# Patient Record
Sex: Female | Born: 1975
Health system: Southern US, Community
[De-identification: ages and names within clinical notes are randomized; demographics above are authoritative.]

## PROBLEM LIST (undated history)

## (undated) DIAGNOSIS — F329 Major depressive disorder, single episode, unspecified: Secondary | ICD-10-CM

## (undated) DIAGNOSIS — T50902A Poisoning by unspecified drugs, medicaments and biological substances, intentional self-harm, initial encounter: Secondary | ICD-10-CM

## (undated) DIAGNOSIS — E785 Hyperlipidemia, unspecified: Secondary | ICD-10-CM

## (undated) DIAGNOSIS — C801 Malignant (primary) neoplasm, unspecified: Secondary | ICD-10-CM

## (undated) DIAGNOSIS — F419 Anxiety disorder, unspecified: Secondary | ICD-10-CM

## (undated) DIAGNOSIS — K219 Gastro-esophageal reflux disease without esophagitis: Secondary | ICD-10-CM

## (undated) DIAGNOSIS — M1990 Unspecified osteoarthritis, unspecified site: Secondary | ICD-10-CM

## (undated) DIAGNOSIS — F32A Depression, unspecified: Secondary | ICD-10-CM

## (undated) DIAGNOSIS — I10 Essential (primary) hypertension: Secondary | ICD-10-CM

## (undated) HISTORY — PX: CHOLECYSTECTOMY: SHX55

## (undated) HISTORY — PX: UPPER GASTROINTESTINAL ENDOSCOPY: SHX188

## (undated) HISTORY — DX: Malignant (primary) neoplasm, unspecified: C80.1

## (undated) HISTORY — DX: Anxiety disorder, unspecified: F41.9

## (undated) HISTORY — DX: Unspecified osteoarthritis, unspecified site: M19.90

## (undated) HISTORY — DX: Gastro-esophageal reflux disease without esophagitis: K21.9

## (undated) HISTORY — DX: Hyperlipidemia, unspecified: E78.5

## (undated) HISTORY — PX: TUBAL LIGATION: SHX77

## (undated) HISTORY — PX: SIGMOIDOSCOPY: SUR1295

---

## 2014-09-16 DIAGNOSIS — I639 Cerebral infarction, unspecified: Secondary | ICD-10-CM

## 2014-09-16 HISTORY — DX: Cerebral infarction, unspecified: I63.9

## 2016-12-18 ENCOUNTER — Other Ambulatory Visit: Payer: Self-pay | Admitting: Internal Medicine

## 2016-12-18 DIAGNOSIS — N6452 Nipple discharge: Secondary | ICD-10-CM

## 2016-12-23 ENCOUNTER — Other Ambulatory Visit: Payer: Self-pay

## 2017-02-06 ENCOUNTER — Ambulatory Visit
Admission: RE | Admit: 2017-02-06 | Discharge: 2017-02-06 | Disposition: A | Discharge status: Home / Self Care | Payer: Medicaid Other | Source: Ambulatory Visit | Attending: Internal Medicine | Admitting: Internal Medicine

## 2017-02-06 ENCOUNTER — Ambulatory Visit
Admission: RE | Admit: 2017-02-06 | Discharge: 2017-02-06 | Disposition: A | Discharge status: Home / Self Care | Payer: Self-pay | Source: Ambulatory Visit | Attending: Internal Medicine | Admitting: Internal Medicine

## 2017-02-06 ENCOUNTER — Other Ambulatory Visit: Payer: Self-pay | Admitting: Internal Medicine

## 2017-02-06 ENCOUNTER — Other Ambulatory Visit: Payer: Self-pay

## 2017-02-06 DIAGNOSIS — N6452 Nipple discharge: Secondary | ICD-10-CM

## 2017-02-06 DIAGNOSIS — N6320 Unspecified lump in the left breast, unspecified quadrant: Secondary | ICD-10-CM

## 2017-02-07 ENCOUNTER — Other Ambulatory Visit: Payer: Self-pay | Admitting: Internal Medicine

## 2017-02-07 DIAGNOSIS — N6320 Unspecified lump in the left breast, unspecified quadrant: Secondary | ICD-10-CM

## 2017-02-11 ENCOUNTER — Ambulatory Visit
Admission: RE | Admit: 2017-02-11 | Discharge: 2017-02-11 | Disposition: A | Discharge status: Home / Self Care | Payer: Medicaid Other | Source: Ambulatory Visit | Attending: Internal Medicine | Admitting: Internal Medicine

## 2017-02-11 DIAGNOSIS — N6320 Unspecified lump in the left breast, unspecified quadrant: Secondary | ICD-10-CM

## 2017-02-28 ENCOUNTER — Emergency Department (HOSPITAL_COMMUNITY): Payer: Medicaid Other

## 2017-02-28 ENCOUNTER — Emergency Department (HOSPITAL_COMMUNITY)
Admission: EM | Admit: 2017-02-28 | Discharge: 2017-02-28 | Disposition: A | Discharge status: Home / Self Care | Payer: Medicaid Other | Attending: Emergency Medicine | Admitting: Emergency Medicine

## 2017-02-28 ENCOUNTER — Encounter (HOSPITAL_COMMUNITY): Payer: Self-pay | Admitting: Emergency Medicine

## 2017-02-28 DIAGNOSIS — I10 Essential (primary) hypertension: Secondary | ICD-10-CM | POA: Diagnosis not present

## 2017-02-28 DIAGNOSIS — R200 Anesthesia of skin: Secondary | ICD-10-CM | POA: Insufficient documentation

## 2017-02-28 DIAGNOSIS — Z7982 Long term (current) use of aspirin: Secondary | ICD-10-CM | POA: Diagnosis not present

## 2017-02-28 DIAGNOSIS — Z79899 Other long term (current) drug therapy: Secondary | ICD-10-CM | POA: Insufficient documentation

## 2017-02-28 HISTORY — DX: Essential (primary) hypertension: I10

## 2017-02-28 LAB — CBC WITH DIFFERENTIAL/PLATELET
Basophils Absolute: 0.1 10*3/uL (ref 0.0–0.1)
Basophils Relative: 0 %
Eosinophils Absolute: 0.4 10*3/uL (ref 0.0–0.7)
Eosinophils Relative: 3 %
HCT: 39.3 % (ref 36.0–46.0)
Hemoglobin: 13.6 g/dL (ref 12.0–15.0)
Lymphocytes Relative: 36 %
Lymphs Abs: 5.1 10*3/uL — ABNORMAL HIGH (ref 0.7–4.0)
MCH: 30.7 pg (ref 26.0–34.0)
MCHC: 34.6 g/dL (ref 30.0–36.0)
MCV: 88.7 fL (ref 78.0–100.0)
Monocytes Absolute: 1 10*3/uL (ref 0.1–1.0)
Monocytes Relative: 7 %
Neutro Abs: 7.8 10*3/uL — ABNORMAL HIGH (ref 1.7–7.7)
Neutrophils Relative %: 54 %
Platelets: 429 10*3/uL — ABNORMAL HIGH (ref 150–400)
RBC: 4.43 MIL/uL (ref 3.87–5.11)
RDW: 14.7 % (ref 11.5–15.5)
WBC: 14.3 10*3/uL — ABNORMAL HIGH (ref 4.0–10.5)

## 2017-02-28 LAB — BASIC METABOLIC PANEL
Anion gap: 9 (ref 5–15)
BUN: 19 mg/dL (ref 6–20)
CO2: 27 mmol/L (ref 22–32)
Calcium: 9.1 mg/dL (ref 8.9–10.3)
Chloride: 102 mmol/L (ref 101–111)
Creatinine, Ser: 1.15 mg/dL — ABNORMAL HIGH (ref 0.44–1.00)
GFR calc Af Amer: >60 mL/min (ref >60)
GFR calc non Af Amer: 58 mL/min — ABNORMAL LOW (ref >60)
Glucose, Bld: 98 mg/dL (ref 65–99)
Potassium: 3.2 mmol/L — ABNORMAL LOW (ref 3.5–5.1)
Sodium: 138 mmol/L (ref 135–145)

## 2017-02-28 NOTE — ED Triage Notes (Signed)
Pt from home with c/o pressure to the back of her head, blurred vision, left arm numbness and tingling in bilateral fingers. Pt states symptoms have been present x 2 weeks. Unremarkable neuro exam; pt appears to have equal sensation and strength in all extremities.

## 2017-02-28 NOTE — ED Provider Notes (Signed)
[facility] DEPT Provider Note    By signing my name below, I, Bea Graff, attest that this documentation has been prepared under the direction and in the presence of Virgel Manifold, MD. Electronically Signed: Bea Graff, ED Scribe. 02/28/17. 5:48 PM.    History   Chief Complaint Chief Complaint  Patient presents with  . Numbness    The history is provided by the patient and medical records. No language interpreter was used.    Alicia Robinson is a 41 y.o. female with PMHx of HTN who presents to the Emergency Department complaining of intermittent left arm tingling that began approximately two weeks ago. She reports associated intermittent numbness of fingers of the bilateral hands, HA, blurred vision and dizziness. She reports it feels as if ants are crawling on BUE. She states the symptoms occur for a few seconds at a time and not always simultaneously. She describes the HA as pressure in the posterior head. She has not taken anything for her symptoms. There are no modifying factors noted. She denies pain in the shoulders or back, fever, chills, nausea, vomiting. 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   Past Medical History:  Diagnosis Date  . Hypertension     There are no active problems to display for this patient.   Past Surgical History:  Procedure Laterality Date  . CHOLECYSTECTOMY    . TUBAL LIGATION      OB History    No data available       Home Medications    Prior to Admission medications   Medication Sig Start Date End Date Taking? Authorizing Provider  aspirin EC 81 MG tablet Take 81 mg by mouth daily.   Yes [provider]  chlorthalidone (HYGROTON) 25 MG tablet Take 25 mg by mouth daily.   Yes [provider]  omeprazole (PRILOSEC) 20 MG capsule Take 20 mg by mouth daily.   Yes [provider]    Family History No family history on file.  Social History Social  History  Substance Use Topics  . Smoking status: Never Smoker  . Smokeless tobacco: Never Used  . Alcohol use Yes     Comment: weekends     Allergies   Patient has no known allergies.   Review of Systems Review of Systems All other systems reviewed and are negative for acute change except as noted in the HPI.   Physical Exam Updated Vital Signs BP 136/77   Pulse 67   Resp 18   SpO2 100%   Physical Exam  Constitutional: She is oriented to person, place, and time. She appears well-developed and well-nourished. No distress.  HENT:  Head: Normocephalic and atraumatic.  Eyes: EOM are normal.  Neck: Normal range of motion. Neck supple.  No nuchal rigidity.  Cardiovascular: Normal rate, regular rhythm and normal heart sounds.   Pulmonary/Chest: Effort normal and breath sounds normal.  Abdominal: Soft. She exhibits no distension. There is no tenderness.  Musculoskeletal: Normal range of motion.  Neurological: She is alert and oriented to person, place, and time. No cranial nerve deficit or sensory deficit. Coordination normal.  Normal finger to nose. No pronator drift.  Skin: Skin is warm and dry.  Psychiatric: She has a normal mood and affect. Judgment normal.  Nursing note and vitals reviewed.    ED Treatments / Results  DIAGNOSTIC STUDIES: Oxygen Saturation is 100% on RA, normal by my interpretation.   COORDINATION OF  CARE: 5:24 PM- Will order labs and CT. Pt verbalizes understanding and agrees to plan.  Medications - No data to display  Labs (all labs ordered are listed, but only abnormal results are displayed) Labs Reviewed  CBC WITH DIFFERENTIAL/PLATELET - Abnormal; Notable for the following:       Result Value   WBC 14.3 (*)    Platelets 429 (*)    Neutro Abs 7.8 (*)    Lymphs Abs 5.1 (*)    All other components within normal limits  BASIC METABOLIC PANEL - Abnormal; Notable for the following:    Potassium 3.2 (*)    Creatinine, Ser 1.15 (*)    GFR  calc non Af Amer 58 (*)    All other components within normal limits    EKG  EKG Interpretation None       Radiology No results found.  Procedures Procedures (including critical care time)  Medications Ordered in ED Medications - No data to display   Initial Impression / Assessment and Plan / ED Course  I have reviewed the triage vital signs and the nursing notes.  Pertinent labs & imaging results that were available during my care of the patient were reviewed by me and considered in my medical decision making (see chart for details).     40yF with intermittent tingling/numbenss in b/l UE. Neuro exam nonfocal. I doubt this is a TIA/CVA or other emergent process.   Final Clinical Impressions(s) / ED Diagnoses   Final diagnoses:  Arm numbness    New Prescriptions New Prescriptions   No medications on file    I personally preformed the services scribed in my presence. The recorded information has been reviewed is accurate. Virgel Manifold, MD.     Virgel Manifold, MD 03/11/17 [phone]

## 2017-02-28 NOTE — Discharge Instructions (Signed)
Your CT of your head today is without any concerning findings. You have a beautiful looking brain. I think the numbness you are having, particularly the "ants crawling" sensation in your L forearm may be from a pinched nerve in your neck. Take NSAIDs (ibuprofen, aleve, naproxen, etc) as needed. Keep your upcoming appointment with your doctor.

## 2017-04-30 ENCOUNTER — Encounter: Payer: Medicaid Other | Admitting: Obstetrics

## 2017-05-01 ENCOUNTER — Encounter: Payer: Medicaid Other | Admitting: Obstetrics

## 2017-07-11 ENCOUNTER — Inpatient Hospital Stay (HOSPITAL_COMMUNITY)
Admission: AD | Admit: 2017-07-11 | Discharge: 2017-07-18 | DRG: 885 | Disposition: A | Discharge status: Home / Self Care | Payer: Medicaid Other | Attending: Psychiatry | Admitting: Psychiatry

## 2017-07-11 ENCOUNTER — Encounter (HOSPITAL_COMMUNITY): Payer: Self-pay | Admitting: *Deleted

## 2017-07-11 DIAGNOSIS — Z915 Personal history of self-harm: Secondary | ICD-10-CM | POA: Diagnosis not present

## 2017-07-11 DIAGNOSIS — E876 Hypokalemia: Secondary | ICD-10-CM | POA: Diagnosis not present

## 2017-07-11 DIAGNOSIS — F419 Anxiety disorder, unspecified: Secondary | ICD-10-CM | POA: Diagnosis present

## 2017-07-11 DIAGNOSIS — I10 Essential (primary) hypertension: Secondary | ICD-10-CM | POA: Diagnosis present

## 2017-07-11 DIAGNOSIS — K219 Gastro-esophageal reflux disease without esophagitis: Secondary | ICD-10-CM | POA: Diagnosis present

## 2017-07-11 DIAGNOSIS — R51 Headache: Secondary | ICD-10-CM | POA: Diagnosis not present

## 2017-07-11 DIAGNOSIS — F323 Major depressive disorder, single episode, severe with psychotic features: Secondary | ICD-10-CM | POA: Diagnosis present

## 2017-07-11 DIAGNOSIS — Z79899 Other long term (current) drug therapy: Secondary | ICD-10-CM

## 2017-07-11 DIAGNOSIS — Z7982 Long term (current) use of aspirin: Secondary | ICD-10-CM | POA: Diagnosis not present

## 2017-07-11 DIAGNOSIS — E785 Hyperlipidemia, unspecified: Secondary | ICD-10-CM | POA: Diagnosis present

## 2017-07-11 DIAGNOSIS — F39 Unspecified mood [affective] disorder: Secondary | ICD-10-CM | POA: Diagnosis not present

## 2017-07-11 DIAGNOSIS — H538 Other visual disturbances: Secondary | ICD-10-CM | POA: Diagnosis not present

## 2017-07-11 DIAGNOSIS — G4700 Insomnia, unspecified: Secondary | ICD-10-CM | POA: Diagnosis present

## 2017-07-11 DIAGNOSIS — F1210 Cannabis abuse, uncomplicated: Secondary | ICD-10-CM | POA: Diagnosis not present

## 2017-07-11 DIAGNOSIS — R450 Nervousness: Secondary | ICD-10-CM | POA: Diagnosis not present

## 2017-07-11 HISTORY — DX: Major depressive disorder, single episode, unspecified: F32.9

## 2017-07-11 HISTORY — DX: Depression, unspecified: F32.A

## 2017-07-11 LAB — COMPREHENSIVE METABOLIC PANEL
ALT: 103 U/L — ABNORMAL HIGH (ref 14–54)
AST: 79 U/L — AB (ref 15–41)
Albumin: 3.7 g/dL (ref 3.5–5.0)
Alkaline Phosphatase: 73 U/L (ref 38–126)
Anion gap: 12 (ref 5–15)
BUN: 17 mg/dL (ref 6–20)
CHLORIDE: 99 mmol/L — AB (ref 101–111)
CO2: 28 mmol/L (ref 22–32)
Calcium: 9.1 mg/dL (ref 8.9–10.3)
Creatinine, Ser: 1.13 mg/dL — ABNORMAL HIGH (ref 0.44–1.00)
GFR, EST NON AFRICAN AMERICAN: 59 mL/min — AB (ref >60)
Glucose, Bld: 118 mg/dL — ABNORMAL HIGH (ref 65–99)
POTASSIUM: 3 mmol/L — AB (ref 3.5–5.1)
SODIUM: 139 mmol/L (ref 135–145)
Total Bilirubin: 0.4 mg/dL (ref 0.3–1.2)
Total Protein: 6.8 g/dL (ref 6.5–8.1)

## 2017-07-11 LAB — LIPID PANEL
CHOLESTEROL: 189 mg/dL (ref 0–200)
HDL: 53 mg/dL (ref >40)
LDL Cholesterol: 99 mg/dL (ref 0–99)
TRIGLYCERIDES: 186 mg/dL — AB (ref <150)
Total CHOL/HDL Ratio: 3.6 RATIO
VLDL: 37 mg/dL (ref 0–40)

## 2017-07-11 LAB — CBC
HEMATOCRIT: 42.4 % (ref 36.0–46.0)
Hemoglobin: 14.5 g/dL (ref 12.0–15.0)
MCH: 30.4 pg (ref 26.0–34.0)
MCHC: 34.2 g/dL (ref 30.0–36.0)
MCV: 88.9 fL (ref 78.0–100.0)
PLATELETS: 553 10*3/uL — AB (ref 150–400)
RBC: 4.77 MIL/uL (ref 3.87–5.11)
RDW: 14.7 % (ref 11.5–15.5)
WBC: 16.2 10*3/uL — AB (ref 4.0–10.5)

## 2017-07-11 LAB — TSH: TSH: 1.374 u[IU]/mL (ref 0.350–4.500)

## 2017-07-11 LAB — HEMOGLOBIN A1C
HEMOGLOBIN A1C: 5.4 % (ref 4.8–5.6)
MEAN PLASMA GLUCOSE: 108.28 mg/dL

## 2017-07-11 LAB — ETHANOL: Alcohol, Ethyl (B): <10 mg/dL (ref <10)

## 2017-07-11 MED ORDER — ASPIRIN EC 81 MG PO TBEC
81.0000 mg | DELAYED_RELEASE_TABLET | Freq: Every day | ORAL | Status: DC
  Administered 2017-07-12 – 2017-07-18 (×7): 81 mg via ORAL
  Filled 2017-07-11 (×11): qty 1

## 2017-07-11 MED ORDER — ATORVASTATIN CALCIUM 20 MG PO TABS
20.0000 mg | ORAL_TABLET | Freq: Every day | ORAL | Status: DC
  Administered 2017-07-11 – 2017-07-17 (×7): 20 mg via ORAL
  Filled 2017-07-11 (×8): qty 1
  Filled 2017-07-11: qty 2

## 2017-07-11 MED ORDER — TRAZODONE HCL 50 MG PO TABS
50.0000 mg | ORAL_TABLET | Freq: Every evening | ORAL | Status: DC | PRN
  Administered 2017-07-11 – 2017-07-17 (×6): 50 mg via ORAL
  Filled 2017-07-11 (×6): qty 1

## 2017-07-11 MED ORDER — HYDROCHLOROTHIAZIDE 25 MG PO TABS
25.0000 mg | ORAL_TABLET | Freq: Every day | ORAL | Status: DC
  Administered 2017-07-12 – 2017-07-18 (×7): 25 mg via ORAL
  Filled 2017-07-11 (×11): qty 1

## 2017-07-11 MED ORDER — PANTOPRAZOLE SODIUM 40 MG PO TBEC
40.0000 mg | DELAYED_RELEASE_TABLET | Freq: Every day | ORAL | Status: DC
  Administered 2017-07-12 – 2017-07-18 (×7): 40 mg via ORAL
  Filled 2017-07-11 (×11): qty 1

## 2017-07-11 MED ORDER — ALUM & MAG HYDROXIDE-SIMETH 200-200-20 MG/5ML PO SUSP: 30.0000 mL | ORAL | Status: DC | PRN

## 2017-07-11 MED ORDER — LAMOTRIGINE 25 MG PO TABS
25.0000 mg | ORAL_TABLET | Freq: Every day | ORAL | Status: DC
  Administered 2017-07-11 – 2017-07-18 (×8): 25 mg via ORAL
  Filled 2017-07-11 (×12): qty 1

## 2017-07-11 MED ORDER — MAGNESIUM HYDROXIDE 400 MG/5ML PO SUSP: 30.0000 mL | Freq: Every day | ORAL | Status: DC | PRN

## 2017-07-11 MED ORDER — ACETAMINOPHEN 325 MG PO TABS
650.0000 mg | ORAL_TABLET | Freq: Four times a day (QID) | ORAL | Status: DC | PRN
  Administered 2017-07-12 – 2017-07-16 (×4): 650 mg via ORAL
  Filled 2017-07-11 (×4): qty 2

## 2017-07-11 MED ORDER — POTASSIUM CHLORIDE CRYS ER 20 MEQ PO TBCR
20.0000 meq | EXTENDED_RELEASE_TABLET | Freq: Two times a day (BID) | ORAL | Status: AC
  Administered 2017-07-11 – 2017-07-13 (×4): 20 meq via ORAL
  Filled 2017-07-11 (×5): qty 1

## 2017-07-11 MED ORDER — BUPROPION HCL ER (XL) 300 MG PO TB24
300.0000 mg | ORAL_TABLET | Freq: Every day | ORAL | Status: DC
  Administered 2017-07-12 – 2017-07-14 (×3): 300 mg via ORAL
  Filled 2017-07-11 (×7): qty 1

## 2017-07-11 MED ORDER — HYDROXYZINE HCL 25 MG PO TABS
25.0000 mg | ORAL_TABLET | Freq: Three times a day (TID) | ORAL | Status: DC | PRN
  Administered 2017-07-11 – 2017-07-16 (×6): 25 mg via ORAL
  Filled 2017-07-11 (×6): qty 1

## 2017-07-11 MED ORDER — ENSURE ENLIVE PO LIQD
237.0000 mL | Freq: Two times a day (BID) | ORAL | Status: DC
  Administered 2017-07-13: 237 mL via ORAL

## 2017-07-11 NOTE — Progress Notes (Signed)
Nursing admission note:  Pt is 41 yr old female walk in today for depression and SI.  She was started on Wellbutrin by PCP in July for depression with slight improvement, but continued to have crying periods and loss of interest in things that she used to enjoy.  States that she feels hopeless, worthless and burden to her family, also difficulty concentrating.  "My head feels like it is spinning with a thousand thoughts."  She was given a prescription for Clonazapam by PCP last week for insomnia. Pt endorses that she smokes Marijuana almost daily to help with anxiety and prevent suicide, "It is probably the only reason why I have not killed myself at this point."  Pt saw PCP today and decision was made to seek inpatient assistance.  Denies AV hallucinations and is able to contract for safety at this time.   Admission assessment and search completed,  Belongings listed and secured.  Treatment plan explained and pt. oriented to unit.

## 2017-07-11 NOTE — H&P (Signed)
Behavioral Health Medical Screening Exam  Alicia Robinson is an 41 y.o. female.  Total Time spent with patient: 45 minutes  Psychiatric Specialty Exam: Physical Exam  Vitals reviewed. Constitutional: She is oriented to person, place, and time. She appears well-developed.  Neck: Normal range of motion.  Cardiovascular: Normal rate and regular rhythm.   Respiratory: Effort normal and breath sounds normal.  Musculoskeletal: Normal range of motion.  Neurological: She is alert and oriented to person, place, and time.  Skin: Skin is warm and dry.    Review of Systems  Cardiovascular: Negative for chest pain, palpitations and orthopnea.       History of hypertension  Neurological: Sensory change: Denies. Seizures: Denies. Headaches: Denies.       History of stroke 2016  Psychiatric/Behavioral: Positive for depression (Worsening depression), hallucinations (States she is hearing something telling her not to talk), substance abuse (Frequent use of marijuana  ) and suicidal ideas (No plan.  Unable to contract for safety). Memory loss: denies. The patient is nervous/anxious and has insomnia.   All other systems reviewed and are negative.   Blood pressure 127/85, pulse 70, temperature 97.8 F (36.6 C), temperature source Oral, resp. rate 16, SpO2 99 %.There is no height or weight on file to calculate BMI.  General Appearance: Casual  Eye Contact:  Good  Speech:  Clear and Coherent and Normal Rate  Volume:  Normal  Mood:  Depressed  Affect:  Depressed and Flat  Thought Process:  Goal Directed  Orientation:  Full (Time, Place, and Person)  Thought Content:  Hallucinations: Auditory  Suicidal Thoughts:  Yes.  without intent/plan  Homicidal Thoughts:  No  Memory:  Immediate;   Good Recent;   Good Remote;   Good  Judgement:  Fair  Insight:  Fair  Psychomotor Activity:  Normal  Concentration: Concentration: Good and Attention Span: Good  Recall:  Good  Fund of Knowledge:Fair  Language:  Good  Akathisia:  No  Handed:  Right  AIMS (if indicated):     Assets:  Communication Skills Desire for Improvement Housing Resilience Social Support  Sleep:       Musculoskeletal: Strength & Muscle Tone: within normal limits Gait & Station: normal Patient leans: N/A  Blood pressure 127/85, pulse 70, temperature 97.8 F (36.6 C), temperature source Oral, resp. rate 16, SpO2 99 %.  Assessment: Alicia Robinson, 41 y.o., female patient presented to [facility] as walk in with complaints of worsening depression, suicidal thoughts with no plan but unable to contract for safety.  Also has complaints auditory hallucination with a voice that is telling her not to talk.  Patient sent to [facility] by her primary physician.   Patient has also had recent medication changes with her PCP starting her on Pristiq 25 mg daily for 7 days and then in crease to 50 mg and Lamictal 25 mg for 7 days then increase to 50 mg daily.  Patient was already taking Wellbutrin XL 300 mg daily.  States that there has been no change with the change in medication.  Restarted her home medication; held Pristiq until after morning H&P assessment.   Patient also recently started Klonopin 0.5 mg Q hs.  Klonopin was also held.  Started Vistaril 25 mg Tid prn anxiety and Trazodone 50 mg Q hs prn insomnia.     Recommendations:  Inpatient psychiatric treatment.  Accepted to [facility]  Based on my evaluation the patient does not appear to have an emergency medical condition.  Rankin, Shuvon, NP 07/11/2017, 4:41 PM
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
## 2017-07-11 NOTE — Tx Team (Signed)
Initial Treatment Plan 07/11/2017 6:06 PM ODILIA DAMICO POE:[id number]    PATIENT STRESSORS: Other: "I feel like a burden to my family."  "I feel hopeless."   PATIENT STRENGTHS: Average or above average intelligence Communication skills General fund of knowledge Supportive family/friends   PATIENT IDENTIFIED PROBLEMS: Suicide Risk  Coping skills for depression  "I feel worthless and hopeless."                 DISCHARGE CRITERIA:  Improved stabilization in mood, thinking, and/or behavior Need for constant or close observation no longer present Reduction of life-threatening or endangering symptoms to within safe limits  PRELIMINARY DISCHARGE PLAN: Return to previous living arrangement  PATIENT/FAMILY INVOLVEMENT: This treatment plan has been presented to and reviewed with the patient, [name].  The patient and family have been given the opportunity to ask questions and make suggestions.  Maudie Flakes, RN 07/11/2017, 6:06 PM

## 2017-07-11 NOTE — Plan of Care (Signed)
Problem: Safety: Goal: Periods of time without injury will increase Outcome: Progressing Pt remains a low fall risk, denies SI at this time.

## 2017-07-12 LAB — RAPID URINE DRUG SCREEN, HOSP PERFORMED
AMPHETAMINES: NOT DETECTED
BENZODIAZEPINES: NOT DETECTED
Barbiturates: NOT DETECTED
COCAINE: NOT DETECTED
OPIATES: NOT DETECTED
TETRAHYDROCANNABINOL: POSITIVE — AB

## 2017-07-12 LAB — URINALYSIS, ROUTINE W REFLEX MICROSCOPIC
BILIRUBIN URINE: NEGATIVE
Glucose, UA: NEGATIVE mg/dL
Hgb urine dipstick: NEGATIVE
Ketones, ur: NEGATIVE mg/dL
Leukocytes, UA: NEGATIVE
NITRITE: NEGATIVE
PH: 5 (ref 5.0–8.0)
Protein, ur: NEGATIVE mg/dL
SPECIFIC GRAVITY, URINE: 1.025 (ref 1.005–1.030)

## 2017-07-12 LAB — PREGNANCY, URINE: PREG TEST UR: NEGATIVE

## 2017-07-12 NOTE — Plan of Care (Signed)
Problem: Activity: Goal: Sleeping patterns will improve Outcome: Progressing Pt slept 6.75 hrs last night.

## 2017-07-12 NOTE — B[facility] Counselor (Signed)
Clinical Social Work Note  At pt request and with written permission, CSW contacted employer to inform of reason she is not at work.  This was appreciated, and a letter at discharge would be helpful.  Selmer Dominion, LCSW 07/12/2017, 5:06 PM

## 2017-07-12 NOTE — H&P (Signed)
Psychiatric Admission Assessment Adult  Patient Identification: Alicia Robinson MRN:  [id number] Date of Evaluation:  07/12/2017 Chief Complaint:  mdd,rec,sev Principal Diagnosis: MDD (major depressive disorder), single episode, severe with psychosis ([place]) Diagnosis:   Patient Active Problem List   Diagnosis Date Noted  . MDD (major depressive disorder), single episode, severe with psychosis ([place]) [F32.3] 07/11/2017   History of Present Illness:Per assessment note- Alicia Robinson is an 41 y.o. female who came to [facility] as a walk in from her PCP's office due to suicidal thoughts and ideations to "just not wake up". Pt is flat and depressed during assessment and states that she has a history of depression but it has been getting worse the past 2 weeks. She states that she has been thinking about suicide more this past week and went to her doctor for medications. She has been trying to get in to see a psychiatrist for a month but has not been successful. She states that [facility] and was prescribed 2 more medications but does not feel any better. Pt denies HI or AVH but states she feels her head and neck start to hurt and she feels like there is something "telling her not to talk or move". Pt has a history of 3 psychiatric inpatient admissions and has had one overdose attempt when she was 14. Her admissions were in [place] where she is from. Pt currently lives with her son and daughter who are 37 and 38. She has a friend for support. Pt is currently employed and is having to take off work because of her symptoms. No legal issues noted. Pt states that she can't contract for safety and would not be safe at home.  On Assessment:Alicia Robinson is awake, alert and oriented. Seen resting in bedroom.  Patient continues to reports depression and anxiety. States feeling safe in the hospital. Denies auditory or visual hallucination and does not appear to be responding to internal  stimuli. Np will discuss medications management with MD. Noted on assessment Pristiq 25 mg and Lamictal  And Klonopin 0.71m  see SRA for medication adjustment. Patient reports she recently started on Lamictal states tolerating medication well, denies nausea  vomiting or rash.  Patient validates the information above assessment  that was provided int he Support, encouragement and reassurance was provided.    Associated Signs/Symptoms: Depression Symptoms:  depressed mood, feelings of worthlessness/guilt, difficulty concentrating, hopelessness, suicidal thoughts without plan, anxiety, (Hypo) Manic Symptoms:  Distractibility, Impulsivity, Irritable Mood, Anxiety Symptoms:  Excessive Worry, Social Anxiety, Psychotic Symptoms:  Hallucinations: None PTSD Symptoms: Avoidance:  Decreased Interest/Participation Total Time spent with patient: 30 minutes  Past Psychiatric History:   Is the patient at risk to self? Yes.    Has the patient been a risk to self in the past 6 months? Yes.    Has the patient been a risk to self within the distant past? Yes.    Is the patient a risk to others? No.  Has the patient been a risk to others in the past 6 months? No.  Has the patient been a risk to others within the distant past? No.   Prior Inpatient Therapy: Prior Inpatient Therapy: Yes Prior Therapy Dates: 3 times [place], Prior Outpatient Therapy: Prior Outpatient Therapy: Yes Prior Therapy Dates: unknown Prior Therapy Facilty/Provider(s): unknown Reason for Treatment: depression Does patient have an ACCT team?: No Does patient have  Intensive In-House Services?  : No Does patient have [facility] services? : No Does patient have P4CC services?: No  Alcohol Screening: 1. How often do you have a drink containing alcohol?: Monthly or less 2. How many drinks containing alcohol do you have on a typical day when you are  drinking?: 1 or 2 3. How often do you have six or more drinks on one occasion?: Never Preliminary Score: 0 9. Have you or someone else been injured as a result of your drinking?: No 10. Has a relative or friend or a doctor or another health worker been concerned about your drinking or suggested you cut down?: No Alcohol Use Disorder Identification Test Final Score (AUDIT): 1 Brief Intervention: AUDIT score less than 7 or less-screening does not suggest unhealthy drinking-brief intervention not indicated Substance Abuse History in the last 12 months:  Yes.   Consequences of Substance Abuse: NA Previous Psychotropic Medications: YES Psychological Evaluations: YES Past Medical History:  Past Medical History:  Diagnosis Date  . Depression   . Hypertension     Past Surgical History:  Procedure Laterality Date  . CHOLECYSTECTOMY    . TUBAL LIGATION     Family History: History reviewed. No pertinent family history. Family Psychiatric  History: Tobacco Screening: Have you used any form of tobacco in the last 30 days? (Cigarettes, Smokeless Tobacco, Cigars, and/or Pipes): No Social History:  History  Alcohol Use  . Yes    Comment: weekends     History  Drug Use  . Types: Marijuana    Additional Social History: Marital status: Single    History of alcohol / drug use?: Yes Name of Substance 1: Marijuana  1 - Age of First Use: UNKN 1 - Amount (size/oz): UNKN 1 - Frequency: daily  1 - Duration: UNKN 1 - Last Use / Amount: today                   Allergies:  No Known Allergies Lab Results:  Results for orders placed or performed during the hospital encounter of 07/11/17 (from the past 48 hour(s))  Urinalysis, Routine w reflex microscopic     Status: None   Collection Time: 07/11/17  6:02 PM  Result Value Ref Range   Color, Urine YELLOW YELLOW   APPearance CLEAR CLEAR   Specific Gravity, Urine 1.025 1.005 - 1.030   pH 5.0 5.0 - 8.0   Glucose, UA NEGATIVE NEGATIVE  mg/dL   Hgb urine dipstick NEGATIVE NEGATIVE   Bilirubin Urine NEGATIVE NEGATIVE   Ketones, ur NEGATIVE NEGATIVE mg/dL   Protein, ur NEGATIVE NEGATIVE mg/dL   Nitrite NEGATIVE NEGATIVE   Leukocytes, UA NEGATIVE NEGATIVE    Comment: Performed at [facility], [place] [address] [place], [place] [address]  Pregnancy, urine     Status: None   Collection Time: 07/11/17  6:02 PM  Result Value Ref Range   Preg Test, Ur NEGATIVE NEGATIVE    Comment:        THE SENSITIVITY OF THIS METHODOLOGY IS >20 mIU/mL. Performed at [facility], [place] [address], [place], [place] [address]   Urine rapid drug screen (hosp performed)not at [facility]     Status: Abnormal   Collection Time: 07/11/17  6:02 PM  Result Value Ref Range   Opiates NONE DETECTED NONE DETECTED   Cocaine NONE DETECTED NONE DETECTED   Benzodiazepines NONE DETECTED NONE DETECTED   Amphetamines NONE DETECTED NONE DETECTED   Tetrahydrocannabinol POSITIVE (A) NONE DETECTED  Barbiturates NONE DETECTED NONE DETECTED    Comment:        DRUG SCREEN FOR MEDICAL PURPOSES ONLY.  IF CONFIRMATION IS NEEDED FOR ANY PURPOSE, NOTIFY LAB WITHIN 5 DAYS.        LOWEST DETECTABLE LIMITS FOR URINE DRUG SCREEN Drug Class       Cutoff (ng/mL) Amphetamine      1000 Barbiturate      200 Benzodiazepine   242 Tricyclics       683 Opiates          300 Cocaine          300 THC              50 Performed at [facility], [place] [address] [place], [place] [address]   CBC     Status: Abnormal   Collection Time: 07/11/17  6:28 PM  Result Value Ref Range   WBC 16.2 (H) 4.0 - 10.5 K/uL   RBC 4.77 3.87 - 5.11 MIL/uL   Hemoglobin 14.5 12.0 - 15.0 g/dL   HCT 42.4 36.0 - 46.0 %   MCV 88.9 78.0 - 100.0 fL   MCH 30.4 26.0 - 34.0 pg   MCHC 34.2 30.0 - 36.0 g/dL   RDW 14.7 11.5 - 15.5 %   Platelets 553 (H) 150 - 400 K/uL    Comment: Performed at [facility], [place] [address]  [place], [place] [address]  Comprehensive metabolic panel     Status: Abnormal   Collection Time: 07/11/17  6:28 PM  Result Value Ref Range   Sodium 139 135 - 145 mmol/L   Potassium 3.0 (L) 3.5 - 5.1 mmol/L    Comment: MODERATE HEMOLYSIS   Chloride 99 (L) 101 - 111 mmol/L   CO2 28 22 - 32 mmol/L   Glucose, Bld 118 (H) 65 - 99 mg/dL   BUN 17 6 - 20 mg/dL   Creatinine, Ser 1.13 (H) 0.44 - 1.00 mg/dL   Calcium 9.1 8.9 - 10.3 mg/dL   Total Protein 6.8 6.5 - 8.1 g/dL   Albumin 3.7 3.5 - 5.0 g/dL   AST 79 (H) 15 - 41 U/L   ALT 103 (H) 14 - 54 U/L   Alkaline Phosphatase 73 38 - 126 U/L   Total Bilirubin 0.4 0.3 - 1.2 mg/dL   GFR calc non Af Amer 59 (L) >60 mL/min   GFR calc Af Amer >60 >60 mL/min    Comment: (NOTE) The eGFR has been calculated using the CKD EPI equation. This calculation has not been validated in all clinical situations. eGFR's persistently <60 mL/min signify possible Chronic Kidney Disease.    Anion gap 12 5 - 15    Comment: Performed at [facility], [place] [address] [place], [place] [address]  Hemoglobin A1c     Status: None   Collection Time: 07/11/17  6:28 PM  Result Value Ref Range   Hgb A1c MFr Bld 5.4 4.8 - 5.6 %    Comment: (NOTE) Pre diabetes:          5.7%-6.4% Diabetes:              >6.4% Glycemic control for   <7.0% adults with diabetes    Mean Plasma Glucose 108.28 mg/dL    Comment: Performed at [place] [address], [place], [place] [address]  Ethanol     Status: None   Collection Time: 07/11/17  6:28 PM  Result Value Ref  Range   Alcohol, Ethyl (B) <10 <10 mg/dL    Comment:        LOWEST DETECTABLE LIMIT FOR SERUM ALCOHOL IS 10 mg/dL FOR MEDICAL PURPOSES ONLY Performed at [place] [address] [place], [place] [address]   Lipid panel     Status: Abnormal   Collection Time: 07/11/17  6:28 PM  Result Value Ref Range   Cholesterol 189 0 - 200 mg/dL   Triglycerides 186 (H) <150 mg/dL    HDL 53 >40 mg/dL   Total CHOL/HDL Ratio 3.6 RATIO   VLDL 37 0 - 40 mg/dL   LDL Cholesterol 99 0 - 99 mg/dL    Comment:        Total Cholesterol/HDL:CHD Risk Coronary Heart Disease Risk Table                     Men   Women  1/2 Average Risk   3.4   3.3  Average Risk       5.0   4.4  2 X Average Risk   9.6   7.1  3 X Average Risk  23.4   11.0        Use the calculated Patient Ratio above and the CHD Risk Table to determine the patient's CHD Risk.        ATP III CLASSIFICATION (LDL):  <100     mg/dL   Optimal  100-129  mg/dL   Near or Above                    Optimal  130-159  mg/dL   Borderline  160-189  mg/dL   High  >190     mg/dL   Very High Performed at [place] [address] [place], [place] [address]   TSH     Status: None   Collection Time: 07/11/17  6:28 PM  Result Value Ref Range   TSH 1.374 0.350 - 4.500 uIU/mL    Comment: Performed by a 3rd Generation assay with a functional sensitivity of <=0.01 uIU/mL. Performed at [facility], [place] [address], [place], [place] [address]     Blood Alcohol level:  Lab Results  Component Value Date   ETH <10 73/42/8768    Metabolic Disorder Labs:  Lab Results  Component Value Date   HGBA1C 5.4 07/11/2017   MPG 108.28 07/11/2017   No results found for: PROLACTIN Lab Results  Component Value Date   CHOL 189 07/11/2017   TRIG 186 (H) 07/11/2017   HDL 53 07/11/2017   CHOLHDL 3.6 07/11/2017   VLDL 37 07/11/2017   LDLCALC 99 07/11/2017    Current Medications: Current Facility-Administered Medications  Medication Dose Route Frequency Provider Last Rate Last Dose  . acetaminophen (TYLENOL) tablet 650 mg  650 mg Oral Q6H PRN Rankin, Shuvon B, NP   650 mg at 07/12/17 0848  . alum & mag hydroxide-simeth (MAALOX/MYLANTA) 200-200-20 MG/5ML suspension 30 mL  30 mL Oral Q4H PRN Rankin, Shuvon B, NP      . aspirin EC tablet 81 mg  81 mg Oral Daily Rankin, Shuvon B, NP   81 mg at 07/12/17 0845   . atorvastatin (LIPITOR) tablet 20 mg  20 mg Oral q1800 Rankin, Shuvon B, NP   20 mg at 07/11/17 1856  . buPROPion (WELLBUTRIN XL) 24 hr tablet 300 mg  300 mg Oral Daily Rankin, Shuvon B, NP   300 mg at 07/12/17  0846  . feeding supplement (ENSURE ENLIVE) (ENSURE ENLIVE) liquid 237 mL  237 mL Oral BID BM [place], Fernando A, MD      . hydrochlorothiazide (HYDRODIURIL) tablet 25 mg  25 mg Oral Daily Rankin, Shuvon B, NP   25 mg at 07/12/17 0846  . hydrOXYzine (ATARAX/VISTARIL) tablet 25 mg  25 mg Oral TID PRN Rankin, Shuvon B, NP   25 mg at 07/11/17 2115  . lamoTRIgine (LAMICTAL) tablet 25 mg  25 mg Oral Daily Rankin, Shuvon B, NP   25 mg at 07/12/17 0845  . magnesium hydroxide (MILK OF MAGNESIA) suspension 30 mL  30 mL Oral Daily PRN Rankin, Shuvon B, NP      . pantoprazole (PROTONIX) EC tablet 40 mg  40 mg Oral Daily Rankin, Shuvon B, NP   40 mg at 07/12/17 0846  . potassium chloride SA (K-DUR,KLOR-CON) CR tablet 20 mEq  20 mEq Oral BID Laverle Hobby, PA-C   20 mEq at 07/12/17 0845  . traZODone (DESYREL) tablet 50 mg  50 mg Oral QHS PRN Rankin, Shuvon B, NP   50 mg at 07/11/17 2115   PTA Medications: Prescriptions Prior to Admission  Medication Sig Dispense Refill Last Dose  . aspirin EC 81 MG tablet Take 81 mg by mouth daily.   02/28/2017 at Unknown time  . chlorthalidone (HYGROTON) 25 MG tablet Take 25 mg by mouth daily.   02/28/2017 at Unknown time  . omeprazole (PRILOSEC) 20 MG capsule Take 20 mg by mouth daily.   02/28/2017 at Unknown time    Musculoskeletal: Strength & Muscle Tone: within normal limits Gait & Station: normal Patient leans: N/A  Psychiatric Specialty Exam: Physical Exam  Vitals reviewed. Constitutional: She appears well-developed.  Cardiovascular: Normal rate.   Neurological: She is alert.  Psychiatric: She has a normal mood and affect. Her behavior is normal.    Review of Systems  Psychiatric/Behavioral: Positive for depression and suicidal ideas. The patient  is nervous/anxious and has insomnia.     Blood pressure 106/78, pulse 85, temperature 98.1 F (36.7 C), temperature source Oral, resp. rate 18, height _0  (1.778 m), weight 88.5 kg (195 lb), SpO2 100 %.Body mass index is 27.98 kg/m.  General Appearance: Casual  Eye Contact:  Fair  Speech:  Clear and Coherent  Volume:  Normal  Mood:  Depressed and Dysphoric  Affect:  Congruent  Thought Process:  Coherent  Orientation:  Full (Time, Place, and Person)  Thought Content:  Hallucinations: None  Suicidal Thoughts:  Yes.  with intent/plan  Homicidal Thoughts:  No  Memory:  Immediate;   Fair Remote;   Fair  Judgement:  Fair  Insight:  Present  Psychomotor Activity:  Restlessness  Concentration:  Concentration: Fair  Recall:  [employer] of Knowledge:  Fair  Language:  Fair  Akathisia:  No  Handed:  Right  AIMS (if indicated):     Assets:  Desire for Improvement Resilience Social Support  ADL's:  Intact  Cognition:  WNL  Sleep:  Number of Hours: 6.75     Treatment Plan Summary: Daily contact with patient to assess and evaluate symptoms and progress in treatment and Medication management   -See SRA for medication adjustment  Continue with Wellbutrin 300 mg and Lamictal 25 mg  for mood stabilization. Continue with Trazodone 50 mg for insomnia  Will continue to monitor vitals ,medication compliance and treatment side effects while patient is here.  Reviewed labs: Triglycerides elevated, potassium 3.0- started on K-DuR ,  BAL - , UDS - positive THC CSW will start working on disposition.  Patient to participate in therapeutic milieu   Observation Level/Precautions:  15 minute checks  Laboratory:  CBC HbAIC UDS UA  Psychotherapy:  individual and group session  Medications:  See SRA by MD  Consultations:  Psychiatry   Discharge Concerns:  Safety, stabilization, and risk of access to medication and medication stabilization   Estimated LOS: 5-7 days  Other:     Physician  Treatment Plan for Primary Diagnosis: MDD (major depressive disorder), single episode, severe with psychosis ([place]) Long Term Goal(s): Improvement in symptoms so as ready for discharge  Short Term Goals: Ability to identify changes in lifestyle to reduce recurrence of condition will improve, Ability to disclose and discuss suicidal ideas, Ability to identify and develop effective coping behaviors will improve and Compliance with prescribed medications will improve  Physician Treatment Plan for Secondary Diagnosis: Principal Problem:   MDD (major depressive disorder), single episode, severe with psychosis ([place])  Long Term Goal(s): Improvement in symptoms so as ready for discharge  Short Term Goals: Ability to identify changes in lifestyle to reduce recurrence of condition will improve, Ability to demonstrate self-control will improve, Ability to maintain clinical measurements within normal limits will improve and Ability to identify triggers associated with substance abuse/mental health issues will improve  I certify that inpatient services furnished can reasonably be expected to improve the patient's condition.    D[place]ill [place], NP 10/27/201810:20 AM

## 2017-07-12 NOTE — Progress Notes (Signed)
[facility] Group Notes:  (Nursing/MHT/Case Management/Adjunct)  Date:  07/12/2017  Time:  9:21 PM  Type of Therapy:  Psychoeducational Skills  Participation Level:  Minimal  Participation Quality:  Attentive  Affect:  Flat  Cognitive:  Appropriate  Insight:  Appropriate  Engagement in Group:  Engaged  Modes of Intervention:  Education  Summary of Progress/Problems: Patient states that she had a bad start to her day but that it ended well. She attended meals in the cafeteria as a means of improving her mood. As a theme for the day, her coping skill will be to work on word searches.   Archie Balboa S 07/12/2017, 9:21 PM

## 2017-07-12 NOTE — Progress Notes (Addendum)
D Patient has spent most of the day resting in her bed.A  She completed her daily assessment first thing this morning and on it she wrote she has experienced SI..she says she has " these thoughts all of the time" and shrugges her shoulders. She rated her depression, hopelessness and anxiety " 8/10/6", respectively. R Safety is in place and poc cont. Pt able to verbally contract with this writer to not hurt herself. Safety in place

## 2017-07-12 NOTE — Progress Notes (Signed)
DATA ACTION RESPONSE  Objective- Pt. is visible in the dayroom, seen doing a crossword puzzle. Presents with a depressed/sad affect and mood. Pt states she had a good visit this evening with her son. Pt states she is not in a rush to get d/c. Pt is guarded with interaction.  Subjective- Denies having any SI/HI/AVH/Pain at this time. Is cooperative and remains safe on the unit.  1:1 interaction in private to establish rapport. Encouragement, education, & support given from staff.PRN Vistaril and Trazodone requested and will re-eval accordingly.   Safety maintained with Q 15 checks. Continue with POC.

## 2017-07-12 NOTE — Progress Notes (Signed)
  DATA ACTION RESPONSE  Objective- Pt. is visible in the room, seen doing a crossword puzzle.Presents with a depressed/sad     affect and mood. Pt states she could not identify any triggers causing her to feel depressed. Pt potassium resulted this evening with a value of 3.0. Provider on call [place], [place] notified. See MAR.  Subjective- Denies having any SI/HI/AVH/Pain at this time. Is cooperative and remains safe on the unit.  1:1 interaction in private to establish rapport. Encouragement, education, & support given from staff.  PRN Vistaril and Trazodone requested and will re-eval accordingly.   Safety maintained with Q 15 checks. Continue with POC.

## 2017-07-12 NOTE — [facility] Group Notes (Signed)
[facility] LCSW Group Therapy Note  Date/Time:    07/12/2017 10:00-11:00AM  Type of Therapy and Topic:  Group Therapy:  Healthy vs Unhealthy Coping Skills  Participation Level:  Active   Description of Group:  The focus of this group was to determine what unhealthy coping techniques typically are used by group members and what healthy coping techniques would be helpful in coping with various problems. Patients were guided in becoming aware of the differences between healthy and unhealthy coping techniques.  Patients were asked to identify 1-2 healthy coping skills they would like to learn to use more effectively, and many mentioned meditation, breathing, and relaxation.  These were explained, samples demonstrated, and resources shared for how to learn more at discharge.   At the end of group, additional ideas of healthy coping skills were shared in a fun exercise.  Therapeutic Goals 1. Patients learned that coping is what human beings do all day long to deal with various situations in their lives 2. Patients defined and discussed healthy vs unhealthy coping techniques 3. Patients identified their preferred coping techniques and identified whether these were healthy or unhealthy 4. Patients determined 1-2 healthy coping skills they would like to become more familiar with and use more often, and practiced a few meditations 5. Patients provided support and ideas to each other  Summary of Patient Progress: During group, patient expressed herself freely.   Therapeutic Modalities Cognitive Behavioral Therapy Motivational Interviewing   [place], LCSW 07/12/2017, 12:00pm
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
## 2017-07-13 DIAGNOSIS — E876 Hypokalemia: Secondary | ICD-10-CM

## 2017-07-13 DIAGNOSIS — F1210 Cannabis abuse, uncomplicated: Secondary | ICD-10-CM

## 2017-07-13 DIAGNOSIS — R450 Nervousness: Secondary | ICD-10-CM

## 2017-07-13 DIAGNOSIS — I10 Essential (primary) hypertension: Secondary | ICD-10-CM | POA: Diagnosis present

## 2017-07-13 DIAGNOSIS — F323 Major depressive disorder, single episode, severe with psychotic features: Principal | ICD-10-CM

## 2017-07-13 DIAGNOSIS — F419 Anxiety disorder, unspecified: Secondary | ICD-10-CM

## 2017-07-13 NOTE — BHH Group Notes (Signed)
[facility] LCSW Group Therapy Note  Date/Time:  07/13/2017 10:00-11:00AM  Type of Therapy and Topic:  Group Therapy:  Healthy and Unhealthy Supports  Participation Level:  Active   Description of Group:  Patients in this group were introduced to the idea of adding a variety of healthy supports to address the various needs in their lives.  Patients identified and described healthy supports versus unhealthy supports in general, then gave examples of each in their own lives.   They discussed what additional healthy supports could be helpful in their recovery and wellness after discharge in order to prevent future hospitalizations.   An emphasis was placed on using counseling to further understanding and planning on what to do about unhealthy people in their lives.  They also worked as a group on developing a specific plan for several patients to deal with unhealthy supports through [place], psychoeducation with loved ones, and even termination of relationships.   Therapeutic Goals:   1)  discuss importance of adding supports to stay well once out of the hospital  2)  compare healthy versus unhealthy supports and identify some examples of each  3)  generate ideas and descriptions of healthy supports that can be added  4)  offer mutual support about how to address unhealthy supports   Summary of Patient Progress:  The patient shared that the current unhealthy supports available in her life are people who are currently in her life like her boyfriend, while the current healthy supports are daughters.  The patient expressed a willingness to add therapy to help in her recovery journey.   Therapeutic Modalities:   Motivational Interviewing Brief Solution-Focused Therapy  Selmer Dominion, LCSW 07/13/2017, 11:00AM

## 2017-07-13 NOTE — Progress Notes (Signed)
D Issabella spent most of the early morning sleeping. Despite this writer's encouragement, pt refused to wake up and take her scheduled meds for 0800. A She did get OOB at 1130 and completed her daily assessment. On this , she wrote she denied SI today and rated her depression, hopelessness and anxiety  " 5/5/4",  R Safety in place. Patient unwilling to disclose her thoughts at this time.

## 2017-07-13 NOTE — Progress Notes (Signed)
Nutrition Brief Note  Patient identified on the Malnutrition Screening Tool (MST) Report  Wt Readings from Last 15 Encounters:  07/11/17 195 lb (88.5 kg)    Body mass index is 27.98 kg/m. Patient meets criteria for overweight based on current BMI.   Labs and medications reviewed.   No nutrition interventions warranted at this time. If nutrition issues arise, please consult RD.   Clayton Bibles, MS, RD, [place] Dietitian Pager: [phone] After Hours Pager: [phone]

## 2017-07-13 NOTE — Progress Notes (Signed)
[facility] Group Notes:  (Nursing/MHT/Case Management/Adjunct)  Date:  07/13/2017  Time:  11:23 PM  Type of Therapy:  Psychoeducational Skills  Participation Level:  Active  Participation Quality:  Appropriate  Affect:  Appropriate  Cognitive:  Appropriate  Insight:  Good  Engagement in Group:  Improving  Modes of Intervention:  Education  Summary of Progress/Problems: Patient states that she had a bad start to her day but that it ended well. She explained that she is feeling better overall despite having felt light headed at dinner and having an "anxiety attack". Her family visit went well this evening. As for the theme of the day, her support system will be comprised of her daughter and family.   Archie Balboa S 07/13/2017, 11:23 PM

## 2017-07-13 NOTE — Progress Notes (Signed)
[facility] MD Progress Note  07/13/2017 2:52 PM Alicia Robinson  MRN:  [id number]   Subjective:  Patient reports that she is feeling better today. She is happy with the medications so far. She deneis any SI/HI/AVH. She reports depression still and rates it at 5/10 and her anxiety at 5/10, but at home on a regular basis her depression is 10/10 and so is the anxiety. She is also requesting assistance with establishing a psychiatrist.  Objective: Patient's chart and findings reviewed and discussed with treatment team. Patient is pleasant and cooperative. She has been in the day room some and has attended groups. Will continue her current medications and monitor. Will have Potassium rechecked in the morning.  Principal Problem: MDD (major depressive disorder), single episode, severe with psychosis ([place]) Diagnosis:   Patient Active Problem List   Diagnosis Date Noted  . MDD (major depressive disorder), single episode, severe with psychosis ([place]) [F32.3] 07/11/2017   Total Time spent with patient: 25 minutes  Past Psychiatric History: See H&P  Past Medical History:  Past Medical History:  Diagnosis Date  . Depression   . Hypertension     Past Surgical History:  Procedure Laterality Date  . CHOLECYSTECTOMY    . TUBAL LIGATION     Family History: History reviewed. No pertinent family history. Family Psychiatric  History: See H&P Social History:  History  Alcohol Use  . Yes    Comment: weekends     History  Drug Use  . Types: Marijuana    Social History   Social History  . Marital status: Single    Spouse name: N/A  . Number of children: N/A  . Years of education: N/A   Social History Main Topics  . Smoking status: Never Smoker  . Smokeless tobacco: Never Used  . Alcohol use Yes     Comment: weekends  . Drug use: Yes    Types: Marijuana  . Sexual activity: Yes   Other Topics Concern  . None   Social History Narrative  . None   Additional Social History:    History of  alcohol / drug use?: Yes Name of Substance 1: Marijuana  1 - Age of First Use: UNKN 1 - Amount (size/oz): UNKN 1 - Frequency: daily  1 - Duration: UNKN 1 - Last Use / Amount: today                   Sleep: Good  Appetite:  Good  Current Medications: Current Facility-Administered Medications  Medication Dose Route Frequency Provider Last Rate Last Dose  . acetaminophen (TYLENOL) tablet 650 mg  650 mg Oral Q6H PRN Rankin, Shuvon B, NP   650 mg at 07/12/17 0848  . alum & mag hydroxide-simeth (MAALOX/MYLANTA) 200-200-20 MG/5ML suspension 30 mL  30 mL Oral Q4H PRN Rankin, Shuvon B, NP      . aspirin EC tablet 81 mg  81 mg Oral Daily Rankin, Shuvon B, NP   81 mg at 07/13/17 1207  . atorvastatin (LIPITOR) tablet 20 mg  20 mg Oral q1800 Rankin, Shuvon B, NP   20 mg at 07/12/17 1830  . buPROPion (WELLBUTRIN XL) 24 hr tablet 300 mg  300 mg Oral Daily Rankin, Shuvon B, NP   300 mg at 07/13/17 1207  . feeding supplement (ENSURE ENLIVE) (ENSURE ENLIVE) liquid 237 mL  237 mL Oral BID BM Cobos, Fernando A, MD      . hydrochlorothiazide (HYDRODIURIL) tablet 25 mg  25 mg Oral Daily Rankin,  Shuvon B, NP   25 mg at 07/13/17 1208  . hydrOXYzine (ATARAX/VISTARIL) tablet 25 mg  25 mg Oral TID PRN Rankin, Shuvon B, NP   25 mg at 07/12/17 2247  . lamoTRIgine (LAMICTAL) tablet 25 mg  25 mg Oral Daily Rankin, Shuvon B, NP   25 mg at 07/13/17 1207  . magnesium hydroxide (MILK OF MAGNESIA) suspension 30 mL  30 mL Oral Daily PRN Rankin, Shuvon B, NP      . pantoprazole (PROTONIX) EC tablet 40 mg  40 mg Oral Daily Rankin, Shuvon B, NP   40 mg at 07/13/17 1206  . traZODone (DESYREL) tablet 50 mg  50 mg Oral QHS PRN Rankin, Shuvon B, NP   50 mg at 07/12/17 2247    Lab Results:  Results for orders placed or performed during the hospital encounter of 07/11/17 (from the past 48 hour(s))  Urinalysis, Routine w reflex microscopic     Status: None   Collection Time: 07/11/17  6:02 PM  Result Value Ref Range    Color, Urine YELLOW YELLOW   APPearance CLEAR CLEAR   Specific Gravity, Urine 1.025 1.005 - 1.030   pH 5.0 5.0 - 8.0   Glucose, UA NEGATIVE NEGATIVE mg/dL   Hgb urine dipstick NEGATIVE NEGATIVE   Bilirubin Urine NEGATIVE NEGATIVE   Ketones, ur NEGATIVE NEGATIVE mg/dL   Protein, ur NEGATIVE NEGATIVE mg/dL   Nitrite NEGATIVE NEGATIVE   Leukocytes, UA NEGATIVE NEGATIVE    Comment: Performed at [facility], [place] [address], [place], [place] [address]  Pregnancy, urine     Status: None   Collection Time: 07/11/17  6:02 PM  Result Value Ref Range   Preg Test, Ur NEGATIVE NEGATIVE    Comment:        THE SENSITIVITY OF THIS METHODOLOGY IS >20 mIU/mL. Performed at [facility], [place] [address], [place], [place] [address]   Urine rapid drug screen (hosp performed)not at [facility]     Status: Abnormal   Collection Time: 07/11/17  6:02 PM  Result Value Ref Range   Opiates NONE DETECTED NONE DETECTED   Cocaine NONE DETECTED NONE DETECTED   Benzodiazepines NONE DETECTED NONE DETECTED   Amphetamines NONE DETECTED NONE DETECTED   Tetrahydrocannabinol POSITIVE (A) NONE DETECTED   Barbiturates NONE DETECTED NONE DETECTED    Comment:        DRUG SCREEN FOR MEDICAL PURPOSES ONLY.  IF CONFIRMATION IS NEEDED FOR ANY PURPOSE, NOTIFY LAB WITHIN 5 DAYS.        LOWEST DETECTABLE LIMITS FOR URINE DRUG SCREEN Drug Class       Cutoff (ng/mL) Amphetamine      1000 Barbiturate      200 Benzodiazepine   915 Tricyclics       056 Opiates          300 Cocaine          300 THC              50 Performed at [facility], [place] [address] [place] [place], [place] [address]   CBC     Status: Abnormal   Collection Time: 07/11/17  6:28 PM  Result Value Ref Range   WBC 16.2 (H) 4.0 - 10.5 K/uL   RBC 4.77 3.87 - 5.11 MIL/uL   Hemoglobin 14.5 12.0 - 15.0 g/dL   HCT 42.4 36.0 - 46.0 %   MCV 88.9 78.0 - 100.0 fL   MCH 30.4 26.0 - 34.0 pg  MCHC 34.2 30.0 - 36.0  g/dL   RDW 14.7 11.5 - 15.5 %   Platelets 553 (H) 150 - 400 K/uL    Comment: Performed at [facility], [place] [address] [place], [place] [address]  Comprehensive metabolic panel     Status: Abnormal   Collection Time: 07/11/17  6:28 PM  Result Value Ref Range   Sodium 139 135 - 145 mmol/L   Potassium 3.0 (L) 3.5 - 5.1 mmol/L    Comment: MODERATE HEMOLYSIS   Chloride 99 (L) 101 - 111 mmol/L   CO2 28 22 - 32 mmol/L   Glucose, Bld 118 (H) 65 - 99 mg/dL   BUN 17 6 - 20 mg/dL   Creatinine, Ser 1.13 (H) 0.44 - 1.00 mg/dL   Calcium 9.1 8.9 - 10.3 mg/dL   Total Protein 6.8 6.5 - 8.1 g/dL   Albumin 3.7 3.5 - 5.0 g/dL   AST 79 (H) 15 - 41 U/L   ALT 103 (H) 14 - 54 U/L   Alkaline Phosphatase 73 38 - 126 U/L   Total Bilirubin 0.4 0.3 - 1.2 mg/dL   GFR calc non Af Amer 59 (L) >60 mL/min   GFR calc Af Amer >60 >60 mL/min    Comment: (NOTE) The eGFR has been calculated using the CKD EPI equation. This calculation has not been validated in all clinical situations. eGFR's persistently <60 mL/min signify possible Chronic Kidney Disease.    Anion gap 12 5 - 15    Comment: Performed at [facility], [place] [address] [place], [place] [address]  Hemoglobin A1c     Status: None   Collection Time: 07/11/17  6:28 PM  Result Value Ref Range   Hgb A1c MFr Bld 5.4 4.8 - 5.6 %    Comment: (NOTE) Pre diabetes:          5.7%-6.4% Diabetes:              >6.4% Glycemic control for   <7.0% adults with diabetes    Mean Plasma Glucose 108.28 mg/dL    Comment: Performed at [place] [address] [place], [place] [address]  Ethanol     Status: None   Collection Time: 07/11/17  6:28 PM  Result Value Ref Range   Alcohol, Ethyl (B) <10 <10 mg/dL    Comment:        LOWEST DETECTABLE LIMIT FOR SERUM ALCOHOL IS 10 mg/dL FOR MEDICAL PURPOSES ONLY Performed at [facility], [place] [address] [place],  [address]   Lipid panel      Status: Abnormal   Collection Time: 07/11/17  6:28 PM  Result Value Ref Range   Cholesterol 189 0 - 200 mg/dL   Triglycerides 186 (H) <150 mg/dL   HDL 53 >40 mg/dL   Total CHOL/HDL Ratio 3.6 RATIO   VLDL 37 0 - 40 mg/dL   LDL Cholesterol 99 0 - 99 mg/dL    Comment:        Total Cholesterol/HDL:CHD Risk Coronary Heart Disease Risk Table                     Men   Women  1/2 Average Risk   3.4   3.3  Average Risk       5.0   4.4  2 X Average Risk   9.6   7.1  3 X Average Risk  23.4   11.0  Use the calculated Patient Ratio above and the CHD Risk Table to determine the patient's CHD Risk.        ATP III CLASSIFICATION (LDL):  <100     mg/dL   Optimal  100-129  mg/dL   Near or Above                    Optimal  130-159  mg/dL   Borderline  160-189  mg/dL   High  >190     mg/dL   Very High Performed at [place] [address] [place], [place] [address]   TSH     Status: None   Collection Time: 07/11/17  6:28 PM  Result Value Ref Range   TSH 1.374 0.350 - 4.500 uIU/mL    Comment: Performed by a 3rd Generation assay with a functional sensitivity of <=0.01 uIU/mL. Performed at [facility], [place] [address] [place], [place] [address]     Blood Alcohol level:  Lab Results  Component Value Date   ETH <10 02/72/5366    Metabolic Disorder Labs: Lab Results  Component Value Date   HGBA1C 5.4 07/11/2017   MPG 108.28 07/11/2017   No results found for: PROLACTIN Lab Results  Component Value Date   CHOL 189 07/11/2017   TRIG 186 (H) 07/11/2017   HDL 53 07/11/2017   CHOLHDL 3.6 07/11/2017   VLDL 37 07/11/2017   LDLCALC 99 07/11/2017    Physical Findings: AIMS: Facial and Oral Movements Muscles of Facial Expression: None, normal Lips and Perioral Area: None, normal Jaw: None, normal Tongue: None, normal,Extremity Movements Upper (arms, wrists, hands, fingers): None, normal Lower (legs, knees, ankles, toes): None, normal, Trunk  Movements Neck, shoulders, hips: None, normal, Overall Severity Severity of abnormal movements (highest score from questions above): None, normal Incapacitation due to abnormal movements: None, normal Patient's awareness of abnormal movements (rate only patient's report): No Awareness, Dental Status Current problems with teeth and/or dentures?: No Does patient usually wear dentures?: No  CIWA:    COWS:     Musculoskeletal: Strength & Muscle Tone: within normal limits Gait & Station: normal Patient leans: N/A  Psychiatric Specialty Exam: Physical Exam  Nursing note and vitals reviewed. Constitutional: She is oriented to person, place, and time. She appears well-developed and well-nourished.  Cardiovascular: Normal rate.   Respiratory: Effort normal.  Musculoskeletal: Normal range of motion.  Neurological: She is alert and oriented to person, place, and time.  Skin: Skin is warm.    Review of Systems  Constitutional: Negative.   HENT: Negative.   Eyes: Negative.   Respiratory: Negative.   Cardiovascular: Negative.   Gastrointestinal: Negative.   Genitourinary: Negative.   Musculoskeletal: Negative.   Skin: Negative.   Neurological: Negative.   Endo/Heme/Allergies: Negative.   Psychiatric/Behavioral: Positive for depression. Negative for hallucinations and suicidal ideas. The patient is nervous/anxious. The patient does not have insomnia.     Blood pressure 126/75, pulse 98, temperature 98.9 F (37.2 C), temperature source Oral, resp. rate 16, height _0  (1.778 m), weight 88.5 kg (195 lb), SpO2 100 %.Body mass index is 27.98 kg/m.  General Appearance: Casual  Eye Contact:  Good  Speech:  Clear and Coherent and Normal Rate  Volume:  Normal  Mood:  Depressed  Affect:  Flat  Thought Process:  Goal Directed and Descriptions of Associations: Intact  Orientation:  Full (Time, Place, and Person)  Thought Content:  WDL  Suicidal Thoughts:  No  Homicidal  Thoughts:  No   Memory:  Immediate;   Good Recent;   Good Remote;   Good  Judgement:  Fair  Insight:  Fair  Psychomotor Activity:  Normal  Concentration:  Concentration: Good and Attention Span: Good  Recall:  Good  Fund of Knowledge:  Good  Language:  Good  Akathisia:  No  Handed:  Right  AIMS (if indicated):     Assets:  Desire for Improvement Financial Resources/Insurance Housing Physical Health Social Support Transportation  ADL's:  Intact  Cognition:  WNL  Sleep:  Number of Hours: 6.75   Problems Addressed: Hypokalemia HTN MDD severe  Treatment Plan Summary: Daily contact with patient to assess and evaluate symptoms and progress in treatment, Medication management and Plan is to:  -Continue Lamictal 25 mg PO Daily for mood stability -Continue Wellbutrin XL 300 mg PO Daily for mood stability -Continue K-Dur 20 meq PO BID for hypokalemia -Continue Trazodone 50 mg PO QHS for insomnia -Continue Vistaril 25 mg PO TID PRN for anxiety -Continue HCTZ 25 mg PO Daily for HTN -Recheck CMP tomorrow morning and ordered -Encourage group therapy participation  Lewis Shock, FNP 07/13/2017, 2:52 PM

## 2017-07-14 DIAGNOSIS — K219 Gastro-esophageal reflux disease without esophagitis: Secondary | ICD-10-CM | POA: Diagnosis present

## 2017-07-14 DIAGNOSIS — E785 Hyperlipidemia, unspecified: Secondary | ICD-10-CM | POA: Diagnosis present

## 2017-07-14 LAB — COMPREHENSIVE METABOLIC PANEL
ALT: 92 U/L — ABNORMAL HIGH (ref 14–54)
ANION GAP: 10 (ref 5–15)
AST: 44 U/L — ABNORMAL HIGH (ref 15–41)
Albumin: 3.8 g/dL (ref 3.5–5.0)
Alkaline Phosphatase: 70 U/L (ref 38–126)
BUN: 13 mg/dL (ref 6–20)
CHLORIDE: 101 mmol/L (ref 101–111)
CO2: 28 mmol/L (ref 22–32)
Calcium: 9 mg/dL (ref 8.9–10.3)
Creatinine, Ser: 1.12 mg/dL — ABNORMAL HIGH (ref 0.44–1.00)
GFR calc non Af Amer: 60 mL/min — ABNORMAL LOW (ref >60)
Glucose, Bld: 108 mg/dL — ABNORMAL HIGH (ref 65–99)
Potassium: 3.2 mmol/L — ABNORMAL LOW (ref 3.5–5.1)
SODIUM: 139 mmol/L (ref 135–145)
Total Bilirubin: 0.3 mg/dL (ref 0.3–1.2)
Total Protein: 7.4 g/dL (ref 6.5–8.1)

## 2017-07-14 MED ORDER — IBUPROFEN 600 MG PO TABS
600.0000 mg | ORAL_TABLET | Freq: Four times a day (QID) | ORAL | Status: DC | PRN
  Administered 2017-07-14 – 2017-07-17 (×5): 600 mg via ORAL
  Filled 2017-07-14 (×5): qty 1

## 2017-07-14 MED ORDER — BUPROPION HCL ER (XL) 150 MG PO TB24
450.0000 mg | ORAL_TABLET | Freq: Every day | ORAL | Status: DC
  Administered 2017-07-15: 450 mg via ORAL
  Filled 2017-07-14 (×3): qty 3

## 2017-07-14 NOTE — Tx Team (Signed)
Interdisciplinary Treatment and Diagnostic Plan Update  07/14/2017 Time of Session: 9:30am Alicia Robinson MRN: [id number]  Principal Diagnosis: MDD (major depressive disorder), single episode, severe with psychosis ([place])  Secondary Diagnoses: Principal Problem:   MDD (major depressive disorder), single episode, severe with psychosis ([place]) Active Problems:   Hypokalemia   HTN (hypertension)   Hyperlipidemia   GERD (gastroesophageal reflux disease)   Current Medications:  Current Facility-Administered Medications  Medication Dose Route Frequency Provider Last Rate Last Dose  . acetaminophen (TYLENOL) tablet 650 mg  650 mg Oral Q6H PRN Rankin, Shuvon B, NP   650 mg at 07/13/17 1855  . alum & mag hydroxide-simeth (MAALOX/MYLANTA) 200-200-20 MG/5ML suspension 30 mL  30 mL Oral Q4H PRN Rankin, Shuvon B, NP      . aspirin EC tablet 81 mg  81 mg Oral Daily Rankin, Shuvon B, NP   81 mg at 07/14/17 0806  . atorvastatin (LIPITOR) tablet 20 mg  20 mg Oral q1800 Rankin, Shuvon B, NP   20 mg at 07/13/17 1854  . [START ON 07/15/2017] buPROPion (WELLBUTRIN XL) 24 hr tablet 450 mg  450 mg Oral Daily Money, Lowry Ram, FNP      . feeding supplement (ENSURE ENLIVE) (ENSURE ENLIVE) liquid 237 mL  237 mL Oral BID BM Cobos, Fernando A, MD   237 mL at 07/13/17 1500  . hydrochlorothiazide (HYDRODIURIL) tablet 25 mg  25 mg Oral Daily Rankin, Shuvon B, NP   25 mg at 07/14/17 0806  . hydrOXYzine (ATARAX/VISTARIL) tablet 25 mg  25 mg Oral TID PRN Rankin, Shuvon B, NP   25 mg at 07/12/17 2247  . lamoTRIgine (LAMICTAL) tablet 25 mg  25 mg Oral Daily Rankin, Shuvon B, NP   25 mg at 07/14/17 0806  . magnesium hydroxide (MILK OF MAGNESIA) suspension 30 mL  30 mL Oral Daily PRN Rankin, Shuvon B, NP      . pantoprazole (PROTONIX) EC tablet 40 mg  40 mg Oral Daily Rankin, Shuvon B, NP   40 mg at 07/14/17 0806  . traZODone (DESYREL) tablet 50 mg  50 mg Oral QHS PRN Rankin, Shuvon B, NP   50 mg at 07/12/17 2247    PTA  Medications: Prescriptions Prior to Admission  Medication Sig Dispense Refill Last Dose  . atorvastatin (LIPITOR) 20 MG tablet Take 20 mg by mouth daily.   Past Week at Unknown time  . buPROPion (WELLBUTRIN XL) 300 MG 24 hr tablet Take 300 mg by mouth daily.   Past Week at Unknown time  . clonazePAM (KLONOPIN) 0.5 MG tablet Take 0.5 mg by mouth at bedtime.   Past Week at Unknown time  . desvenlafaxine (PRISTIQ) 50 MG 24 hr tablet Take 25-50 mg by mouth daily. 25 mg x 7 days then 50 mg daily started 07/10/17   Past Week at Unknown time  . hydrochlorothiazide (HYDRODIURIL) 25 MG tablet Take 25 mg by mouth daily.   Past Week at Unknown time  . aspirin EC 81 MG tablet Take 81 mg by mouth daily.   02/28/2017 at Unknown time    Treatment Modalities: Medication Management, Group therapy, Case management,  1 to 1 session with clinician, Psychoeducation, Recreational therapy.  Patient Stressors: Other: "I feel like a burden to my family."  Patient Strengths: Average or above average intelligence Communication skills General fund of knowledge Supportive family/friends  Physician Treatment Plan for Primary Diagnosis: MDD (major depressive disorder), single episode, severe with psychosis ([place]) Long Term Goal(s): Improvement in symptoms  so as ready for discharge  Short Term Goals: Ability to identify changes in lifestyle to reduce recurrence of condition will improve Ability to disclose and discuss suicidal ideas Ability to identify and develop effective coping behaviors will improve Compliance with prescribed medications will improve Ability to identify changes in lifestyle to reduce recurrence of condition will improve Ability to demonstrate self-control will improve Ability to maintain clinical measurements within normal limits will improve Ability to identify triggers associated with substance abuse/mental health issues will improve  Medication Management: Evaluate patient's response, side  effects, and tolerance of medication regimen.  Therapeutic Interventions: 1 to 1 sessions, Unit Group sessions and Medication administration.  Evaluation of Outcomes: Progressing  Physician Treatment Plan for Secondary Diagnosis: Principal Problem:   MDD (major depressive disorder), single episode, severe with psychosis ([place]) Active Problems:   Hypokalemia   HTN (hypertension)   Hyperlipidemia   GERD (gastroesophageal reflux disease)   Long Term Goal(s): Improvement in symptoms so as ready for discharge  Short Term Goals: Ability to identify changes in lifestyle to reduce recurrence of condition will improve Ability to disclose and discuss suicidal ideas Ability to identify and develop effective coping behaviors will improve Compliance with prescribed medications will improve Ability to identify changes in lifestyle to reduce recurrence of condition will improve Ability to demonstrate self-control will improve Ability to maintain clinical measurements within normal limits will improve Ability to identify triggers associated with substance abuse/mental health issues will improve  Medication Management: Evaluate patient's response, side effects, and tolerance of medication regimen.  Therapeutic Interventions: 1 to 1 sessions, Unit Group sessions and Medication administration.  Evaluation of Outcomes: Progressing   RN Treatment Plan for Primary Diagnosis: MDD (major depressive disorder), single episode, severe with psychosis ([place]) Long Term Goal(s): Knowledge of disease and therapeutic regimen to maintain health will improve  Short Term Goals: Ability to disclose and discuss suicidal ideas and Ability to identify and develop effective coping behaviors will improve  Medication Management: RN will administer medications as ordered by provider, will assess and evaluate patient's response and provide education to patient for prescribed medication. RN will report any adverse and/or side  effects to prescribing provider.  Therapeutic Interventions: 1 on 1 counseling sessions, Psychoeducation, Medication administration, Evaluate responses to treatment, Monitor vital signs and CBGs as ordered, Perform/monitor CIWA, COWS, AIMS and Fall Risk screenings as ordered, Perform wound care treatments as ordered.  Evaluation of Outcomes: Progressing   LCSW Treatment Plan for Primary Diagnosis: MDD (major depressive disorder), single episode, severe with psychosis ([place]) Long Term Goal(s): Safe transition to appropriate next level of care at discharge, Engage patient in therapeutic group addressing interpersonal concerns.  Short Term Goals: Engage patient in aftercare planning with referrals and resources and Increase skills for wellness and recovery  Therapeutic Interventions: Assess for all discharge needs, 1 to 1 time with Social worker, Explore available resources and support systems, Assess for adequacy in community support network, Educate family and significant other(s) on suicide prevention, Complete Psychosocial Assessment, Interpersonal group therapy.  Evaluation of Outcomes: Progressing   Progress in Treatment: Attending groups: Yes Participating in groups: Intermittently Taking medication as prescribed: Yes, MD continues to assess for medication changes as needed Toleration medication: Yes, no side effects reported at this time Family/Significant other contact made: No, CSW attempting to make contact with daughter Patient understands diagnosis: Continuing to assess Discussing patient identified problems/goals with staff: Yes Medical problems stabilized or resolved: Yes Denies suicidal/homicidal ideation: Yes Issues/concerns per patient self-inventory: None Other: N/A  New problem(s) identified: None identified at this time.   New Short Term/Long Term Goal(s): None identified at this time.   Discharge Plan or Barriers: Pt will return home and follow-up with outpatient  services.   Reason for Continuation of Hospitalization: Anxiety Depression Medication stabilization  Estimated Length of Stay: 2-3 days; est DC date 11/1  Attendees: Patient:  07/14/2017  4:33 PM  Physician: Dr. Nancy Fetter, Dr. Sanjuana Letters, MD 07/14/2017  4:33 PM  Nursing: Sherrine Maples, RN 07/14/2017  4:33 PM  RN Care Manager: Lars Pinks, RN 07/14/2017  4:33 PM  Social Worker: Adriana Reams, LCSW 07/14/2017  4:33 PM  Recreational Therapist:  07/14/2017  4:33 PM  Other: Lindell Spar, NP; Marvia Pickles, NP 07/14/2017  4:33 PM  Other:  07/14/2017  4:33 PM  Other: 07/14/2017  4:33 PM    Scribe for Treatment Team: Gladstone Lighter, LCSW 07/14/2017 4:33 PM

## 2017-07-14 NOTE — BHH Group Notes (Signed)
LCSW Group Therapy Note   07/14/2017 1:15pm   Type of Therapy and Topic:  Group Therapy:  Overcoming Obstacles   Participation Level:  Minimal   Description of Group:    In this group patients will be encouraged to explore what they see as obstacles to their own wellness and recovery. They will be guided to discuss their thoughts, feelings, and behaviors related to these obstacles. The group will process together ways to cope with barriers, with attention given to specific choices patients can make. Each patient will be challenged to identify changes they are motivated to make in order to overcome their obstacles. This group will be process-oriented, with patients participating in exploration of their own experiences as well as giving and receiving support and challenge from other group members.   Therapeutic Goals: 1. Patient will identify personal and current obstacles as they relate to admission. 2. Patient will identify barriers that currently interfere with their wellness or overcoming obstacles.  3. Patient will identify feelings, thought process and behaviors related to these barriers. 4. Patient will identify two changes they are willing to make to overcome these obstacles:      Summary of Patient Progress Pt did not participate in discussion but was attentive throughout.      Therapeutic Modalities:   Cognitive Behavioral Therapy Solution Focused Therapy Motivational Interviewing Relapse Prevention Therapy  Gladstone Lighter, LCSW 07/14/2017 4:33 PM

## 2017-07-14 NOTE — Progress Notes (Signed)
[facility] MD Progress Note  07/14/2017 12:52 PM Alicia Robinson  MRN:  [id number]   Subjective:  Patient reports that she had some difficulty sleeping last night, but she did not take the Trazodone because she thought she was tired enough to sleep. She plans to take the Trazodone tonight. She reports still feeling depressed and that she feels the Wellbutrin has not worked out as well as in the past. She is asking about increasing the dose or changing medications again. She denies any SI/HI/AVH and contracts for safety.  Objective: Patient's chart and findings reviewed and discussed with treatment team. She has been pleasant and cooperative. She has been attending group and participating. She has been interacting with peers and staff appropriately. Will increase her Wellbutrin XL to 450 mg Daily and continue the Lamictal.  Principal Problem: MDD (major depressive disorder), single episode, severe with psychosis ([place]) Diagnosis:   Patient Active Problem List   Diagnosis Date Noted  . Hypokalemia [E87.6] 07/13/2017  . HTN (hypertension) [I10] 07/13/2017  . MDD (major depressive disorder), single episode, severe with psychosis ([place]) [F32.3] 07/11/2017   Total Time spent with patient: 25 minutes  Past Psychiatric History: See H&P  Past Medical History:  Past Medical History:  Diagnosis Date  . Depression   . Hypertension     Past Surgical History:  Procedure Laterality Date  . CHOLECYSTECTOMY    . TUBAL LIGATION     Family History: History reviewed. No pertinent family history. Family Psychiatric  History: See H&P Social History:  History  Alcohol Use  . Yes    Comment: weekends     History  Drug Use  . Types: Marijuana    Social History   Social History  . Marital status: Single    Spouse name: N/A  . Number of children: N/A  . Years of education: N/A   Social History Main Topics  . Smoking status: Never Smoker  . Smokeless tobacco: Never Used  . Alcohol use Yes   Comment: weekends  . Drug use: Yes    Types: Marijuana  . Sexual activity: Yes   Other Topics Concern  . None   Social History Narrative  . None   Additional Social History:    History of alcohol / drug use?: Yes Name of Substance 1: Marijuana  1 - Age of First Use: UNKN 1 - Amount (size/oz): UNKN 1 - Frequency: daily  1 - Duration: UNKN 1 - Last Use / Amount: today                   Sleep: Fair  Appetite:  Good  Current Medications: Current Facility-Administered Medications  Medication Dose Route Frequency Provider Last Rate Last Dose  . acetaminophen (TYLENOL) tablet 650 mg  650 mg Oral Q6H PRN Rankin, Shuvon B, NP   650 mg at 07/13/17 1855  . alum & mag hydroxide-simeth (MAALOX/MYLANTA) 200-200-20 MG/5ML suspension 30 mL  30 mL Oral Q4H PRN Rankin, Shuvon B, NP      . aspirin EC tablet 81 mg  81 mg Oral Daily Rankin, Shuvon B, NP   81 mg at 07/14/17 0806  . atorvastatin (LIPITOR) tablet 20 mg  20 mg Oral q1800 Rankin, Shuvon B, NP   20 mg at 07/13/17 1854  . [START ON 07/15/2017] buPROPion (WELLBUTRIN XL) 24 hr tablet 450 mg  450 mg Oral Daily Arhum Peeples, Lowry Ram, FNP      . feeding supplement (ENSURE ENLIVE) (ENSURE ENLIVE) liquid 237 mL  237 mL Oral BID BM Cobos, Myer Peer, MD   237 mL at 07/13/17 1500  . hydrochlorothiazide (HYDRODIURIL) tablet 25 mg  25 mg Oral Daily Rankin, Shuvon B, NP   25 mg at 07/14/17 0806  . hydrOXYzine (ATARAX/VISTARIL) tablet 25 mg  25 mg Oral TID PRN Rankin, Shuvon B, NP   25 mg at 07/12/17 2247  . lamoTRIgine (LAMICTAL) tablet 25 mg  25 mg Oral Daily Rankin, Shuvon B, NP   25 mg at 07/14/17 0806  . magnesium hydroxide (MILK OF MAGNESIA) suspension 30 mL  30 mL Oral Daily PRN Rankin, Shuvon B, NP      . pantoprazole (PROTONIX) EC tablet 40 mg  40 mg Oral Daily Rankin, Shuvon B, NP   40 mg at 07/14/17 0806  . traZODone (DESYREL) tablet 50 mg  50 mg Oral QHS PRN Rankin, Shuvon B, NP   50 mg at 07/12/17 2247    Lab Results:  Results for  orders placed or performed during the hospital encounter of 07/11/17 (from the past 48 hour(s))  Comprehensive metabolic panel     Status: Abnormal   Collection Time: 07/14/17  6:35 AM  Result Value Ref Range   Sodium 139 135 - 145 mmol/L   Potassium 3.2 (L) 3.5 - 5.1 mmol/L   Chloride 101 101 - 111 mmol/L   CO2 28 22 - 32 mmol/L   Glucose, Bld 108 (H) 65 - 99 mg/dL   BUN 13 6 - 20 mg/dL   Creatinine, Ser 1.12 (H) 0.44 - 1.00 mg/dL   Calcium 9.0 8.9 - 10.3 mg/dL   Total Protein 7.4 6.5 - 8.1 g/dL   Albumin 3.8 3.5 - 5.0 g/dL   AST 44 (H) 15 - 41 U/L   ALT 92 (H) 14 - 54 U/L   Alkaline Phosphatase 70 38 - 126 U/L   Total Bilirubin 0.3 0.3 - 1.2 mg/dL   GFR calc non Af Amer 60 (L) >60 mL/min   GFR calc Af Amer >60 >60 mL/min    Comment: (NOTE) The eGFR has been calculated using the CKD EPI equation. This calculation has not been validated in all clinical situations. eGFR's persistently <60 mL/min signify possible Chronic Kidney Disease.    Anion gap 10 5 - 15    Comment: Performed at [facility], [place] [address] [place], [place] [address]    Blood Alcohol level:  Lab Results  Component Value Date   ETH <10 78/29/5621    Metabolic Disorder Labs: Lab Results  Component Value Date   HGBA1C 5.4 07/11/2017   MPG 108.28 07/11/2017   No results found for: PROLACTIN Lab Results  Component Value Date   CHOL 189 07/11/2017   TRIG 186 (H) 07/11/2017   HDL 53 07/11/2017   CHOLHDL 3.6 07/11/2017   VLDL 37 07/11/2017   LDLCALC 99 07/11/2017    Physical Findings: AIMS: Facial and Oral Movements Muscles of Facial Expression: None, normal Lips and Perioral Area: None, normal Jaw: None, normal Tongue: None, normal,Extremity Movements Upper (arms, wrists, hands, fingers): None, normal Lower (legs, knees, ankles, toes): None, normal, Trunk Movements Neck, shoulders, hips: None, normal, Overall Severity Severity of abnormal movements (highest score from  questions above): None, normal Incapacitation due to abnormal movements: None, normal Patient's awareness of abnormal movements (rate only patient's report): No Awareness, Dental Status Current problems with teeth and/or dentures?: No Does patient usually wear dentures?: No  CIWA:    COWS:     Musculoskeletal: Strength &  Muscle Tone: within normal limits Gait & Station: normal Patient leans: N/A  Psychiatric Specialty Exam: Physical Exam  Nursing note and vitals reviewed. Constitutional: She is oriented to person, place, and time. She appears well-developed and well-nourished.  Cardiovascular: Normal rate.   Respiratory: Effort normal.  Musculoskeletal: Normal range of motion.  Neurological: She is alert and oriented to person, place, and time.  Skin: Skin is warm.    Review of Systems  Constitutional: Negative.   HENT: Negative.   Eyes: Negative.   Respiratory: Negative.   Cardiovascular: Negative.   Gastrointestinal: Negative.   Genitourinary: Negative.   Musculoskeletal: Negative.   Skin: Negative.   Neurological: Negative.   Endo/Heme/Allergies: Negative.   Psychiatric/Behavioral: Positive for depression. Negative for hallucinations and suicidal ideas.    Blood pressure 119/75, pulse 79, temperature 97.6 F (36.4 C), temperature source Oral, resp. rate 16, height _0  (1.778 m), weight 88.5 kg (195 lb), SpO2 100 %.Body mass index is 27.98 kg/m.  General Appearance: Fairly Groomed  Eye Contact:  Good  Speech:  Clear and Coherent and Normal Rate  Volume:  Normal  Mood:  Depressed  Affect:  Flat  Thought Process:  Goal Directed and Descriptions of Associations: Intact  Orientation:  Full (Time, Place, and Person)  Thought Content:  WDL  Suicidal Thoughts:  No  Homicidal Thoughts:  No  Memory:  Immediate;   Good Recent;   Good Remote;   Good  Judgement:  Good  Insight:  Good  Psychomotor Activity:  Normal  Concentration:  Concentration: Good and Attention  Span: Good  Recall:  Good  Fund of Knowledge:  Good  Language:  Good  Akathisia:  No  Handed:  Right  AIMS (if indicated):     Assets:  Agricultural consultant Housing Physical Health Social Support Transportation  ADL's:  Intact  Cognition:  WNL  Sleep:  Number of Hours: 6.75   Problems Addressed: MDD severe GAD Hyperlipidemia GERD   Treatment Plan Summary: Daily contact with patient to assess and evaluate symptoms and progress in treatment, Medication management and Plan is to:  -Increase Wellbutrin XL 450 mg PO Daily for mood stability -Continue Lamictal 25 mg PO Daily for mood stability -Continue Vistaril 50 mg PO TID PRN for anxiety -Continue Lipitor 20 mg PO Daily for hyperlipidemia -Continue Protonix 40 mg PO Daily for GERD -Continue HCTZ 25 mg PO Daily for HTN -Encourage group therapy  Lewis Shock, FNP 07/14/2017, 12:52 PM

## 2017-07-14 NOTE — Progress Notes (Signed)
Recreation Therapy Notes  Date: 07/14/17 Time: 0930 Location: [address] Dayroom  Group Topic: Stress Management  Goal Area(s) Addresses:  Patient will verbalize importance of using healthy stress management.  Patient will identify positive emotions associated with healthy stress management.   Intervention: Stress Management  Activity :  Meditation.  LRT introduced the stress management technique of meditation.  LRT read script that allowed patients to sit quietly, focus on their breathing and just being.  Education:  Stress Management, Discharge Planning.   Education Outcome: Acknowledges edcuation/In group clarification offered/Needs additional education  Clinical Observations/Feedback: Pt did not attend group.    Alicia Robinson, LRT/CTRS         Ria Comment, Adaline Trejos A 07/14/2017 11:10 AM

## 2017-07-14 NOTE — Social Work (Signed)
Referred to [facility], is [place] resident.  Dannah Ryles, LCSW Lead Clinical Social Worker Phone:  [phone]  

## 2017-07-14 NOTE — Progress Notes (Signed)
D: Patient complains of poor sleep last night.  She also is complaining of a severe headache that was not relieved with tylenol.  Patient states her appetite is fair; her energy level is low and her concentration is poor.  She reports passive thoughts of self harm, however, she contracts for safety on the unit.  Patient rates her depression and anxiety as a 9; hopelessness as a 10.  Her goal today is to "stop being so sad, at least for today."  Patient has attended some groups with minimal participation. A: Continue to monitor medication management and MD orders.  Safety checks completed every 15 minutes per protocol. Offer support and encouragement as needed. R: Patient is receptive to staff; her behavior is appropriate.

## 2017-07-14 NOTE — Plan of Care (Signed)
Problem: Safety: Goal: Periods of time without injury will increase Outcome: Progressing Patient reports thoughts of self harm, however, she contracts for safety on the unit.

## 2017-07-15 MED ORDER — VENLAFAXINE HCL ER 37.5 MG PO CP24
37.5000 mg | ORAL_CAPSULE | Freq: Every day | ORAL | Status: DC
  Administered 2017-07-16 – 2017-07-18 (×3): 37.5 mg via ORAL
  Filled 2017-07-15 (×4): qty 1

## 2017-07-15 MED ORDER — ASPIRIN-ACETAMINOPHEN-CAFFEINE 250-250-65 MG PO TABS
2.0000 | ORAL_TABLET | Freq: Once | ORAL | Status: AC
  Administered 2017-07-15: 2 via ORAL
  Filled 2017-07-15 (×2): qty 2

## 2017-07-15 MED ORDER — ASPIRIN-ACETAMINOPHEN-CAFFEINE 250-250-65 MG PO TABS
ORAL_TABLET | ORAL | Status: AC
  Filled 2017-07-15: qty 1

## 2017-07-15 MED ORDER — POTASSIUM CHLORIDE CRYS ER 20 MEQ PO TBCR
20.0000 meq | EXTENDED_RELEASE_TABLET | Freq: Two times a day (BID) | ORAL | Status: AC
  Administered 2017-07-15 – 2017-07-16 (×3): 20 meq via ORAL
  Filled 2017-07-15 (×3): qty 1

## 2017-07-15 NOTE — BHH Suicide Risk Assessment (Signed)
[place] INPATIENT:  Family/Significant Other Suicide Prevention Education  Suicide Prevention Education:  Education Completed; Evalee Mutton, Pt's daughter [phone], has been identified by the patient as the family member/significant other with whom the patient will be residing, and identified as the person(s) who will aid the patient in the event of a mental health crisis (suicidal ideations/suicide attempt).  With written consent from the patient, the family member/significant other has been provided the following suicide prevention education, prior to the and/or following the discharge of the patient.  The suicide prevention education provided includes the following:  Suicide risk factors  Suicide prevention and interventions  National Suicide Hotline telephone number  [facility] assessment telephone number  [facility] Emergency Assistance [place] and/or Residential Mobile Crisis Unit telephone number  Request made of family/significant other to:  Remove weapons (e.g., guns, rifles, knives), all items previously/currently identified as safety concern.    Remove drugs/medications (over-the-counter, prescriptions, illicit drugs), all items previously/currently identified as a safety concern.  The family member/significant other verbalizes understanding of the suicide prevention education information provided.  The family member/significant other agrees to remove the items of safety concern listed above.  Pt's daughter reports being supportive of Pt and feels that she has made progress while hospitalized. Daughter reports that she will be supportive of Pt on the outpatient basis.    Gladstone Lighter 07/15/2017, 9:57 AM

## 2017-07-15 NOTE — Progress Notes (Signed)
Adult Psychoeducational Group Note  Date:  07/15/2017 Time:  12:07 AM  Group Topic/Focus:  Wrap-Up Group:   The focus of this group is to help patients review their daily goal of treatment and discuss progress on daily workbooks.  Participation Level:  Active  Participation Quality:  Appropriate  Affect:  Appropriate  Cognitive:  Appropriate  Insight: Good  Engagement in Group:  Engaged  Modes of Intervention:  Activity  Additional Comments:  Pt rated her day an 8. Goal is to find reasons to live and to be happier.  Gloriajean Dell Kenzi Bardwell 07/15/2017, 12:07 AM

## 2017-07-15 NOTE — Progress Notes (Signed)
D: Pt was in the dayroom upon initial approach.  Pt presents with depressed affect and mood.  Describes her day as "okay, but I was anxious and depressed this morning."  Pt reports she feels better tonight.  Her goal is "not to be sad the rest of the day, so far, so good."  Pt denies SI/HI, denies hallucinations, reports pain from headache of 8/10.  Pt has been visible in milieu interacting with peers and staff appropriately.  Pt attended evening group.    A: Introduced self to pt.  Actively listened to pt and offered support and encouragement. PRN medication administered for anxiety and pain.  Q15 minute safety checks maintained.    R: Pt is safe on the unit.  Pt is compliant with medications.  Pt verbally contracts for safety.  Will continue to monitor and assess.

## 2017-07-15 NOTE — Plan of Care (Signed)
Problem: Coping: Goal: Ability to demonstrate self-control will improve Outcome: Progressing Pt has maintained control of her behavior tonight.  She reports that she stepped out of group when she was upset earlier today and used her coping skill of coloring to de-escalate.

## 2017-07-15 NOTE — Plan of Care (Signed)
Problem: Activity: Goal: Interest or engagement in activities will improve Outcome: Progressing Pt has been visible in milieu interacting with peers appropriately.  She attended evening group tonight.

## 2017-07-15 NOTE — [facility] Group Notes (Signed)
Pt attended spiritual care group on grief and loss facilitated by chaplain Jerene Pitch   Group opened with brief discussion and psycho-social ed around grief and loss in relationships and in relation to self - identifying life patterns, circumstances, changes that cause losses. Established group norm of speaking from own life experience. Group goal of establishing open and affirming space for members to share loss and experience with grief, normalize grief experience and provide psycho social education and grief support.  Group engaged in facilitated discussion around experience of grief, discussed tasks of mourning with acknowledgement of where these fit into their own journey.   Group facilitation drew on Pastoral care, Adlerian, Narrative, and psycho-dynamic group theories.    Alicia Robinson was present throughout group.  Alert and attentive to discussion, evidenced by eye contact, head nodding and other body language. She did not engage in group discussion.    [facility] / [place], MDiv

## 2017-07-15 NOTE — Progress Notes (Signed)
Adult Psychoeducational Group Note  Date:  07/15/2017 Time:  9:28 PM  Group Topic/Focus:  Wrap-Up Group:   The focus of this group is to help patients review their daily goal of treatment and discuss progress on daily workbooks.  Participation Level:  Active  Participation Quality:  Appropriate  Affect:  Appropriate  Cognitive:  Oriented  Insight: Good  Engagement in Group:  Engaged  Modes of Intervention:  Activity  Additional Comments:  Pt rated her day a 6. Goal is to think more positive.  Gloriajean Dell Yamile Roedl 07/15/2017, 9:28 PM

## 2017-07-15 NOTE — B[facility] Group Notes (Signed)
LCSW Group Therapy Note  07/15/2017 1:15pm  Type of Therapy/Topic:  Group Therapy:  Feelings about Diagnosis  Participation Level:  Did Not Attend   Description of Group:   This group will allow patients to explore their thoughts and feelings about diagnoses they have received. Patients will be guided to explore their level of understanding and acceptance of these diagnoses. Facilitator will encourage patients to process their thoughts and feelings about the reactions of others to their diagnosis and will guide patients in identifying ways to discuss their diagnosis with significant others in their lives. This group will be process-oriented, with patients participating in exploration of their own experiences, giving and receiving support, and processing challenge from other group members.   Therapeutic Goals: 1. Patient will demonstrate understanding of diagnosis as evidenced by identifying two or more symptoms of the disorder 2. Patient will be able to express two feelings regarding the diagnosis 3. Patient will demonstrate their ability to communicate their needs through discussion and/or role play     Therapeutic Modalities:   Cognitive Behavioral Therapy Brief Therapy Feelings Identification    Gladstone Lighter, LCSW 07/15/2017 3:53 PM

## 2017-07-15 NOTE — Progress Notes (Signed)
Recreation Therapy Notes  Animal-Assisted Activity (AAA) Program Checklist/Progress Notes Patient Eligibility Criteria Checklist & Daily Group note for Rec TxIntervention  Date: 10.30.2018 Time: 2:45pm Location: [address] [employer]   AAA/T Program Assumption of Risk Form signed by Patient/ or Parent Legal Guardian Yes  Patient is free of allergies or sever asthma Yes  Patient reports no fear of animals Yes  Patient reports no history of cruelty to animals Yes  Patient understands his/her participation is voluntary Yes  Patient washes hands before animal contact Yes  Patient washes hands after animal contact Yes  Behavioral Response: Appropriate   Education:Hand Washing, Appropriate Animal Interaction   Education Outcome: Acknowledges education.   Clinical Observations/Feedback: Patient attended session and interacted appropriately with therapy dog and peers.   Laureen Ochs Nikita Humble, LRT/CTRS        Jayvon Mounger L 07/15/2017 3:06 PM

## 2017-07-15 NOTE — Progress Notes (Signed)
D: Pt was in her room with visitors upon initial approach.  Pt presents with anxious, depressed affect and mood.  Pt reports she was "depressed" earlier today.  She reports that this morning the group was "talking about grief" and she became sad and had to leave group.  Her goal was to "think positive" and she "accomplished it by coloring" and she "read today's paper and that helped."  Pt was referring to the daily program papers passed out by staff.  Pt denies SI/HI, denies hallucinations, reports pain from headache of 5/10.  Pt has been visible in milieu interacting with peers and staff appropriately.  Pt attended evening group.    A: Introduced self to pt.  Actively listened to pt and offered support and encouragement. PRN medication administered for sleep, pain, and anxiety.  Q15 minute safety checks maintained.  Positive coping skills encouraged and reinforced.    R: Pt is safe on the unit.  Pt is compliant with medications.  Pt verbally contracts for safety.  Will continue to monitor and assess.

## 2017-07-15 NOTE — Progress Notes (Signed)
[facility] MD Progress Note  07/15/2017 1:32 PM Alicia Robinson  MRN:  [id number]   Subjective:  Patient reports partial improvement but states she is still depressed and that today she has been " remembering some of the losses I have had ". Describes deaths of multiple loved ones over the years- states her maternal grandparents, a brother, a sister have passed away. Describes having a significant headache today. She states she does have a history of headaches. She is unsure if it is a side effect.   Objective:  I have met with patient and have reviewed chart and discussed case with treatment team . She is a 41 year old female, lives with her adult son and daughter in law. She is employed . Presented due to worsening depression with suicidal ideations. States she has a long history of depression, but worsening recently. Prior to admission had been taking Wellbutrin . Home med list also indicates she had been started on Pristiq but states she had not started it.  As per staff, patient has presented depressed, sad, vaguely anxious . At this time is alert, attentive, depressed, denies suicidal ideations. Behavior on unit in good control.  Principal Problem: MDD (major depressive disorder), single episode, severe with psychosis ([place]) Diagnosis:   Patient Active Problem List   Diagnosis Date Noted  . Hyperlipidemia [E78.5] 07/14/2017  . GERD (gastroesophageal reflux disease) [K21.9] 07/14/2017  . Hypokalemia [E87.6] 07/13/2017  . HTN (hypertension) [I10] 07/13/2017  . MDD (major depressive disorder), single episode, severe with psychosis ([place]) [F32.3] 07/11/2017   Total Time spent with patient: 20 minutes  Past Psychiatric History: See H&P  Past Medical History:  Past Medical History:  Diagnosis Date  . Depression   . Hypertension     Past Surgical History:  Procedure Laterality Date  . CHOLECYSTECTOMY    . TUBAL LIGATION     Family History: History reviewed. No pertinent family  history. Family Psychiatric  History: See H&P Social History:  History  Alcohol Use  . Yes    Comment: weekends     History  Drug Use  . Types: Marijuana    Social History   Social History  . Marital status: Single    Spouse name: N/A  . Number of children: N/A  . Years of education: N/A   Social History Main Topics  . Smoking status: Never Smoker  . Smokeless tobacco: Never Used  . Alcohol use Yes     Comment: weekends  . Drug use: Yes    Types: Marijuana  . Sexual activity: Yes   Other Topics Concern  . None   Social History Narrative  . None   Additional Social History:    History of alcohol / drug use?: Yes Name of Substance 1: Marijuana  1 - Age of First Use: UNKN 1 - Amount (size/oz): UNKN 1 - Frequency: daily  1 - Duration: UNKN 1 - Last Use / Amount: today   Sleep: Fair  Appetite:  improving   Current Medications: Current Facility-Administered Medications  Medication Dose Route Frequency Provider Last Rate Last Dose  . aspirin-acetaminophen-caffeine (EXCEDRIN MIGRAINE) 250-250-65 MG per tablet           . acetaminophen (TYLENOL) tablet 650 mg  650 mg Oral Q6H PRN Rankin, Shuvon B, NP   650 mg at 07/15/17 1205  . alum & mag hydroxide-simeth (MAALOX/MYLANTA) 200-200-20 MG/5ML suspension 30 mL  30 mL Oral Q4H PRN Rankin, Shuvon B, NP      .  aspirin EC tablet 81 mg  81 mg Oral Daily Rankin, Shuvon B, NP   81 mg at 07/15/17 0754  . atorvastatin (LIPITOR) tablet 20 mg  20 mg Oral q1800 Rankin, Shuvon B, NP   20 mg at 07/14/17 1708  . buPROPion (WELLBUTRIN XL) 24 hr tablet 450 mg  450 mg Oral Daily Money, Lowry Ram, FNP   450 mg at 07/15/17 0754  . feeding supplement (ENSURE ENLIVE) (ENSURE ENLIVE) liquid 237 mL  237 mL Oral BID BM Leonetta Mcgivern A, MD   237 mL at 07/13/17 1500  . hydrochlorothiazide (HYDRODIURIL) tablet 25 mg  25 mg Oral Daily Rankin, Shuvon B, NP   25 mg at 07/15/17 0754  . hydrOXYzine (ATARAX/VISTARIL) tablet 25 mg  25 mg Oral TID PRN  Rankin, Shuvon B, NP   25 mg at 07/15/17 1205  . ibuprofen (ADVIL,MOTRIN) tablet 600 mg  600 mg Oral Q6H PRN Laverle Hobby, PA-C   600 mg at 07/15/17 0758  . lamoTRIgine (LAMICTAL) tablet 25 mg  25 mg Oral Daily Rankin, Shuvon B, NP   25 mg at 07/15/17 0754  . magnesium hydroxide (MILK OF MAGNESIA) suspension 30 mL  30 mL Oral Daily PRN Rankin, Shuvon B, NP      . pantoprazole (PROTONIX) EC tablet 40 mg  40 mg Oral Daily Rankin, Shuvon B, NP   40 mg at 07/15/17 0754  . traZODone (DESYREL) tablet 50 mg  50 mg Oral QHS PRN Rankin, Shuvon B, NP   50 mg at 07/14/17 2240    Lab Results:  Results for orders placed or performed during the hospital encounter of 07/11/17 (from the past 48 hour(s))  Comprehensive metabolic panel     Status: Abnormal   Collection Time: 07/14/17  6:35 AM  Result Value Ref Range   Sodium 139 135 - 145 mmol/L   Potassium 3.2 (L) 3.5 - 5.1 mmol/L   Chloride 101 101 - 111 mmol/L   CO2 28 22 - 32 mmol/L   Glucose, Bld 108 (H) 65 - 99 mg/dL   BUN 13 6 - 20 mg/dL   Creatinine, Ser 1.12 (H) 0.44 - 1.00 mg/dL   Calcium 9.0 8.9 - 10.3 mg/dL   Total Protein 7.4 6.5 - 8.1 g/dL   Albumin 3.8 3.5 - 5.0 g/dL   AST 44 (H) 15 - 41 U/L   ALT 92 (H) 14 - 54 U/L   Alkaline Phosphatase 70 38 - 126 U/L   Total Bilirubin 0.3 0.3 - 1.2 mg/dL   GFR calc non Af Amer 60 (L) >60 mL/min   GFR calc Af Amer >60 >60 mL/min    Comment: (NOTE) The eGFR has been calculated using the CKD EPI equation. This calculation has not been validated in all clinical situations. eGFR's persistently <60 mL/min signify possible Chronic Kidney Disease.    Anion gap 10 5 - 15    Comment: Performed at [facility], [place] [address], [place], [place] [address]    Blood Alcohol level:  Lab Results  Component Value Date   ETH <10 86/76/1952    Metabolic Disorder Labs: Lab Results  Component Value Date   HGBA1C 5.4 07/11/2017   MPG 108.28 07/11/2017   No results found for:  PROLACTIN Lab Results  Component Value Date   CHOL 189 07/11/2017   TRIG 186 (H) 07/11/2017   HDL 53 07/11/2017   CHOLHDL 3.6 07/11/2017   VLDL 37 07/11/2017   LDLCALC 99 07/11/2017  Physical Findings: AIMS: Facial and Oral Movements Muscles of Facial Expression: None, normal Lips and Perioral Area: None, normal Jaw: None, normal Tongue: None, normal,Extremity Movements Upper (arms, wrists, hands, fingers): None, normal Lower (legs, knees, ankles, toes): None, normal, Trunk Movements Neck, shoulders, hips: None, normal, Overall Severity Severity of abnormal movements (highest score from questions above): None, normal Incapacitation due to abnormal movements: None, normal Patient's awareness of abnormal movements (rate only patient's report): No Awareness, Dental Status Current problems with teeth and/or dentures?: No Does patient usually wear dentures?: No  CIWA:    COWS:     Musculoskeletal: Strength & Muscle Tone: within normal limits Gait & Station: normal Patient leans: N/A  Psychiatric Specialty Exam: Physical Exam  Nursing note and vitals reviewed. Constitutional: She is oriented to person, place, and time. She appears well-developed and well-nourished.  Cardiovascular: Normal rate.   Respiratory: Effort normal.  Musculoskeletal: Normal range of motion.  Neurological: She is alert and oriented to person, place, and time.  Skin: Skin is warm.    Review of Systems  Constitutional: Negative.   HENT: Negative.   Eyes: Negative.   Respiratory: Negative.   Cardiovascular: Negative.   Gastrointestinal: Negative.   Genitourinary: Negative.   Musculoskeletal: Negative.   Skin: Negative.   Neurological: Negative.   Endo/Heme/Allergies: Negative.   Psychiatric/Behavioral: Positive for depression. Negative for hallucinations and suicidal ideas.  headache- has history of headaches, denies visual disturbances, denies chest pain, no dyspnea.  Blood pressure 120/74,  pulse 88, temperature 99.4 F (37.4 C), temperature source Oral, resp. rate 18, height '5\' 10"'$  (1.778 m), weight 88.5 kg (195 lb), SpO2 100 %.Body mass index is 27.98 kg/m.  General Appearance: Fairly Groomed  Eye Contact:  Fair  Speech:  Normal Rate  Volume:  Normal  Mood:  Depressed  Affect:  constricted, vaguely anxious   Thought Process:  Linear and Descriptions of Associations: Intact  Orientation:  Other:  fully alert and attentive  oriented x 3.   Thought Content:  no hallucinations, no delusions, not internally preoccupied   Suicidal Thoughts:  No denies current suicidal or self injurious ideations, denies homicidal ideations  Homicidal Thoughts:  No  Memory:  recent and remote grossly intact   Judgement:  Other:  improving   Insight:  improving   Psychomotor Activity:  Normal  Concentration:  Concentration: Good and Attention Span: Good  Recall:  Good  Fund of Knowledge:  Good  Language:  Good  Akathisia:  No  Handed:  Right  AIMS (if indicated):     Assets:  Agricultural consultant Housing Physical Health Social Support Transportation  ADL's:  Intact  Cognition:  WNL  Sleep:  Number of Hours: 6    Assessment - patient reports ongoing depression, sadness, but denies suicidal ideations. She complains of headache today, but states she does have history of frequent headaches. Patient states she had done well on Wellbutrin in the past, with good response, and had restarted it in June 2018. Patient reports history of headaches, which may have worsened since she restarted Wellbutrin, and today complains of increased headache coinciding with dose increase. States Zoloft and Prozac did not work, but agrees to [employer] trial .   Treatment Plan Summary: Treatment Plan reviewed as below today 10/30 Daily contact with patient to assess and evaluate symptoms and progress in treatment, Medication management and Plan is to:  Encourage group and milieu  participation to work on coping skills and symptom reduction Treatment team working on  disposition planning  -KDUR for hypokalemia- 20 mEQ x 3 doses and repeat BMP in AM. -D/C Wellbutrin- see rationale above -Start Effexor XR 37.5 mgrs QDAY for depression, anxiety -Continue Lamictal 25 mg QDAY  for mood disorder  -Continue Vistaril 50 mg PO TID PRN for anxiety -Continue Lipitor 20 mg PO Daily for hyperlipidemia -Continue Protonix 40 mg PO Daily for GERD -Continue HCTZ 25 mg PO Daily for HTN   Jenne Campus, MD 07/15/2017, 1:32 PM   Patient ID: Alicia Robinson, female   DOB: [birth year], 41 y.o.   MRN: [id number]

## 2017-07-15 NOTE — Plan of Care (Signed)
Problem: Activity: Goal: Interest or engagement in leisure activities will improve Outcome: Progressing Pt attends and participates in scheduled groups.

## 2017-07-16 LAB — BASIC METABOLIC PANEL
ANION GAP: 11 (ref 5–15)
BUN: 18 mg/dL (ref 6–20)
CHLORIDE: 104 mmol/L (ref 101–111)
CO2: 25 mmol/L (ref 22–32)
Calcium: 9.1 mg/dL (ref 8.9–10.3)
Creatinine, Ser: 1.17 mg/dL — ABNORMAL HIGH (ref 0.44–1.00)
GFR calc non Af Amer: 57 mL/min — ABNORMAL LOW (ref >60)
GLUCOSE: 105 mg/dL — AB (ref 65–99)
POTASSIUM: 3.4 mmol/L — AB (ref 3.5–5.1)
Sodium: 140 mmol/L (ref 135–145)

## 2017-07-16 NOTE — Progress Notes (Signed)
D: Pt denies SI/HI/AVH. Pt is pleasant and cooperative. Pt stated she was doing better, not SI anymore. Pt seen interacting with peers on the unit.  A: Pt was offered support and encouragement. Pt was given scheduled medications. Pt was encourage to attend groups. Q 15 minute checks were done for safety.   R:Pt attends groups and interacts well with peers and staff. Pt is taking medication. Pt has no complaints.Pt receptive to treatment and safety maintained on unit.

## 2017-07-16 NOTE — Progress Notes (Addendum)
D: Patient has minimal interaction with staff today.  She has been sleeping the majority of the morning.  She remains anxious with depressed affect and mood.  Patient denies any thoughts of self harm.  She is currently in the social worker's group with some participation.  Patient has been visible in the milieu this afternoon and appears to be progressing well with her treatment.  Patient rates her depression as a 5; hopelessness as an 8; anxiety as a 7. A: Continue to monitor medication management and MD orders.  Safety checks completed every 15 minutes per protocol.  Offer support and encouragement as needed. R: Patient is somewhat isolative with minimal interaction with staff.

## 2017-07-16 NOTE — BHH Group Notes (Signed)
[facility] Mental Health Association Group Therapy 07/16/2017 1:15pm  Type of Therapy: Mental Health Association Presentation  Participation Level: Active  Participation Quality: Attentive  Affect: Appropriate  Cognitive: Oriented  Insight: Developing/Improving  Engagement in Therapy: Engaged  Modes of Intervention: Discussion, Education and Socialization  Summary of Progress/Problems: [place] ([place] Speaker came to talk about his personal journey with mental health. The pt processed ways by which to relate to the speaker. [place] speaker provided handouts and educational information pertaining to groups and services offered by the [facility]. Pt was engaged in speaker's presentation and was receptive to resources provided.    Gladstone Lighter, LCSW 07/16/2017 4:43 PM

## 2017-07-16 NOTE — Plan of Care (Signed)
Problem: Safety: Goal: Periods of time without injury will increase Outcome: Progressing Pt safe on the unit at this time   

## 2017-07-16 NOTE — Progress Notes (Signed)
[facility] MD Progress Note  07/16/2017 5:04 PM Alicia Robinson  MRN:  [id number]   Subjective:  Patient states she feels better today, and is feeling less depressed. She attributes this in part to improvement of headache she had yesterday, which was severe. Today has some mild residual pain, but " much better". Denies medication side effects. Denies suicidal ideations.  Objective:  I have met with patient and have reviewed chart and discussed case with treatment team . As per staff patient remains vaguely anxious, depressed, and slept most the AM, which she states was due to headache and fair sleep last night. At this time states she is feeling better, and presents fully alert, attentive, and with a brighter affect. Denies medication side effects.  No disruptive or agitated behaviors on unit. Labs reviewed- K+ improved to 3.4     Principal Problem: MDD (major depressive disorder), single episode, severe with psychosis ([place]) Diagnosis:   Patient Active Problem List   Diagnosis Date Noted  . Hyperlipidemia [E78.5] 07/14/2017  . GERD (gastroesophageal reflux disease) [K21.9] 07/14/2017  . Hypokalemia [E87.6] 07/13/2017  . HTN (hypertension) [I10] 07/13/2017  . MDD (major depressive disorder), single episode, severe with psychosis ([place]) [F32.3] 07/11/2017   Total Time spent with patient: 20 minutes  Past Psychiatric History: See H&P  Past Medical History:  Past Medical History:  Diagnosis Date  . Depression   . Hypertension     Past Surgical History:  Procedure Laterality Date  . CHOLECYSTECTOMY    . TUBAL LIGATION     Family History: History reviewed. No pertinent family history. Family Psychiatric  History: See H&P Social History:  History  Alcohol Use  . Yes    Comment: weekends     History  Drug Use  . Types: Marijuana    Social History   Social History  . Marital status: Single    Spouse name: N/A  . Number of children: N/A  . Years of education: N/A   Social  History Main Topics  . Smoking status: Never Smoker  . Smokeless tobacco: Never Used  . Alcohol use Yes     Comment: weekends  . Drug use: Yes    Types: Marijuana  . Sexual activity: Yes   Other Topics Concern  . None   Social History Narrative  . None   Additional Social History:    History of alcohol / drug use?: Yes Name of Substance 1: Marijuana  1 - Age of First Use: UNKN 1 - Amount (size/oz): UNKN 1 - Frequency: daily  1 - Duration: UNKN 1 - Last Use / Amount: today   Sleep: Fair  Appetite:  improving   Current Medications: Current Facility-Administered Medications  Medication Dose Route Frequency Provider Last Rate Last Dose  . acetaminophen (TYLENOL) tablet 650 mg  650 mg Oral Q6H PRN Rankin, Shuvon B, NP   650 mg at 07/15/17 1205  . alum & mag hydroxide-simeth (MAALOX/MYLANTA) 200-200-20 MG/5ML suspension 30 mL  30 mL Oral Q4H PRN Rankin, Shuvon B, NP      . aspirin EC tablet 81 mg  81 mg Oral Daily Rankin, Shuvon B, NP   81 mg at 07/16/17 0802  . atorvastatin (LIPITOR) tablet 20 mg  20 mg Oral q1800 Rankin, Shuvon B, NP   20 mg at 07/15/17 1657  . hydrochlorothiazide (HYDRODIURIL) tablet 25 mg  25 mg Oral Daily Rankin, Shuvon B, NP   25 mg at 07/16/17 0802  . hydrOXYzine (ATARAX/VISTARIL) tablet 25 mg  25 mg Oral TID PRN Rankin, Shuvon B, NP   25 mg at 07/16/17 1506  . ibuprofen (ADVIL,MOTRIN) tablet 600 mg  600 mg Oral Q6H PRN Laverle Hobby, PA-C   600 mg at 07/16/17 1506  . lamoTRIgine (LAMICTAL) tablet 25 mg  25 mg Oral Daily Rankin, Shuvon B, NP   25 mg at 07/16/17 0802  . magnesium hydroxide (MILK OF MAGNESIA) suspension 30 mL  30 mL Oral Daily PRN Rankin, Shuvon B, NP      . pantoprazole (PROTONIX) EC tablet 40 mg  40 mg Oral Daily Rankin, Shuvon B, NP   40 mg at 07/16/17 0803  . potassium chloride SA (K-DUR,KLOR-CON) CR tablet 20 mEq  20 mEq Oral BID Tija Biss, Myer Peer, MD   20 mEq at 07/16/17 0803  . traZODone (DESYREL) tablet 50 mg  50 mg Oral QHS PRN  Rankin, Shuvon B, NP   50 mg at 07/15/17 2152  . venlafaxine XR (EFFEXOR-XR) 24 hr capsule 37.5 mg  37.5 mg Oral Q breakfast Sharmin Foulk, Myer Peer, MD   37.5 mg at 07/16/17 0803    Lab Results:  Results for orders placed or performed during the hospital encounter of 07/11/17 (from the past 48 hour(s))  Basic metabolic panel     Status: Abnormal   Collection Time: 07/16/17  6:42 AM  Result Value Ref Range   Sodium 140 135 - 145 mmol/L   Potassium 3.4 (L) 3.5 - 5.1 mmol/L   Chloride 104 101 - 111 mmol/L   CO2 25 22 - 32 mmol/L   Glucose, Bld 105 (H) 65 - 99 mg/dL   BUN 18 6 - 20 mg/dL   Creatinine, Ser 1.17 (H) 0.44 - 1.00 mg/dL   Calcium 9.1 8.9 - 10.3 mg/dL   GFR calc non Af Amer 57 (L) >60 mL/min   GFR calc Af Amer >60 >60 mL/min    Comment: (NOTE) The eGFR has been calculated using the CKD EPI equation. This calculation has not been validated in all clinical situations. eGFR's persistently <60 mL/min signify possible Chronic Kidney Disease.    Anion gap 11 5 - 15    Comment: Performed at [facility], [place] [address] [place], [place] [address]    Blood Alcohol level:  Lab Results  Component Value Date   ETH <10 30/86/5784    Metabolic Disorder Labs: Lab Results  Component Value Date   HGBA1C 5.4 07/11/2017   MPG 108.28 07/11/2017   No results found for: PROLACTIN Lab Results  Component Value Date   CHOL 189 07/11/2017   TRIG 186 (H) 07/11/2017   HDL 53 07/11/2017   CHOLHDL 3.6 07/11/2017   VLDL 37 07/11/2017   LDLCALC 99 07/11/2017    Physical Findings: AIMS: Facial and Oral Movements Muscles of Facial Expression: None, normal Lips and Perioral Area: None, normal Jaw: None, normal Tongue: None, normal,Extremity Movements Upper (arms, wrists, hands, fingers): None, normal Lower (legs, knees, ankles, toes): None, normal, Trunk Movements Neck, shoulders, hips: None, normal, Overall Severity Severity of abnormal movements (highest score from  questions above): None, normal Incapacitation due to abnormal movements: None, normal Patient's awareness of abnormal movements (rate only patient's report): No Awareness, Dental Status Current problems with teeth and/or dentures?: No Does patient usually wear dentures?: No  CIWA:    COWS:     Musculoskeletal: Strength & Muscle Tone: within normal limits Gait & Station: normal Patient leans: N/A  Psychiatric Specialty Exam: Physical Exam  Nursing note and  vitals reviewed. Constitutional: She is oriented to person, place, and time. She appears well-developed and well-nourished.  Cardiovascular: Normal rate.   Respiratory: Effort normal.  Musculoskeletal: Normal range of motion.  Neurological: She is alert and oriented to person, place, and time.  Skin: Skin is warm.    Review of Systems  Constitutional: Negative.   HENT: Negative.   Eyes: Negative.   Respiratory: Negative.   Cardiovascular: Negative.   Gastrointestinal: Negative.   Genitourinary: Negative.   Musculoskeletal: Negative.   Skin: Negative.   Neurological: Negative.   Endo/Heme/Allergies: Negative.   Psychiatric/Behavioral: Positive for depression. Negative for hallucinations and suicidal ideas.  improved headache, no visual disturbances, no chest pain  Blood pressure 129/86, pulse 98, temperature 98.9 F (37.2 C), temperature source Oral, resp. rate 20, height 5' 10"  (1.778 m), weight 88.5 kg (195 lb), SpO2 100 %.Body mass index is 27.98 kg/m.  General Appearance: improved grooming   Eye Contact:  improved   Speech:  Normal Rate  Volume:  Normal  Mood:  less depressed today, acknowledges she is feeling better  Affect:  less constricted   Thought Process:  Linear and Descriptions of Associations: Intact  Orientation:  Other:  fully alert and attentive  oriented x 3.   Thought Content:  no hallucinations, no delusions, not internally preoccupied   Suicidal Thoughts:  No denies current suicidal or self  injurious ideations, denies homicidal ideations  Homicidal Thoughts:  No  Memory:  recent and remote grossly intact   Judgement:  Other:  improving   Insight:  improving   Psychomotor Activity:  Normal  Concentration:  Concentration: Good and Attention Span: Good  Recall:  Good  Fund of Knowledge:  Good  Language:  Good  Akathisia:  No  Handed:  Right  AIMS (if indicated):     Assets:  Agricultural consultant Housing Physical Health Social Support Transportation  ADL's:  Intact  Cognition:  WNL  Sleep:  Number of Hours: 6.5    Assessment -patient is presenting with  Gradually improving mood and range of affect today, which she attributes in part to improved headache today. At this time not restless or  in any acute distress. She is tolerating medications well, denies side effects.  Denies suicidal ideations at this time.     Treatment Plan Summary: Treatment Plan reviewed as below today 10/31 Daily contact with patient to assess and evaluate symptoms and progress in treatment, Medication management and Plan is to:  Encourage group and milieu participation to work on coping skills and symptom reduction Treatment team working on disposition [place] for hypokalemia -Continue  Effexor XR 37.5 mgrs QDAY for depression, anxiety -Continue Lamictal 25 mg QDAY  for mood disorder  -Continue Vistaril 50 mg PO TID PRN for anxiety -Continue Lipitor 20 mg PO Daily for hyperlipidemia -Continue Protonix 40 mg PO Daily for GERD -Continue HCTZ 25 mg PO Daily for HTN   Jenne Campus, MD 07/16/2017, 5:04 PM   Patient ID: Alicia Robinson, female   DOB: 1976-01-16, 41 y.o.   MRN: [id number]

## 2017-07-17 DIAGNOSIS — E785 Hyperlipidemia, unspecified: Secondary | ICD-10-CM

## 2017-07-17 DIAGNOSIS — H538 Other visual disturbances: Secondary | ICD-10-CM

## 2017-07-17 DIAGNOSIS — F39 Unspecified mood [affective] disorder: Secondary | ICD-10-CM

## 2017-07-17 DIAGNOSIS — R51 Headache: Secondary | ICD-10-CM

## 2017-07-17 DIAGNOSIS — G4700 Insomnia, unspecified: Secondary | ICD-10-CM

## 2017-07-17 DIAGNOSIS — K219 Gastro-esophageal reflux disease without esophagitis: Secondary | ICD-10-CM

## 2017-07-17 MED ORDER — IBUPROFEN 800 MG PO TABS: 800.0000 mg | ORAL_TABLET | Freq: Three times a day (TID) | ORAL | Status: DC | PRN

## 2017-07-17 MED ORDER — MIRTAZAPINE 7.5 MG PO TABS
7.5000 mg | ORAL_TABLET | Freq: Every day | ORAL | Status: DC
  Administered 2017-07-17: 7.5 mg via ORAL
  Filled 2017-07-17 (×3): qty 1

## 2017-07-17 NOTE — BHH Group Notes (Signed)
LCSW Group Therapy Note  07/17/2017 1:15pm  Type of Therapy/Topic:  Group Therapy:  Balance in Life  Participation Level:  Active  Description of Group:    This group will address the concept of balance and how it feels and looks when one is unbalanced. Patients will be encouraged to process areas in their lives that are out of balance and identify reasons for remaining unbalanced. Facilitators will guide patients in utilizing problem-solving interventions to address and correct the stressor making their life unbalanced. Understanding and applying boundaries will be explored and addressed for obtaining and maintaining a balanced life. Patients will be encouraged to explore ways to assertively make their unbalanced needs known to significant others in their lives, using other group members and facilitator for support and feedback.  Therapeutic Goals: 1. Patient will identify two or more emotions or situations they have that consume much of in their lives. 2. Patient will identify signs/triggers that life has become out of balance:  3. Patient will identify two ways to set boundaries in order to achieve balance in their lives:  4. Patient will demonstrate ability to communicate their needs through discussion and/or role plays  Summary of Patient Progress: Pt was present for the duration of the group. Pt states that she is feeling balanced today and is scheduled to discharge tomorrow. Pt reports some anxiety about returning to work because she does not enjoy her coworkers or the long hours she is forced to work. Pt's affect was appropriate.    Therapeutic Modalities:   Cognitive Behavioral Therapy Solution-Focused Therapy Assertiveness Training  Georga Kaufmann, MSW, [place] 07/17/2017 2:55 PM

## 2017-07-17 NOTE — Progress Notes (Signed)
Adult Psychoeducational Group Note  Date:  07/17/2017 Time:  9:25 PM  Group Topic/Focus:  Wrap-Up Group:   The focus of this group is to help patients review their daily goal of treatment and discuss progress on daily workbooks.  Participation Level:  Active  Participation Quality:  Appropriate  Affect:  Appropriate  Cognitive:  Alert, Appropriate and Oriented  Insight: Appropriate  Engagement in Group:  Engaged  Modes of Intervention:  Discussion  Additional Comments:  Patient attended wrap-up group and said that her day was a 7.  Patient said he coping skills for today were socializing, sleeping,and meeting new peers.   Natayla Cadenhead W Kelsen Celona 54/10/7060, 9:25 PM

## 2017-07-17 NOTE — Progress Notes (Signed)
[facility] MD Progress Note  07/17/2017 12:26 PM Alicia Robinson  MRN:  [id number]   Subjective:  Patinet reports felling better, just her headache is still bothering her and she usually takes Ibuprofen 800 mg at home. She also reports disrupted sleep all night and asks for this to be assisted with. She has complaint about headache and blurred vision, but then reports that she is supposed to wear glasses and has not had them here.  Objective: Patient's chart and findings reviewed and discussed with treatment team. Patient is pleasant and cooperative upon approach. She is reading in the day room and has been seen interacting with others appropriately. She has been attending groups. Due to patient complaint of headaches will increase Ibuprofen to 800 mg Q8H PRN. Will add Mirtazapine 7.5 mg QHS for sleep and mood. Requested patient to have glasses brought to hospital to assist with blurred vision and headaches.    Principal Problem: MDD (major depressive disorder), single episode, severe with psychosis ([place]) Diagnosis:   Patient Active Problem List   Diagnosis Date Noted  . Hyperlipidemia [E78.5] 07/14/2017  . GERD (gastroesophageal reflux disease) [K21.9] 07/14/2017  . Hypokalemia [E87.6] 07/13/2017  . HTN (hypertension) [I10] 07/13/2017  . MDD (major depressive disorder), single episode, severe with psychosis ([place]) [F32.3] 07/11/2017   Total Time spent with patient: 25 minutes  Past Psychiatric History: See H&P  Past Medical History:  Past Medical History:  Diagnosis Date  . Depression   . Hypertension     Past Surgical History:  Procedure Laterality Date  . CHOLECYSTECTOMY    . TUBAL LIGATION     Family History: History reviewed. No pertinent family history. Family Psychiatric  History: See H&P Social History:  History  Alcohol Use  . Yes    Comment: weekends     History  Drug Use  . Types: Marijuana    Social History   Social History  . Marital status: Single    Spouse name:  N/A  . Number of children: N/A  . Years of education: N/A   Social History Main Topics  . Smoking status: Never Smoker  . Smokeless tobacco: Never Used  . Alcohol use Yes     Comment: weekends  . Drug use: Yes    Types: Marijuana  . Sexual activity: Yes   Other Topics Concern  . None   Social History Narrative  . None   Additional Social History:    History of alcohol / drug use?: Yes Name of Substance 1: Marijuana  1 - Age of First Use: UNKN 1 - Amount (size/oz): UNKN 1 - Frequency: daily  1 - Duration: UNKN 1 - Last Use / Amount: today                   Sleep: Fair  Appetite:  Good  Current Medications: Current Facility-Administered Medications  Medication Dose Route Frequency Provider Last Rate Last Dose  . acetaminophen (TYLENOL) tablet 650 mg  650 mg Oral Q6H PRN Rankin, Shuvon B, NP   650 mg at 07/16/17 2209  . alum & mag hydroxide-simeth (MAALOX/MYLANTA) 200-200-20 MG/5ML suspension 30 mL  30 mL Oral Q4H PRN Rankin, Shuvon B, NP      . aspirin EC tablet 81 mg  81 mg Oral Daily Rankin, Shuvon B, NP   81 mg at 07/17/17 0822  . atorvastatin (LIPITOR) tablet 20 mg  20 mg Oral q1800 Rankin, Shuvon B, NP   20 mg at 07/16/17 1715  . hydrochlorothiazide (  HYDRODIURIL) tablet 25 mg  25 mg Oral Daily Rankin, Shuvon B, NP   25 mg at 07/17/17 2620  . hydrOXYzine (ATARAX/VISTARIL) tablet 25 mg  25 mg Oral TID PRN Rankin, Shuvon B, NP   25 mg at 07/16/17 2209  . ibuprofen (ADVIL,MOTRIN) tablet 800 mg  800 mg Oral Q8H PRN Money, Lowry Ram, FNP      . lamoTRIgine (LAMICTAL) tablet 25 mg  25 mg Oral Daily Rankin, Shuvon B, NP   25 mg at 07/17/17 3559  . magnesium hydroxide (MILK OF MAGNESIA) suspension 30 mL  30 mL Oral Daily PRN Rankin, Shuvon B, NP      . mirtazapine (REMERON) tablet 7.5 mg  7.5 mg Oral QHS Money, Lowry Ram, FNP      . pantoprazole (PROTONIX) EC tablet 40 mg  40 mg Oral Daily Rankin, Shuvon B, NP   40 mg at 07/17/17 7416  . traZODone (DESYREL) tablet 50 mg   50 mg Oral QHS PRN Rankin, Shuvon B, NP   50 mg at 07/16/17 2209  . venlafaxine XR (EFFEXOR-XR) 24 hr capsule 37.5 mg  37.5 mg Oral Q breakfast Sayid Moll, Myer Peer, MD   37.5 mg at 07/17/17 3845    Lab Results:  Results for orders placed or performed during the hospital encounter of 07/11/17 (from the past 48 hour(s))  Basic metabolic panel     Status: Abnormal   Collection Time: 07/16/17  6:42 AM  Result Value Ref Range   Sodium 140 135 - 145 mmol/L   Potassium 3.4 (L) 3.5 - 5.1 mmol/L   Chloride 104 101 - 111 mmol/L   CO2 25 22 - 32 mmol/L   Glucose, Bld 105 (H) 65 - 99 mg/dL   BUN 18 6 - 20 mg/dL   Creatinine, Ser 1.17 (H) 0.44 - 1.00 mg/dL   Calcium 9.1 8.9 - 10.3 mg/dL   GFR calc non Af Amer 57 (L) >60 mL/min   GFR calc Af Amer >60 >60 mL/min    Comment: (NOTE) The eGFR has been calculated using the CKD EPI equation. This calculation has not been validated in all clinical situations. eGFR's persistently <60 mL/min signify possible Chronic Kidney Disease.    Anion gap 11 5 - 15    Comment: Performed at [facility], [place] [address], [place], [place] [address]    Blood Alcohol level:  Lab Results  Component Value Date   ETH <10 12/05/2246    Metabolic Disorder Labs: Lab Results  Component Value Date   HGBA1C 5.4 07/11/2017   MPG 108.28 07/11/2017   No results found for: PROLACTIN Lab Results  Component Value Date   CHOL 189 07/11/2017   TRIG 186 (H) 07/11/2017   HDL 53 07/11/2017   CHOLHDL 3.6 07/11/2017   VLDL 37 07/11/2017   LDLCALC 99 07/11/2017    Physical Findings: AIMS: Facial and Oral Movements Muscles of Facial Expression: None, normal Lips and Perioral Area: None, normal Jaw: None, normal Tongue: None, normal,Extremity Movements Upper (arms, wrists, hands, fingers): None, normal Lower (legs, knees, ankles, toes): None, normal, Trunk Movements Neck, shoulders, hips: None, normal, Overall Severity Severity of abnormal movements  (highest score from questions above): None, normal Incapacitation due to abnormal movements: None, normal Patient's awareness of abnormal movements (rate only patient's report): No Awareness, Dental Status Current problems with teeth and/or dentures?: No Does patient usually wear dentures?: No  CIWA:    COWS:     Musculoskeletal: Strength & Muscle Tone: within  normal limits Gait & Station: normal Patient leans: N/A  Psychiatric Specialty Exam: Physical Exam  Nursing note and vitals reviewed. Constitutional: She is oriented to person, place, and time. She appears well-developed and well-nourished.  Cardiovascular: Normal rate.   Respiratory: Effort normal.  Musculoskeletal: Normal range of motion.  Neurological: She is alert and oriented to person, place, and time.  Skin: Skin is warm.    Review of Systems  Constitutional: Negative.   HENT: Negative.   Eyes: Positive for blurred vision.  Respiratory: Negative.   Cardiovascular: Negative.   Gastrointestinal: Negative.   Genitourinary: Negative.   Musculoskeletal: Negative.   Skin: Negative.   Neurological: Positive for headaches.  Endo/Heme/Allergies: Negative.   Psychiatric/Behavioral: Positive for depression. Negative for hallucinations and suicidal ideas.    Blood pressure 110/72, pulse 86, temperature (!) 97.3 F (36.3 C), resp. rate 18, height 5' 10"  (1.778 m), weight 88.5 kg (195 lb), SpO2 100 %.Body mass index is 27.98 kg/m.  General Appearance: Casual  Eye Contact:  Good  Speech:  Clear and Coherent and Normal Rate  Volume:  Normal  Mood:  Depressed  Affect:  Depressed  Thought Process:  Goal Directed and Descriptions of Associations: Intact  Orientation:  Full (Time, Place, and Person)  Thought Content:  WDL  Suicidal Thoughts:  No  Homicidal Thoughts:  No  Memory:  Immediate;   Good Recent;   Good Remote;   Good  Judgement:  Fair  Insight:  Fair  Psychomotor Activity:  Normal  Concentration:   Concentration: Good and Attention Span: Good  Recall:  Good  Fund of Knowledge:  Good  Language:  Good  Akathisia:  No  Handed:  Right  AIMS (if indicated):     Assets:  Communication Skills Desire for Improvement Financial Resources/Insurance Housing Physical Health Social Support Transportation  ADL's:  Intact  Cognition:  WNL  Sleep:  Number of Hours: 6.75   Problems Addressed: HTN MDD severe GERD   Treatment Plan Summary: Daily contact with patient to assess and evaluate symptoms and progress in treatment, Medication management and Plan is to:  -Increase Ibuprofen 800 mg PO Q8H PRN for headaches, cramps and pain -Start Remeron 7.5 mg PO QHS for mood stability and sleep -Continue Effexor-XR 37.5 mg PO Daily for mood stability and titrate as needed -Continue Lamictal 25 mg PO Daily for mood stability -Continue Trazodone 50 mg PO QHS PRN for sleep -Continue HCTZ 25 mg PO Daily for HTN -Continue Lipitor 20 mg PO Daily for hyperlipidemia -Encourage group therapy participation -Visitors to bring glasses tonight  Lewis Shock, FNP 07/17/2017, 12:26 PM   Agree with NP Progress Note

## 2017-07-17 NOTE — Progress Notes (Signed)
DAR NOTE: Patient presents with anxious affect and depressed mood. Pt complained of headache and lack of sleep last night. Pt stated she takes Ibuprofen 800 mg at home and it has helped. Pt has been visible in the milieu, but stayed more in the room. Pt is not interacting much, a bit withdrawn. Per self inventory, reported poor sleep, good appetite, low energy, and poor concentration. Denies auditory and visual hallucinations.  Rates depression at 0, hopelessness at 10, and anxiety at 8.  Maintained on routine safety checks.  Medications given as prescribed.  Support and encouragement offered as needed. States goal for today is " get rid of headache.a"  Will continue to monitor.

## 2017-07-18 MED ORDER — HYDROCHLOROTHIAZIDE 25 MG PO TABS: 25.0000 mg | ORAL_TABLET | Freq: Every day | ORAL | 0 refills | Status: DC

## 2017-07-18 MED ORDER — TRAZODONE HCL 50 MG PO TABS: 50.0000 mg | ORAL_TABLET | Freq: Every evening | ORAL | 0 refills | Status: DC | PRN

## 2017-07-18 MED ORDER — OMEPRAZOLE 20 MG PO CPDR: 20.0000 mg | DELAYED_RELEASE_CAPSULE | Freq: Every day | ORAL | 0 refills | Status: DC

## 2017-07-18 MED ORDER — VENLAFAXINE HCL ER 75 MG PO CP24: 75.0000 mg | ORAL_CAPSULE | Freq: Every day | ORAL | 0 refills | Status: DC

## 2017-07-18 MED ORDER — LAMOTRIGINE 25 MG PO TABS: 25.0000 mg | ORAL_TABLET | Freq: Every day | ORAL | 0 refills | Status: DC

## 2017-07-18 MED ORDER — VENLAFAXINE HCL ER 75 MG PO CP24
75.0000 mg | ORAL_CAPSULE | Freq: Every day | ORAL | Status: DC
  Filled 2017-07-18 (×2): qty 1

## 2017-07-18 MED ORDER — MIRTAZAPINE 7.5 MG PO TABS: 7.5000 mg | ORAL_TABLET | Freq: Every day | ORAL | 0 refills | Status: DC

## 2017-07-18 NOTE — Progress Notes (Signed)
  [facility] Adult Case Management Discharge Plan :  Will you be returning to the same living situation after discharge:  Yes,  Pt returning home At discharge, do you have transportation home?: Yes,  Pt family to pick up Do you have the ability to pay for your medications: Yes,  Pt provided with prescriptions  Release of information consent forms completed and in the chart;  Patient's signature needed at discharge.  Patient to Follow up at: Follow-up [facility] Follow up on 07/22/2017.   Specialty:  Behavioral Health Why:  at 1:30pm for medication management with Evalina Field, PA and therapy with Criselda Peaches at 2:00pm.  Contact information: [place] [address] [phone]        [facility] Follow up.   Specialty:  Behavioral Health Why:  Patient will be followed by Transitional Care Team for case management beginning on day of discharge Contact information: [place] [id number] [phone]           Next level of care provider has access to [place] and Suicide Prevention discussed: Yes,  with daughter; see SPE note  Have you used any form of tobacco in the last 30 days? (Cigarettes, Smokeless Tobacco, Cigars, and/or Pipes): No  Has patient been referred to the Quitline?: N/A patient is not a smoker  Patient has been referred for addiction treatment: Yes  [place] Lighter, LCSW 07/18/2017, 9:59 AM

## 2017-07-18 NOTE — Discharge Summary (Signed)
Physician Discharge Summary Note  Patient:  Alicia Robinson is an 41 y.o., female MRN:  [id number] DOB:  11/29/1975 Patient phone:  [phone] (home)  Patient address:   [place] [address],  Total Time spent with patient: 30 minutes  Date of Admission:  07/11/2017 Date of Discharge: 07/18/2017  Reason for Admission: Per assessment note- Alicia I Ramosis an 41 y.o.femalewho came to [facility] as a walk in from her PCP's office due to suicidal thoughts and ideations to "just not wake up". Pt is flat and depressed during assessment and states that she has a history of depression but it has been getting worse the past 2 weeks. She states that she has been thinking about suicide more this past week and went to her doctor for medications. She has been trying to get in to see a psychiatrist for a month but has not been successful. She [facility] and was prescribed 2 more medications but does not feel any better. Pt denies HI or AVH but states she feels her head and neck start to hurt and she feels like there is something "telling her not to talk or move". Pt has a history of 3 psychiatric inpatient admissions and has had one overdose attempt when she was 14. Her admissions were in [place] where she is from. Pt currently lives with her son and daughter who are 19 and 96. She has a friend for support. Pt is currently employed and is having to take off work because of her symptoms. No legal issues noted. Pt states that she can't contract for safety and would not be safe at home.   Principal Problem: MDD (major depressive disorder), single episode, severe with psychosis [facility]) Discharge Diagnoses: Patient Active Problem List   Diagnosis Date Noted  . Hyperlipidemia [E78.5] 07/14/2017  . GERD (gastroesophageal reflux disease) [K21.9] 07/14/2017  . Hypokalemia [E87.6] 07/13/2017  . HTN (hypertension) [I10] 07/13/2017  . MDD (major depressive  disorder), single episode, severe with psychosis ([place]) [F32.3] 07/11/2017    Past Psychiatric History:   Past Medical History:  Past Medical History:  Diagnosis Date  . Depression   . Hypertension     Past Surgical History:  Procedure Laterality Date  . CHOLECYSTECTOMY    . TUBAL LIGATION     Family History: History reviewed. No pertinent family history. Family Psychiatric  History: Social History:  History  Alcohol Use  . Yes    Comment: weekends     History  Drug Use  . Types: Marijuana    Social History   Social History  . Marital status: Single    Spouse name: N/A  . Number of children: N/A  . Years of education: N/A   Social History Main Topics  . Smoking status: Never Smoker  . Smokeless tobacco: Never Used  . Alcohol use Yes     Comment: weekends  . Drug use: Yes    Types: Marijuana  . Sexual activity: Yes   Other Topics Concern  . None   Social History Narrative  . None    Hospital Course:  Alicia Robinson was admitted for MDD (major depressive disorder), single episode, severe with psychosis ([place])  and crisis management.  Pt was treated discharged with the medications listed below under Medication List.  Medical problems were identified and treated as needed.  Home medications were restarted as appropriate.  Improvement was monitored by observation and Alicia Robinson 's daily report of symptom reduction.  Emotional and mental status was monitored by daily self-inventory reports completed by Alicia Robinson and clinical staff.         Alicia Robinson was evaluated by the treatment team for stability and plans for continued recovery upon discharge. Alicia Robinson 's motivation was an integral factor for scheduling further treatment. Employment, transportation, bed availability, health status, family support, and any pending legal issues were also considered during hospital stay. Pt was offered further treatment options upon discharge including but not limited to  Residential, Intensive Outpatient, and Outpatient treatment.  Alicia Robinson will follow up with the services as listed below under Follow Up Information.     Upon completion of this admission the patient was both mentally and medically stable for discharge denying suicidal/homicidal ideation, auditory/visual/tactile hallucinations, delusional thoughts and paranoia.    Alicia Robinson responded well to treatment with Effexor 75 mg and Lamictal 25 mg and Remeron 7.5mg   without adverse effects.  Pt demonstrated improvement without reported or observed adverse effects to the point of stability appropriate for outpatient management. Pertinent labs include: BMP, CMP and Lipid  Panel for which outpatient follow-up is necessary for lab recheck as mentioned below. Reviewed CBC, CMP, BAL, and UDS; all unremarkable aside from noted exceptions.   Physical Findings: AIMS: Facial and Oral Movements Muscles of Facial Expression: None, normal Lips and Perioral Area: None, normal Jaw: None, normal Tongue: None, normal,Extremity Movements Upper (arms, wrists, hands, fingers): None, normal Lower (legs, knees, ankles, toes): None, normal, Trunk Movements Neck, shoulders, hips: None, normal, Overall Severity Severity of abnormal movements (highest score from questions above): None, normal Incapacitation due to abnormal movements: None, normal Patient's awareness of abnormal movements (rate only patient's report): No Awareness, Dental Status Current problems with teeth and/or dentures?: No Does patient usually wear dentures?: No  CIWA:    COWS:     Musculoskeletal: Strength & Muscle Tone: within normal limits Gait & Station: normal Patient leans: N/A  Psychiatric Specialty Exam:  See SRA by MD Physical Exam  Vitals reviewed. Constitutional: She appears well-developed.  Cardiovascular: Normal rate.   Neurological: She is alert.  Psychiatric: She has a normal mood and affect.    Review of Systems   Psychiatric/Behavioral: Negative for depression (stable) and suicidal ideas. The patient is not nervous/anxious and does not have insomnia.     Blood pressure 102/68, pulse 100, temperature 98.3 F (36.8 C), temperature source Oral, resp. rate 20, height 5\' 10"  (1.778 m), weight 88.5 kg (195 lb), SpO2 100 %.Body mass index is 27.98 kg/m.    Have you used any form of tobacco in the last 30 days? (Cigarettes, Smokeless Tobacco, Cigars, and/or Pipes): No  Has this patient used any form of tobacco in the last 30 days? (Cigarettes, Smokeless Tobacco, Cigars, and/or Pipes)  No  Blood Alcohol level:  Lab Results  Component Value Date   ETH <10 78/29/5621    Metabolic Disorder Labs:  Lab Results  Component Value Date   HGBA1C 5.4 07/11/2017   MPG 108.28 07/11/2017   No results found for: PROLACTIN Lab Results  Component Value Date   CHOL 189 07/11/2017   TRIG 186 (H) 07/11/2017   HDL 53 07/11/2017   CHOLHDL 3.6 07/11/2017   VLDL 37 07/11/2017   [place] 99 07/11/2017    See Psychiatric Specialty Exam and Suicide Risk Assessment completed by Attending Physician prior to discharge.  Discharge destination:  Home  Is  patient on multiple antipsychotic therapies at discharge:  No   Has Patient had three or more failed trials of antipsychotic monotherapy by history:  No  Recommended Plan for Multiple Antipsychotic Therapies: NA  Discharge Instructions    Diet - low sodium heart healthy    Complete by:  As directed    Discharge instructions    Complete by:  As directed    Take all medications as prescribed. Keep all follow-up appointments as scheduled.  Do not consume alcohol or use illegal drugs while on prescription medications. Report any adverse effects from your medications to your primary care provider promptly.  In the event of recurrent symptoms or worsening symptoms, call 911, a crisis hotline, or go to the nearest emergency department for evaluation.   Increase  activity slowly    Complete by:  As directed      Allergies as of 07/18/2017   No Known Allergies     Medication List    STOP taking these medications   buPROPion 300 MG 24 hr tablet Commonly known as:  WELLBUTRIN XL   clonazePAM 0.5 MG tablet Commonly known as:  KLONOPIN   PRISTIQ 50 MG 24 hr tablet Generic drug:  desvenlafaxine     TAKE these medications     Indication  aspirin EC 81 MG tablet Take 81 mg by mouth daily.  Indication:  Acute Heart Attack, Pain   atorvastatin 20 MG tablet Commonly known as:  LIPITOR Take 20 mg by mouth daily.  Indication:  High Amount of Fats in the Blood   hydrochlorothiazide 25 MG tablet Commonly known as:  HYDRODIURIL Take 1 tablet (25 mg total) by mouth daily.  Indication:  High Blood Pressure Disorder, hypertension   lamoTRIgine 25 MG tablet Commonly known as:  LAMICTAL Take 1 tablet (25 mg total) by mouth daily.  Indication:  Depression, Mood stabilizatio   mirtazapine 7.5 MG tablet Commonly known as:  REMERON Take 1 tablet (7.5 mg total) by mouth at bedtime.  Indication:  Major Depressive Disorder   omeprazole 20 MG capsule Commonly known as:  PRILOSEC Take 1 capsule (20 mg total) by mouth daily.  Indication:  Gastroesophageal Reflux Disease   traZODone 50 MG tablet Commonly known as:  DESYREL Take 1 tablet (50 mg total) by mouth at bedtime as needed for sleep.  Indication:  Trouble Sleeping   venlafaxine XR 75 MG 24 hr capsule Commonly known as:  EFFEXOR-XR Take 1 capsule (75 mg total) by mouth daily with breakfast.  Indication:  Major Depressive Disorder      Follow-up [facility] Follow up on 07/22/2017.   Specialty:  Behavioral Health Why:  at 1:30pm for medication management with Alicia Field, PA and therapy with Alicia Robinson at 2:00pm.  Contact information: [place] [address] [phone]        [facility] Follow up.   Specialty:  Behavioral Health Why:   Patient will be followed by Transitional Care Team for case management beginning on day of discharge Contact information: [place] [address] [phone]           Follow-up recommendations:  Activity:  as tolerated Diet:  heart healthy  Comments:  Take all medications as prescribed. Keep all follow-up appointments as scheduled.  Do not consume alcohol or use illegal drugs while on prescription medications. Report any adverse effects from your medications to your primary care provider promptly.  In the event of recurrent symptoms  or worsening symptoms, call 911, a crisis hotline, or go to the nearest emergency department for evaluation.   Signed: Derrill Center, NP 07/18/2017, 9:46 AM   Patient seen, Suicide Assessment Completed.  Disposition Plan Reviewed

## 2017-07-18 NOTE — Progress Notes (Signed)
Alicia Robinson is prepared to de discharged as she is given dc instructions, these are reviewed with her and then she is given cc ( AVS, SRA, transition and SSP). She completes her daily assessment and on this she writes she denies SI today and she rates her depression, hopelessness and anxeity " 2/4/6", respectively, She is accompanied to bldg entrance and dc'd per MD order.

## 2017-07-18 NOTE — BHH Suicide Risk Assessment (Signed)
[facility] Discharge Suicide Risk Assessment   Principal Problem: MDD (major depressive disorder), single episode, severe with psychosis [facility]) Discharge Diagnoses:  Patient Active Problem List   Diagnosis Date Noted  . Hyperlipidemia [E78.5] 07/14/2017  . GERD (gastroesophageal reflux disease) [K21.9] 07/14/2017  . Hypokalemia [E87.6] 07/13/2017  . HTN (hypertension) [I10] 07/13/2017  . MDD (major depressive disorder), single episode, severe with psychosis ([place]) [F32.3] 07/11/2017    Total Time spent with patient: 30 minutes  Musculoskeletal: Strength & Muscle Tone: within normal limits Gait & Station: normal Patient leans: N/A  Psychiatric Specialty Exam: ROS denies headache, no chest pain, no shortness of breath  Blood pressure 102/68, pulse 100, temperature 98.3 F (36.8 C), temperature source Oral, resp. rate 20, height 5\' 10"  (1.778 m), weight 88.5 kg (195 lb), SpO2 100 %.Body mass index is 27.98 kg/m.  General Appearance: Well Groomed  Eye Contact::  Good  Speech:  Normal Rate409  Volume:  Normal  Mood:  improving mood   Affect:  Appropriate  Thought Process:  Linear and Descriptions of Associations: Intact  Orientation:  Full (Time, Place, and Person)  Thought Content:  denies hallucinations, no delusions , not internally preoccupied   Suicidal Thoughts:  No denies suicidal or self injurious ideations, denies homicidal or violent ideations  Homicidal Thoughts:  No  Memory:  recent and remote grossly intact   Judgement:  Other:  improving   Insight:  improving   Psychomotor Activity:  Normal  Concentration:  Good  Recall:  Good  Fund of Knowledge:Good  Language: Good  Akathisia:  Negative  Handed:  Right  AIMS (if indicated):     Assets:  Communication Skills Desire for Improvement Resilience  Sleep:  Number of Hours: 6.25  Cognition: WNL  ADL's:  Intact   Mental Status Per Nursing Assessment::   On Admission:     Demographic Factors:  41 year old female ,  lives with her two children, employed   Loss Factors: History of depression, which had been worsening prior to admission, causing her to have difficulty in daily functioning   Historical Factors: History of depression, history of prior psychiatric admissions   Risk Reduction Factors:   Responsible for children under 53 years of age, Sense of responsibility to family, Employed, Living with another person, especially a relative and Positive coping skills or problem solving skills  Continued Clinical Symptoms:  Patient is alert, attentive, well related, pleasant, improved grooming , good eye contact, speech normal, mood is described as much better and presents euthymic today, affect is appropriate and reactive, no thought disorder, no suicidal or self injurious ideations, no homicidal or violent ideations, no hallucinations, no delusions, not internally preoccupied . Future oriented . Expresses readiness for discharge. Behavior on unit calm and in good control, pleasant on approach. Denies medication side effects. Of note, patient states that her headache, which had been reported as severe, is now resolved .  Cognitive Features That Contribute To Risk:  No gross cognitive deficits noted upon discharge. Is alert , attentive, and oriented x 3   Suicide Risk:  Mild:  Suicidal ideation of limited frequency, intensity, duration, and specificity.  There are no identifiable plans, no associated intent, mild dysphoria and related symptoms, good self-control (both objective and subjective assessment), few other risk factors, and identifiable protective factors, including available and accessible social support.  Follow-up [place] Follow up on 07/22/2017.   Specialty:  Behavioral Health Why:  at 1:30pm for medication management  with Evalina Field, PA and therapy with Criselda Peaches at 2:00pm.  Contact information: [place] [address] [phone]         [facility] Follow up.   Specialty:  Behavioral Health Why:  Patient will be followed by Transitional Care Team for case management beginning on day of discharge Contact information: [place] [address] [phone]           Plan Of Care/Follow-up recommendations:  Activity:  as tolerated  Diet:  heart healthy Tests:  NA Other:  See below   Patient is expressing readiness for discharge and is leaving in good spirits  Plans to return home Plans to follow up as above  She has an established PCP at [place], MD 07/18/2017, 9:33 AM

## 2017-07-18 NOTE — Tx Team (Signed)
Interdisciplinary Treatment and Diagnostic Plan Update  07/18/2017 Time of Session: 9:30am Alicia Robinson MRN: [id number]  Principal Diagnosis: MDD (major depressive disorder), single episode, severe with psychosis ([place])  Secondary Diagnoses: Principal Problem:   MDD (major depressive disorder), single episode, severe with psychosis ([place]) Active Problems:   Hypokalemia   HTN (hypertension)   Hyperlipidemia   GERD (gastroesophageal reflux disease)   Current Medications:  Current Facility-Administered Medications  Medication Dose Route Frequency Provider Last Rate Last Dose  . acetaminophen (TYLENOL) tablet 650 mg  650 mg Oral Q6H PRN Rankin, Shuvon B, NP   650 mg at 07/16/17 2209  . alum & mag hydroxide-simeth (MAALOX/MYLANTA) 200-200-20 MG/5ML suspension 30 mL  30 mL Oral Q4H PRN Rankin, Shuvon B, NP      . aspirin EC tablet 81 mg  81 mg Oral Daily Rankin, Shuvon B, NP   81 mg at 07/17/17 0822  . atorvastatin (LIPITOR) tablet 20 mg  20 mg Oral q1800 Rankin, Shuvon B, NP   20 mg at 07/17/17 1823  . hydrochlorothiazide (HYDRODIURIL) tablet 25 mg  25 mg Oral Daily Rankin, Shuvon B, NP   25 mg at 07/17/17 6387  . hydrOXYzine (ATARAX/VISTARIL) tablet 25 mg  25 mg Oral TID PRN Rankin, Shuvon B, NP   25 mg at 07/16/17 2209  . ibuprofen (ADVIL,MOTRIN) tablet 800 mg  800 mg Oral Q8H PRN Money, Lowry Ram, FNP      . lamoTRIgine (LAMICTAL) tablet 25 mg  25 mg Oral Daily Rankin, Shuvon B, NP   25 mg at 07/17/17 5643  . magnesium hydroxide (MILK OF MAGNESIA) suspension 30 mL  30 mL Oral Daily PRN Rankin, Shuvon B, NP      . mirtazapine (REMERON) tablet 7.5 mg  7.5 mg Oral QHS Money, Travis B, FNP   7.5 mg at 07/17/17 2109  . pantoprazole (PROTONIX) EC tablet 40 mg  40 mg Oral Daily Rankin, Shuvon B, NP   40 mg at 07/17/17 3295  . traZODone (DESYREL) tablet 50 mg  50 mg Oral QHS PRN Rankin, Shuvon B, NP   50 mg at 07/17/17 2109  . venlafaxine XR (EFFEXOR-XR) 24 hr capsule 37.5 mg  37.5 mg Oral Q  breakfast Cobos, Myer Peer, MD   37.5 mg at 07/17/17 1884    PTA Medications: Prescriptions Prior to Admission  Medication Sig Dispense Refill Last Dose  . atorvastatin (LIPITOR) 20 MG tablet Take 20 mg by mouth daily.   Past Week at Unknown time  . buPROPion (WELLBUTRIN XL) 300 MG 24 hr tablet Take 300 mg by mouth daily.   Past Week at Unknown time  . clonazePAM (KLONOPIN) 0.5 MG tablet Take 0.5 mg by mouth at bedtime.   Past Week at Unknown time  . desvenlafaxine (PRISTIQ) 50 MG 24 hr tablet Take 25-50 mg by mouth daily. 25 mg x 7 days then 50 mg daily started 07/10/17   Past Week at Unknown time  . hydrochlorothiazide (HYDRODIURIL) 25 MG tablet Take 25 mg by mouth daily.   Past Week at Unknown time  . aspirin EC 81 MG tablet Take 81 mg by mouth daily.   02/28/2017 at Unknown time    Treatment Modalities: Medication Management, Group therapy, Case management,  1 to 1 session with clinician, Psychoeducation, Recreational therapy.  Patient Stressors: Other: "I feel like a burden to my family."  Patient Strengths: Average or above average intelligence Communication skills General fund of knowledge Supportive family/friends  Physician Treatment Plan for Primary  Diagnosis: MDD (major depressive disorder), single episode, severe with psychosis ([place]) Long Term Goal(s): Improvement in symptoms so as ready for discharge  Short Term Goals: Ability to identify changes in lifestyle to reduce recurrence of condition will improve Ability to disclose and discuss suicidal ideas Ability to identify and develop effective coping behaviors will improve Compliance with prescribed medications will improve Ability to identify changes in lifestyle to reduce recurrence of condition will improve Ability to demonstrate self-control will improve Ability to maintain clinical measurements within normal limits will improve Ability to identify triggers associated with substance abuse/mental health issues will  improve  Medication Management: Evaluate patient's response, side effects, and tolerance of medication regimen.  Therapeutic Interventions: 1 to 1 sessions, Unit Group sessions and Medication administration.  Evaluation of Outcomes: Progressing  Physician Treatment Plan for Secondary Diagnosis: Principal Problem:   MDD (major depressive disorder), single episode, severe with psychosis ([place]) Active Problems:   Hypokalemia   HTN (hypertension)   Hyperlipidemia   GERD (gastroesophageal reflux disease)   Long Term Goal(s): Improvement in symptoms so as ready for discharge  Short Term Goals: Ability to identify changes in lifestyle to reduce recurrence of condition will improve Ability to disclose and discuss suicidal ideas Ability to identify and develop effective coping behaviors will improve Compliance with prescribed medications will improve Ability to identify changes in lifestyle to reduce recurrence of condition will improve Ability to demonstrate self-control will improve Ability to maintain clinical measurements within normal limits will improve Ability to identify triggers associated with substance abuse/mental health issues will improve  Medication Management: Evaluate patient's response, side effects, and tolerance of medication regimen.  Therapeutic Interventions: 1 to 1 sessions, Unit Group sessions and Medication administration.  Evaluation of Outcomes: Progressing   RN Treatment Plan for Primary Diagnosis: MDD (major depressive disorder), single episode, severe with psychosis ([place]) Long Term Goal(s): Knowledge of disease and therapeutic regimen to maintain health will improve  Short Term Goals: Ability to disclose and discuss suicidal ideas and Ability to identify and develop effective coping behaviors will improve  Medication Management: RN will administer medications as ordered by provider, will assess and evaluate patient's response and provide education to  patient for prescribed medication. RN will report any adverse and/or side effects to prescribing provider.  Therapeutic Interventions: 1 on 1 counseling sessions, Psychoeducation, Medication administration, Evaluate responses to treatment, Monitor vital signs and CBGs as ordered, Perform/monitor CIWA, COWS, AIMS and Fall Risk screenings as ordered, Perform wound care treatments as ordered.  Evaluation of Outcomes: Progressing   LCSW Treatment Plan for Primary Diagnosis: MDD (major depressive disorder), single episode, severe with psychosis ([place]) Long Term Goal(s): Safe transition to appropriate next level of care at discharge, Engage patient in therapeutic group addressing interpersonal concerns.  Short Term Goals: Engage patient in aftercare planning with referrals and resources and Increase skills for wellness and recovery  Therapeutic Interventions: Assess for all discharge needs, 1 to 1 time with Social worker, Explore available resources and support systems, Assess for adequacy in community support network, Educate family and significant other(s) on suicide prevention, Complete Psychosocial Assessment, Interpersonal group therapy.  Evaluation of Outcomes: Progressing   Progress in Treatment: Attending groups: Yes Participating in groups: Intermittently Taking medication as prescribed: Yes, MD continues to assess for medication changes as needed Toleration medication: Yes, no side effects reported at this time Family/Significant other contact made: Yes with daughter Patient understands diagnosis: Continuing to assess Discussing patient identified problems/goals with staff: Yes Medical problems stabilized or resolved:  Yes Denies suicidal/homicidal ideation: Yes Issues/concerns per patient self-inventory: None Other: N/A  New problem(s) identified: None identified at this time.   New Short Term/Long Term Goal(s): None identified at this time.   Discharge Plan or Barriers: Pt will  return home and follow-up with outpatient services at the [facility]  Reason for Continuation of Hospitalization: Anxiety Depression Medication stabilization  Estimated Length of Stay: 0 days  Attendees: Patient:  07/18/2017  7:38 AM  Physician: Dr. Parke Poisson, MD 07/18/2017  7:38 AM  Nursing: RN 07/18/2017  7:38 AM  RN Care Manager: Lars Pinks, RN 07/18/2017  7:38 AM  Social Worker: Adriana Reams, LCSW 07/18/2017  7:38 AM  Recreational Therapist:  07/18/2017  7:38 AM  Other: Lindell Spar, NP;  07/18/2017  7:38 AM  Other:  07/18/2017  7:38 AM  Other: 07/18/2017  7:38 AM    Scribe for Treatment Team: Gladstone Lighter, LCSW 07/18/2017 7:38 AM

## 2017-07-18 NOTE — Progress Notes (Signed)
D: Pt observe on the hallway interacting with staff. Pt at the time of assessment endorsed moderate depression; "I feel much better; glad my bf came." Pt denied SI, HI, depression, anxiety, or AVH. Pt remained very pleasant.  A: Medications offered as prescribed. Pt given the opportunity to ask questions and state concerns. Support, encouragement, and safe environment provided. R: Pt was med compliant. All patient's questions and concerns addressed. 15-minute safety checks continue. Safety checks continue. Pt did attend wrap-up group.

## 2017-09-20 ENCOUNTER — Emergency Department (HOSPITAL_COMMUNITY)
Admission: EM | Admit: 2017-09-20 | Discharge: 2017-09-21 | Disposition: A | Discharge status: Other Acute Inpatient Hospital | Payer: Medicaid Other | Attending: Emergency Medicine | Admitting: Emergency Medicine

## 2017-09-20 ENCOUNTER — Encounter (HOSPITAL_COMMUNITY): Payer: Self-pay | Admitting: *Deleted

## 2017-09-20 ENCOUNTER — Other Ambulatory Visit: Payer: Self-pay

## 2017-09-20 DIAGNOSIS — F1220 Cannabis dependence, uncomplicated: Secondary | ICD-10-CM | POA: Insufficient documentation

## 2017-09-20 DIAGNOSIS — Z79899 Other long term (current) drug therapy: Secondary | ICD-10-CM | POA: Insufficient documentation

## 2017-09-20 DIAGNOSIS — T426X2A Poisoning by other antiepileptic and sedative-hypnotic drugs, intentional self-harm, initial encounter: Secondary | ICD-10-CM | POA: Insufficient documentation

## 2017-09-20 DIAGNOSIS — F323 Major depressive disorder, single episode, severe with psychotic features: Secondary | ICD-10-CM | POA: Diagnosis present

## 2017-09-20 DIAGNOSIS — F332 Major depressive disorder, recurrent severe without psychotic features: Secondary | ICD-10-CM | POA: Insufficient documentation

## 2017-09-20 DIAGNOSIS — T50902A Poisoning by unspecified drugs, medicaments and biological substances, intentional self-harm, initial encounter: Secondary | ICD-10-CM

## 2017-09-20 DIAGNOSIS — Z7982 Long term (current) use of aspirin: Secondary | ICD-10-CM | POA: Diagnosis not present

## 2017-09-20 DIAGNOSIS — I10 Essential (primary) hypertension: Secondary | ICD-10-CM | POA: Insufficient documentation

## 2017-09-20 DIAGNOSIS — F329 Major depressive disorder, single episode, unspecified: Secondary | ICD-10-CM | POA: Diagnosis not present

## 2017-09-20 HISTORY — DX: Poisoning by unspecified drugs, medicaments and biological substances, intentional self-harm, initial encounter: T50.902A

## 2017-09-20 LAB — RAPID URINE DRUG SCREEN, HOSP PERFORMED
AMPHETAMINES: NOT DETECTED
BENZODIAZEPINES: NOT DETECTED
Barbiturates: NOT DETECTED
Cocaine: NOT DETECTED
OPIATES: NOT DETECTED
TETRAHYDROCANNABINOL: POSITIVE — AB

## 2017-09-20 LAB — I-STAT BETA HCG BLOOD, ED (MC, WL, AP ONLY): I-stat hCG, quantitative: <5 m[IU]/mL (ref <5)

## 2017-09-20 LAB — COMPREHENSIVE METABOLIC PANEL
ALBUMIN: 3.4 g/dL — AB (ref 3.5–5.0)
ALK PHOS: 51 U/L (ref 38–126)
ALT: 29 U/L (ref 14–54)
AST: 24 U/L (ref 15–41)
Anion gap: 7 (ref 5–15)
BUN: 13 mg/dL (ref 6–20)
CALCIUM: 8.3 mg/dL — AB (ref 8.9–10.3)
CO2: 26 mmol/L (ref 22–32)
Chloride: 106 mmol/L (ref 101–111)
Creatinine, Ser: 0.85 mg/dL (ref 0.44–1.00)
GFR calc Af Amer: >60 mL/min (ref >60)
GFR calc non Af Amer: >60 mL/min (ref >60)
GLUCOSE: 126 mg/dL — AB (ref 65–99)
Potassium: 3.2 mmol/L — ABNORMAL LOW (ref 3.5–5.1)
Sodium: 139 mmol/L (ref 135–145)
Total Bilirubin: 0.5 mg/dL (ref 0.3–1.2)
Total Protein: 6.1 g/dL — ABNORMAL LOW (ref 6.5–8.1)

## 2017-09-20 LAB — CBC WITH DIFFERENTIAL/PLATELET
BASOS ABS: 0.1 10*3/uL (ref 0.0–0.1)
BASOS PCT: 0 %
Eosinophils Absolute: 0.2 10*3/uL (ref 0.0–0.7)
Eosinophils Relative: 2 %
HEMATOCRIT: 37.1 % (ref 36.0–46.0)
HEMOGLOBIN: 12.5 g/dL (ref 12.0–15.0)
Lymphocytes Relative: 25 %
Lymphs Abs: 3.3 10*3/uL (ref 0.7–4.0)
MCH: 29.8 pg (ref 26.0–34.0)
MCHC: 33.7 g/dL (ref 30.0–36.0)
MCV: 88.5 fL (ref 78.0–100.0)
MONOS PCT: 5 %
Monocytes Absolute: 0.7 10*3/uL (ref 0.1–1.0)
NEUTROS ABS: 9 10*3/uL — AB (ref 1.7–7.7)
NEUTROS PCT: 68 %
Platelets: 481 10*3/uL — ABNORMAL HIGH (ref 150–400)
RBC: 4.19 MIL/uL (ref 3.87–5.11)
RDW: 15.6 % — ABNORMAL HIGH (ref 11.5–15.5)
WBC: 13.3 10*3/uL — AB (ref 4.0–10.5)

## 2017-09-20 LAB — ETHANOL: Alcohol, Ethyl (B): <10 mg/dL (ref <10)

## 2017-09-20 LAB — SALICYLATE LEVEL: Salicylate Lvl: <7 mg/dL (ref 2.8–30.0)

## 2017-09-20 LAB — ACETAMINOPHEN LEVEL: Acetaminophen (Tylenol), Serum: <10 ug/mL — ABNORMAL LOW (ref 10–30)

## 2017-09-20 MED ORDER — ACETAMINOPHEN 500 MG PO TABS: 500.0000 mg | ORAL_TABLET | Freq: Four times a day (QID) | ORAL | Status: DC | PRN

## 2017-09-20 MED ORDER — SODIUM CHLORIDE 0.9 % IV BOLUS (SEPSIS)
1000.0000 mL | Freq: Once | INTRAVENOUS | Status: AC
  Administered 2017-09-20: 1000 mL via INTRAVENOUS

## 2017-09-20 MED ORDER — LORAZEPAM 2 MG/ML IJ SOLN
2.0000 mg | Freq: Once | INTRAMUSCULAR | Status: AC
  Administered 2017-09-20: 2 mg via INTRAVENOUS
  Filled 2017-09-20: qty 1

## 2017-09-20 MED ORDER — LORAZEPAM 1 MG PO TABS
1.0000 mg | ORAL_TABLET | ORAL | Status: DC | PRN
  Administered 2017-09-21: 1 mg via ORAL
  Filled 2017-09-20: qty 1

## 2017-09-20 MED ORDER — HALOPERIDOL LACTATE 5 MG/ML IJ SOLN
2.0000 mg | Freq: Once | INTRAMUSCULAR | Status: AC
Start: 2017-09-20 — End: 2017-09-20
  Administered 2017-09-20: 2 mg via INTRAVENOUS
  Filled 2017-09-20: qty 1

## 2017-09-20 MED ORDER — HYDROCHLOROTHIAZIDE 25 MG PO TABS
25.0000 mg | ORAL_TABLET | Freq: Every day | ORAL | Status: DC
  Administered 2017-09-21: 25 mg via ORAL
  Filled 2017-09-20: qty 1

## 2017-09-20 NOTE — ED Triage Notes (Signed)
EMS and [employer] brought pt in, Pt attempted Suicide with overdose of unknown amount of Lamactil 30-60 pills unknown dosage. En route grabbing objects to harm self, "I just want to die"  States she is a burden to her children. #20 Rt AC. 4 mg Zofran due to active vomiting.
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
## 2017-09-20 NOTE — ED Provider Notes (Signed)
[facility] DEPT Provider Note   CSN: [id number] Arrival date & time: 09/20/17  1519     History   Chief Complaint Chief Complaint  Patient presents with  . Drug Overdose    HPI Alicia Robinson is a 42 y.o. female hx of depression, HTN, here with depression, suicidal attempt. Patient has been depressed. She states that she felt that she is a burden to herself. She took 30-60 lamictal pills (she is prescribed 50 mg tablets) just prior to arrival. She told paramedics that she "just wanted to die". She also told police to put her in jail so that she can die. She had previous suicide attempt and was recently admitted to [facility] for suicidal ideation, depression. She was placed in hand cuffs on arrival since she was agitated.   The history is provided by the patient.    Past Medical History:  Diagnosis Date  . Depression   . Hypertension   . Suicidal overdose [facility])     Patient Active Problem List   Diagnosis Date Noted  . Hyperlipidemia 07/14/2017  . GERD (gastroesophageal reflux disease) 07/14/2017  . Hypokalemia 07/13/2017  . HTN (hypertension) 07/13/2017  . MDD (major depressive disorder), single episode, severe with psychosis ([place]) 07/11/2017    Past Surgical History:  Procedure Laterality Date  . CHOLECYSTECTOMY    . TUBAL LIGATION      OB History    No data available       Home Medications    Prior to Admission medications   Medication Sig Start Date End Date Taking? Authorizing Provider  aspirin EC 81 MG tablet Take 81 mg by mouth daily.    [provider]  atorvastatin (LIPITOR) 20 MG tablet Take 20 mg by mouth daily.    [provider]  clonazePAM (KLONOPIN) 0.5 MG tablet Take 0.5 mg by mouth every evening. 07/04/17   [provider]  hydrochlorothiazide (HYDRODIURIL) 25 MG tablet Take 1 tablet (25 mg total) by mouth daily. 07/19/17   Derrill Center, NP  lamoTRIgine (LAMICTAL) 25 MG tablet Take 1 tablet  (25 mg total) by mouth daily. 07/19/17   Derrill Center, NP  mirtazapine (REMERON) 7.5 MG tablet Take 1 tablet (7.5 mg total) by mouth at bedtime. 07/18/17   Derrill Center, NP  omeprazole (PRILOSEC) 20 MG capsule Take 1 capsule (20 mg total) by mouth daily. 07/18/17   Derrill Center, NP  PARoxetine (PAXIL) 10 MG tablet Take 10 mg by mouth every evening. 07/28/17   [provider]  traZODone (DESYREL) 50 MG tablet Take 1 tablet (50 mg total) by mouth at bedtime as needed for sleep. 07/18/17   Derrill Center, NP  venlafaxine XR (EFFEXOR-XR) 150 MG 24 hr capsule Take 150 mg by mouth daily. 07/30/17   [provider]  venlafaxine XR (EFFEXOR-XR) 75 MG 24 hr capsule Take 1 capsule (75 mg total) by mouth daily with breakfast. Patient not taking: Reported on 09/20/2017 07/19/17   Derrill Center, NP    Family History No family history on file.  Social History Social History   Tobacco Use  . Smoking status: Never Smoker  . Smokeless tobacco: Never Used  Substance Use Topics  . Alcohol use: Yes    Comment: weekends  . Drug use: Yes    Types: Marijuana     Allergies   Patient has no known allergies.   Review of Systems Review of Systems  Psychiatric/Behavioral: Positive for agitation, behavioral  problems, dysphoric mood and suicidal ideas.  All other systems reviewed and are negative.    Physical Exam Updated Vital Signs BP 111/77   Pulse 100   Resp (!) 34   SpO2 100%   Physical Exam  Constitutional: She is oriented to person, place, and time.  Depressed, suicidal, tearful   HENT:  Head: Normocephalic.  Mouth/Throat: Oropharynx is clear and moist.  Eyes: EOM are normal. Pupils are equal, round, and reactive to light.  4 mm bilaterally, reactive   Neck: Normal range of motion. Neck supple.  Cardiovascular: Normal rate, regular rhythm and normal heart sounds.  Pulmonary/Chest: Effort normal and breath sounds normal. No stridor. No respiratory distress.    Abdominal: Soft. Bowel sounds are normal. She exhibits no distension. There is no tenderness.  Musculoskeletal: Normal range of motion.  Neurological: She is alert and oriented to person, place, and time.  Skin: Skin is warm.  Psychiatric:  Depressed, suicidal   Nursing note and vitals reviewed.    ED Treatments / Results  Labs (all labs ordered are listed, but only abnormal results are displayed) Labs Reviewed  CBC WITH DIFFERENTIAL/PLATELET - Abnormal; Notable for the following components:      Result Value   WBC 13.3 (*)    RDW 15.6 (*)    Platelets 481 (*)    Neutro Abs 9.0 (*)    All other components within normal limits  COMPREHENSIVE METABOLIC PANEL - Abnormal; Notable for the following components:   Potassium 3.2 (*)    Glucose, Bld 126 (*)    Calcium 8.3 (*)    Total Protein 6.1 (*)    Albumin 3.4 (*)    All other components within normal limits  ACETAMINOPHEN LEVEL - Abnormal; Notable for the following components:   Acetaminophen (Tylenol), Serum <10 (*)    All other components within normal limits  RAPID URINE DRUG SCREEN, HOSP PERFORMED - Abnormal; Notable for the following components:   Tetrahydrocannabinol POSITIVE (*)    All other components within normal limits  ETHANOL  SALICYLATE LEVEL  I-STAT BETA HCG BLOOD, ED ([facility], [facility], [facility] ONLY)    EKG  EKG Interpretation  Date/Time:  Saturday September 20 2017 15:36:16 EST Ventricular Rate:  95 PR Interval:    QRS Duration: 88 QT Interval:  345 QTC Calculation: 434 R Axis:   75 Text Interpretation:  Sinus rhythm Prolonged PR interval Right atrial enlargement Nonspecific T abnormalities, inferior leads No significant change since last tracing Confirmed by Wandra Arthurs [phone]) on 09/20/2017 3:58:45 PM       Radiology No results found.  Procedures Procedures (including critical care time)  Medications Ordered in ED Medications  haloperidol lactate (HALDOL) injection 2 mg (2 mg Intravenous Given 09/20/17 1527)   LORazepam (ATIVAN) injection 2 mg (2 mg Intravenous Given 09/20/17 1527)  sodium chloride 0.9 % bolus 1,000 mL (0 mLs Intravenous Stopped 09/20/17 1657)     Initial Impression / Assessment and Plan / ED Course  I have reviewed the triage vital signs and the nursing notes.  Pertinent labs & imaging results that were available during my care of the patient were reviewed by me and considered in my medical decision making (see chart for details).     Alicia Robinson is a 42 y.o. female here with suicidal attempt, depression. Patient took 30-60 lamictal pills in attempt to kill herself. Patient was put on 4 point restraints for agitation. Will give ativan, haldol. Will get medical clearance labs, EKG and  call poison control.   6:54 PM Poison control recommend observe until 9 pm and if she is awake, she can be cleared. Labs unremarkable. Holding orders placed. Will move to TCU.   Final Clinical Impressions(s) / ED Diagnoses   Final diagnoses:  None    ED Discharge Orders    None       Drenda Freeze, MD 09/20/17 [phone]

## 2017-09-20 NOTE — ED Notes (Signed)
Bed: RESA Expected date: 09/20/17 Expected time: 3:23 PM Means of arrival: Ambulance Comments: OD Lamictal

## 2017-09-20 NOTE — BHH Counselor (Addendum)
Clinician attempted to engage the pt in TTS consult however the pt is unable to engage. Clinician asked the pt's RN to inform her when the pt is roused. Dr. Darl Householder is in agreement.    Vertell Novak, MS, [facility], [facility] Triage Specialist [phone]

## 2017-09-20 NOTE — BHH Counselor (Signed)
Clinician checked in with the pt's nurse and noted the pt is currently sleeping. Clinician asked pt's nurse to inform her when the pt is roused.  Update discussed with Dr. Darl Householder.   Vertell Novak, MS, [facility], [facility] Triage Specialist [phone]

## 2017-09-20 NOTE — ED Notes (Signed)
Pt has given verbal consent to keep her daughters updated. Felix Pacini at  [phone] and Leetta Hendriks at [phone].

## 2017-09-21 ENCOUNTER — Inpatient Hospital Stay (HOSPITAL_COMMUNITY)
Admission: AD | Admit: 2017-09-21 | Discharge: 2017-09-24 | DRG: 885 | Disposition: A | Discharge status: Home / Self Care | Payer: Medicaid Other | Attending: Psychiatry | Admitting: Psychiatry

## 2017-09-21 ENCOUNTER — Other Ambulatory Visit: Payer: Self-pay

## 2017-09-21 ENCOUNTER — Encounter (HOSPITAL_COMMUNITY): Payer: Self-pay

## 2017-09-21 DIAGNOSIS — R45851 Suicidal ideations: Secondary | ICD-10-CM | POA: Diagnosis present

## 2017-09-21 DIAGNOSIS — Z79899 Other long term (current) drug therapy: Secondary | ICD-10-CM | POA: Diagnosis not present

## 2017-09-21 DIAGNOSIS — F1210 Cannabis abuse, uncomplicated: Secondary | ICD-10-CM | POA: Diagnosis present

## 2017-09-21 DIAGNOSIS — E785 Hyperlipidemia, unspecified: Secondary | ICD-10-CM | POA: Diagnosis present

## 2017-09-21 DIAGNOSIS — F329 Major depressive disorder, single episode, unspecified: Secondary | ICD-10-CM | POA: Diagnosis present

## 2017-09-21 DIAGNOSIS — I10 Essential (primary) hypertension: Secondary | ICD-10-CM | POA: Diagnosis present

## 2017-09-21 DIAGNOSIS — Z7982 Long term (current) use of aspirin: Secondary | ICD-10-CM

## 2017-09-21 DIAGNOSIS — F419 Anxiety disorder, unspecified: Secondary | ICD-10-CM | POA: Diagnosis present

## 2017-09-21 DIAGNOSIS — R450 Nervousness: Secondary | ICD-10-CM | POA: Diagnosis not present

## 2017-09-21 DIAGNOSIS — G4700 Insomnia, unspecified: Secondary | ICD-10-CM | POA: Diagnosis present

## 2017-09-21 DIAGNOSIS — T1491XA Suicide attempt, initial encounter: Secondary | ICD-10-CM | POA: Diagnosis not present

## 2017-09-21 DIAGNOSIS — Z915 Personal history of self-harm: Secondary | ICD-10-CM | POA: Diagnosis not present

## 2017-09-21 DIAGNOSIS — Z638 Other specified problems related to primary support group: Secondary | ICD-10-CM | POA: Diagnosis not present

## 2017-09-21 DIAGNOSIS — T426X2A Poisoning by other antiepileptic and sedative-hypnotic drugs, intentional self-harm, initial encounter: Secondary | ICD-10-CM | POA: Diagnosis not present

## 2017-09-21 DIAGNOSIS — B373 Candidiasis of vulva and vagina: Secondary | ICD-10-CM | POA: Diagnosis present

## 2017-09-21 DIAGNOSIS — F332 Major depressive disorder, recurrent severe without psychotic features: Principal | ICD-10-CM | POA: Diagnosis present

## 2017-09-21 DIAGNOSIS — F1910 Other psychoactive substance abuse, uncomplicated: Secondary | ICD-10-CM | POA: Diagnosis not present

## 2017-09-21 DIAGNOSIS — F4010 Social phobia, unspecified: Secondary | ICD-10-CM | POA: Diagnosis not present

## 2017-09-21 MED ORDER — ACETAMINOPHEN 325 MG PO TABS
650.0000 mg | ORAL_TABLET | Freq: Four times a day (QID) | ORAL | Status: DC | PRN
  Administered 2017-09-21 – 2017-09-23 (×3): 650 mg via ORAL
  Filled 2017-09-21 (×3): qty 2

## 2017-09-21 MED ORDER — MIRTAZAPINE 15 MG PO TBDP
7.5000 mg | ORAL_TABLET | Freq: Every day | ORAL | Status: DC
  Filled 2017-09-21: qty 0.5

## 2017-09-21 MED ORDER — MAGNESIUM HYDROXIDE 400 MG/5ML PO SUSP: 30.0000 mL | Freq: Every day | ORAL | Status: DC | PRN

## 2017-09-21 MED ORDER — MIRTAZAPINE 7.5 MG PO TABS
7.5000 mg | ORAL_TABLET | Freq: Every day | ORAL | Status: DC
  Administered 2017-09-21: 7.5 mg via ORAL
  Filled 2017-09-21 (×4): qty 1

## 2017-09-21 MED ORDER — TRAZODONE HCL 50 MG PO TABS: 50.0000 mg | ORAL_TABLET | Freq: Every evening | ORAL | Status: DC | PRN

## 2017-09-21 MED ORDER — POTASSIUM CHLORIDE CRYS ER 20 MEQ PO TBCR
40.0000 meq | EXTENDED_RELEASE_TABLET | Freq: Once | ORAL | Status: AC
  Administered 2017-09-21: 40 meq via ORAL
  Filled 2017-09-21: qty 2

## 2017-09-21 MED ORDER — ASPIRIN 81 MG PO CHEW
81.0000 mg | CHEWABLE_TABLET | Freq: Every day | ORAL | Status: DC
  Administered 2017-09-21 – 2017-09-24 (×4): 81 mg via ORAL
  Filled 2017-09-21 (×7): qty 1

## 2017-09-21 MED ORDER — ALUM & MAG HYDROXIDE-SIMETH 200-200-20 MG/5ML PO SUSP: 30.0000 mL | ORAL | Status: DC | PRN

## 2017-09-21 MED ORDER — ONDANSETRON 4 MG PO TBDP
4.0000 mg | ORAL_TABLET | Freq: Three times a day (TID) | ORAL | Status: DC | PRN
  Administered 2017-09-21: 4 mg via ORAL
  Filled 2017-09-21: qty 1

## 2017-09-21 MED ORDER — HYDROXYZINE HCL 25 MG PO TABS
25.0000 mg | ORAL_TABLET | Freq: Three times a day (TID) | ORAL | Status: DC | PRN
  Administered 2017-09-21 – 2017-09-23 (×4): 25 mg via ORAL
  Filled 2017-09-21 (×4): qty 1
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
## 2017-09-21 NOTE — Tx Team (Signed)
Initial Treatment Plan 09/21/2017 4:46 PM Alicia Robinson ZNB:[id number]    PATIENT STRESSORS: Financial difficulties Other: living arrangements   PATIENT STRENGTHS: Communication skills Motivation for treatment/growth Physical Health Work skills   PATIENT IDENTIFIED PROBLEMS:   "Hopelessness"     "Depression"    " My kids don't understand my sickness"           DISCHARGE CRITERIA:  Adequate post-discharge living arrangements Improved stabilization in mood, thinking, and/or behavior Reduction of life-threatening or endangering symptoms to within safe limits Verbal commitment to aftercare and medication compliance  PRELIMINARY DISCHARGE PLAN: Attend aftercare/continuing care group Outpatient therapy Placement in alternative living arrangements Return to previous work or school arrangements  PATIENT/FAMILY INVOLVEMENT: This treatment plan has been presented to and reviewed with the patient, Alicia Robinson, a The patient has been given the opportunity to ask questions and make suggestions.  [name], RN 09/21/2017, 4:46 PM

## 2017-09-21 NOTE — BHH Group Notes (Addendum)
Clinician noted the pt is still sleeping. TTS consult will be completed during day shift.    Vertell Novak, MS, [facility], [facility] Triage Specialist [phone]

## 2017-09-21 NOTE — ED Notes (Signed)
Pt alert and oriented all day since 0700. No restraints applied, pt follows commands, very pleasant behavior. Daughter has visited a few times. Pt has not shown any violent behavior today.

## 2017-09-21 NOTE — Progress Notes (Signed)
Patient did attend the evening speaker AA meeting although I believe she should be programing on the 400 hall.

## 2017-09-22 DIAGNOSIS — F4010 Social phobia, unspecified: Secondary | ICD-10-CM

## 2017-09-22 DIAGNOSIS — T1491XA Suicide attempt, initial encounter: Secondary | ICD-10-CM

## 2017-09-22 DIAGNOSIS — F332 Major depressive disorder, recurrent severe without psychotic features: Principal | ICD-10-CM

## 2017-09-22 DIAGNOSIS — F1210 Cannabis abuse, uncomplicated: Secondary | ICD-10-CM

## 2017-09-22 DIAGNOSIS — R450 Nervousness: Secondary | ICD-10-CM

## 2017-09-22 DIAGNOSIS — G4700 Insomnia, unspecified: Secondary | ICD-10-CM

## 2017-09-22 DIAGNOSIS — T426X2A Poisoning by other antiepileptic and sedative-hypnotic drugs, intentional self-harm, initial encounter: Secondary | ICD-10-CM

## 2017-09-22 DIAGNOSIS — F419 Anxiety disorder, unspecified: Secondary | ICD-10-CM

## 2017-09-22 LAB — LIPID PANEL
CHOLESTEROL: 268 mg/dL — AB (ref 0–200)
HDL: 58 mg/dL (ref >40)
LDL Cholesterol: 189 mg/dL — ABNORMAL HIGH (ref 0–99)
TRIGLYCERIDES: 106 mg/dL (ref <150)
Total CHOL/HDL Ratio: 4.6 RATIO
VLDL: 21 mg/dL (ref 0–40)

## 2017-09-22 LAB — HEMOGLOBIN A1C
Hgb A1c MFr Bld: 5.3 % (ref 4.8–5.6)
Mean Plasma Glucose: 105.41 mg/dL

## 2017-09-22 LAB — TSH: TSH: 1.585 u[IU]/mL (ref 0.350–4.500)

## 2017-09-22 MED ORDER — FLUCONAZOLE 150 MG PO TABS
150.0000 mg | ORAL_TABLET | Freq: Every day | ORAL | Status: DC
  Administered 2017-09-22 – 2017-09-24 (×3): 150 mg via ORAL
  Filled 2017-09-22 (×5): qty 1

## 2017-09-22 MED ORDER — MIRTAZAPINE 15 MG PO TABS
15.0000 mg | ORAL_TABLET | Freq: Every day | ORAL | Status: DC
  Administered 2017-09-22 – 2017-09-23 (×2): 15 mg via ORAL
  Filled 2017-09-22 (×4): qty 1

## 2017-09-22 MED ORDER — ATORVASTATIN CALCIUM 20 MG PO TABS
20.0000 mg | ORAL_TABLET | Freq: Every day | ORAL | Status: DC
  Administered 2017-09-22 – 2017-09-23 (×2): 20 mg via ORAL
  Filled 2017-09-22 (×4): qty 1
  Filled 2017-09-22: qty 2

## 2017-09-22 MED ORDER — VENLAFAXINE HCL ER 37.5 MG PO CP24
37.5000 mg | ORAL_CAPSULE | Freq: Every day | ORAL | Status: DC
  Administered 2017-09-23 – 2017-09-24 (×2): 37.5 mg via ORAL
  Filled 2017-09-22 (×4): qty 1

## 2017-09-22 NOTE — Tx Team (Signed)
Interdisciplinary Treatment and Diagnostic Plan Update  09/22/2017 Time of Session: 0830AM Alicia Robinson MRN: [id number]  Principal Diagnosis: MDD, recurrent, severe  Secondary Diagnoses: Active Problems:   MDD (major depressive disorder)   Current Medications:  Current Facility-Administered Medications  Medication Dose Route Frequency Provider Last Rate Last Dose  . acetaminophen (TYLENOL) tablet 650 mg  650 mg Oral Q6H PRN Derrill Center, NP   650 mg at 09/22/17 0801  . alum & mag hydroxide-simeth (MAALOX/MYLANTA) 200-200-20 MG/5ML suspension 30 mL  30 mL Oral Q4H PRN Derrill Center, NP      . aspirin chewable tablet 81 mg  81 mg Oral Daily Derrill Center, NP   81 mg at 09/22/17 0759  . hydrOXYzine (ATARAX/VISTARIL) tablet 25 mg  25 mg Oral TID PRN Derrill Center, NP   25 mg at 09/22/17 0801  . magnesium hydroxide (MILK OF MAGNESIA) suspension 30 mL  30 mL Oral Daily PRN Derrill Center, NP      . mirtazapine (REMERON) tablet 7.5 mg  7.5 mg Oral QHS Derrill Center, NP   7.5 mg at 09/21/17 2151   PTA Medications: Medications Prior to Admission  Medication Sig Dispense Refill Last Dose  . aspirin EC 81 MG tablet Take 81 mg by mouth daily.   02/28/2017 at Unknown time  . atorvastatin (LIPITOR) 20 MG tablet Take 20 mg by mouth daily.   Past Week at Unknown time  . clonazePAM (KLONOPIN) 0.5 MG tablet Take 0.5 mg by mouth every evening.  0   . hydrochlorothiazide (HYDRODIURIL) 25 MG tablet Take 1 tablet (25 mg total) by mouth daily. 30 tablet 0   . lamoTRIgine (LAMICTAL) 25 MG tablet Take 1 tablet (25 mg total) by mouth daily. 30 tablet 0   . mirtazapine (REMERON) 7.5 MG tablet Take 1 tablet (7.5 mg total) by mouth at bedtime. 30 tablet 0   . omeprazole (PRILOSEC) 20 MG capsule Take 1 capsule (20 mg total) by mouth daily. 30 capsule 0   . PARoxetine (PAXIL) 10 MG tablet Take 10 mg by mouth every evening.  2   . traZODone (DESYREL) 50 MG tablet Take 1 tablet (50 mg total) by mouth at  bedtime as needed for sleep. 30 tablet 0   . venlafaxine XR (EFFEXOR-XR) 150 MG 24 hr capsule Take 150 mg by mouth daily.  0   . venlafaxine XR (EFFEXOR-XR) 75 MG 24 hr capsule Take 1 capsule (75 mg total) by mouth daily with breakfast. 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   Physician Treatment Plan for Primary Diagnosis:MDD, recurrent, severe  Medication Management: Evaluate patient's response, side effects, and tolerance of medication regimen.  Therapeutic Interventions: 1 to 1 sessions, Unit Group sessions and Medication administration.  Evaluation of Outcomes: Progressing  Physician Treatment Plan for Secondary Diagnosis: Active Problems:   MDD (major depressive disorder)   Medication Management: Evaluate patient's response, side effects, and tolerance of medication regimen.  Therapeutic Interventions: 1 to 1 sessions, Unit Group sessions and Medication administration.  Evaluation of Outcomes: Progressing   RN Treatment Plan for Primary Diagnosis:MDD, recurrent, severe Long Term Goal(s): Knowledge of disease and therapeutic regimen to maintain health will improve  Short  Term Goals: Ability to remain free from injury will improve, Ability to disclose and discuss suicidal ideas and Ability to identify and develop effective coping behaviors will improve  Medication Management: RN will administer medications as ordered by provider, will assess and evaluate patient's response and provide education to patient for prescribed medication. RN will report any adverse and/or side effects to prescribing  provider.  Therapeutic Interventions: 1 on 1 counseling sessions, Psychoeducation, Medication administration, Evaluate responses to treatment, Monitor vital signs and CBGs as ordered, Perform/monitor CIWA, COWS, AIMS and Fall Risk screenings as ordered, Perform wound care treatments as ordered.  Evaluation of Outcomes: Progressing   LCSW Treatment Plan for Primary Diagnosis: MDD, recurrent, severe Long Term Goal(s): Safe transition to appropriate next level of care at discharge, Engage patient in therapeutic group addressing interpersonal concerns.  Short Term Goals: Engage patient in aftercare planning with referrals and resources, Facilitate patient progression through stages of change regarding substance use diagnoses and concerns and Identify triggers associated with mental health/substance abuse issues  Therapeutic Interventions: Assess for all discharge needs, 1 to 1 time with Social worker, Explore available resources and support systems, Assess for adequacy in community support network, Educate family and significant other(s) on suicide prevention, Complete Psychosocial Assessment, Interpersonal group therapy.  Evaluation of Outcomes: Progressing   Progress in Treatment: Attending groups: No. New to unit. Continuing to assess.  Participating in groups: No. Taking medication as prescribed: Yes. Toleration medication: Yes. Family/Significant other contact made: No, will contact:  family member if patient consents Patient understands diagnosis: Yes. Discussing patient identified problems/goals with staff: Yes. Medical problems stabilized or resolved: Yes. Denies suicidal/homicidal ideation: Yes. Issues/concerns per patient self-inventory: No. Other: n/a   New problem(s) identified: No, Describe:  n/a  New Short Term/Long Term Goal(s): medication management for mood stabilization/detox, elimination of SI thoughts, development of comprehensive mental wellness/sobriety plan.    Discharge Plan or Barriers: CSW assessing for appropriate referrals. Pt was last admitted to [facility] 07/11/17 and was referred to both the [facility] [place]. Pt thinks her therapist at [facility] dropped her from services due to missing appts because of work.   Reason for Continuation of Hospitalization: Anxiety Depression Medication stabilization Suicidal ideation Withdrawal symptoms  Estimated Length of Stay: Wed, 09/24/17  Attendees: Patient: 09/22/2017 10:39 AM  Physician: Dr. Nancy Fetter MD; Dr. Sanjuana Letters MD 09/22/2017 10:39 AM  Nursing: Rayburn Felt RN 09/22/2017 10:39 AM  RN Care Manager:x 09/22/2017 10:39 AM  Social Worker: Press photographer, LCSW 09/22/2017 10:39 AM  Recreational Therapist: x 09/22/2017 10:39 AM  Other: Ricky Ala NP; Darnelle Maffucci Money NP 09/22/2017 10:39 AM  Other:  09/22/2017 10:39 AM  Other: 09/22/2017 10:39 AM    Scribe for Treatment Team: Kimber Relic Smart, LCSW 09/22/2017 10:39 AM

## 2017-09-22 NOTE — H&P (Signed)
Psychiatric Admission Assessment Adult  Patient Identification: Alicia Robinson MRN:  [id number] Date of Evaluation:  09/22/2017 Chief Complaint:  MDD Cannabis use Disorder Principal Diagnosis: MDD (major depressive disorder) Diagnosis:   Patient Active Problem List   Diagnosis Date Noted  . MDD (major depressive disorder) [F32.9] 09/21/2017  . Hyperlipidemia [E78.5] 07/14/2017  . GERD (gastroesophageal reflux disease) [K21.9] 07/14/2017  . Hypokalemia [E87.6] 07/13/2017  . HTN (hypertension) [I10] 07/13/2017  . MDD (major depressive disorder), single episode, severe with psychosis ([place]) [F32.3] 07/11/2017   History of Present Illness:Per assessment note- Alicia Robinson is a 42 y.o. female hx of depression, HTN, here with depression, suicidal attempt. Patient has been depressed. She states that she felt that she is a burden to herself. She took 30-60 lamictal pills (she is prescribed 50 mg tablets) just prior to arrival. She told paramedics that she "just wanted to die". She also told police to put her in jail so that she can die. She had previous suicide attempt and was recently admitted to [facility] for suicidal ideation, depression. She was placed in hand cuffs on arrival since she was agitated.   On Assessment:Alicia Robinson is awake, alert and oriented. Patient was evaluated by MD and NP. Reports ongoing depression with previous suicidal attempt.  Reports she attempted to overdose on medications due to a verbal disput with her daughhter (44 y.o). Reports multiple stressors  with having to work two jobs and living with her son (21y.o)  and his family.  Reports on going use of mariajuana to help with her anxiety. Patient validates the information that was provided with the above assessment. Discussed medication adjustment and encouraged patient to follow-up with DBT therapy.  Support, encouragement and reassurance was provided.    Associated Signs/Symptoms: Depression Symptoms:  depressed  mood, feelings of worthlessness/guilt, difficulty concentrating, hopelessness, suicidal thoughts without plan, anxiety, (Hypo) Manic Symptoms:  Distractibility, Impulsivity, Irritable Mood, Anxiety Symptoms:  Excessive Worry, Social Anxiety, Psychotic Symptoms:  Hallucinations: None PTSD Symptoms: Avoidance:  Decreased Interest/Participation Total Time spent with patient: 30 minutes  Past Psychiatric History:   Is the patient at risk to self? Yes.    Has the patient been a risk to self in the past 6 months? Yes.    Has the patient been a risk to self within the distant past? Yes.    Is the patient a risk to others? No.  Has the patient been a risk to others in the past 6 months? No.  Has the patient been a risk to others within the distant past? No.   Prior Inpatient Therapy:   Prior Outpatient Therapy:    Alcohol Screening: Patient refused Alcohol Screening Tool: Yes 1. How often do you have a drink containing alcohol?: Monthly or less 2. How many drinks containing alcohol do you have on a typical day when you are drinking?: 1 or 2 3. How often do you have six or more drinks on one occasion?: Never AUDIT-C Score: 1 Intervention/Follow-up: AUDIT Score <7 follow-up not indicated Substance Abuse History in the last 12 months:  Yes.   Consequences of Substance Abuse: NA Previous Psychotropic Medications: NO Psychological Evaluations: YES Past Medical History:  Past Medical History:  Diagnosis Date  . Depression   . Hypertension   . Suicidal overdose [facility])     Past Surgical History:  Procedure Laterality Date  . CHOLECYSTECTOMY    . TUBAL LIGATION     Family History: History reviewed. No pertinent family history. Family Psychiatric  History: Tobacco Screening: Have you used any form of tobacco in the last 30 days? (Cigarettes, Smokeless Tobacco, Cigars, and/or Pipes): No Social History:  Social History   Substance and Sexual Activity  Alcohol Use Yes   Comment:  weekends     Social History   Substance and Sexual Activity  Drug Use Yes  . Types: Marijuana    Additional Social History:                           Allergies:  No Known Allergies Lab Results:  Results for orders placed or performed during the hospital encounter of 09/21/17 (from the past 48 hour(s))  TSH     Status: None   Collection Time: 09/22/17  6:40 AM  Result Value Ref Range   TSH 1.585 0.350 - 4.500 uIU/mL    Comment: Performed by a 3rd Generation assay with a functional sensitivity of <=0.01 uIU/mL. Performed at [facility], [place] [address], [place], [place] [address]   Hemoglobin A1c     Status: None   Collection Time: 09/22/17  6:40 AM  Result Value Ref Range   Hgb A1c MFr Bld 5.3 4.8 - 5.6 %    Comment: (NOTE) Pre diabetes:          5.7%-6.4% Diabetes:              >6.4% Glycemic control for   <7.0% adults with diabetes    Mean Plasma Glucose 105.41 mg/dL    Comment: Performed at [facility] [address] [place], [place] [address]  Lipid panel     Status: Abnormal   Collection Time: 09/22/17  6:40 AM  Result Value Ref Range   Cholesterol 268 (H) 0 - 200 mg/dL   Triglycerides 106 <150 mg/dL   HDL 58 >40 mg/dL   Total CHOL/HDL Ratio 4.6 RATIO   VLDL 21 0 - 40 mg/dL   LDL Cholesterol 189 (H) 0 - 99 mg/dL    Comment:        Total Cholesterol/HDL:CHD Risk Coronary Heart Disease Risk Table                     Men   Women  1/2 Average Risk   3.4   3.3  Average Risk       5.0   4.4  2 X Average Risk   9.6   7.1  3 X Average Risk  23.4   11.0        Use the calculated Patient Ratio above and the CHD Risk Table to determine the patient's CHD Risk.        ATP III CLASSIFICATION (LDL):  <100     mg/dL   Optimal  100-129  mg/dL   Near or Above                    Optimal  130-159  mg/dL   Borderline  160-189  mg/dL   High  >190     mg/dL   Very High Performed at [facility] [address]  [place],  [address]     Blood Alcohol level:  Lab Results  Component Value Date   [facility] <10 09/20/2017   ETH <10 28/36/6294    Metabolic Disorder Labs:  Lab Results  Component Value Date   HGBA1C 5.3 09/22/2017   MPG 105.41 09/22/2017   MPG 108.28 07/11/2017  No results found for: PROLACTIN Lab Results  Component Value Date   CHOL 268 (H) 09/22/2017   TRIG 106 09/22/2017   HDL 58 09/22/2017   CHOLHDL 4.6 09/22/2017   VLDL 21 09/22/2017   LDLCALC 189 (H) 09/22/2017   LDLCALC 99 07/11/2017    Current Medications: Current Facility-Administered Medications  Medication Dose Route Frequency Provider Last Rate Last Dose  . acetaminophen (TYLENOL) tablet 650 mg  650 mg Oral Q6H PRN Derrill Center, NP   650 mg at 09/22/17 0801  . alum & mag hydroxide-simeth (MAALOX/MYLANTA) 200-200-20 MG/5ML suspension 30 mL  30 mL Oral Q4H PRN Derrill Center, NP      . aspirin chewable tablet 81 mg  81 mg Oral Daily Derrill Center, NP   81 mg at 09/22/17 0759  . fluconazole (DIFLUCAN) tablet 150 mg  150 mg Oral Daily Derrill Center, NP   150 mg at 09/22/17 1207  . hydrOXYzine (ATARAX/VISTARIL) tablet 25 mg  25 mg Oral TID PRN Derrill Center, NP   25 mg at 09/22/17 0801  . magnesium hydroxide (MILK OF MAGNESIA) suspension 30 mL  30 mL Oral Daily PRN Derrill Center, NP      . mirtazapine (REMERON) tablet 7.5 mg  7.5 mg Oral QHS Derrill Center, NP   7.5 mg at 09/21/17 2151   PTA Medications: Medications Prior to Admission  Medication Sig Dispense Refill Last Dose  . aspirin EC 81 MG tablet Take 81 mg by mouth daily.   02/28/2017 at Unknown time  . atorvastatin (LIPITOR) 20 MG tablet Take 20 mg by mouth daily.   Past Week at Unknown time  . clonazePAM (KLONOPIN) 0.5 MG tablet Take 0.5 mg by mouth every evening.  0   . hydrochlorothiazide (HYDRODIURIL) 25 MG tablet Take 1 tablet (25 mg total) by mouth daily. 30 tablet 0   . lamoTRIgine (LAMICTAL) 25 MG tablet Take 1 tablet (25 mg total) by  mouth daily. 30 tablet 0   . mirtazapine (REMERON) 7.5 MG tablet Take 1 tablet (7.5 mg total) by mouth at bedtime. 30 tablet 0   . omeprazole (PRILOSEC) 20 MG capsule Take 1 capsule (20 mg total) by mouth daily. 30 capsule 0   . PARoxetine (PAXIL) 10 MG tablet Take 10 mg by mouth every evening.  2   . traZODone (DESYREL) 50 MG tablet Take 1 tablet (50 mg total) by mouth at bedtime as needed for sleep. 30 tablet 0   . venlafaxine XR (EFFEXOR-XR) 150 MG 24 hr capsule Take 150 mg by mouth daily.  0   . venlafaxine XR (EFFEXOR-XR) 75 MG 24 hr capsule Take 1 capsule (75 mg total) by mouth daily with breakfast. (Patient not taking: Reported on 09/20/2017) 30 capsule 0 Not Taking at Unknown time    Musculoskeletal: Strength & Muscle Tone: within normal limits Gait & Station: normal Patient leans: N/A  Psychiatric Specialty Exam: Physical Exam  Vitals reviewed. Constitutional: She appears well-developed.  Cardiovascular: Normal rate.  Neurological: She is alert.  Psychiatric: She has a normal mood and affect. Her behavior is normal.    Review of Systems  Psychiatric/Behavioral: Positive for depression and suicidal ideas. The patient is nervous/anxious and has insomnia.     Blood pressure 117/87, pulse (!) 104, temperature 98.7 F (37.1 C), temperature source Oral, resp. rate 16, height 5' 10"  (1.778 m), weight 84.8 kg (187 lb), SpO2 97 %.Body mass index is 26.83 kg/m.  General Appearance: Casual  Eye Contact:  Fair  Speech:  Clear and Coherent  Volume:  Normal  Mood:  Depressed and Dysphoric  Affect:  Congruent  Thought Process:  Coherent  Orientation:  Full (Time, Place, and Person)  Thought Content:  Hallucinations: None  Suicidal Thoughts:  Yes.  with intent/plan  Homicidal Thoughts:  No  Memory:  Immediate;   Fair Remote;   Fair  Judgement:  Fair  Insight:  Present  Psychomotor Activity:  Restlessness  Concentration:  Concentration: Fair  Recall:  [employer] of Knowledge:   Fair  Language:  Fair  Akathisia:  No  Handed:  Right  AIMS (if indicated):     Assets:  Communication Skills Desire for Improvement Financial Resources/Insurance Housing Resilience Social Support Transportation  ADL's:  Intact  Cognition:  WNL  Sleep:  Number of Hours: 6.5     Treatment Plan Summary: Daily contact with patient to assess and evaluate symptoms and progress in treatment and Medication management   Start Effexor 37.59m for mood stabilization -with triation Continue with Remeron 118mfor insomnia  Vaginal Discharge   Diflucan 150 mg PO once  Will continue to monitor vitals ,medication compliance and treatment side effects while patient is here.  Reviewed labs: Triglycerides elevated 268 mg/dlBAL - , UDS - positive THC CSW will start working on disposition.  Patient to participate in therapeutic milieu   Observation Level/Precautions:  15 minute checks  Laboratory:  CBC HbAIC UDS UA  Psychotherapy:  Individual and group session  Medications:  See SRA by MD  Consultations:  Psychiatry   Discharge Concerns:  Safety, stabilization, and risk of access to medication and medication stabilization   Estimated LOS: 5-7 days  Other:     Physician Treatment Plan for Primary Diagnosis: MDD (major depressive disorder) Long Term Goal(s): Improvement in symptoms so as ready for discharge  Short Term Goals: Ability to identify changes in lifestyle to reduce recurrence of condition will improve, Ability to disclose and discuss suicidal ideas, Ability to identify and develop effective coping behaviors will improve and Compliance with prescribed medications will improve  Physician Treatment Plan for Secondary Diagnosis: Principal Problem:   MDD (major depressive disorder)  Long Term Goal(s): Improvement in symptoms so as ready for discharge  Short Term Goals: Ability to identify changes in lifestyle to reduce recurrence of condition will improve, Ability to  demonstrate self-control will improve, Ability to maintain clinical measurements within normal limits will improve and Ability to identify triggers associated with substance abuse/mental health issues will improve  I certify that inpatient services furnished can reasonably be expected to improve the patient's condition.    TaDerrill CenterNP 1/7/20192:15 PM   I have reviewed NP's Note, assessement, diagnosis and plan, and agree. I have also met with patient and completed suicide risk assessment.  MaKursten Kruks a [age]/o F with history of MDD and cannabis use disorder who was admitted voluntarily after suicide attempt overdose on 30-60 tablets of lamictal. [facility]  Upon initial presentation, pt reports she has been struggling with depression and anxiety for several weeks. [facility] after which time she reports continuing to struggle with depression. Pt shares details about the events prior to her suicide attempt, and she states, "I tried to commit suicide by taking too many pills. I had been fighting with my son and then I called my daughter and  we fought, and I thought, 'I'll show them.'" Pt shares that she took about 30 tablets of lamictal with intention of killing herself, and she did not tell her family until she began to shake and appear ill, at which time she told them and she was brought in for evaluation.  Pt endorses depressive symptoms of depressed mood, guilty feelings, low energy, poor concentration, poor appetite with a loss of a few pounds unintentionally in the last few weeks, psychomotor retardation, and vague thoughts of suicide. Pt reports that her suicide attempt was impulsive, and she had not been thinking about it or planning anything out. She denies current SI/HI/AH/VH. She reports her sleep is good when she uses cannabis, but otherwise she has  trouble getting to sleep. She denies symptoms of mania, OCD, and PTSD. She uses about 0.5-1.5 grams of cannabis daily, and she denies all other illicit substance use.  Discussed with patient about treatment options. She reports she had been taking bupropion, klonopin, and pristiq after her last discharge. Lamictal had been discontinued due to itchiness. Discussed with patient about starting combination of remeron and effexor to address her current symptoms of depression and anxiety, and pt was in agreement. She had no further questions, comments, or concerns.   PLAN OF CARE:   - Admit to inpatient level of care  -MDD, recurrent, severe             - Start Remeron 22m po qhs             - Start Effexor XR 37.558mpo qDay  - Anxiety             - start atarax 2577mo q8h prn anxiety  -Insomnia             - Start trazodone 33m53m qhs prn insomnia  -HLD             - restart lipitor 20mg15mqDay  -HLD             - restart HCTZ 25mg 72mDay  -Encourage participation in groups and therapeutic milieu -Discharge planning will be ongoing  ChristMaris Berger
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
## 2017-09-22 NOTE — Progress Notes (Signed)
D.  Pt pleasant but guarded on approach, complaint of headache voiced (see pain flow sheet).  Pt was positive for evening AA group, would benefit from groups on [address] hall.  Pt denies SI/HI/AVH at this time.  Pt is a new admission to the unit after an overdose of Lamictal (see admission note).  A.  Support and encouragement offered, medication given as ordered  R. Pt remains safe on the unit, will continue to monitor.

## 2017-09-22 NOTE — Progress Notes (Signed)
Recreation Therapy Notes  Date: 09/22/17 Time: 0930 Location: 300 Hall Dayroom  Group Topic: Stress Management  Goal Area(s) Addresses:  Patient will verbalize importance of using healthy stress management.  Patient will identify positive emotions associated with healthy stress management.   Behavioral Response: Engaged  Intervention: Stress Management  Activity :  LRT introduced the stress management technique of meditation.  LRT played a meditation to help patients deal with anxiety.  Patients were to follow along to fully engage in the meditation.  Education:  Stress Management, Discharge Planning.   Education Outcome: Acknowledges edcuation/In group clarification offered/Needs additional education  Clinical Observations/Feedback: Pt attended group.    Victorino Sparrow, LRT/CTRS         Ria Comment, Breyana Follansbee A 09/22/2017 11:04 AM

## 2017-09-22 NOTE — BHH Suicide Risk Assessment (Signed)
[facility] Admission Suicide Risk Assessment   Nursing information obtained from:    Demographic factors:    Current Mental Status:    Loss Factors:    Historical Factors:    Risk Reduction Factors:     Total Time spent with patient: 1 hour Principal Problem: MDD (major depressive disorder) Diagnosis:   Patient Active Problem List   Diagnosis Date Noted  . Cannabis use disorder, mild, abuse [F12.10] 09/22/2017  . MDD (major depressive disorder) [F32.9] 09/21/2017  . Hyperlipidemia [E78.5] 07/14/2017  . GERD (gastroesophageal reflux disease) [K21.9] 07/14/2017  . Hypokalemia [E87.6] 07/13/2017  . HTN (hypertension) [I10] 07/13/2017  . MDD (major depressive disorder), single episode, severe with psychosis ([place]) [F32.3] 07/11/2017   Subjective Data:  Alicia Robinson is a 42 y/o F with history of MDD and cannabis use disorder who was admitted voluntarily after suicide attempt overdose on 30-60 tablets of lamictal. Pt was medically cleared and transferred to [facility] for further treatment and stabilization.  Upon initial presentation, pt reports she has been struggling with depression and anxiety for several weeks. Pt has recent relevant history of discharge from [facility] on 07/18/17 after which time she reports continuing to struggle with depression. Pt shares details about the events prior to her suicide attempt, and she states, "I tried to commit suicide by taking too many pills. I had been fighting with my son and then I called my daughter and we fought, and I thought, 'I'll show them.'" Pt shares that she took about 30 tablets of lamictal with intention of killing herself, and she did not tell her family until she began to shake and appear ill, at which time she told them and she was brought in for evaluation.  Pt endorses depressive symptoms of depressed mood, guilty feelings, low energy, poor concentration, poor appetite with a loss of a few pounds unintentionally in the last few weeks, psychomotor  retardation, and vague thoughts of suicide. Pt reports that her suicide attempt was impulsive, and she had not been thinking about it or planning anything out. She denies current SI/HI/AH/VH. She reports her sleep is good when she uses cannabis, but otherwise she has trouble getting to sleep. She denies symptoms of mania, OCD, and PTSD. She uses about 0.5-1.5 grams of cannabis daily, and she denies all other illicit substance use.  Discussed with patient about treatment options. She reports she had been taking bupropion, klonopin, and pristiq after her last discharge. Lamictal had been discontinued due to itchiness. Discussed with patient about starting combination of remeron and effexor to address her current symptoms of depression and anxiety, and pt was in agreement. She had no further questions, comments, or concerns.    Continued Clinical Symptoms:    The "Alcohol Use Disorders Identification Test", Guidelines for Use in Primary Care, Second Edition.  World Pharmacologist [facility]). Score between 0-7:  no or low risk or alcohol related problems. Score between 8-15:  moderate risk of alcohol related problems. Score between 16-19:  high risk of alcohol related problems. Score 20 or above:  warrants further diagnostic evaluation for alcohol dependence and treatment.   CLINICAL FACTORS:   Severe Anxiety and/or Agitation Depression:   Comorbid alcohol abuse/dependence Alcohol/Substance Abuse/Dependencies More than one psychiatric diagnosis Currently Psychotic Previous Psychiatric Diagnoses and Treatments   Musculoskeletal: Strength & Muscle Tone: within normal limits Gait & Station: normal Patient leans: N/A  Psychiatric Specialty Exam: Physical Exam  Nursing note and vitals reviewed.   Review of Systems  Constitutional: Negative for  chills and fever.  Respiratory: Negative for cough.   Cardiovascular: Negative for chest pain.  Gastrointestinal: Negative for heartburn and nausea.   Neurological: Negative for dizziness.  Psychiatric/Behavioral: Positive for depression. Negative for hallucinations, substance abuse and suicidal ideas. The patient is nervous/anxious.     Blood pressure 117/87, pulse (!) 104, temperature 98.7 F (37.1 C), temperature source Oral, resp. rate 16, height 5\' 10"  (1.778 m), weight 84.8 kg (187 lb), SpO2 97 %.Body mass index is 26.83 kg/m.  General Appearance: Casual and Fairly Groomed  Eye Contact:  Good  Speech:  Clear and Coherent and Normal Rate  Volume:  Normal  Mood:  Anxious and Depressed  Affect:  Appropriate, Congruent and Constricted  Thought Process:  Coherent and Goal Directed  Orientation:  Full (Time, Place, and Person)  Thought Content:  Logical  Suicidal Thoughts:  No  Homicidal Thoughts:  No  Memory:  Immediate;   Fair Recent;   Fair Remote;   Fair  Judgement:  Fair  Insight:  Fair  Psychomotor Activity:  Normal  Concentration:  Concentration: Good  Recall:  [place] of Knowledge:  Good  Language:  Good  Akathisia:  No  Handed:    AIMS (if indicated):     Assets:  Communication Skills Physical Health Resilience Social Support  ADL's:  Intact  Cognition:  WNL  Sleep:  Number of Hours: 6.5      COGNITIVE FEATURES THAT CONTRIBUTE TO RISK:  None    SUICIDE RISK:   Minimal: No identifiable suicidal ideation.  Patients presenting with no risk factors but with morbid ruminations; may be classified as minimal risk based on the severity of the depressive symptoms  PLAN OF CARE:   - Admit to inpatient level of care  -MDD, recurrent, severe   - Start Remeron 15mg  po qhs  - Start Effexor XR 37.5mg  po qDay  - Anxiety   - start atarax 25mg  po q8h prn anxiety  -Insomnia  - Start trazodone 50mg  po qhs prn insomnia  -HLD  - restart lipitor 20mg  po qDay  -HLD  - restart HCTZ 25mg  po qDay  -Encourage participation in groups and therapeutic milieu -Discharge planning will be ongoing  I certify that  inpatient services furnished can reasonably be expected to improve the patient's condition.   Pennelope Bracken, MD 09/22/2017, 3:41 PM

## 2017-09-22 NOTE — Progress Notes (Signed)
Data. Patient denies SI/HI/AVH. Verbally contracts for safety on the unit and to come to staff before acting of any self harm thoughts/feelings.  Patient interacting well with staff and other patients. On her self assessment patient reports 4/10 for depression, 5/10 for hopelessness and 8/10 for anxiety. Her goal for today is, "Going home. Find out what I need to do. Develop a discharge plan" Patient reports "I tried to kill myself because of arguing with my children and I just felt hopeless." Patient did ask to be moved to 400 hall. "I think it would be more appropriate for me. I am scare of the people over here. I can't really focus on what I need to focus on."  Action. Emotional support and encouragement offered. Education provided on medication, indications and side effect. Q 15 minute checks done for safety. Response. Safety on the unit maintained through 15 minute checks.  Medications taken as prescribed. Attended groups. Remained calm and appropriate through out shift.

## 2017-09-22 NOTE — BHH Group Notes (Signed)
[facility] Mental Health Association Group Therapy 09/22/2017 1:15pm  Type of Therapy: Mental Health Association Presentation  Participation Level: Active  Participation Quality: Attentive  Affect: Appropriate  Cognitive: Oriented  Insight: Developing/Improving  Engagement in Therapy: Engaged  Modes of Intervention: Discussion, Education and Socialization  Summary of Progress/Problems: [place] ([place] Speaker came to talk about his personal journey with mental health. The pt processed ways by which to relate to the speaker. [place] speaker provided handouts and educational information pertaining to groups and services offered by the [facility]. Pt was engaged in speaker's presentation and was receptive to resources provided.    [employer], LCSW 09/22/2017 1:41 PM

## 2017-09-23 DIAGNOSIS — F1910 Other psychoactive substance abuse, uncomplicated: Secondary | ICD-10-CM

## 2017-09-23 MED ORDER — HYDROCHLOROTHIAZIDE 25 MG PO TABS
25.0000 mg | ORAL_TABLET | Freq: Every day | ORAL | Status: DC
  Administered 2017-09-23 – 2017-09-24 (×2): 25 mg via ORAL
  Filled 2017-09-23 (×5): qty 1

## 2017-09-23 NOTE — Progress Notes (Signed)
Pt attended NA group this evening.  

## 2017-09-23 NOTE — Progress Notes (Signed)
D: Patient observed up and visible in the dayroom. Observed coloring. Interacts with select peers. Patient states the hallway has been overwhelming at times as a few peers are disruptive per patient. Patient's affect anxious with congruent mood. Per self inventory and discussions with writer, rates depression at a 2/10, hopelessness at a 2/10 and anxiety at a 4/10. Rates sleep as poor, appetite as good, energy as low and concentration as poor. Denies pain, physical complaints.   A: Medicated per orders, prn vistaril given for anxiety. Level III obs in place for safety. Emotional support offered and self inventory reviewed. Encouraged completion of Suicide Safety Plan and programming participation. Discussed POC with MD, SW.   R: Patient verbalizes understanding of POC. On reassess, patient reports a decrease in anxiety. Patient denies SI/HI/AVH and remains safe on level III obs. Will continue to monitor closely and make verbal contact frequently.

## 2017-09-23 NOTE — BHH Group Notes (Signed)
Pt attended and participated in wrap up group and rated their day a 8 because they just had a good day overall. Pt was asked if they were a musician, what kind of music would they play and they responded, "slow music.

## 2017-09-23 NOTE — Progress Notes (Signed)
Patient ID: Alicia Robinson, female   DOB: May 30, 1976, 42 y.o.   MRN: [id number]   D: Patient pleasant on approach tonight and attending group tonight. Reports anxious on the unit and states she would feel more comfortable on [address]. Note left for treatment team and will give request to first shift nurse. Currently denies any active SI. Contracts on the unit. A: Staff will monitor on q 15 minute checks, follow treatment plan, and give meds as ordered. R: Cooperative on the unit. Remeron given tonight for sleep.

## 2017-09-23 NOTE — BHH Suicide Risk Assessment (Signed)
[place] INPATIENT:  Family/Significant Other Suicide Prevention Education  Suicide Prevention Education:  Education Completed; Alicia Robinson (pt's daughter) [phone] has been identified by the patient as the family member/significant other with whom the patient will be residing, and identified as the person(s) who will aid the patient in the event of a mental health crisis (suicidal ideations/suicide attempt).  With written consent from the patient, the family member/significant other has been provided the following suicide prevention education, prior to the and/or following the discharge of the patient.  The suicide prevention education provided includes the following:  Suicide risk factors  Suicide prevention and interventions  National Suicide Hotline telephone number  [facility] assessment telephone number  [facility] Emergency Assistance [place] and/or Residential Mobile Crisis Unit telephone number  Request made of family/significant other to:  Remove weapons (e.g., guns, rifles, knives), all items previously/currently identified as safety concern.    Remove drugs/medications (over-the-counter, prescriptions, illicit drugs), all items previously/currently identified as a safety concern.  The family member/significant other verbalizes understanding of the suicide prevention education information provided.  The family member/significant other agrees to remove the items of safety concern listed above.  SPE and aftercare reviewed with pt's daughter. No safety concerns reported. Family resources also provided (Friends and Family Group through [facility]).   Alicia Robinson N Smart LCSW 09/23/2017, 12:32 PM

## 2017-09-23 NOTE — Plan of Care (Signed)
Patient has been med compliant.   Patient has not engaged in self harm, denies thoughts to do so.

## 2017-09-23 NOTE — BHH Group Notes (Signed)
LCSW Group Therapy Note  09/23/2017 1:15pm  Type of Therapy/Topic:  Group Therapy:  Feelings about Diagnosis  Participation Level:  Active   Description of Group:   This group will allow patients to explore their thoughts and feelings about diagnoses they have received. Patients will be guided to explore their level of understanding and acceptance of these diagnoses. Facilitator will encourage patients to process their thoughts and feelings about the reactions of others to their diagnosis and will guide patients in identifying ways to discuss their diagnosis with significant others in their lives. This group will be process-oriented, with patients participating in exploration of their own experiences, giving and receiving support, and processing challenge from other group members.   Therapeutic Goals: 1. Patient will demonstrate understanding of diagnosis as evidenced by identifying two or more symptoms of the disorder 2. Patient will be able to express two feelings regarding the diagnosis 3. Patient will demonstrate their ability to communicate their needs through discussion and/or role play  Summary of Patient Progress:  Alicia Robinson was attentive and engaged during today's processing group. She shared that she felt good to know her diagnosis. "It explains a lot of my behavior and now I know that I need to go to groups and take my medications or else I won't get any better." Alicia Robinson explored things she could do in her day to day routine to avoid marijuana/substance use and rather, utilize her coping skills. She continues to demonstrate progress in the group setting with improving insight.    Therapeutic Modalities:   Cognitive Behavioral Therapy Brief Therapy Feelings Identification    Kimber Relic Smart, LCSW 09/23/2017 3:12 PM

## 2017-09-23 NOTE — Progress Notes (Signed)
Recreation Therapy Notes  Animal-Assisted Activity (AAA) Program Checklist/Progress Notes Patient Eligibility Criteria Checklist & Daily Group note for Rec TxIntervention  Date: 01.18.2018 Time: 2:45pm Location: [address]   AAA/T Program Assumption of Risk Form signed by Patient/ or Parent Legal Guardian Yes  Patient is free of allergies or sever asthma  Yes  Patient reports no fear of animals Yes  Patient reports no history of cruelty to animals Yes   Patient understands his/her participation is voluntary Yes  Patient washes hands before animal contact Yes  Patient washes hands after animal contact Yes  Behavioral Response: Did not attend.   Alicia Robinson Alicia Robinson, LRT/CTRS       Alicia Robinson 09/23/2017 2:57 PM

## 2017-09-23 NOTE — Progress Notes (Signed)
[facility] MD Progress Note  09/23/2017 1:49 PM Alicia Robinson  MRN:  [id number]  Subjective: Alicia Robinson reports, "I'm feeling a lot better today".  Objective: Alicia Robinson is a 42 y/o F with history of MDD and cannabis use disorder who was admitted voluntarily after suicide attempt overdose on 30-60 tablets of lamictal. Pt was medically cleared and transferred to [facility] for further treatment and stabilization. Today, 09-23-17, Alicia Robinson is seen, chart reviewed. Chart findings discussed with the treatment team. She is alert, oriented x 4. She is visible on the unit, attending & participating in the group sessions. She reports that she is feeling a lot better toda. Says, she came back to the hospital soon after being discharged few months ago because she became very stressed & distressed after she lost 2 jobs back to back. Alicia Robinson says, her mind got blocked, started to go crazy & played tricks on her. She says she realized now that what a stupid act that was trying to kill herself by overdose. She says her children are feeling sad about what did. She says she is looking forward to doing better from here on. Will be moving in with son after discharge with her 64 year old son. She currently denies any SIHI, AVH, delusional thoughts or paranoia.   Principal Problem: MDD (major depressive disorder)  Diagnosis:   Patient Active Problem List   Diagnosis Date Noted  . Cannabis use disorder, mild, abuse [F12.10] 09/22/2017  . MDD (major depressive disorder) [F32.9] 09/21/2017  . Hyperlipidemia [E78.5] 07/14/2017  . GERD (gastroesophageal reflux disease) [K21.9] 07/14/2017  . Hypokalemia [E87.6] 07/13/2017  . HTN (hypertension) [I10] 07/13/2017  . MDD (major depressive disorder), single episode, severe with psychosis ([place]) [F32.3] 07/11/2017   Total Time spent with patient: 25 minutes  Past Psychiatric History: See H&P.  Past Medical History:  Past Medical History:  Diagnosis Date  . Depression   . Hypertension   .  Suicidal overdose [facility])     Past Surgical History:  Procedure Laterality Date  . CHOLECYSTECTOMY    . TUBAL LIGATION     Family History: History reviewed. No pertinent family history.  Family Psychiatric  History: See H&P.  Social History:  Social History   Substance and Sexual Activity  Alcohol Use Yes   Comment: weekends     Social History   Substance and Sexual Activity  Drug Use Yes  . Types: Marijuana    Social History   Socioeconomic History  . Marital status: Single    Spouse name: None  . Number of children: None  . Years of education: None  . Highest education level: None  Social Needs  . Financial resource strain: None  . Food insecurity - worry: None  . Food insecurity - inability: None  . Transportation needs - medical: None  . Transportation needs - non-medical: None  Occupational History  . None  Tobacco Use  . Smoking status: Never Smoker  . Smokeless tobacco: Never Used  Substance and Sexual Activity  . Alcohol use: Yes    Comment: weekends  . Drug use: Yes    Types: Marijuana  . Sexual activity: Yes  Other Topics Concern  . None  Social History Narrative  . None   Additional Social History:   Sleep: Good  Appetite:  Good  Current Medications: Current Facility-Administered Medications  Medication Dose Route Frequency Provider Last Rate Last Dose  . acetaminophen (TYLENOL) tablet 650 mg  650 mg Oral Q6H PRN Derrill Center, NP  650 mg at 09/22/17 0801  . alum & mag hydroxide-simeth (MAALOX/MYLANTA) 200-200-20 MG/5ML suspension 30 mL  30 mL Oral Q4H PRN Derrill Center, NP      . aspirin chewable tablet 81 mg  81 mg Oral Daily Derrill Center, NP   81 mg at 09/23/17 0812  . atorvastatin (LIPITOR) tablet 20 mg  20 mg Oral q1800 Derrill Center, NP   20 mg at 09/22/17 1710  . fluconazole (DIFLUCAN) tablet 150 mg  150 mg Oral Daily Derrill Center, NP   150 mg at 09/23/17 3893  . hydrochlorothiazide (HYDRODIURIL) tablet 25 mg  25 mg Oral  Daily Pennelope Bracken, MD   25 mg at 09/23/17 0959  . hydrOXYzine (ATARAX/VISTARIL) tablet 25 mg  25 mg Oral TID PRN Derrill Center, NP   25 mg at 09/23/17 0815  . magnesium hydroxide (MILK OF MAGNESIA) suspension 30 mL  30 mL Oral Daily PRN Derrill Center, NP      . mirtazapine (REMERON) tablet 15 mg  15 mg Oral QHS Derrill Center, NP   15 mg at 09/22/17 2125  . venlafaxine XR (EFFEXOR-XR) 24 hr capsule 37.5 mg  37.5 mg Oral Q breakfast Derrill Center, NP   37.5 mg at 09/23/17 7342    Lab Results:  Results for orders placed or performed during the hospital encounter of 09/21/17 (from the past 48 hour(s))  TSH     Status: None   Collection Time: 09/22/17  6:40 AM  Result Value Ref Range   TSH 1.585 0.350 - 4.500 uIU/mL    Comment: Performed by a 3rd Generation assay with a functional sensitivity of <=0.01 uIU/mL. Performed at [facility], [place] [address], [place], [place] [address]   Hemoglobin A1c     Status: None   Collection Time: 09/22/17  6:40 AM  Result Value Ref Range   Hgb A1c MFr Bld 5.3 4.8 - 5.6 %    Comment: (NOTE) Pre diabetes:          5.7%-6.4% Diabetes:              >6.4% Glycemic control for   <7.0% adults with diabetes    Mean Plasma Glucose 105.41 mg/dL    Comment: Performed at [place] [address] [place],  [address]  Lipid panel     Status: Abnormal   Collection Time: 09/22/17  6:40 AM  Result Value Ref Range   Cholesterol 268 (H) 0 - 200 mg/dL   Triglycerides 106 <150 mg/dL   HDL 58 >40 mg/dL   Total CHOL/HDL Ratio 4.6 RATIO   VLDL 21 0 - 40 mg/dL   LDL Cholesterol 189 (H) 0 - 99 mg/dL    Comment:        Total Cholesterol/HDL:CHD Risk Coronary Heart Disease Risk Table                     Men   Women  1/2 Average Risk   3.4   3.3  Average Risk       5.0   4.4  2 X Average Risk   9.6   7.1  3 X Average Risk  23.4   11.0        Use the calculated Patient Ratio above and the CHD Risk Table to  determine the patient's CHD Risk.        ATP III CLASSIFICATION (LDL):  <100  mg/dL   Optimal  100-129  mg/dL   Near or Above                    Optimal  130-159  mg/dL   Borderline  160-189  mg/dL   High  >190     mg/dL   Very High Performed at [place] [address], [place], [place] [address]    Blood Alcohol level:  Lab Results  Component Value Date   ETH <10 09/20/2017   ETH <10 23/53/6144   Metabolic Disorder Labs: Lab Results  Component Value Date   HGBA1C 5.3 09/22/2017   MPG 105.41 09/22/2017   MPG 108.28 07/11/2017   No results found for: PROLACTIN Lab Results  Component Value Date   CHOL 268 (H) 09/22/2017   TRIG 106 09/22/2017   HDL 58 09/22/2017   CHOLHDL 4.6 09/22/2017   VLDL 21 09/22/2017   LDLCALC 189 (H) 09/22/2017   LDLCALC 99 07/11/2017   Physical Findings: AIMS: Facial and Oral Movements Muscles of Facial Expression: None, normal Lips and Perioral Area: None, normal Jaw: None, normal Tongue: None, normal,Extremity Movements Upper (arms, wrists, hands, fingers): None, normal Lower (legs, knees, ankles, toes): None, normal, Trunk Movements Neck, shoulders, hips: None, normal, Overall Severity Severity of abnormal movements (highest score from questions above): None, normal Incapacitation due to abnormal movements: None, normal Patient's awareness of abnormal movements (rate only patient's report): No Awareness, Dental Status Current problems with teeth and/or dentures?: No Does patient usually wear dentures?: No  CIWA:    COWS:     Musculoskeletal: Strength & Muscle Tone: within normal limits Gait & Station: normal Patient leans: N/A  Psychiatric Specialty Exam: Physical Exam  Nursing note and vitals reviewed.   Review of Systems  Psychiatric/Behavioral: Positive for depression and substance abuse (Hx. THC use). Negative for hallucinations, memory loss and suicidal ideas. The patient has insomnia  ("Improving"). The patient is not nervous/anxious.     Blood pressure 119/89, pulse 99, temperature 98.3 F (36.8 C), temperature source Oral, resp. rate 16, height 5\' 10"  (1.778 m), weight 84.8 kg (187 lb), SpO2 97 %.Body mass index is 26.83 kg/m.  See Md's SRA   Treatment Plan Summary: Daily contact with patient to assess and evaluate symptoms and progress in treatment:  Continue inpatient hospitalization.  Will continue today 09/23/2017 plan as below except where it is noted.  Depression.       - Continue Effexor XR 37.5 mg po daily.  Depression/insomnia.       - Continue Mirtazapine 15 mg po Q hs.  Anxiety.       - Continue Vistaril 25 mg tid prn.  Other medical issues.       - Continue HCTZ 25 mg pr daily.       - Continue Lipitor 20 mg po at 1800 PM.       - Continue Diflucan 150 mg po daily.         - Patient will continue to attend & participate in the group milieu.        - SW to continue to work on the discharge disposition.  Lindell Spar, NP, PMHNP, FNP-BC 09/23/2017, 1:49 PM

## 2017-09-24 MED ORDER — FLUCONAZOLE 150 MG PO TABS: 150.0000 mg | ORAL_TABLET | Freq: Every day | ORAL | 0 refills | Status: DC

## 2017-09-24 MED ORDER — MIRTAZAPINE 15 MG PO TABS: 15.0000 mg | ORAL_TABLET | Freq: Every day | ORAL | 0 refills | Status: DC

## 2017-09-24 MED ORDER — VENLAFAXINE HCL ER 37.5 MG PO CP24: 37.5000 mg | ORAL_CAPSULE | Freq: Every day | ORAL | 0 refills | Status: DC

## 2017-09-24 MED ORDER — HYDROCHLOROTHIAZIDE 25 MG PO TABS: 25.0000 mg | ORAL_TABLET | Freq: Every day | ORAL | 0 refills | Status: DC

## 2017-09-24 MED ORDER — ATORVASTATIN CALCIUM 20 MG PO TABS: 20.0000 mg | ORAL_TABLET | Freq: Every day | ORAL | Status: DC

## 2017-09-24 MED ORDER — ASPIRIN EC 81 MG PO TBEC
81.0000 mg | DELAYED_RELEASE_TABLET | Freq: Every day | ORAL | Status: DC
Start: 2017-09-24 — End: 2021-08-15

## 2017-09-24 MED ORDER — HYDROXYZINE HCL 25 MG PO TABS: 25.0000 mg | ORAL_TABLET | Freq: Three times a day (TID) | ORAL | 0 refills | Status: DC | PRN

## 2017-09-24 NOTE — Progress Notes (Signed)
Recreation Therapy Notes  Date: 09/24/17 Time: 0930 Location: 300 Hall Dayroom  Group Topic: Stress Management  Goal Area(s) Addresses:  Patient will verbalize importance of using healthy stress management.  Patient will identify positive emotions associated with healthy stress management.   Behavioral Response: Engaged  Intervention: Stress Management  Activity : Guided Imagery.  LRT introduced the stress management technique of guided imagery.  LRT read a script that allowed patients to visualize themselves along the beach.  Patients were to listen and follow along as script was read to engage in the activity.  Education:  Stress Management, Discharge Planning.   Education Outcome: Acknowledges edcuation/In group clarification offered/Needs additional education  Clinical Observations/Feedback: Pt attended group.   Victorino Sparrow, LRT/CTRS         Ria Comment, Tashia Leiterman A 09/24/2017 11:00 AM

## 2017-09-24 NOTE — Discharge Summary (Signed)
Physician Discharge Summary Note  Patient:  Alicia Robinson is an 42 y.o., female  MRN:  [id number]  DOB:  03-25-1976  Patient phone:  [phone] (home)   Patient address:   [place] [address],   Total Time spent with patient: Greater than 30 minutes  Date of Admission:  09/21/2017  Date of Discharge: 09/24/2017  Reason for Admission:   Principal Problem: MDD (major depressive disorder)  Discharge Diagnoses: Patient Active Problem List   Diagnosis Date Noted  . Cannabis use disorder, mild, abuse [F12.10] 09/22/2017  . MDD (major depressive disorder) [F32.9] 09/21/2017  . Hyperlipidemia [E78.5] 07/14/2017  . GERD (gastroesophageal reflux disease) [K21.9] 07/14/2017  . Hypokalemia [E87.6] 07/13/2017  . HTN (hypertension) [I10] 07/13/2017  . MDD (major depressive disorder), single episode, severe with psychosis ([place]) [F32.3] 07/11/2017   Past Psychiatric History: Major depression. Cannabis use disorder.  Past Medical History:  Past Medical History:  Diagnosis Date  . Depression   . Hypertension   . Suicidal overdose [facility])     Past Surgical History:  Procedure Laterality Date  . CHOLECYSTECTOMY    . TUBAL LIGATION     Family History: History reviewed. No pertinent family history.  Family Psychiatric  History: See H&P  Social History:  Social History   Substance and Sexual Activity  Alcohol Use Yes   Comment: weekends     Social History   Substance and Sexual Activity  Drug Use Yes  . Types: Marijuana    Social History   Socioeconomic History  . Marital status: Single    Spouse name: None  . Number of children: None  . Years of education: None  . Highest education level: None  Social Needs  . Financial resource strain: None  . Food insecurity - worry: None  . Food insecurity - inability: None  . Transportation needs - medical: None  . Transportation needs - non-medical: None  Occupational History  . None  Tobacco Use  .  Smoking status: Never Smoker  . Smokeless tobacco: Never Used  Substance and Sexual Activity  . Alcohol use: Yes    Comment: weekends  . Drug use: Yes    Types: Marijuana  . Sexual activity: Yes  Other Topics Concern  . None  Social History Narrative  . None   Hospital Course: Alicia Robinson is a 42 y/o F with history of MDD and cannabis use disorder who was admitted voluntarily after suicide attempt overdose on 30-60 tablets of Lamictal. Pt was medically cleared and transferred to [facility] for further treatment and stabilization. Upon initial presentation, pt reports she has been struggling with depression and anxiety for several weeks. Pt has recent relevant history of discharge from [facility] on 07/18/17 after which time she reports continuing to struggle with depression. Pt shares details about the events prior to her suicide attempt, and she states, "I tried to commit suicide by taking too many pills. I had been fighting with my son and then I called my daughter and we fought, and I thought, 'I'll show them.'" Pt shares that she took about 30 tablets of lamictal with intention of killing herself, and she did not tell her family until she began to shake and appear ill, at which time she told them and she was brought in for evaluation.  Alicia Robinson was admitted to the hospital for worsening symptoms of depression & crisis management due to suicide attempt by overdose on Lamictal. She cited familial stressors as the trigger. She  was recently discharged from this hospital last November, 2018 after mood stabilization treatments due to worsening depression. She has hx of Cannabis use disorder.  After her present admission assessment, Alicia Robinson's presenting symptoms were identified. The medication regimen targeting those symptoms were discussed & initiated. She was medicated & discharged on; Hydroxyzine 25 mg prn for anxiety, Mirtazapine 15 mg for depression/insomnia & Effexor XR 37.5 mg for depression. Her other pre-existing  medical problems were identified & her pertinent home medications for those health issues were resumed. She was enrolled & participated in the group counseling sessions being offered & held on this unit. She learned coping skills..  During the course of her hospitalization, Alicia Robinson's improvement was monitored by observation & her daily report of symptom reduction noted. Her emotional & mental status were monitored by daily self-inventory reports completed by her & the clinical staff. She was evaluated daily by the treatment team for mood stability & plans for continued recovery after discharge. She was offered further treatment options upon discharge on an outpatient basis as noted below. She was provided with all the necessary information needed to make this appointment without problems.   Upon discharge, Alicia Robinson was both mentally and medically stable. She denies suicidal/homicidal ideations, auditory/visual/tactile hallucinations, delusional thoughts or paranoia. She left [facility] with all personal belongings in no apparent distress. Transportation family (son).  Physical Findings: AIMS: Facial and Oral Movements Muscles of Facial Expression: None, normal Lips and Perioral Area: None, normal Jaw: None, normal Tongue: None, normal,Extremity Movements Upper (arms, wrists, hands, fingers): None, normal Lower (legs, knees, ankles, toes): None, normal, Trunk Movements Neck, shoulders, hips: None, normal, Overall Severity Severity of abnormal movements (highest score from questions above): None, normal Incapacitation due to abnormal movements: None, normal Patient's awareness of abnormal movements (rate only patient's report): No Awareness, Dental Status Current problems with teeth and/or dentures?: No Does patient usually wear dentures?: No  CIWA:    COWS:     Musculoskeletal: Strength & Muscle Tone: within normal limits Gait & Station: normal Patient leans: N/A  Psychiatric Specialty Exam:   Physical Exam  Vitals reviewed. Constitutional: She appears well-developed.  HENT:  Head: Normocephalic.  Eyes: Pupils are equal, round, and reactive to light.  Neck: Normal range of motion.  Cardiovascular: Normal rate.  Respiratory: Effort normal.  GI: Soft.  Genitourinary:  Genitourinary Comments: Deferred  Musculoskeletal: Normal range of motion.  Neurological: She is alert.  Psychiatric: She has a normal mood and affect.    Review of Systems  Constitutional: Negative.   HENT: Negative.   Eyes: Negative.   Respiratory: Negative.   Cardiovascular: Negative.   Gastrointestinal: Negative.   Genitourinary: Negative.   Musculoskeletal: Negative.   Skin: Negative.   Neurological: Negative.   Endo/Heme/Allergies: Negative.   Psychiatric/Behavioral: Positive for depression (Stable ) and substance abuse (Hx. THC use). Negative for hallucinations, memory loss and suicidal ideas. The patient has insomnia (Stable). The patient is not nervous/anxious.     Blood pressure 112/83, pulse 97, temperature 98.2 F (36.8 C), temperature source Oral, resp. rate 16, height 5\' 10"  (1.778 m), weight 84.8 kg (187 lb), SpO2 97 %.Body mass index is 26.83 kg/m.  See Md's SRA.  Have you used any form of tobacco in the last 30 days? (Cigarettes, Smokeless Tobacco, Cigars, and/or Pipes): No  Has this patient used any form of tobacco in the last 30 days? (Cigarettes, Smokeless Tobacco, Cigars, and/or Pipes)  No  Blood Alcohol level:  Lab Results  Component Value Date  ETH <10 09/20/2017   ETH <10 78/46/9629   Metabolic Disorder Labs:  Lab Results  Component Value Date   HGBA1C 5.3 09/22/2017   MPG 105.41 09/22/2017   MPG 108.28 07/11/2017   No results found for: PROLACTIN Lab Results  Component Value Date   CHOL 268 (H) 09/22/2017   TRIG 106 09/22/2017   HDL 58 09/22/2017   CHOLHDL 4.6 09/22/2017   VLDL 21 09/22/2017   LDLCALC 189 (H) 09/22/2017   [place] 99 07/11/2017   See  Psychiatric Specialty Exam and Suicide Risk Assessment completed by Attending Physician prior to discharge.  Discharge destination:  Home  Is patient on multiple antipsychotic therapies at discharge:  No   Has Patient had three or more failed trials of antipsychotic monotherapy by history:  No  Recommended Plan for Multiple Antipsychotic Therapies: NA  Allergies as of 09/24/2017   No Known Allergies     Medication List    STOP taking these medications   clonazePAM 0.5 MG tablet Commonly known as:  KLONOPIN   lamoTRIgine 25 MG tablet Commonly known as:  LAMICTAL   omeprazole 20 MG capsule Commonly known as:  PRILOSEC   PARoxetine 10 MG tablet Commonly known as:  PAXIL   traZODone 50 MG tablet Commonly known as:  DESYREL     TAKE these medications     Indication  aspirin EC 81 MG tablet Take 1 tablet (81 mg total) by mouth daily. For heart health What changed:  additional instructions  Indication:  Heart health   atorvastatin 20 MG tablet Commonly known as:  LIPITOR Take 1 tablet (20 mg total) by mouth daily. For high cholesterol What changed:  additional instructions  Indication:  Inherited Heterozygous Hypercholesterolemia, High Amount of Fats in the Blood   fluconazole 150 MG tablet Commonly known as:  DIFLUCAN Take 1 tablet (150 mg total) by mouth daily. (For 3 more days): For yeast infection Start taking on:  09/25/2017  Indication:  Urinary Tract Infection, Vagina and Vulva Infection due to Candida Species Fungus   hydrochlorothiazide 25 MG tablet Commonly known as:  HYDRODIURIL Take 1 tablet (25 mg total) by mouth daily. For high blood pressure What changed:  additional instructions  Indication:  High Blood Pressure Disorder, hypertension   hydrOXYzine 25 MG tablet Commonly known as:  ATARAX/VISTARIL Take 1 tablet (25 mg total) by mouth 3 (three) times daily as needed for anxiety.  Indication:  Feeling Anxious   mirtazapine 15 MG tablet Commonly known  as:  REMERON Take 1 tablet (15 mg total) by mouth at bedtime. For depression/insomnia What changed:    medication strength  how much to take  additional instructions  Indication:  Major Depressive Disorder, [place]   venlafaxine XR 37.5 MG 24 hr capsule Commonly known as:  EFFEXOR-XR Take 1 capsule (37.5 mg total) by mouth daily with breakfast. For depression Start taking on:  09/25/2017 What changed:    medication strength  how much to take  additional instructions  Another medication with the same name was removed. Continue taking this medication, and follow the directions you see here.  Indication:  Major Depressive Disorder      Follow-up Information    [facility] Follow up on 09/30/2017.   Specialty:  Behavioral Health Why:  Hospital follow-up on Tuesday, 1/15 at 8:15AM. Please bring photo ID, insurance card, and hospital follow-up paperwork with you to this appt.  Contact information: [place] [address] [phone]  Follow-up recommendations: Activity:  As tolerated Diet: As recommended by your primary care doctor. Keep all scheduled follow-up appointments as recommended.   Comments: Patient is instructed prior to discharge to: Take all medications as prescribed by his/her mental healthcare provider. Report any adverse effects and or reactions from the medicines to his/her outpatient provider promptly. Patient has been instructed & cautioned: To not engage in alcohol and or illegal drug use while on prescription medicines. In the event of worsening symptoms, patient is instructed to call the crisis hotline, 911 and or go to the nearest ED for appropriate evaluation and treatment of symptoms. To follow-up with his/her primary care provider for your other medical issues, concerns and or health care needs.   Signed: Lindell Spar, NP, PMHNP, FNP-BC 09/24/2017, 11:11 AM    Patient seen, Suicide Assessment Completed.  Disposition Plan  Reviewed   Iola Turri is a 42 y/o F with history of MDD and cannabis use disorder who was admitted voluntarily after suicide attempt overdose on 30-60 tablets of lamictal. Pt was medically cleared and transferred to [facility] for further treatment and stabilization. Upon initial presentation, pt was restarted on combination of effexor and remeron, and she had improvement of her mood and sleep symptoms.  Today upon evaluation, pt reports she is doing well overall. She denies SI/HI/AH/VH. She is sleeping well. She reports that her medications are helpful and she would like to continue taking them as currently ordered. She specifically mentions combination of vistaril and remeron were helpful for sleep. She has plans to attend a Women's Group that she found out about through the mental health association with first meeting being later today. She is able to engage in safety planning including plan to return to [facility] or contact emergency services if she feels unable to maintain her own safety. Pt had no further questions, comments, or concerns.  Plan Of Care/Follow-up recommendations:   - Discharge to outpatient level of service  Depression. - Continue Effexor XR 37.5 mg po daily.  Depression/insomnia. - Continue Mirtazapine 15 mg po Q hs.  Anxiety. - Continue Vistaril 25 mg tid prn.  Other medical issues. - Continue HCTZ 25 mg pr daily. - Continue Lipitor 20 mg po at 1800 PM. - Continue Diflucan 150 mg po daily.   Activity:  as tolerated Diet:  normal Tests:  NA Other:  see above for DC plan  Pennelope Bracken, MD

## 2017-09-24 NOTE — Progress Notes (Signed)
D: Pt denies SI/HI/AV hallucinations. Pt is pleasant and cooperative. Pt goal for today is to prepare for discharge. A: Pt was offered support and encouragement. Pt was given scheduled medications. Pt was encourage to attend groups. Q 15 minute checks were done for safety.  R:Pt attends groups and interacts well with peers and staff. Pt is taking medication. Pt has no complaints.Pt receptive to treatment and safety maintained on unit.

## 2017-09-24 NOTE — Progress Notes (Signed)
Patient verbalizes readiness for discharge. Follow up plan explained, AVS, transition record and SRA given along with prescriptions.  All belongings returned. Suicide Safety Plan completed, on chart and copy given to patient. Patient verbalizes understanding. Denies SI/HI and assures this Probation officer she will seek assistance should that change. Patient discharged ambulatory and in stable condition to daughter.

## 2017-09-24 NOTE — BHH Suicide Risk Assessment (Signed)
[facility] Discharge Suicide Risk Assessment   Principal Problem: MDD (major depressive disorder) Discharge Diagnoses:  Patient Active Problem List   Diagnosis Date Noted  . Cannabis use disorder, mild, abuse [F12.10] 09/22/2017  . MDD (major depressive disorder) [F32.9] 09/21/2017  . Hyperlipidemia [E78.5] 07/14/2017  . GERD (gastroesophageal reflux disease) [K21.9] 07/14/2017  . Hypokalemia [E87.6] 07/13/2017  . HTN (hypertension) [I10] 07/13/2017  . MDD (major depressive disorder), single episode, severe with psychosis ([place]) [F32.3] 07/11/2017    Total Time spent with patient: 30 minutes  Musculoskeletal: Strength & Muscle Tone: within normal limits Gait & Station: normal Patient leans: N/A  Psychiatric Specialty Exam: Review of Systems  Constitutional: Negative for chills and fever.  Cardiovascular: Negative for chest pain.  Gastrointestinal: Negative for heartburn and nausea.  Psychiatric/Behavioral: Negative for depression, hallucinations and suicidal ideas.    Blood pressure 112/83, pulse 97, temperature 98.2 F (36.8 C), temperature source Oral, resp. rate 16, height 5\' 10"  (1.778 m), weight 84.8 kg (187 lb), SpO2 97 %.Body mass index is 26.83 kg/m.  General Appearance: Casual and Fairly Groomed  Engineer, water::  Good  Speech:  Clear and Coherent and Normal Rate  Volume:  Normal  Mood:  Euthymic  Affect:  Appropriate and Congruent  Thought Process:  Coherent and Goal Directed  Orientation:  Full (Time, Place, and Person)  Thought Content:  Logical  Suicidal Thoughts:  No  Homicidal Thoughts:  No  Memory:  Immediate;   Fair Recent;   Fair Remote;   Fair  Judgement:  Fair  Insight:  Fair  Psychomotor Activity:  Normal  Concentration:  Fair  Recall:  Good  [employer] [place]  Language: Fair  Akathisia:  No  Handed:    AIMS (if indicated):     Assets:  Armed forces logistics/support/administrative officer Physical Health Resilience Social Support  Sleep:  Number of Hours: 6.75  Cognition:  WNL  ADL's:  Intact   Mental Status Per Nursing Assessment::   On Admission:     Demographic Factors:  NA  Loss Factors: NA  Historical Factors: Impulsivity  Risk Reduction Factors:   Positive social support, Positive therapeutic relationship and Positive coping skills or problem solving skills  Continued Clinical Symptoms:  Depression:   Comorbid alcohol abuse/dependence Alcohol/Substance Abuse/Dependencies  Cognitive Features That Contribute To Risk:  None    Suicide Risk:  Minimal: No identifiable suicidal ideation.  Patients presenting with no risk factors but with morbid ruminations; may be classified as minimal risk based on the severity of the depressive symptoms  Follow-up Information    [facility] Follow up on 09/30/2017.   Specialty:  Behavioral Health Why:  Hospital follow-up on Tuesday, 1/15 at 8:15AM. Please bring photo ID, insurance card, and hospital follow-up paperwork with you to this appt.  Contact information: [place] [address] [phone]         Subjective Data:  Alicia Robinson is a 42 y/o F with history of MDD and cannabis use disorder who was admitted voluntarily after suicide attempt overdose on 30-60 tablets of lamictal. Pt was medically cleared and transferred to [facility] for further treatment and stabilization. Upon initial presentation, pt was restarted on combination of effexor and remeron, and she had improvement of her mood and sleep symptoms.  Today upon evaluation, pt reports she is doing well overall. She denies SI/HI/AH/VH. She is sleeping well. She reports that her medications are helpful and she would like to continue taking them as currently ordered. She specifically mentions combination of  vistaril and remeron were helpful for sleep. She has plans to attend a Women's Group that she found out about through the mental health association with first meeting being later today. She is able to engage in safety planning including  plan to return to [facility] or contact emergency services if she feels unable to maintain her own safety. Pt had no further questions, comments, or concerns.  Plan Of Care/Follow-up recommendations:   - Discharge to outpatient level of service  Depression.       - Continue Effexor XR 37.5 mg po daily.  Depression/insomnia.       - Continue Mirtazapine 15 mg po Q hs.  Anxiety.       - Continue Vistaril 25 mg tid prn.  Other medical issues.       - Continue HCTZ 25 mg pr daily.       - Continue Lipitor 20 mg po at 1800 PM.       - Continue Diflucan 150 mg po daily.   Activity:  as tolerated Diet:  normal Tests:  NA Other:  see above for DC plan  Pennelope Bracken, MD 09/24/2017, 10:07 AM

## 2017-09-24 NOTE — Progress Notes (Signed)
D: Patient observed in room. Did come up for AM meds when requested. Patient states she is leaving today and will be picked up by her daughter. States she feels prepared for discharge. Feels safe. Patient's affect slightly anxious but appropriate. Mood pleasant. Per self inventory and discussions with writer, rates depression at a 2/10, hopelessness at a 2/10 and anxiety at a 5/10. Rates sleep as good, appetite as good, energy as normal and concentration as good.  States goal for today is to "get ready to go home, call and sign up for groups at [facility]. There's a 12:30 group I hope to make."  Denies pain, physical complaints.   A: Medicated per orders, no prns requested or required. Level III obs in place for safety. Emotional support offered and self inventory reviewed. Encouraged completion of Suicide Safety Plan and programming participation. Discussed POC with MD, SW.     R: Patient verbalizes understanding of POC. Patient denies SI/HI/AVH and remains safe on level III obs. Will continue to monitor closely and make verbal contact frequently.

## 2017-09-24 NOTE — Progress Notes (Signed)
  [facility] Adult Case Management Discharge Plan :  Will you be returning to the same living situation after discharge:  Yes,  home At discharge, do you have transportation home?: Yes,  son/daughter Do you have the ability to pay for your medications: Yes, [facility] Medicaid   Release of information consent forms completed and submitted to medical records by CSW.  Patient to Follow up at: Follow-up Information    [facility] Follow up on 09/30/2017.   Specialty:  Behavioral Health Why:  Hospital follow-up on Tuesday, 1/15 at 8:15AM. Please bring photo ID, insurance card, and hospital follow-up paperwork with you to this appt.  Contact information: [place] [address] [phone]           Next level of care provider has access to [place] and Suicide Prevention discussed: Yes,  SPE completed with both pt and her daughter. SPI pamphlet and Mobile Crisis information provided to pt.   Have you used any form of tobacco in the last 30 days? (Cigarettes, Smokeless Tobacco, Cigars, and/or Pipes): No  Has patient been referred to the Quitline?: N/A patient is not a smoker  Patient has been referred for addiction treatment: Yes  [employer], LCSW 09/24/2017, 8:46 AM

## 2017-11-19 ENCOUNTER — Other Ambulatory Visit (HOSPITAL_COMMUNITY): Payer: Self-pay | Admitting: Family

## 2018-04-16 DIAGNOSIS — Z8673 Personal history of transient ischemic attack (TIA), and cerebral infarction without residual deficits: Secondary | ICD-10-CM | POA: Insufficient documentation

## 2018-11-18 ENCOUNTER — Encounter (HOSPITAL_COMMUNITY): Payer: Self-pay | Admitting: Emergency Medicine

## 2018-11-18 ENCOUNTER — Emergency Department (HOSPITAL_COMMUNITY): Payer: BLUE CROSS/BLUE SHIELD

## 2018-11-18 ENCOUNTER — Emergency Department (HOSPITAL_COMMUNITY)
Admission: EM | Admit: 2018-11-18 | Discharge: 2018-11-19 | Disposition: A | Discharge status: Other Acute Inpatient Hospital | Payer: BLUE CROSS/BLUE SHIELD | Attending: Emergency Medicine | Admitting: Emergency Medicine

## 2018-11-18 DIAGNOSIS — I10 Essential (primary) hypertension: Secondary | ICD-10-CM | POA: Diagnosis not present

## 2018-11-18 DIAGNOSIS — F333 Major depressive disorder, recurrent, severe with psychotic symptoms: Secondary | ICD-10-CM | POA: Diagnosis not present

## 2018-11-18 DIAGNOSIS — F419 Anxiety disorder, unspecified: Secondary | ICD-10-CM | POA: Diagnosis not present

## 2018-11-18 DIAGNOSIS — Z7982 Long term (current) use of aspirin: Secondary | ICD-10-CM | POA: Diagnosis not present

## 2018-11-18 DIAGNOSIS — F32A Depression, unspecified: Secondary | ICD-10-CM

## 2018-11-18 DIAGNOSIS — Z79899 Other long term (current) drug therapy: Secondary | ICD-10-CM | POA: Diagnosis not present

## 2018-11-18 DIAGNOSIS — F329 Major depressive disorder, single episode, unspecified: Secondary | ICD-10-CM

## 2018-11-18 LAB — COMPREHENSIVE METABOLIC PANEL
ALT: 50 U/L — ABNORMAL HIGH (ref 0–44)
AST: 32 U/L (ref 15–41)
Albumin: 4.2 g/dL (ref 3.5–5.0)
Alkaline Phosphatase: 61 U/L (ref 38–126)
Anion gap: 11 (ref 5–15)
BUN: 10 mg/dL (ref 6–20)
CO2: 27 mmol/L (ref 22–32)
Calcium: 9.3 mg/dL (ref 8.9–10.3)
Chloride: 101 mmol/L (ref 98–111)
Creatinine, Ser: 0.91 mg/dL (ref 0.44–1.00)
GFR calc Af Amer: >60 mL/min (ref >60)
GFR calc non Af Amer: >60 mL/min (ref >60)
Glucose, Bld: 88 mg/dL (ref 70–99)
Potassium: 2.9 mmol/L — ABNORMAL LOW (ref 3.5–5.1)
Sodium: 139 mmol/L (ref 135–145)
Total Bilirubin: 0.9 mg/dL (ref 0.3–1.2)
Total Protein: 7.5 g/dL (ref 6.5–8.1)

## 2018-11-18 LAB — URINALYSIS, ROUTINE W REFLEX MICROSCOPIC
Bilirubin Urine: NEGATIVE
Glucose, UA: NEGATIVE mg/dL
Ketones, ur: NEGATIVE mg/dL
LEUKOCYTE UA: NEGATIVE
Nitrite: NEGATIVE
PROTEIN: NEGATIVE mg/dL
Specific Gravity, Urine: 1.003 — ABNORMAL LOW (ref 1.005–1.030)
pH: 6 (ref 5.0–8.0)

## 2018-11-18 LAB — ETHANOL: Alcohol, Ethyl (B): <10 mg/dL (ref <10)

## 2018-11-18 LAB — CBC WITH DIFFERENTIAL/PLATELET
Abs Immature Granulocytes: 0.06 10*3/uL (ref 0.00–0.07)
Basophils Absolute: 0.1 10*3/uL (ref 0.0–0.1)
Basophils Relative: 1 %
Eosinophils Absolute: 0.2 10*3/uL (ref 0.0–0.5)
Eosinophils Relative: 2 %
HCT: 47.9 % — ABNORMAL HIGH (ref 36.0–46.0)
Hemoglobin: 15.5 g/dL — ABNORMAL HIGH (ref 12.0–15.0)
Immature Granulocytes: 0 %
Lymphocytes Relative: 23 %
Lymphs Abs: 3.5 10*3/uL (ref 0.7–4.0)
MCH: 29.6 pg (ref 26.0–34.0)
MCHC: 32.4 g/dL (ref 30.0–36.0)
MCV: 91.6 fL (ref 80.0–100.0)
Monocytes Absolute: 1.1 10*3/uL — ABNORMAL HIGH (ref 0.1–1.0)
Monocytes Relative: 8 %
Neutro Abs: 9.9 10*3/uL — ABNORMAL HIGH (ref 1.7–7.7)
Neutrophils Relative %: 66 %
Platelets: 564 10*3/uL — ABNORMAL HIGH (ref 150–400)
RBC: 5.23 MIL/uL — ABNORMAL HIGH (ref 3.87–5.11)
RDW: 14.9 % (ref 11.5–15.5)
WBC: 15 10*3/uL — ABNORMAL HIGH (ref 4.0–10.5)
nRBC: 0 % (ref 0.0–0.2)

## 2018-11-18 LAB — RAPID URINE DRUG SCREEN, HOSP PERFORMED
Amphetamines: NOT DETECTED
Barbiturates: NOT DETECTED
Benzodiazepines: NOT DETECTED
Cocaine: NOT DETECTED
Opiates: NOT DETECTED
Tetrahydrocannabinol: POSITIVE — AB

## 2018-11-18 MED ORDER — VENLAFAXINE HCL ER 37.5 MG PO CP24
37.5000 mg | ORAL_CAPSULE | Freq: Every day | ORAL | Status: DC
  Filled 2018-11-18: qty 1

## 2018-11-18 MED ORDER — POTASSIUM CHLORIDE CRYS ER 20 MEQ PO TBCR
40.0000 meq | EXTENDED_RELEASE_TABLET | Freq: Once | ORAL | Status: AC
  Administered 2018-11-18: 40 meq via ORAL
  Filled 2018-11-18: qty 2

## 2018-11-18 MED ORDER — MIRTAZAPINE 7.5 MG PO TABS
15.0000 mg | ORAL_TABLET | Freq: Every day | ORAL | Status: DC
  Administered 2018-11-18: 15 mg via ORAL
  Filled 2018-11-18: qty 2

## 2018-11-18 MED ORDER — ONDANSETRON HCL 4 MG PO TABS: 4.0000 mg | ORAL_TABLET | Freq: Three times a day (TID) | ORAL | Status: DC | PRN

## 2018-11-18 MED ORDER — HYDROXYZINE HCL 25 MG PO TABS: 25.0000 mg | ORAL_TABLET | Freq: Three times a day (TID) | ORAL | Status: DC | PRN

## 2018-11-18 MED ORDER — ASPIRIN EC 81 MG PO TBEC: 81.0000 mg | DELAYED_RELEASE_TABLET | Freq: Every day | ORAL | Status: DC

## 2018-11-18 MED ORDER — ATORVASTATIN CALCIUM 20 MG PO TABS
20.0000 mg | ORAL_TABLET | Freq: Every day | ORAL | Status: DC
  Filled 2018-11-18: qty 1

## 2018-11-18 MED ORDER — HYDROCHLOROTHIAZIDE 25 MG PO TABS
25.0000 mg | ORAL_TABLET | Freq: Every day | ORAL | Status: DC
  Filled 2018-11-18: qty 1

## 2018-11-18 MED ORDER — ACETAMINOPHEN 325 MG PO TABS
650.0000 mg | ORAL_TABLET | Freq: Once | ORAL | Status: AC
  Administered 2018-11-18: 650 mg via ORAL
  Filled 2018-11-18: qty 2

## 2018-11-18 NOTE — ED Provider Notes (Signed)
[facility] DEPT Provider Note   CSN: [id number] Arrival date & time: 11/18/18  1331    History   Chief Complaint Chief Complaint  Patient presents with  . Medical Clearance    HPI Alicia Robinson is a 43 y.o. female.     HPI   78yF who is here for psychiatric evaluation. She is not disclosing much to me though. The only things she would say is "I'm scared." "He is going to kill me." "I don't want to be here." "I want to go home." A friend is with her. He brought her to the hospital. She was crying on the phone and says she felt like she needed to come to the hospital. She did not specify why though. Past history of depression and prior overdose. No history of recent ingestion but pt isn't answering my questions either.   Past Medical History:  Diagnosis Date  . Depression   . Hypertension   . Suicidal overdose [facility])     Patient Active Problem List   Diagnosis Date Noted  . Cannabis use disorder, mild, abuse 09/22/2017  . MDD (major depressive disorder) 09/21/2017  . Hyperlipidemia 07/14/2017  . GERD (gastroesophageal reflux disease) 07/14/2017  . Hypokalemia 07/13/2017  . HTN (hypertension) 07/13/2017  . MDD (major depressive disorder), single episode, severe with psychosis ([place]) 07/11/2017    Past Surgical History:  Procedure Laterality Date  . CHOLECYSTECTOMY    . TUBAL LIGATION       OB History   No obstetric history on file.      Home Medications    Prior to Admission medications   Medication Sig Start Date End Date Taking? Authorizing Provider  aspirin EC 81 MG tablet Take 1 tablet (81 mg total) by mouth daily. For heart health 09/24/17   Lindell Spar I, NP  atorvastatin (LIPITOR) 20 MG tablet Take 1 tablet (20 mg total) by mouth daily. For high cholesterol 09/24/17   Lindell Spar I, NP  fluconazole (DIFLUCAN) 150 MG tablet Take 1 tablet (150 mg total) by mouth daily. (For 3 more days): For yeast infection 09/25/17   Lindell Spar  I, NP  hydrochlorothiazide (HYDRODIURIL) 25 MG tablet Take 1 tablet (25 mg total) by mouth daily. For high blood pressure 09/24/17   Lindell Spar I, NP  hydrOXYzine (ATARAX/VISTARIL) 25 MG tablet Take 1 tablet (25 mg total) by mouth 3 (three) times daily as needed for anxiety. 09/24/17   Lindell Spar I, NP  mirtazapine (REMERON) 15 MG tablet Take 1 tablet (15 mg total) by mouth at bedtime. For depression/insomnia 09/24/17   Lindell Spar I, NP  venlafaxine XR (EFFEXOR-XR) 37.5 MG 24 hr capsule Take 1 capsule (37.5 mg total) by mouth daily with breakfast. For depression 09/25/17   Encarnacion Slates, NP    Family History History reviewed. No pertinent family history.  Social History Social History   Tobacco Use  . Smoking status: Never Smoker  . Smokeless tobacco: Never Used  Substance Use Topics  . Alcohol use: Yes    Comment: weekends  . Drug use: Yes    Types: Marijuana     Allergies   Patient has no known allergies.   Review of Systems Review of Systems Level 5 caveat because pt is not answering questions.   Physical Exam Updated Vital Signs BP (!) 150/111 (BP Location: Left Arm) Comment: RN Edie Notified  Pulse (!) 101   Temp 97.6 F (36.4 C) (Oral)   Resp (!) 22  LMP 11/18/2018 (Exact Date)   SpO2 99%   Physical Exam Vitals signs and nursing note reviewed.  Constitutional:      General: She is not in acute distress.    Appearance: She is well-developed.     Comments: Laying in bed with covers held tightly against her. Appears anxious. Tearful.   HENT:     Head: Normocephalic and atraumatic.  Eyes:     General:        Right eye: No discharge.        Left eye: No discharge.     Conjunctiva/sclera: Conjunctivae normal.  Neck:     Musculoskeletal: Neck supple.  Cardiovascular:     Rate and Rhythm: Normal rate and regular rhythm.     Heart sounds: Normal heart sounds. No murmur. No friction rub. No gallop.   Pulmonary:     Effort: Pulmonary effort is normal. No  respiratory distress.     Breath sounds: Normal breath sounds.  Abdominal:     General: There is no distension.     Palpations: Abdomen is soft.     Tenderness: There is no abdominal tenderness.  Musculoskeletal:        General: No tenderness.  Skin:    General: Skin is warm and dry.  Neurological:     Mental Status: She is alert.  Psychiatric:        Behavior: Behavior normal.        Thought Content: Thought content normal.      ED Treatments / Results  Labs (all labs ordered are listed, but only abnormal results are displayed) Labs Reviewed  COMPREHENSIVE METABOLIC PANEL  ETHANOL  RAPID URINE DRUG SCREEN, HOSP PERFORMED  CBC WITH DIFFERENTIAL/PLATELET  I-STAT BETA HCG BLOOD, ED ([facility], [facility], [facility] ONLY)  I-STAT BETA HCG BLOOD, ED ([facility], [facility], [facility] ONLY)    EKG None  Radiology No results found.  Procedures Procedures (including critical care time)  Medications Ordered in ED Medications  ondansetron (ZOFRAN) tablet 4 mg (has no administration in time range)  aspirin EC tablet 81 mg (has no administration in time range)  atorvastatin (LIPITOR) tablet 20 mg (has no administration in time range)  hydrochlorothiazide (HYDRODIURIL) tablet 25 mg (has no administration in time range)  hydrOXYzine (ATARAX/VISTARIL) tablet 25 mg (has no administration in time range)  venlafaxine XR (EFFEXOR-XR) 24 hr capsule 37.5 mg (has no administration in time range)  mirtazapine (REMERON) tablet 15 mg (has no administration in time range)     Initial Impression / Assessment and Plan / ED Course  I have reviewed the triage vital signs and the nursing notes.  Pertinent labs & imaging results that were available during my care of the patient were reviewed by me and considered in my medical decision making (see chart for details).  18yF presenting for psychiatric evaluation. Not much acute history available otherwise. Appears stable but needs medical screening. Hx of overdose but not recent  inappropriate ingestions reported. She appears anxious/tearful but not toxic. TTS evaluation.   Final Clinical Impressions(s) / ED Diagnoses   Final diagnoses:  Depression, unspecified depression type    ED Discharge Orders    None       Virgel Manifold, MD 11/18/18 1457

## 2018-11-18 NOTE — BH Assessment (Addendum)
Assessment Note  Alicia Robinson is an 43 y.o. female presenting voluntarily to [facility] ED for "confusion." She is a poor historian due to [place]. Patient is accompanied by a friend, Marcello Moores [phone], who provides collateral information during assessment. Patient's appearance is bizarre and initially non-verbal. When she does chose to speak her tone and mannerisms are child-like in nature. Her speech is soft, nearly inaudible.  She often times pouts and looks to [facility] for validation or for him to answer questions. Patient cannot remember why she came to the ED initially and states "I'm confused, my head hurts." When asked furthering questions she begins to pout her lower lip out and lean into [place], who rubs her back and provides reassurance. Marcello Moores, who initially introduced himself as her son later states he "isn't biological" but that she is a mother like figure who he met in patient at [facility] in 2019. Patient endorses passive suicidal thoughts without intent or plan. Patient states "I am the problem, I make my children sad." Patient was last in patient at [facility] in 2019 after overdosing on 30-60 Lamictdal. Patient has numerous prior attempts. Patient denies any thoughts of harming others. Patient reports visual hallucinations of "a man with a white face and red cheeks. Like IT." She states he is threatening her and must stay quiet or he will kill her. Patient denies taking any medications or seeing an outpatient therapist. Collateral reports she works 2 jobs and lives with her children. Patient states she sleeps well but does not have an appetite. This clinician attempted to assess for trauma history but just pouts and turns head away.   Patient is alert but not oriented. She cannot state her birthday, today's date, or reason for being in the hospital. She is dressed in scrubs and wearing a pink beanie. Her speech is soft and at times refuses to speak. Her mood is depressed/ anxious and her affect is child-like. Her  insight, judgement, and impulse control are impaired. Patient appears to be responding to internal stimuli and delusional.  Diagnosis: F33.3 MDD, recurrent, severe, with psychotic features  Past Medical History:  Past Medical History:  Diagnosis Date  . Depression   . Hypertension   . Suicidal overdose [facility])     Past Surgical History:  Procedure Laterality Date  . CHOLECYSTECTOMY    . TUBAL LIGATION      Family History: History reviewed. No pertinent family history.  Social History:  reports that she has never smoked. She has never used smokeless tobacco. She reports current alcohol use. She reports current drug use. Drug: Marijuana.  Additional Social History:  Alcohol / Drug Use Pain Medications: see MAR Prescriptions: see MAR Over the Counter: see MAR History of alcohol / drug use?: No history of alcohol / drug abuse Longest period of sobriety (when/how long): UTA  CIWA: CIWA-Ar BP: (!) 150/111(RN Edie Notified) Pulse Rate: (!) 101 COWS:    Allergies: No Known Allergies  Home Medications: (Not in a hospital admission)   OB/GYN Status:  Patient's last menstrual period was 11/18/2018 (exact date).  General Assessment Data [facility] of [facility]: [facility] TTS Assessment: In system Is this a Tele or Face-to-Face Assessment?: Face-to-Face Is this an Initial Assessment or a Re-assessment for this encounter?: Initial Assessment Patient Accompanied by:: Other(friend, Marcello Moores) Language Other than English: No Living Arrangements: (apartment) What gender do you identify as?: Female Marital status: Single Maiden name: Himes Pregnancy Status: Unable to assess Living Arrangements: Children Can pt return to current living arrangement?:  Yes Admission Status: Voluntary Is patient capable of signing voluntary admission?: Yes Referral Source: Self/Family/Friend Insurance type: Medicaid     Crisis Care Plan Living Arrangements: Children Legal Guardian: (self) Name of  Psychiatrist: none Name of Therapist: none  Education Status Is patient currently in school?: No Is the patient employed, unemployed or receiving disability?: Employed  Risk to self with the past 6 months Suicidal Ideation: Yes-Currently Present Has patient been a risk to self within the past 6 months prior to admission? : No Suicidal Intent: No Has patient had any suicidal intent within the past 6 months prior to admission? : (UTA) Is patient at risk for suicide?: (UTA) Suicidal Plan?: No Has patient had any suicidal plan within the past 6 months prior to admission? : (UTA) Access to Means: (UTA) What has been your use of drugs/alcohol within the last 12 months?: THC use on 2/25 Previous Attempts/Gestures: Yes How many times?: 3 Other Self Harm Risks: none noted Triggers for Past Attempts: None known Intentional Self Injurious Behavior: None Family Suicide History: No Recent stressful life event(s): Recent negative physical changes(headaches, confusion) Persecutory voices/beliefs?: Yes Depression: Yes Depression Symptoms: Despondent, Tearfulness, Isolating, Fatigue, Guilt, Loss of interest in usual pleasures, Feeling worthless/self pity, Feeling angry/irritable Substance abuse history and/or treatment for substance abuse?: No Suicide prevention information given to non-admitted patients: Not applicable  Risk to Others within the past 6 months Homicidal Ideation: No Does patient have any lifetime risk of violence toward others beyond the six months prior to admission? : No Thoughts of Harm to Others: No Current Homicidal Intent: No Current Homicidal Plan: No Access to Homicidal Means: No Identified Victim: none History of harm to others?: No Assessment of Violence: None Noted Violent Behavior Description: (none noted) Does patient have access to weapons?: No Criminal Charges Pending?: No Does patient have a court date: No Is patient on probation?:  No  Psychosis Hallucinations: Auditory, Visual Delusions: None noted  Mental Status Report Appearance/Hygiene: In scrubs Eye Contact: Poor Motor Activity: Freedom of movement Speech: Soft, Elective mutism, Slow Level of Consciousness: Quiet/awake Mood: Depressed, Anxious Affect: Depressed(child-like) Anxiety Level: Moderate Thought Processes: Unable to Assess Judgement: Impaired Orientation: Person, Place Obsessive Compulsive Thoughts/Behaviors: None  Cognitive Functioning Concentration: Poor Memory: Recent Impaired, Remote Impaired Is patient IDD: No Insight: Poor Impulse Control: Poor Appetite: Poor Have you had any weight changes? : Loss Amount of the weight change? (lbs): (UTA) Sleep: No Change Total Hours of Sleep: 8 Vegetative Symptoms: None  ADLScreening [facility] Assessment Services) Patient's cognitive ability adequate to safely complete daily activities?: Yes Patient able to express need for assistance with ADLs?: No Independently performs ADLs?: Yes (appropriate for developmental age)  Prior Inpatient Therapy Prior Inpatient Therapy: Yes Prior Therapy Dates: 2019, 2018 Prior Therapy Facilty/Provider([facility]): [facility] Reason for Treatment: suicide attempt  Prior Outpatient Therapy Prior Outpatient Therapy: Yes Prior Therapy Dates: 2018 Prior Therapy Facilty/Provider(s): [facility] Reason for Treatment: depression Does patient have an ACCT team?: No Does patient have Intensive In-House Services?  : No Does patient have [facility] services? : No Does patient have P4CC services?: No  ADL Screening (condition at time of admission) Patient's cognitive ability adequate to safely complete daily activities?: Yes Is the patient deaf or have difficulty hearing?: No Does the patient have difficulty seeing, even when wearing glasses/contacts?: No Does the patient have difficulty concentrating, remembering, or making decisions?: Yes Patient able to express need for  assistance with ADLs?: No Does the patient have difficulty dressing or bathing?:  No Independently performs ADLs?: Yes (appropriate for developmental age) Does the patient have difficulty walking or climbing stairs?: No Weakness of Legs: None Weakness of Arms/Hands: None  Home Assistive Devices/Equipment Home Assistive Devices/Equipment: None  Therapy Consults (therapy consults require a physician order) PT Evaluation Needed: No OT Evalulation Needed: No SLP Evaluation Needed: No Abuse/Neglect Assessment (Assessment to be complete while patient is alone) Abuse/Neglect Assessment Can Be Completed: Unable to assess, patient is non-responsive or altered mental status Values / Beliefs Cultural Requests During Hospitalization: None Spiritual Requests During Hospitalization: None Consults Spiritual Care Consult Needed: No Social Work Consult Needed: No Regulatory affairs officer (For Healthcare) Does Patient Have a Medical Advance Directive?: No Would patient like information on creating a medical advance directive?: No - Patient declined          Disposition: Jinny Blossom, NP recommends in patient treatment. Disposition Initial Assessment Completed for this Encounter: Yes  On Site Evaluation by:   Reviewed with Physician:    Orvis Brill 11/18/2018 3:34 PM

## 2018-11-18 NOTE — ED Notes (Signed)
Bed: [facility] Expected date:  Expected time:  Means of arrival:  Comments: triage

## 2018-11-18 NOTE — ED Notes (Signed)
EKG seen by Dr. Ronnald Nian.

## 2018-11-18 NOTE — ED Provider Notes (Signed)
Patient was seen by Dr. Wilson Singer. Patient primarily in the emergency room for what appears to be psychosis. I have followed up on the lab results.  She has mild white count elevation but the chest x-ray and the urine analysis are normal.  She has low potassium at 2.9.  Patient was given 80 mEq of oral potassium.  She is medically cleared at this time   Varney Biles, MD 11/18/18 2124

## 2018-11-18 NOTE — ED Notes (Signed)
Bed: [facility] Expected date:  Expected time:  Means of arrival:  Comments: 

## 2018-11-18 NOTE — BH Assessment (Signed)
[place] Assessment Progress Note  Clinician confirmed w/ [facility] Kieth Brightly that patient ws going to [facility] [phone].  She said that patient could come over after 23:00.  Clinician informed nurse Mortimer Fries and obtained patient signature on voluntary admission papers.

## 2018-11-18 NOTE — ED Notes (Signed)
Pt waiting acceptable lab results before transport to [facility]

## 2018-11-18 NOTE — ED Triage Notes (Signed)
Pt arrived to unit tearful.  Pt will not engage in assessment and is holding head with eyes closed and rocking.  Pt appears non-verbal but I have no pre-hospital information.  There is a phone number at bedside for Marcello Moores and patient shook her head yes when asked if he brought her to hospital.   Pt has decided to speak and slowly told me her name and that she is "confused."

## 2018-11-19 ENCOUNTER — Encounter (HOSPITAL_COMMUNITY): Payer: Self-pay

## 2018-11-19 ENCOUNTER — Inpatient Hospital Stay (HOSPITAL_COMMUNITY)
Admission: AD | Admit: 2018-11-19 | Discharge: 2018-11-26 | DRG: 885 | Disposition: A | Discharge status: Home / Self Care | Payer: BLUE CROSS/BLUE SHIELD | Source: Intra-hospital | Attending: Psychiatry | Admitting: Psychiatry

## 2018-11-19 ENCOUNTER — Other Ambulatory Visit: Payer: Self-pay

## 2018-11-19 DIAGNOSIS — Z915 Personal history of self-harm: Secondary | ICD-10-CM

## 2018-11-19 DIAGNOSIS — T43026A Underdosing of tetracyclic antidepressants, initial encounter: Secondary | ICD-10-CM | POA: Diagnosis not present

## 2018-11-19 DIAGNOSIS — K219 Gastro-esophageal reflux disease without esophagitis: Secondary | ICD-10-CM | POA: Diagnosis present

## 2018-11-19 DIAGNOSIS — Z9049 Acquired absence of other specified parts of digestive tract: Secondary | ICD-10-CM

## 2018-11-19 DIAGNOSIS — F419 Anxiety disorder, unspecified: Secondary | ICD-10-CM | POA: Diagnosis present

## 2018-11-19 DIAGNOSIS — F333 Major depressive disorder, recurrent, severe with psychotic symptoms: Principal | ICD-10-CM | POA: Diagnosis present

## 2018-11-19 DIAGNOSIS — I10 Essential (primary) hypertension: Secondary | ICD-10-CM | POA: Diagnosis present

## 2018-11-19 DIAGNOSIS — R45851 Suicidal ideations: Secondary | ICD-10-CM | POA: Diagnosis present

## 2018-11-19 DIAGNOSIS — F1220 Cannabis dependence, uncomplicated: Secondary | ICD-10-CM | POA: Diagnosis present

## 2018-11-19 DIAGNOSIS — E785 Hyperlipidemia, unspecified: Secondary | ICD-10-CM | POA: Diagnosis present

## 2018-11-19 DIAGNOSIS — Z79899 Other long term (current) drug therapy: Secondary | ICD-10-CM

## 2018-11-19 DIAGNOSIS — T43216A Underdosing of selective serotonin and norepinephrine reuptake inhibitors, initial encounter: Secondary | ICD-10-CM | POA: Diagnosis not present

## 2018-11-19 DIAGNOSIS — Z9851 Tubal ligation status: Secondary | ICD-10-CM

## 2018-11-19 DIAGNOSIS — Y92230 Patient room in hospital as the place of occurrence of the external cause: Secondary | ICD-10-CM | POA: Diagnosis not present

## 2018-11-19 DIAGNOSIS — Z91128 Patient's intentional underdosing of medication regimen for other reason: Secondary | ICD-10-CM

## 2018-11-19 DIAGNOSIS — Z7982 Long term (current) use of aspirin: Secondary | ICD-10-CM | POA: Diagnosis not present

## 2018-11-19 LAB — LIPID PANEL
Cholesterol: 134 mg/dL (ref 0–200)
HDL: 41 mg/dL (ref >40)
LDL Cholesterol: 77 mg/dL (ref 0–99)
TRIGLYCERIDES: 81 mg/dL (ref <150)
Total CHOL/HDL Ratio: 3.3 RATIO
VLDL: 16 mg/dL (ref 0–40)

## 2018-11-19 LAB — HEMOGLOBIN A1C
Hgb A1c MFr Bld: 5.4 % (ref 4.8–5.6)
Mean Plasma Glucose: 108.28 mg/dL

## 2018-11-19 LAB — TSH: TSH: 1.578 u[IU]/mL (ref 0.350–4.500)

## 2018-11-19 MED ORDER — MAGNESIUM HYDROXIDE 400 MG/5ML PO SUSP: 30.0000 mL | Freq: Every day | ORAL | Status: DC | PRN

## 2018-11-19 MED ORDER — HYDROXYZINE HCL 25 MG PO TABS: 25.0000 mg | ORAL_TABLET | Freq: Three times a day (TID) | ORAL | Status: DC | PRN

## 2018-11-19 MED ORDER — VENLAFAXINE HCL ER 75 MG PO CP24
75.0000 mg | ORAL_CAPSULE | Freq: Every day | ORAL | Status: DC
  Administered 2018-11-20: 75 mg via ORAL
  Filled 2018-11-19 (×2): qty 1

## 2018-11-19 MED ORDER — HYDROXYZINE HCL 25 MG PO TABS
25.0000 mg | ORAL_TABLET | Freq: Three times a day (TID) | ORAL | Status: DC | PRN
  Administered 2018-11-23: 25 mg via ORAL
  Filled 2018-11-19: qty 1

## 2018-11-19 MED ORDER — TRAZODONE HCL 50 MG PO TABS: 50.0000 mg | ORAL_TABLET | Freq: Every evening | ORAL | Status: DC | PRN

## 2018-11-19 MED ORDER — MIRTAZAPINE 15 MG PO TABS
15.0000 mg | ORAL_TABLET | Freq: Every day | ORAL | Status: DC
  Administered 2018-11-19: 15 mg via ORAL
  Filled 2018-11-19 (×5): qty 1

## 2018-11-19 MED ORDER — ALUM & MAG HYDROXIDE-SIMETH 200-200-20 MG/5ML PO SUSP: 30.0000 mL | ORAL | Status: DC | PRN

## 2018-11-19 MED ORDER — ATORVASTATIN CALCIUM 20 MG PO TABS
20.0000 mg | ORAL_TABLET | Freq: Every day | ORAL | Status: DC
  Administered 2018-11-19 – 2018-11-26 (×8): 20 mg via ORAL
  Filled 2018-11-19 (×5): qty 1
  Filled 2018-11-19: qty 2
  Filled 2018-11-19 (×5): qty 1

## 2018-11-19 MED ORDER — ZOLPIDEM TARTRATE 5 MG PO TABS
10.0000 mg | ORAL_TABLET | Freq: Every evening | ORAL | Status: DC | PRN
  Administered 2018-11-20 – 2018-11-21 (×2): 10 mg via ORAL
  Filled 2018-11-19 (×2): qty 2

## 2018-11-19 MED ORDER — ONDANSETRON HCL 4 MG PO TABS: 4.0000 mg | ORAL_TABLET | Freq: Three times a day (TID) | ORAL | Status: DC | PRN

## 2018-11-19 MED ORDER — VENLAFAXINE HCL ER 37.5 MG PO CP24
37.5000 mg | ORAL_CAPSULE | Freq: Every day | ORAL | Status: DC
  Administered 2018-11-19: 37.5 mg via ORAL
  Filled 2018-11-19 (×2): qty 1

## 2018-11-19 MED ORDER — HYDROCHLOROTHIAZIDE 25 MG PO TABS
25.0000 mg | ORAL_TABLET | Freq: Every day | ORAL | Status: DC
  Administered 2018-11-19 – 2018-11-26 (×8): 25 mg via ORAL
  Filled 2018-11-19 (×11): qty 1

## 2018-11-19 MED ORDER — ACETAMINOPHEN 325 MG PO TABS
650.0000 mg | ORAL_TABLET | Freq: Four times a day (QID) | ORAL | Status: DC | PRN
  Administered 2018-11-24: 650 mg via ORAL
  Filled 2018-11-19: qty 2

## 2018-11-19 MED ORDER — ASPIRIN EC 81 MG PO TBEC
81.0000 mg | DELAYED_RELEASE_TABLET | Freq: Every day | ORAL | Status: DC
  Administered 2018-11-19 – 2018-11-26 (×8): 81 mg via ORAL
  Filled 2018-11-19 (×11): qty 1

## 2018-11-19 MED ORDER — PERPHENAZINE 2 MG PO TABS
2.0000 mg | ORAL_TABLET | Freq: Two times a day (BID) | ORAL | Status: DC
  Administered 2018-11-19 – 2018-11-20 (×3): 2 mg via ORAL
  Filled 2018-11-19 (×5): qty 1

## 2018-11-19 NOTE — Progress Notes (Signed)
Nursing Progress Note: 7p-7a D: Pt currently presents with a depressed/pleasant on approach affect and behavior. Interacting minimally with the milieu. Pt reports good sleep during the previous night with current medication regimen. Pt did attend wrap-up group.  A: Pt provided with medications per providers orders. Pt's labs and vitals were monitored throughout the night. Pt supported emotionally and encouraged to express concerns and questions. Pt educated on medications.  R: Pt's safety ensured with 15 minute and environmental checks. Pt currently denies SI, HI, and endorses AVH. Pt verbally contracts to seek staff if SI,HI, or AVH occurs and to consult with staff before acting on any harmful thoughts. Will continue to monitor.
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
## 2018-11-19 NOTE — Progress Notes (Signed)
Patient presents with depressed/flat affect and behavior during admission interview and assessment. VS monitored and recorded. Skin check performed with Delicia MHT and revealed skin intact. Contraband was not found. Patient was oriented to unit and schedule. Pt states "I am anxious and drained from my son and his family living off of me. I am barely making rent". Pt denies HI and endorses passive SI and intermittent AVH at this time. PO fluids provided. Safety maintained. Rest encouraged.

## 2018-11-19 NOTE — Progress Notes (Signed)
Adult Psychoeducational Group Note  Date:  11/19/2018 Time:  8:46 PM  Group Topic/Focus:  Wrap-Up Group:   The focus of this group is to help patients review their daily goal of treatment and discuss progress on daily workbooks.  Participation Level:  Active  Participation Quality:  Appropriate  Affect:  Appropriate  Cognitive:  Appropriate  Insight: Appropriate  Engagement in Group:  Engaged  Modes of Intervention:  Discussion  Additional Comments: The patient expressed that she rates today a 7.The patient also said that she attend groups.  Nash Shearer 11/19/2018, 8:46 PM

## 2018-11-19 NOTE — BHH Group Notes (Signed)
[facility] LCSW Group Therapy Note  Date/Time: 11/19/18, 1315  Type of Therapy/Topic:  Group Therapy:  Balance in Life  Participation Level:  moderate  Description of Group:    This group will address the concept of balance and how it feels and looks when one is unbalanced. Patients will be encouraged to process areas in their lives that are out of balance, and identify reasons for remaining unbalanced. Facilitators will guide patients utilizing problem- solving interventions to address and correct the stressor making their life unbalanced. Understanding and applying boundaries will be explored and addressed for obtaining  and maintaining a balanced life. Patients will be encouraged to explore ways to assertively make their unbalanced needs known to significant others in their lives, using other group members and facilitator for support and feedback.  Therapeutic Goals: 1. Patient will identify two or more emotions or situations they have that consume much of in their lives. 2. Patient will identify signs/triggers that life has become out of balance:  3. Patient will identify two ways to set boundaries in order to achieve balance in their lives:  4. Patient will demonstrate ability to communicate their needs through discussion and/or role plays  Summary of Patient Progress:Pt shared that physical, mental/emotional, and family are areas of her life that are out of balance.  Pt attentive in group and made several good comments in response to CSW questions.            Therapeutic Modalities:   Cognitive Behavioral Therapy Solution-Focused Therapy Assertiveness Training  Lurline Idol, [place]

## 2018-11-19 NOTE — Plan of Care (Signed)
  Problem: Activity: Goal: Interest or engagement in activities will improve Outcome: Progressing   Problem: Safety: Goal: Periods of time without injury will increase Outcome: Progressing  DAR NOTE: Patient presents with flat affect and depressed mood.  Denies pain, auditory and visual hallucinations.  Described energy level as low and concentration as poor.  Rates depression at 0, hopelessness at 0, and anxiety at 0.  Maintained on routine safety checks.  Medications given as prescribed.  Support and encouragement offered as needed.  Attended group and participated.  Patient visible in milieu coloring with minimal interaction.  Offered no complaint.

## 2018-11-19 NOTE — H&P (Signed)
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 Further the patient is reporting visual hallucinations for the first time, seeing a clown in front of her telling her to commit suicide and she identifies him as "Penny-Wise" from a movie  Despite her regression, near mutism and disorganized thoughts on presentation 3/4 today she is more coherent and organized able to articulate that she is in fact depressed that previous antidepressants have helped briefly but cannot recall one that had sustained benefit, reporting the visual hallucination recently but not today, reporting continued thoughts of not wanting to be here thus passive suicidal thoughts without plans or intent.  Her last hospitalization was in January 2019, at that point she had overdosed on lamotrigine and required medical clearance.  She was engage in cognitive-based therapy and started on venlafaxine for depressive symptoms but it was a low dose which she remains on.  The present time again she is alert oriented to person place time and situation affect appropriate for the situation nonaggressive non-tearful but affect constricted no thoughts of harming others but thoughts of not wanting to be here as mentioned recent but no current visual  and auditory hallucinations no visual hallucinations at present   Associated Signs/Symptoms: Depression Symptoms:  psychomotor retardation, (Hypo) Manic Symptoms:  Hallucinations, Anxiety Symptoms:  Excessive Worry, Psychotic Symptoms:  Hallucinations: Auditory PTSD Symptoms: NA Total Time spent with patient: 45 minutes  Past Psychiatric History: Extensive with multiple self-harm attempts  Is the patient at risk to self? Yes.    Has the patient been a risk to self in the past 6 months? Yes.    Has the patient been a risk to self within the distant past? Yes.    Is the patient a risk to others? No.  Has the patient been a risk to others in the past 6 months? No.  Has the patient been a risk to others within the distant past? No.   Alcohol Screening: 1. How often do you have a drink containing alcohol?: Never 2. How many drinks containing alcohol do you have on a typical day when you are drinking?: 1 or 2 3. How often do you have six or more drinks on one occasion?: Never AUDIT-C Score: 0 4. How often during the last year have you found that you were not able to stop drinking once you had started?: Never 5. How often during the last year have you failed to do what was normally expected from you becasue of drinking?: Never 6. How often during the last year have you needed a first drink in the morning to get yourself going after a heavy drinking session?: Never 7. How often during the last year have you had a feeling of guilt of remorse after drinking?: Never 8. How often during the last year have you been unable to remember what happened the night before because you had been drinking?: Never  9. Have you or someone else been injured as a result of your drinking?: No 10. Has a relative or friend or a doctor or another health worker been concerned about your drinking or suggested you cut down?: No Alcohol Use Disorder Identification Test Final Score (AUDIT): 0 Substance Abuse History in  the last 12 months:  Yes.   Consequences of Substance Abuse: Medical Consequences:  Contribution to chronic psychosis from cannabis dependency Previous Psychotropic Medications: Yes  Psychological Evaluations: No  Past Medical History:  Past Medical History:  Diagnosis Date  . Depression   . Hypertension   . Suicidal overdose [facility])     Past Surgical History:  Procedure Laterality Date  . CHOLECYSTECTOMY    . TUBAL LIGATION     Family History: History reviewed. No pertinent family history. Tobacco Screening:   Social History:  Social History   Substance and Sexual Activity  Alcohol Use Yes   Comment: weekends     Social History   Substance and Sexual Activity  Drug Use Yes  . Types: Marijuana    Additional Social History:                           Allergies:  No Known Allergies Lab Results:  Results for orders placed or performed during the hospital encounter of 11/19/18 (from the past 48 hour(s))  Lipid panel     Status: None   Collection Time: 11/19/18  6:33 AM  Result Value Ref Range   Cholesterol 134 0 - 200 mg/dL   Triglycerides 81 <150 mg/dL   HDL 41 >40 mg/dL   Total CHOL/HDL Ratio 3.3 RATIO   VLDL 16 0 - 40 mg/dL   LDL Cholesterol 77 0 - 99 mg/dL    Comment:        Total Cholesterol/HDL:CHD Risk Coronary Heart Disease Risk Table                     Men   Women  1/2 Average Risk   3.4   3.3  Average Risk       5.0   4.4  2 X Average Risk   9.6   7.1  3 X Average Risk  23.4   11.0        Use the calculated Patient Ratio above and the CHD Risk Table to determine the patient's CHD Risk.        ATP III CLASSIFICATION (LDL):  <100     mg/dL   Optimal  100-129  mg/dL   Near or Above                    Optimal  130-159  mg/dL   Borderline  160-189  mg/dL   High  >190     mg/dL   Very High Performed at [facility] [address], [place], [place] [address]   TSH     Status: None   Collection Time: 11/19/18  6:33 AM   Result Value Ref Range   TSH 1.578 0.350 - 4.500 uIU/mL    Comment: Performed by a 3rd Generation assay with a functional sensitivity of <=0.01 uIU/mL. Performed at [facility], [place] [address], [place], [place] [address]     Blood Alcohol level:  Lab Results  Component Value Date   [facility] <10 11/18/2018   ETH <10 85/46/2703    Metabolic Disorder Labs:  Lab Results  Component  Value Date   HGBA1C 5.3 09/22/2017   MPG 105.41 09/22/2017   MPG 108.28 07/11/2017   No results found for: PROLACTIN Lab Results  Component Value Date   CHOL 134 11/19/2018   TRIG 81 11/19/2018   HDL 41 11/19/2018   CHOLHDL 3.3 11/19/2018   VLDL 16 11/19/2018   LDLCALC 77 11/19/2018   LDLCALC 189 (H) 09/22/2017    Current Medications: Current Facility-Administered Medications  Medication Dose Route Frequency Provider Last Rate Last Dose  . acetaminophen (TYLENOL) tablet 650 mg  650 mg Oral Q6H PRN Ethelene Hal, NP      . alum & mag hydroxide-simeth (MAALOX/MYLANTA) 200-200-20 MG/5ML suspension 30 mL  30 mL Oral Q4H PRN Ethelene Hal, NP      . aspirin EC tablet 81 mg  81 mg Oral Daily Ethelene Hal, NP   81 mg at 11/19/18 0831  . atorvastatin (LIPITOR) tablet 20 mg  20 mg Oral Daily Ethelene Hal, NP   20 mg at 11/19/18 0831  . hydrochlorothiazide (HYDRODIURIL) tablet 25 mg  25 mg Oral Daily Ethelene Hal, NP   25 mg at 11/19/18 6433  . hydrOXYzine (ATARAX/VISTARIL) tablet 25 mg  25 mg Oral TID PRN Ethelene Hal, NP      . magnesium hydroxide (MILK OF MAGNESIA) suspension 30 mL  30 mL Oral Daily PRN Ethelene Hal, NP      . mirtazapine (REMERON) tablet 15 mg  15 mg Oral QHS Ethelene Hal, NP      . ondansetron [facility]) tablet 4 mg  4 mg Oral Q8H PRN Ethelene Hal, NP      . perphenazine (TRILAFON) tablet 2 mg  2 mg Oral BID Johnn Hai, MD      . Derrill Memo ON 11/20/2018] venlafaxine XR (EFFEXOR-XR) 24 hr capsule  75 mg  75 mg Oral Q breakfast Johnn Hai, MD      . zolpidem Lorrin Mais) tablet 10 mg  10 mg Oral QHS PRN Johnn Hai, MD       PTA Medications: Medications Prior to Admission  Medication Sig Dispense Refill Last Dose  . Ascorbic Acid (VITAMIN C) 100 MG tablet Take 100 mg by mouth daily.   11/17/2018 at Unknown time  . aspirin EC 81 MG tablet Take 1 tablet (81 mg total) by mouth daily. For heart health   11/18/2018 at Unknown time  . atorvastatin (LIPITOR) 20 MG tablet Take 1 tablet (20 mg total) by mouth daily. For high cholesterol   11/18/2018 at Unknown time  . cholecalciferol (VITAMIN D3) 25 MCG (1000 UT) tablet Take 1,000 Units by mouth daily.   11/17/2018 at Unknown time  . fluconazole (DIFLUCAN) 150 MG tablet Take 1 tablet (150 mg total) by mouth daily. (For 3 more days): For yeast infection (Patient not taking: Reported on 11/18/2018) 3 tablet 0 Completed Course at Unknown time  . GARLIC PO Take 1 capsule by mouth daily.   11/17/2018 at Unknown time  . hydrochlorothiazide (HYDRODIURIL) 25 MG tablet Take 1 tablet (25 mg total) by mouth daily. For high blood pressure 30 tablet 0 11/18/2018 at Unknown time  . Multiple Vitamins-Minerals (HAIR SKIN AND NAILS FORMULA PO) Take 1 tablet by mouth daily.   11/17/2018 at Unknown time  . Multiple Vitamins-Minerals (MULTIVITAMIN ADULT PO) Take 1 tablet by mouth.   11/17/2018 at Unknown time  . Omega-3 Fatty Acids (FISH OIL) 1000 MG CAPS Take 1 capsule by mouth daily.   11/17/2018 at  Unknown time  . omeprazole (PRILOSEC) 20 MG capsule Take 20 mg by mouth 2 (two) times daily before a meal.    11/18/2018 at Unknown time  . thiamine (VITAMIN B-1) 100 MG tablet Take 100 mg by mouth daily.   11/17/2018 at Unknown time    Musculoskeletal: Strength & Muscle Tone: within normal limits Gait & Station: normal Patient leans: N/A  Psychiatric Specialty Exam: Physical Exam  ROS  Blood pressure 120/78, pulse (!) 106, temperature 98.1 F (36.7 C), temperature source Oral, last  menstrual period 11/18/2018.There is no height or weight on file to calculate BMI.  General Appearance: Casual  Eye Contact:  Fair  Speech:  Normal Rate  Volume:  Decreased  Mood:  Dysphoric  Affect:  Depressed  Thought Process:  Goal Directed  Orientation:  Full (Time, Place, and Person)  Thought Content:  Tangential  Suicidal Thoughts:  Yes.  without intent/plan  Homicidal Thoughts:  No  Memory:  Immediate;   Fair  Judgement:  Fair  Insight:  Fair  Psychomotor Activity:  Decreased  Concentration:  Concentration: Fair  Recall:  [employer] of Knowledge:  Fair  Language:  Fair  Akathisia:  Negative  Handed:  Right  AIMS (if indicated):     Assets:  Communication Skills Desire for Improvement Financial Resources/Insurance  ADL's:  Intact  Cognition:  WNL  Sleep:  Number of Hours: 3    Treatment Plan Summary: Daily contact with patient to assess and evaluate symptoms and progress in treatment and Medication management  Observation Level/Precautions:  15 minute checks  Laboratory:  UDS  Psychotherapy: Cognitive-based/reality-based  Medications: Escalate venlafaxine add perphenazine continue mirtazapine  Consultations: Not necessary  Discharge Concerns: Long-term improvement  Estimated LOS: 5-7  Other: Axis I depression recurrent severe with psychosis/cannabis dependence   Physician Treatment Plan for Primary Diagnosis: <principal problem not specified> Long Term Goal(s): Improvement in symptoms so as ready for discharge  Short Term Goals: Ability to demonstrate self-control will improve  Physician Treatment Plan for Secondary Diagnosis: Active Problems:   MDD (major depressive disorder), recurrent, severe, with psychosis ([place])  Long Term Goal(s): Improvement in symptoms so as ready for discharge  Short Term Goals: Compliance with prescribed medications will improve  I certify that inpatient services furnished can reasonably be expected to improve the patient's  condition.    Johnn Hai, MD 3/5/20209:36 AM
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
## 2018-11-19 NOTE — ED Notes (Signed)
ED TO INPATIENT HANDOFF REPORT  ED Nurse Name and Phone #: Mortimer Fries [phone]  S Name/Age/Gender Alicia Robinson 43 y.o. female Room/Bed: WA27/WA27  Code Status   Code Status: Full Code  Home/SNF/Other Home Patient oriented to: self Is this baseline? Yes      Chief Complaint pysche eval  Triage Note Pt arrived to unit tearful.  Pt will not engage in assessment and is holding head with eyes closed and rocking.  Pt appears non-verbal but I have no pre-hospital information.  There is a phone number at bedside for Marcello Moores and patient shook her head yes when asked if he brought her to hospital.   Pt has decided to speak and slowly told me her name and that she is "confused."   Allergies No Known Allergies  Level of Care/Admitting Diagnosis ED Disposition    ED Disposition Condition Comment   Transfer to Another Facility  Transfer to [facility], bed 508-2, may go after [facility] calls. Pt must be medically cleared prior to going to [facility] Accepting provider is Dr Jake Samples, Attending provider is Dr Annett Fabian Medical/Surgery History Past Medical History:  Diagnosis Date  . Depression   . Hypertension   . Suicidal overdose [facility])    Past Surgical History:  Procedure Laterality Date  . CHOLECYSTECTOMY    . TUBAL LIGATION       A IV Location/Drains/Wounds Patient Lines/Drains/Airways Status   Active Line/Drains/Airways    None          Intake/Output Last 24 hours  Intake/Output Summary (Last 24 hours) at 11/19/2018 0043 Last data filed at 11/18/2018 2000 Gross per 24 hour  Intake 240 ml  Output -  Net 240 ml    Labs/Imaging Results for orders placed or performed during the hospital encounter of 11/18/18 (from the past 48 hour(s))  Urine rapid drug screen (hosp performed)     Status: Abnormal   Collection Time: 11/18/18  2:45 PM  Result Value Ref Range   Opiates NONE DETECTED NONE DETECTED   Cocaine NONE DETECTED NONE DETECTED   Benzodiazepines NONE DETECTED NONE DETECTED   Amphetamines NONE DETECTED NONE DETECTED   Tetrahydrocannabinol POSITIVE (A) NONE DETECTED   Barbiturates NONE DETECTED NONE DETECTED    Comment: (NOTE) DRUG SCREEN FOR MEDICAL PURPOSES ONLY.  IF CONFIRMATION IS NEEDED FOR ANY PURPOSE, NOTIFY LAB WITHIN 5 DAYS. LOWEST DETECTABLE LIMITS FOR URINE DRUG SCREEN Drug Class                     Cutoff (ng/mL) Amphetamine and metabolites    1000 Barbiturate and metabolites    200 Benzodiazepine                 353 Tricyclics and metabolites     300 Opiates and metabolites        300 Cocaine and metabolites        300 THC                            50 Performed at [facility], [place] [address], [place], [place] [address]   Comprehensive metabolic panel     Status: Abnormal   Collection Time: 11/18/18  4:00 PM  Result Value Ref Range   Sodium 139 135 - 145 mmol/L   Potassium 2.9 (L) 3.5 - 5.1 mmol/L   Chloride 101 98 - 111 mmol/L   CO2 27 22 - 32  mmol/L   Glucose, Bld 88 70 - 99 mg/dL   BUN 10 6 - 20 mg/dL   Creatinine, Ser 0.91 0.44 - 1.00 mg/dL   Calcium 9.3 8.9 - 10.3 mg/dL   Total Protein 7.5 6.5 - 8.1 g/dL   Albumin 4.2 3.5 - 5.0 g/dL   AST 32 15 - 41 U/L   ALT 50 (H) 0 - 44 U/L   Alkaline Phosphatase 61 38 - 126 U/L   Total Bilirubin 0.9 0.3 - 1.2 mg/dL   GFR calc non Af Amer >60 >60 mL/min   GFR calc Af Amer >60 >60 mL/min   Anion gap 11 5 - 15    Comment: Performed at [facility], [place] [address] [place], [place] [address]  Ethanol     Status: None   Collection Time: 11/18/18  4:00 PM  Result Value Ref Range   Alcohol, Ethyl (B) <10 <10 mg/dL    Comment: (NOTE) Lowest detectable limit for serum alcohol is 10 mg/dL. For medical purposes only. Performed at [facility], [place] [address], [place], [place] [address]   CBC with Diff     Status: Abnormal   Collection Time: 11/18/18  4:00 PM  Result Value Ref Range   WBC 15.0 (H) 4.0 - 10.5 K/uL   RBC 5.23 (H) 3.87  - 5.11 MIL/uL   Hemoglobin 15.5 (H) 12.0 - 15.0 g/dL   HCT 47.9 (H) 36.0 - 46.0 %   MCV 91.6 80.0 - 100.0 fL   MCH 29.6 26.0 - 34.0 pg   MCHC 32.4 30.0 - 36.0 g/dL   RDW 14.9 11.5 - 15.5 %   Platelets 564 (H) 150 - 400 K/uL   nRBC 0.0 0.0 - 0.2 %   Neutrophils Relative % 66 %   Neutro Abs 9.9 (H) 1.7 - 7.7 K/uL   Lymphocytes Relative 23 %   Lymphs Abs 3.5 0.7 - 4.0 K/uL   Monocytes Relative 8 %   Monocytes Absolute 1.1 (H) 0.1 - 1.0 K/uL   Eosinophils Relative 2 %   Eosinophils Absolute 0.2 0.0 - 0.5 K/uL   Basophils Relative 1 %   Basophils Absolute 0.1 0.0 - 0.1 K/uL   Immature Granulocytes 0 %   Abs Immature Granulocytes 0.06 0.00 - 0.07 K/uL    Comment: Performed at [facility], [place] [address], [place], [place] [address]  Urinalysis, Routine w reflex microscopic     Status: Abnormal   Collection Time: 11/18/18  5:27 PM  Result Value Ref Range   Color, Urine STRAW (A) YELLOW   APPearance CLEAR CLEAR   Specific Gravity, Urine 1.003 (L) 1.005 - 1.030   pH 6.0 5.0 - 8.0   Glucose, UA NEGATIVE NEGATIVE mg/dL   Hgb urine dipstick LARGE (A) NEGATIVE   Bilirubin Urine NEGATIVE NEGATIVE   Ketones, ur NEGATIVE NEGATIVE mg/dL   Protein, ur NEGATIVE NEGATIVE mg/dL   Nitrite NEGATIVE NEGATIVE   Leukocytes,Ua NEGATIVE NEGATIVE   RBC / HPF 0-5 0 - 5 RBC/hpf   WBC, UA 0-5 0 - 5 WBC/hpf   Bacteria, UA RARE (A) NONE SEEN   Squamous Epithelial / LPF 0-5 0 - 5    Comment: Performed at [facility], [place] [address] [place], [place] [address]   Dg Chest Port 1 View  Result Date: 11/18/2018 CLINICAL DATA:  Cough and leukocytosis. EXAM: PORTABLE CHEST 1 VIEW COMPARISON:  None. FINDINGS: The heart size and mediastinal contours are within normal  limits. Both lungs are clear. The visualized skeletal structures are unremarkable. IMPRESSION: Normal examination. Electronically Signed   By: Claudie Revering M.D.   On: 11/18/2018 18:08    Pending Labs [employer] (From admission, onward)    Start     Ordered   Signed and Held  Lipid panel  [place] Morning draw,   R     Signed and Held   Signed and Held  TSH  [facility] Morning draw,   R     Signed and Held   Signed and Held  Hemoglobin A1c  [facility] Morning draw,   R     Signed and Held          Vitals/Pain Today's Vitals   11/18/18 1424 11/18/18 1759 11/18/18 2236 11/19/18 0027  BP:  114/83 118/76 109/73  Pulse:  79 76 79  Resp:  20 18 17   Temp:  98.6 F (37 C) (!) 97.5 F (36.4 C) 98.2 F (36.8 C)  TempSrc:  Oral Oral Oral  SpO2:  98% 99%   PainSc: 7        Isolation Precautions No active isolations  Medications Medications  ondansetron (ZOFRAN) tablet 4 mg (has no administration in time range)  aspirin EC tablet 81 mg (81 mg Oral Not Given 11/18/18 1648)  atorvastatin (LIPITOR) tablet 20 mg (20 mg Oral Not Given 11/18/18 1648)  hydrochlorothiazide (HYDRODIURIL) tablet 25 mg (25 mg Oral Not Given 11/18/18 1813)  hydrOXYzine (ATARAX/VISTARIL) tablet 25 mg (has no administration in time range)  venlafaxine XR (EFFEXOR-XR) 24 hr capsule 37.5 mg (has no administration in time range)  mirtazapine (REMERON) tablet 15 mg (15 mg Oral Given 11/18/18 2256)  potassium chloride SA (K-DUR,KLOR-CON) CR tablet 40 mEq (40 mEq Oral Given 11/18/18 1812)  acetaminophen (TYLENOL) tablet 650 mg (650 mg Oral Given 11/18/18 1903)  potassium chloride SA (K-DUR,KLOR-CON) CR tablet 40 mEq (40 mEq Oral Given 11/18/18 1944)    Mobility walks High fall risk   Focused Assessments ED psychiatric   R Recommendations: See Admitting Provider Note  Report given to:   Additional Notes: none

## 2018-11-19 NOTE — Tx Team (Signed)
Initial Treatment Plan 11/19/2018 4:23 AM Alicia Robinson RMB:[id number]    PATIENT STRESSORS: Financial difficulties Marital or family conflict Occupational concerns   PATIENT STRENGTHS: Ability for insight Active sense of humor Average or above average intelligence   PATIENT IDENTIFIED PROBLEMS: "anxiety"  "coping skills for depression"                   DISCHARGE CRITERIA:  Ability to meet basic life and health needs Adequate post-discharge living arrangements Improved stabilization in mood, thinking, and/or behavior Medical problems require only outpatient monitoring  PRELIMINARY DISCHARGE PLAN: Attend aftercare/continuing care group Attend PHP/IOP Attend 12-step recovery group  PATIENT/FAMILY INVOLVEMENT: This treatment plan has been presented to and reviewed with the patient, Alicia Robinson.  The patient and family have been given the opportunity to ask questions and make suggestions.  Gwendolyn Fill, RN 11/19/2018, 4:23 AM

## 2018-11-19 NOTE — ED Notes (Signed)
Transported to [facility] via [facility] transport conditon stable

## 2018-11-20 MED ORDER — PERPHENAZINE 2 MG PO TABS
2.0000 mg | ORAL_TABLET | Freq: Every day | ORAL | Status: DC
  Administered 2018-11-21 – 2018-11-23 (×3): 2 mg via ORAL
  Filled 2018-11-20 (×5): qty 1

## 2018-11-20 MED ORDER — PERPHENAZINE 4 MG PO TABS
4.0000 mg | ORAL_TABLET | Freq: Every day | ORAL | Status: DC
  Administered 2018-11-20 – 2018-11-21 (×2): 4 mg via ORAL
  Filled 2018-11-20 (×6): qty 1

## 2018-11-20 MED ORDER — VENLAFAXINE HCL ER 150 MG PO CP24
150.0000 mg | ORAL_CAPSULE | Freq: Every day | ORAL | Status: DC
  Filled 2018-11-20 (×3): qty 1

## 2018-11-20 NOTE — Progress Notes (Signed)
Adult Psychoeducational Group Note  Date:  11/20/2018 Time:  8:41 PM  Group Topic/Focus:  Wrap-Up Group:   The focus of this group is to help patients review their daily goal of treatment and discuss progress on daily workbooks.  Participation Level:  Active  Participation Quality:  Appropriate  Affect:  Appropriate  Cognitive:  Alert  Insight: Appropriate  Engagement in Group:  Engaged  Modes of Intervention:  Discussion  Additional Comments:  Pt stated that today has been a good day. Pt states that she hopes to have a medications adjusted. Pt stated that she has found the group to be helpful for her.   Alicia Robinson 11/20/2018, 8:41 PM

## 2018-11-20 NOTE — Progress Notes (Signed)
CSW spoke with pt regarding discharge plan.  Informed pt that [facility] is not accepting new therapy clients.  Limited other options in [place]--discussed [facility] and pt agreeable to this plan.   Winferd Humphrey, MSW, LCSW Clinical Social Worker 11/20/2018 11:39 AM

## 2018-11-20 NOTE — [facility] Suicide Risk Assessment (Addendum)
[place] INPATIENT:  Family/Significant Other Suicide Prevention Education  Suicide Prevention Education:  Contact Attempts: Lenette Rau, daughter, [phone], has been identified by the patient as the family member/significant other with whom the patient will be residing, and identified as the person(s) who will aid the patient in the event of a mental health crisis.  With written consent from the patient, two attempts were made to provide suicide prevention education, prior to and/or following the patient's discharge.  We were unsuccessful in providing suicide prevention education.  A suicide education pamphlet was given to the patient to share with family/significant other.  Date and time of first attempt:11/20/18, 37 Date and time of second attempt: 11/20/18, 1445  Joanne Chars, LCSW 11/20/2018, 10:56 AM

## 2018-11-20 NOTE — Progress Notes (Signed)
[facility] MD Progress Note  11/20/2018 10:07 AM Alicia Robinson  MRN:  [id number] Subjective:    Patient seen she reports no further visual hallucinations but reports continued depression would like to escalate her antidepressant or start a new when she is alert oriented to person place situation again reports resolution in the visual hallucination no auditory hallucinations no EPS or TD.  Mood is improved but states she is still from depression  No EPS or TDPrincipal Problem: <principal problem not specified> Diagnosis: Active Problems:   MDD (major depressive disorder), recurrent, severe, with psychosis ([place])  Total Time spent with patient: 20 minutes  Past Medical History:  Past Medical History:  Diagnosis Date  . Depression   . Hypertension   . Suicidal overdose [facility])     Past Surgical History:  Procedure Laterality Date  . CHOLECYSTECTOMY    . TUBAL LIGATION     Family History: History reviewed. No pertinent family history. Family Psychiatric  History: no change Social History:  Social History   Substance and Sexual Activity  Alcohol Use Yes   Comment: weekends     Social History   Substance and Sexual Activity  Drug Use Yes  . Types: Marijuana    Social History   Socioeconomic History  . Marital status: Single    Spouse name: Not on file  . Number of children: Not on file  . Years of education: Not on file  . Highest education level: Not on file  Occupational History  . Not on file  Social Needs  . Financial resource strain: Not on file  . Food insecurity:    Worry: Not on file    Inability: Not on file  . Transportation needs:    Medical: Not on file    Non-medical: Not on file  Tobacco Use  . Smoking status: Never Smoker  . Smokeless tobacco: Never Used  Substance and Sexual Activity  . Alcohol use: Yes    Comment: weekends  . Drug use: Yes    Types: Marijuana  . Sexual activity: Yes  Lifestyle  . Physical activity:    Days per week: Not on file     Minutes per session: Not on file  . Stress: Not on file  Relationships  . Social connections:    Talks on phone: Not on file    Gets together: Not on file    Attends religious service: Not on file    Active member of club or organization: Not on file    Attends meetings of clubs or organizations: Not on file    Relationship status: Not on file  Other Topics Concern  . Not on file  Social History Narrative  . Not on file   Additional Social History:                         Sleep: Good  Appetite:  Good  Current Medications: Current Facility-Administered Medications  Medication Dose Route Frequency Provider Last Rate Last Dose  . acetaminophen (TYLENOL) tablet 650 mg  650 mg Oral Q6H PRN Ethelene Hal, NP      . alum & mag hydroxide-simeth (MAALOX/MYLANTA) 200-200-20 MG/5ML suspension 30 mL  30 mL Oral Q4H PRN Ethelene Hal, NP      . aspirin EC tablet 81 mg  81 mg Oral Daily Ethelene Hal, NP   81 mg at 11/20/18 0820  . atorvastatin (LIPITOR) tablet 20 mg  20 mg Oral Daily  Ethelene Hal, NP   20 mg at 11/20/18 9833  . hydrochlorothiazide (HYDRODIURIL) tablet 25 mg  25 mg Oral Daily Ethelene Hal, NP   25 mg at 11/20/18 8250  . hydrOXYzine (ATARAX/VISTARIL) tablet 25 mg  25 mg Oral TID PRN Ethelene Hal, NP      . magnesium hydroxide (MILK OF MAGNESIA) suspension 30 mL  30 mL Oral Daily PRN Ethelene Hal, NP      . mirtazapine (REMERON) tablet 15 mg  15 mg Oral QHS Ethelene Hal, NP   15 mg at 11/19/18 2132  . ondansetron (ZOFRAN) tablet 4 mg  4 mg Oral Q8H PRN Ethelene Hal, NP      . Derrill Memo ON 11/21/2018] perphenazine (TRILAFON) tablet 2 mg  2 mg Oral QPC breakfast Johnn Hai, MD      . perphenazine (TRILAFON) tablet 4 mg  4 mg Oral QHS Johnn Hai, MD      . Derrill Memo ON 11/21/2018] venlafaxine XR (EFFEXOR-XR) 24 hr capsule 150 mg  150 mg Oral Q breakfast Johnn Hai, MD      . zolpidem Lorrin Mais) tablet  10 mg  10 mg Oral QHS PRN Johnn Hai, MD        Lab Results:  Results for orders placed or performed during the hospital encounter of 11/19/18 (from the past 48 hour(s))  Hemoglobin A1c     Status: None   Collection Time: 11/19/18  6:33 AM  Result Value Ref Range   Hgb A1c MFr Bld 5.4 4.8 - 5.6 %    Comment: (NOTE) Pre diabetes:          5.7%-6.4% Diabetes:              >6.4% Glycemic control for   <7.0% adults with diabetes    Mean Plasma Glucose 108.28 mg/dL    Comment: Performed at [facility], [place] [address], [place], [place] [address]  Lipid panel     Status: None   Collection Time: 11/19/18  6:33 AM  Result Value Ref Range   Cholesterol 134 0 - 200 mg/dL   Triglycerides 81 <150 mg/dL   HDL 41 >40 mg/dL   Total CHOL/HDL Ratio 3.3 RATIO   VLDL 16 0 - 40 mg/dL   LDL Cholesterol 77 0 - 99 mg/dL    Comment:        Total Cholesterol/HDL:CHD Risk Coronary Heart Disease Risk Table                     Men   Women  1/2 Average Risk   3.4   3.3  Average Risk       5.0   4.4  2 X Average Risk   9.6   7.1  3 X Average Risk  23.4   11.0        Use the calculated Patient Ratio above and the CHD Risk Table to determine the patient's CHD Risk.        ATP III CLASSIFICATION (LDL):  <100     mg/dL   Optimal  100-129  mg/dL   Near or Above                    Optimal  130-159  mg/dL   Borderline  160-189  mg/dL   High  >190     mg/dL   Very High Performed at [place] [address] [place], [place] [address]  TSH     Status: None   Collection Time: 11/19/18  6:33 AM  Result Value Ref Range   TSH 1.578 0.350 - 4.500 uIU/mL    Comment: Performed by a 3rd Generation assay with a functional sensitivity of <=0.01 uIU/mL. Performed at [facility], [place] [address] [place], [place] [address]     Blood Alcohol level:  Lab Results  Component Value Date   ETH <10 11/18/2018   ETH <10 01/60/1093    Metabolic Disorder  Labs: Lab Results  Component Value Date   HGBA1C 5.4 11/19/2018   MPG 108.28 11/19/2018   MPG 105.41 09/22/2017   No results found for: PROLACTIN Lab Results  Component Value Date   CHOL 134 11/19/2018   TRIG 81 11/19/2018   HDL 41 11/19/2018   CHOLHDL 3.3 11/19/2018   VLDL 16 11/19/2018   LDLCALC 77 11/19/2018   LDLCALC 189 (H) 09/22/2017    Physical Findings: AIMS:  , ,  ,  ,    CIWA:    COWS:     Musculoskeletal: Strength & Muscle Tone: within normal limits Gait & Station: normal Patient leans: N/A  Psychiatric Specialty Exam: Physical Exam  ROS  Blood pressure 112/80, pulse 97, temperature 99.6 F (37.6 C), temperature source Oral, last menstrual period 11/18/2018.There is no height or weight on file to calculate BMI.  General Appearance: Casual  Eye Contact:  Good  Speech:  Clear and Coherent  Volume:  Decreased  Mood:  Dysphoric  Affect:  Congruent  Thought Process:  Coherent  Orientation:  Full (Time, Place, and Person)  Thought Content:  Tangential  Suicidal Thoughts:  No  Homicidal Thoughts:  No  Memory:  Immediate;   Fair  Judgement:  Fair  Insight:  Fair  Psychomotor Activity:  Decreased  Concentration:  Concentration: Fair  Recall:  [employer] of Knowledge:  Fair  Language:  Fair  Akathisia:  Negative  Handed:  Right  AIMS (if indicated):     Assets:  Physical Health Resilience Social Support  ADL's:  Intact  Cognition:  WNL  Sleep:  Number of Hours: 6.75     Treatment Plan Summary: Daily contact with patient to assess and evaluate symptoms and progress in treatment, Medication management and Plan No change in current precautions but escalate antipsychotic at bedtime escalate venlafaxine continue cognitive therapy probable discharge Monday  Masako Overall, MD 11/20/2018, 10:07 AM

## 2018-11-20 NOTE — Progress Notes (Signed)
Pt attended wrap-up group tonight. Pt appears anxious/flat in affect and mood. Pt denies SI/HI/AVH/Pain at this time. Pt requested print-out of bedtime meds which was given to her. Pt refused Remeron at bedtime stating "It gives me a lot of side effects; especially nausea and tremors". Pt was encourage to speak with provider. Pt stated she rest for the majority of the day. PRN Ambien offered and accepted.Trilafon was increase to 4 mg tonight; Pt was aware. Support provided. Will continue with POC.

## 2018-11-20 NOTE — Progress Notes (Signed)
DAR NOTE: Patient presents with flat affect and depressed mood.  Denies suicidal thoughts, pain, auditory and visual hallucinations.  Rates depression at 8, hopelessness at 8, and anxiety at 5.  Maintained on routine safety checks.  Medications given as prescribed.  Support and encouragement offered as needed.  Attended group and participated.  States goal for today is "concentration and getting well."  Patient observed socializing with peers in the dayroom.  Offered no complaint.

## 2018-11-20 NOTE — Tx Team (Signed)
Interdisciplinary Treatment and Diagnostic Plan Update  11/20/2018 Time of Session: 11/20/2018 9:30 AM Alicia Robinson MRN: [id number]  Principal Diagnosis: <principal problem not specified>  Secondary Diagnoses: Active Problems:   MDD (major depressive disorder), recurrent, severe, with psychosis ([place])   Current Medications:  Current Facility-Administered Medications  Medication Dose Route Frequency Provider Last Rate Last Dose   acetaminophen (TYLENOL) tablet 650 mg  650 mg Oral Q6H PRN Ethelene Hal, NP       alum & mag hydroxide-simeth (MAALOX/MYLANTA) 200-200-20 MG/5ML suspension 30 mL  30 mL Oral Q4H PRN Ethelene Hal, NP       aspirin EC tablet 81 mg  81 mg Oral Daily Ethelene Hal, NP   81 mg at 11/20/18 0820   atorvastatin (LIPITOR) tablet 20 mg  20 mg Oral Daily Ethelene Hal, NP   20 mg at 11/20/18 9528   hydrochlorothiazide (HYDRODIURIL) tablet 25 mg  25 mg Oral Daily Ethelene Hal, NP   25 mg at 11/20/18 4132   hydrOXYzine (ATARAX/VISTARIL) tablet 25 mg  25 mg Oral TID PRN Ethelene Hal, NP       magnesium hydroxide (MILK OF MAGNESIA) suspension 30 mL  30 mL Oral Daily PRN Ethelene Hal, NP       mirtazapine (REMERON) tablet 15 mg  15 mg Oral QHS Ethelene Hal, NP   15 mg at 11/19/18 2132   ondansetron (ZOFRAN) tablet 4 mg  4 mg Oral Q8H PRN Ethelene Hal, NP       [START ON 11/21/2018] perphenazine (TRILAFON) tablet 2 mg  2 mg Oral QPC breakfast Johnn Hai, MD       perphenazine (TRILAFON) tablet 4 mg  4 mg Oral QHS Johnn Hai, MD       [START ON 11/21/2018] venlafaxine XR (EFFEXOR-XR) 24 hr capsule 150 mg  150 mg Oral Q breakfast Johnn Hai, MD       zolpidem Lorrin Mais) tablet 10 mg  10 mg Oral QHS PRN Johnn Hai, MD       PTA Medications: Medications Prior to Admission  Medication Sig Dispense Refill Last Dose   Ascorbic Acid (VITAMIN C) 100 MG tablet Take 100 mg by mouth daily.    11/17/2018 at Unknown time   aspirin EC 81 MG tablet Take 1 tablet (81 mg total) by mouth daily. For heart health   11/18/2018 at Unknown time   atorvastatin (LIPITOR) 20 MG tablet Take 1 tablet (20 mg total) by mouth daily. For high cholesterol   11/18/2018 at Unknown time   cholecalciferol (VITAMIN D3) 25 MCG (1000 UT) tablet Take 1,000 Units by mouth daily.   11/17/2018 at Unknown time   fluconazole (DIFLUCAN) 150 MG tablet Take 1 tablet (150 mg total) by mouth daily. (For 3 more days): For yeast infection (Patient not taking: Reported on 11/18/2018) 3 tablet 0 Completed Course at Unknown time   GARLIC PO Take 1 capsule by mouth daily.   11/17/2018 at Unknown time   hydrochlorothiazide (HYDRODIURIL) 25 MG tablet Take 1 tablet (25 mg total) by mouth daily. For high blood pressure 30 tablet 0 11/18/2018 at Unknown time   Multiple Vitamins-Minerals (HAIR SKIN AND NAILS FORMULA PO) Take 1 tablet by mouth daily.   11/17/2018 at Unknown time   Multiple Vitamins-Minerals (MULTIVITAMIN ADULT PO) Take 1 tablet by mouth.   11/17/2018 at Unknown time   Omega-3 Fatty Acids (FISH OIL) 1000 MG CAPS Take 1 capsule by mouth daily.  11/17/2018 at Unknown time   omeprazole (PRILOSEC) 20 MG capsule Take 20 mg by mouth 2 (two) times daily before a meal.    11/18/2018 at Unknown time   thiamine (VITAMIN B-1) 100 MG tablet Take 100 mg by mouth daily.   11/17/2018 at Unknown time    Patient Stressors: Financial difficulties Marital or family conflict Occupational concerns  Patient Strengths: Ability for insight Active sense of humor Average or above average intelligence  Treatment Modalities: Medication Management, Group therapy, Case management,  1 to 1 session with clinician, Psychoeducation, Recreational therapy.   Physician Treatment Plan for Primary Diagnosis: <principal problem not specified> Long Term Goal(s): Improvement in symptoms so as ready for discharge Improvement in symptoms so as ready for discharge    Short Term Goals: Ability to demonstrate self-control will improve Compliance with prescribed medications will improve  Medication Management: Evaluate patient's response, side effects, and tolerance of medication regimen.  Therapeutic Interventions: 1 to 1 sessions, Unit Group sessions and Medication administration.  Evaluation of Outcomes: Progressing  Physician Treatment Plan for Secondary Diagnosis: Active Problems:   MDD (major depressive disorder), recurrent, severe, with psychosis ([place])  Long Term Goal(s): Improvement in symptoms so as ready for discharge Improvement in symptoms so as ready for discharge   Short Term Goals: Ability to demonstrate self-control will improve Compliance with prescribed medications will improve     Medication Management: Evaluate patient's response, side effects, and tolerance of medication regimen.  Therapeutic Interventions: 1 to 1 sessions, Unit Group sessions and Medication administration.  Evaluation of Outcomes: Progressing   RN Treatment Plan for Primary Diagnosis: <principal problem not specified> Long Term Goal(s): Knowledge of disease and therapeutic regimen to maintain health will improve  Short Term Goals: Ability to remain free from injury will improve and Compliance with prescribed medications will improve  Medication Management: RN will administer medications as ordered by provider, will assess and evaluate patient's response and provide education to patient for prescribed medication. RN will report any adverse and/or side effects to prescribing provider.  Therapeutic Interventions: 1 on 1 counseling sessions, Psychoeducation, Medication administration, Evaluate responses to treatment, Monitor vital signs and CBGs as ordered, Perform/monitor CIWA, COWS, AIMS and Fall Risk screenings as ordered, Perform wound care treatments as ordered.  Evaluation of Outcomes: Progressing   LCSW Treatment Plan for Primary Diagnosis:  <principal problem not specified> Long Term Goal(s): Safe transition to appropriate next level of care at discharge, Engage patient in therapeutic group addressing interpersonal concerns.  Short Term Goals: Engage patient in aftercare planning with referrals and resources, Increase social support and Increase skills for wellness and recovery  Therapeutic Interventions: Assess for all discharge needs, 1 to 1 time with Social worker, Explore available resources and support systems, Assess for adequacy in community support network, Educate family and significant other(s) on suicide prevention, Complete Psychosocial Assessment, Interpersonal group therapy.  Evaluation of Outcomes: Progressing   Progress in Treatment: Attending groups: Yes. Participating in groups: Yes. Taking medication as prescribed: Yes. Toleration medication: Yes. Family/Significant other contact made: No, will contact:  Arrielle Mcginn, daughter [phone] Patient understands diagnosis: Yes. Discussing patient identified problems/goals with staff: Yes. Medical problems stabilized or resolved: No. Denies suicidal/homicidal ideation: Yes. Issues/concerns per patient self-inventory: No. Other: None reported   New problem(s) identified: No, Describe:  None reported  New Short Term/Long Term Goal(s):  Patient Goals:    Discharge Plan or Barriers:   Reason for Continuation of Hospitalization: Anxiety Medication stabilization  Estimated Length of Stay: 3-5 days  Attendees: Patient: Alicia Robinson 11/20/2018 12:21 PM  Physician: Johnn Hai, MD 11/20/2018 12:21 PM  Nursing: Sena Hitch, RN 11/20/2018 12:21 PM  RN Care Manager: 11/20/2018 12:21 PM  Social Worker: Rockwell Germany, LCSW 11/20/2018 12:21 PM  Recreational Therapist:  11/20/2018 12:21 PM  Other: Lawana Pai, MSW Intern 11/20/2018 12:21 PM  Other:  11/20/2018 12:21 PM  Other: 11/20/2018 12:21 PM    Scribe for Treatment Team: Lawana Pai, MSW  Intern 11/20/2018 12:21 PM

## 2018-11-21 NOTE — B[facility] Counselor (Signed)
Clinical Social Work Note  At pt request, with written consent, CSW sent a letter to pt's supervisor about hospitalization.  Selmer Dominion, LCSW 11/21/2018, 5:03 PM

## 2018-11-21 NOTE — Progress Notes (Signed)
Pt actively participated in group activity for wrap-up group.

## 2018-11-21 NOTE — Progress Notes (Signed)
Alicia Robinson attended wrap-up group tonight. Pt appears anxious/flat in affect and mood. Pt denies SI/HI/AVH/Pain at this time. Pt continues to refuse Remeron and Effexor due to side effects. Pt states she try Wellbutrin in the past and stated that it was effective. Pt was encourage to speak with provider tomorrow. Support provided. Will continue with POC.

## 2018-11-21 NOTE — Progress Notes (Signed)
[facility] MD Progress Note  11/21/2018 12:20 PM Alicia Robinson  MRN:  [id number] Subjective: Patient is a 43 year old female with a past psychiatric history significant for depression and psychosis.  She presented to the [facility] emergency department on 11/18/2018 with a friend.  The patient was initially a verbal on admission.  Her initial complaint was "I am confused, my head hurts".  She carried a diagnosis of cannabis dependency and depression with recurrent psychosis in the past.  Objective: Patient is seen and examined.  Patient is a 43 year old female with the above-stated past psychiatric history seen in follow-up.  Patient was essentially mute on admission.  She did admit to her admitting physician that she was having some visual hallucinations.  She denied any hallucinations today.  She is adamant that she has side effects to mirtazapine as well as venlafaxine.  She stated that she is depressed, and overworked.  She states she works all these jobs.  It is too much for her.  She stated that after her last hospitalization she did well for a couple of months, but she did not work during that time period.  She is a bit confused this morning because initially she stated she did not get the Effexor this morning, and then she stated that she has side effects to it.  She stated that she gets tingling in her fingers and feet.  She stated that the mirtazapine does this as well.  She also denied any suicidal ideation.  Her vital signs are stable, she is a low-grade temperature at 99.6.  She slept 6.75 hours last night.  Principal Problem: <principal problem not specified> Diagnosis: Active Problems:   MDD (major depressive disorder), recurrent, severe, with psychosis ([place])  Total Time spent with patient: 20 minutes  Past Psychiatric History: See admission H&P  Past Medical History:  Past Medical History:  Diagnosis Date  . Depression   . Hypertension   . Suicidal overdose [facility])      Past Surgical History:  Procedure Laterality Date  . CHOLECYSTECTOMY    . TUBAL LIGATION     Family History: History reviewed. No pertinent family history. Family Psychiatric  History: See admission H&P Social History:  Social History   Substance and Sexual Activity  Alcohol Use Yes   Comment: weekends     Social History   Substance and Sexual Activity  Drug Use Yes  . Types: Marijuana    Social History   Socioeconomic History  . Marital status: Single    Spouse name: Not on file  . Number of children: Not on file  . Years of education: Not on file  . Highest education level: Not on file  Occupational History  . Not on file  Social Needs  . Financial resource strain: Not on file  . Food insecurity:    Worry: Not on file    Inability: Not on file  . Transportation needs:    Medical: Not on file    Non-medical: Not on file  Tobacco Use  . Smoking status: Never Smoker  . Smokeless tobacco: Never Used  Substance and Sexual Activity  . Alcohol use: Yes    Comment: weekends  . Drug use: Yes    Types: Marijuana  . Sexual activity: Yes  Lifestyle  . Physical activity:    Days per week: Not on file    Minutes per session: Not on file  . Stress: Not on file  Relationships  . Social connections:  Talks on phone: Not on file    Gets together: Not on file    Attends religious service: Not on file    Active member of club or organization: Not on file    Attends meetings of clubs or organizations: Not on file    Relationship status: Not on file  Other Topics Concern  . Not on file  Social History Narrative  . Not on file   Additional Social History:                         Sleep: Good  Appetite:  Fair  Current Medications: Current Facility-Administered Medications  Medication Dose Route Frequency Provider Last Rate Last Dose  . acetaminophen (TYLENOL) tablet 650 mg  650 mg Oral Q6H PRN Ethelene Hal, NP      . alum & mag  hydroxide-simeth (MAALOX/MYLANTA) 200-200-20 MG/5ML suspension 30 mL  30 mL Oral Q4H PRN Ethelene Hal, NP      . aspirin EC tablet 81 mg  81 mg Oral Daily Ethelene Hal, NP   81 mg at 11/21/18 4481  . atorvastatin (LIPITOR) tablet 20 mg  20 mg Oral Daily Ethelene Hal, NP   20 mg at 11/21/18 0815  . hydrochlorothiazide (HYDRODIURIL) tablet 25 mg  25 mg Oral Daily Ethelene Hal, NP   25 mg at 11/21/18 0815  . hydrOXYzine (ATARAX/VISTARIL) tablet 25 mg  25 mg Oral TID PRN Ethelene Hal, NP      . magnesium hydroxide (MILK OF MAGNESIA) suspension 30 mL  30 mL Oral Daily PRN Ethelene Hal, NP      . mirtazapine (REMERON) tablet 15 mg  15 mg Oral QHS Ethelene Hal, NP   15 mg at 11/19/18 2132  . ondansetron (ZOFRAN) tablet 4 mg  4 mg Oral Q8H PRN Ethelene Hal, NP      . perphenazine (TRILAFON) tablet 2 mg  2 mg Oral QPC breakfast Johnn Hai, MD   2 mg at 11/21/18 0815  . perphenazine (TRILAFON) tablet 4 mg  4 mg Oral QHS Johnn Hai, MD   4 mg at 11/20/18 2100  . venlafaxine XR (EFFEXOR-XR) 24 hr capsule 150 mg  150 mg Oral Q breakfast Johnn Hai, MD      . zolpidem Lorrin Mais) tablet 10 mg  10 mg Oral QHS PRN Johnn Hai, MD   10 mg at 11/20/18 2100    Lab Results: No results found for this or any previous visit (from the past 48 hour(s)).  Blood Alcohol level:  Lab Results  Component Value Date   ETH <10 11/18/2018   ETH <10 85/63/1497    Metabolic Disorder Labs: Lab Results  Component Value Date   HGBA1C 5.4 11/19/2018   MPG 108.28 11/19/2018   MPG 105.41 09/22/2017   No results found for: PROLACTIN Lab Results  Component Value Date   CHOL 134 11/19/2018   TRIG 81 11/19/2018   HDL 41 11/19/2018   CHOLHDL 3.3 11/19/2018   VLDL 16 11/19/2018   LDLCALC 77 11/19/2018   LDLCALC 189 (H) 09/22/2017    Physical Findings: AIMS:  , ,  ,  ,    CIWA:    COWS:     Musculoskeletal: Strength & Muscle Tone: within  normal limits Gait & Station: normal Patient leans: N/A  Psychiatric Specialty Exam: Physical Exam  Nursing note and vitals reviewed. Constitutional: She is oriented to person, place, and time. She  appears well-developed and well-nourished.  HENT:  Head: Normocephalic and atraumatic.  Respiratory: Effort normal.  Neurological: She is alert and oriented to person, place, and time.    ROS  Blood pressure 112/80, pulse 97, temperature 99.6 F (37.6 C), temperature source Oral, last menstrual period 11/18/2018.There is no height or weight on file to calculate BMI.  General Appearance: Casual  Eye Contact:  Fair  Speech:  Normal Rate  Volume:  Normal  Mood:  Anxious  Affect:  Congruent  Thought Process:  Coherent and Descriptions of Associations: Circumstantial  Orientation:  Full (Time, Place, and Person)  Thought Content:  Rumination  Suicidal Thoughts:  No  Homicidal Thoughts:  No  Memory:  Immediate;   Fair Recent;   Fair Remote;   Fair  Judgement:  Impaired  Insight:  Fair  Psychomotor Activity:  Increased  Concentration:  Concentration: Fair and Attention Span: Fair  Recall:  [employer] of Knowledge:  Fair  Language:  Fair  Akathisia:  Negative  Handed:  Right  AIMS (if indicated):     Assets:  Communication Skills Desire for Improvement Housing Physical Health Resilience  ADL's:  Intact  Cognition:  WNL  Sleep:  Number of Hours: 6.75     Treatment Plan Summary: Daily contact with patient to assess and evaluate symptoms and progress in treatment, Medication management and Plan : Patient is seen and examined.  Patient is a 42 year old female with the above-stated past psychiatric history who is seen in follow-up.  At least according to the note she seems to be improving.  She denied any suicidal ideation.  She denied any visual hallucinations.  She stated that mirtazapine and venlafaxine both lead to side effects, but she has been on both these medicines since  admission.  I am not going to stop them at this point.  We will see whether or not her confusion improves, and whether or not these are real side effects that she is having.  She was actually discharged on mirtazapine and venlafaxine on her last hospitalization.  We will just have to monitor this. 1.  Continue coated aspirin 81 mg p.o. daily for heart health and stroke health. 2.  Continue Lipitor 20 mg p.o. daily for hyperlipidemia. 3.  Continue hydrochlorothiazide 25 mg p.o. daily for hypertension. 4.  Continue hydroxyzine 25 mg p.o. 3 times daily as needed anxiety. 5.  Continue Remeron 15 mg p.o. nightly for mood, anxiety and sleep. 6.  Continue perphenazine 2 mg p.o. daily and 4 mg p.o. nightly for psychosis. 7.  Continue venlafaxine XR 150 mg p.o. daily for mood and anxiety. 8.  Continue Ambien 10 mg p.o. nightly as needed insomnia. 9.  Disposition planning-in progress. Sharma Covert, MD 11/21/2018, 12:20 PM

## 2018-11-21 NOTE — B[facility] Group Notes (Signed)
LCSW Group Therapy Note  11/21/2018    10:00-11:00am   Type of Therapy and Topic:  Group Therapy: Early Messages Received About Anger  Participation Level:  Active   Description of Group:   In this group, patients shared and discussed the early messages received in their lives about anger through parental or other adult modeling, teaching, repression, punishment, violence, and more.  Participants identified how those childhood lessons influence even now how they usually or often react when angered.  The group discussed that anger is a secondary emotion and what may be the underlying emotional themes that come out through anger outbursts or that are ignored through anger suppression.  Finally, as a group there was a conversation about the workbook's quote that "There is nothing wrong with anger; it is just a sign something needs to change."     Therapeutic Goals: 1. Patients will identify one or more childhood message about anger that they received and how it was taught to them. 2. Patients will discuss how these childhood experiences have influenced and continue to influence their own expression or repression of anger even today. 3. Patients will explore possible primary emotions that tend to fuel their secondary emotion of anger. 4. Patients will learn that anger itself is normal and cannot be eliminated, and that healthier coping skills can assist with resolving conflict rather than worsening situations.  Summary of Patient Progress:  The patient was only present for the last 15 minutes of group, but she participated fully during that time.  She expressed frustration about her medications and said she feels powerless.  The group talked her through several options available to her and she was open to them.  Therapeutic Modalities:   Cognitive Behavioral Therapy Motivation Interviewing  Maretta Los  .

## 2018-11-21 NOTE — B[facility] Group Notes (Signed)
[place] Group Notes:  (Nursing)  Date:  11/21/2018  Time:  1:15 PM Type of Therapy:  Nurse Education  Participation Level:  Active  Participation Quality:  Appropriate  Affect:  Appropriate  Cognitive:  Appropriate  Insight:  Appropriate  Engagement in Group:  Engaged  Modes of Intervention:  Education  Summary of Progress/Problems: Identifying Needs Life Skills Group  [name] 11/21/2018, 2:02 PM

## 2018-11-21 NOTE — Progress Notes (Addendum)
D. Pt presents with a sad affect and depressed mood, calm and cooperative behavior.Per pt's self inventory, pt rates her depression, hopelessness and anxiety a 10/10/8, respectively. Pt observed sitting in dayroom interacting appropriately with peers and staff.  Pt currently denies SI/HI and AV hallucinations A. Labs and vitals monitored. Pt compliant with medications. Pt supported emotionally and encouraged to express concerns and ask questions.   R. Pt remains safe with 15 minute checks. Will continue POC.

## 2018-11-22 MED ORDER — BUPROPION HCL ER (XL) 150 MG PO TB24
150.0000 mg | ORAL_TABLET | Freq: Every day | ORAL | Status: DC
  Administered 2018-11-22 – 2018-11-25 (×4): 150 mg via ORAL
  Filled 2018-11-22 (×6): qty 1

## 2018-11-22 NOTE — B[facility] Group Notes (Signed)
[place] LCSW Group Therapy Note  11/22/2018   10:00-11:00AM  Type of Therapy and Topic:  Group Therapy:  Unhealthy versus Healthy Supports, Which Am I?  Participation Level:  Active   Description of Group:  Patients in this group were introduced to the concept that additional supports including self-support are an essential part of recovery.  Initially a discussion was held about the differences between healthy versus unhealthy supports.  Patients were asked to share what unhealthy supports in their lives need to be addressed, as well as what additional healthy supports could be added for greater help in reaching their goals.   A song entitled "My Own Hero" was played and a group discussion ensued in which patients stated they could relate to the song and it inspired them to realize they have be willing to help themselves in order to succeed, because other people cannot achieve sobriety or stability for them.  We discussed adding a variety of healthy supports to address the various needs in patient lives, including becoming more self-supportive.  Therapeutic Goals: 1)  Highlight the differences between healthy and unhealthy supports 2)  Suggest the importance of being a part of one's own support system 2)  Discuss reasons people in one's life may eventually be unable to be continually supportive  3)  Identify the patient's current support system and   4)  elicit commitments to add healthy supports and to become more conscious of being self-supportive   Summary of Patient Progress:  The patient expressed that the unhealthy support which needs to be addressed includes her son, stating that she is going to kick him out of her home when she gets home from the hospital.  Healthy supports which could be added for increased stability and happiness include returning to [facility], which was helping her to be stable until she stopped going.  She said also that her family is supportive of her  whenever she is in the hospital, but when she leaves, all that goes away.  She acknowledged that she needs to support herself more positively as well..  Therapeutic Modalities:   Motivational Interviewing Activity  Maretta Los

## 2018-11-22 NOTE — Progress Notes (Signed)
Adult Psychoeducational Group Note  Date:  11/22/2018 Time:  8:53 PM  Group Topic/Focus:  Wrap-Up Group:   The focus of this group is to help patients review their daily goal of treatment and discuss progress on daily workbooks.  Participation Level:  Active  Participation Quality:  Appropriate  Affect:  Appropriate  Cognitive:  Alert  Insight: Appropriate  Engagement in Group:  Engaged  Modes of Intervention:  Discussion  Additional Comments:  Patient stated having a wonderful day. Patient's goal for today was to get meds straighten out. Patient met goal.  Damiano Stamper L Hadar Elgersma 11/22/2018, 8:53 PM

## 2018-11-22 NOTE — Progress Notes (Signed)
[facility] MD Progress Note  11/22/2018 11:18 AM Alicia Robinson  MRN:  [id number] Subjective:  Patient is a 43 year old female with a past psychiatric history significant for depression and psychosis.  She presented to the [facility] emergency department on 11/18/2018 with a friend.  The patient was initially a verbal on admission.  Her initial complaint was "I am confused, my head hurts".  She carried a diagnosis of cannabis dependency and depression with recurrent psychosis in the past.  Objective: Patient is seen and examined.  Patient is a 43 year old female with the above-stated past psychiatric history who is seen in follow-up.  She is more talkative today than yesterday.  She again refused the Remeron last night and refused the Effexor this morning.  She stated she had been previously treated successfully with Wellbutrin, and she wanted to go back to that.  She is more talkative.  She is not hiding under a blanket like she was yesterday.  She denied any auditory or visual hallucinations.  She denied any suicidal ideation.  Her blood pressure is 133/93 today, pulse is 99 she is afebrile.  She slept 5.75 hours last night.  Principal Problem: <principal problem not specified> Diagnosis: Active Problems:   MDD (major depressive disorder), recurrent, severe, with psychosis ([place])  Total Time spent with patient: 15 minutes  Past Psychiatric History:     Past Medical History:  Past Medical History:  Diagnosis Date  . Depression   . Hypertension   . Suicidal overdose [facility])     Past Surgical History:  Procedure Laterality Date  . CHOLECYSTECTOMY    . TUBAL LIGATION     Family History: History reviewed. No pertinent family history. Family Psychiatric  History: See admission H&P Social History:  Social History   Substance and Sexual Activity  Alcohol Use Yes   Comment: weekends     Social History   Substance and Sexual Activity  Drug Use Yes  . Types: Marijuana     Social History   Socioeconomic History  . Marital status: Single    Spouse name: Not on file  . Number of children: Not on file  . Years of education: Not on file  . Highest education level: Not on file  Occupational History  . Not on file  Social Needs  . Financial resource strain: Not on file  . Food insecurity:    Worry: Not on file    Inability: Not on file  . Transportation needs:    Medical: Not on file    Non-medical: Not on file  Tobacco Use  . Smoking status: Never Smoker  . Smokeless tobacco: Never Used  Substance and Sexual Activity  . Alcohol use: Yes    Comment: weekends  . Drug use: Yes    Types: Marijuana  . Sexual activity: Yes  Lifestyle  . Physical activity:    Days per week: Not on file    Minutes per session: Not on file  . Stress: Not on file  Relationships  . Social connections:    Talks on phone: Not on file    Gets together: Not on file    Attends religious service: Not on file    Active member of club or organization: Not on file    Attends meetings of clubs or organizations: Not on file    Relationship status: Not on file  Other Topics Concern  . Not on file  Social History Narrative  . Not on file   Additional  Social History:                         Sleep: Fair  Appetite:  Fair  Current Medications: Current Facility-Administered Medications  Medication Dose Route Frequency Provider Last Rate Last Dose  . acetaminophen (TYLENOL) tablet 650 mg  650 mg Oral Q6H PRN Ethelene Hal, NP      . alum & mag hydroxide-simeth (MAALOX/MYLANTA) 200-200-20 MG/5ML suspension 30 mL  30 mL Oral Q4H PRN Ethelene Hal, NP      . aspirin EC tablet 81 mg  81 mg Oral Daily Ethelene Hal, NP   81 mg at 11/22/18 0749  . atorvastatin (LIPITOR) tablet 20 mg  20 mg Oral Daily Ethelene Hal, NP   20 mg at 11/22/18 0750  . buPROPion (WELLBUTRIN XL) 24 hr tablet 150 mg  150 mg Oral Daily Sharma Covert, MD      .  hydrochlorothiazide (HYDRODIURIL) tablet 25 mg  25 mg Oral Daily Ethelene Hal, NP   25 mg at 11/22/18 7322  . hydrOXYzine (ATARAX/VISTARIL) tablet 25 mg  25 mg Oral TID PRN Ethelene Hal, NP      . magnesium hydroxide (MILK OF MAGNESIA) suspension 30 mL  30 mL Oral Daily PRN Ethelene Hal, NP      . ondansetron [facility]) tablet 4 mg  4 mg Oral Q8H PRN Ethelene Hal, NP      . perphenazine (TRILAFON) tablet 2 mg  2 mg Oral QPC breakfast Johnn Hai, MD   2 mg at 11/22/18 0750  . perphenazine (TRILAFON) tablet 4 mg  4 mg Oral QHS Johnn Hai, MD   4 mg at 11/21/18 2138  . zolpidem (AMBIEN) tablet 10 mg  10 mg Oral QHS PRN Johnn Hai, MD   10 mg at 11/21/18 2138    Lab Results: No results found for this or any previous visit (from the past 7 hour(s)).  Blood Alcohol level:  Lab Results  Component Value Date   ETH <10 11/18/2018   ETH <10 02/54/2706    Metabolic Disorder Labs: Lab Results  Component Value Date   HGBA1C 5.4 11/19/2018   MPG 108.28 11/19/2018   MPG 105.41 09/22/2017   No results found for: PROLACTIN Lab Results  Component Value Date   CHOL 134 11/19/2018   TRIG 81 11/19/2018   HDL 41 11/19/2018   CHOLHDL 3.3 11/19/2018   VLDL 16 11/19/2018   LDLCALC 77 11/19/2018   LDLCALC 189 (H) 09/22/2017    Physical Findings: AIMS:  , ,  ,  ,    CIWA:    COWS:     Musculoskeletal: Strength & Muscle Tone: within normal limits Gait & Station: normal Patient leans: N/A  Psychiatric Specialty Exam: Physical Exam  Nursing note and vitals reviewed. Constitutional: She is oriented to person, place, and time. She appears well-developed and well-nourished.  HENT:  Head: Normocephalic and atraumatic.  Respiratory: Effort normal.  Neurological: She is alert and oriented to person, place, and time.    ROS  Blood pressure (!) 133/93, pulse 99, temperature 99.6 F (37.6 C), temperature source Oral, last menstrual period 11/18/2018.There  is no height or weight on file to calculate BMI.  General Appearance: Casual  Eye Contact:  Fair  Speech:  Normal Rate  Volume:  Decreased  Mood:  Anxious and Depressed  Affect:  Congruent  Thought Process:  Coherent and Descriptions of Associations: Intact  Orientation:  Full (Time, Place, and Person)  Thought Content:  Logical  Suicidal Thoughts:  No  Homicidal Thoughts:  No  Memory:  Immediate;   Fair Recent;   Fair Remote;   Fair  Judgement:  Intact  Insight:  Fair  Psychomotor Activity:  Decreased  Concentration:  Concentration: Fair and Attention Span: Fair  Recall:  [employer] of Knowledge:  Fair  Language:  Fair  Akathisia:  Negative  Handed:  Right  AIMS (if indicated):     Assets:  Communication Skills Desire for Improvement Housing Physical Health Resilience  ADL's:  Intact  Cognition:  WNL  Sleep:  Number of Hours: 5.75     Treatment Plan Summary: Daily contact with patient to assess and evaluate symptoms and progress in treatment, Medication management and Plan : Patient is seen and examined.  Patient is a 43 year old female with the above-stated past psychiatric history who is seen in follow-up. Patient is more interactive today.  She is requesting to go back on Wellbutrin.  She stated she had been on it for quite a while in the past and been successful.  She is refused her Remeron and Effexor for 2 days.  She denied any auditory or visual hallucinations.  She denied any suicidal ideation.  I will restart her Wellbutrin XL at 150 mg p.o. daily.  I will stop the Remeron and Effexor.  No other changes to her medications.  Her sleep is adequate with the Ambien and the perphenazine, but she may need something else in the future. 1.  Stop Remeron and venlafaxine. 2.  Start Wellbutrin XL 150 mg p.o. daily for mood and anxiety. 3.  Continue Lipitor 20 mg p.o. daily for hyperlipidemia. 4.  Continue hydrochlorothiazide 25 mg p.o. daily for hypertension. 5.  Continue  hydroxyzine 25 mg p.o. 3 times daily as needed anxiety. 6.  Continue Zofran 4 mg every 8 hours as needed nausea. 7.  Continue Trilafon 2 mg p.o. daily and 4 mg p.o. nightly for psychosis. 8.  Continue Ambien 10 mg p.o. nightly as needed insomnia. 9.  Disposition planning-in progress.  Sharma Covert, MD 11/22/2018, 11:18 AM

## 2018-11-22 NOTE — B[facility] Suicide Risk Assessment (Signed)
[place] INPATIENT:  Family/Significant Other Suicide Prevention Education  Suicide Prevention Education:  Contact Attempts: Zollie Clemence, daughter, [phone], (name of family member/significant other) has been identified by the patient as the family member/significant other with whom the patient will be residing, and identified as the person(s) who will aid the patient in the event of a mental health crisis.  With written consent from the patient, two attempts were made to provide suicide prevention education, prior to and/or following the patient's discharge.  We were unsuccessful in providing suicide prevention education.  A suicide education pamphlet was given to the patient to share with family/significant other.  Date and time of first attempt:  2 attempts made on 11/20/2018 Date and time of second attempt:  11/22/2018  /  4:06 PM  - HIPAA-compliant voicemail left informing a brochure will be sent home with patient  Maretta Los 11/22/2018, 4:05 PM

## 2018-11-22 NOTE — BHH Group Notes (Signed)
[place] Group Notes:  (Nursing)  Date:  11/22/2018  Time: 1:30 Type of Therapy:  Nurse Education  Participation Level:  Active  Participation Quality:  Appropriate  Affect:  Appropriate  Cognitive:  Appropriate  Insight:  Appropriate  Engagement in Group:  Engaged  Modes of Intervention:  Discussion and Education  Summary of Progress/Problems: Group played a non competitive Pharmacist, hospital game that fosters listening skills as well as self expression.  [name] 11/22/2018, 2:58 PM

## 2018-11-22 NOTE — Progress Notes (Addendum)
D. Pt presents with an anxious affect and improving mood- smiles during interactions-observed interacting appropriately with peers in the milieu- per pt's self inventory, pt rates her depression, hopelessness and anxiety a 0/0/5, respectively. Pt writes that her most important goal today is to "start Wellbutrin for depression and get ready to go home". Pt currently denies SI/HI and AV hallucinations. A. Labs and vitals monitored. Pt compliant with medications. Pt supported emotionally and encouraged to express concerns and ask questions.   R. Pt remains safe with 15 minute checks. Will continue POC.

## 2018-11-23 MED ORDER — LORAZEPAM 0.5 MG PO TABS
ORAL_TABLET | ORAL | Status: AC
  Filled 2018-11-23: qty 1

## 2018-11-23 MED ORDER — IBUPROFEN 400 MG PO TABS
400.0000 mg | ORAL_TABLET | Freq: Once | ORAL | Status: AC
  Administered 2018-11-23: 400 mg via ORAL
  Filled 2018-11-23 (×2): qty 1

## 2018-11-23 MED ORDER — PANTOPRAZOLE SODIUM 40 MG PO TBEC
40.0000 mg | DELAYED_RELEASE_TABLET | Freq: Every day | ORAL | Status: DC
  Administered 2018-11-23 – 2018-11-26 (×2): 40 mg via ORAL
  Filled 2018-11-23 (×7): qty 1

## 2018-11-23 MED ORDER — IBUPROFEN 400 MG PO TABS: 400.0000 mg | ORAL_TABLET | Freq: Three times a day (TID) | ORAL | Status: DC | PRN

## 2018-11-23 MED ORDER — PERPHENAZINE 4 MG PO TABS
4.0000 mg | ORAL_TABLET | Freq: Two times a day (BID) | ORAL | Status: DC
  Administered 2018-11-24 – 2018-11-26 (×5): 4 mg via ORAL
  Filled 2018-11-23 (×9): qty 1

## 2018-11-23 MED ORDER — LORAZEPAM 0.5 MG PO TABS
0.5000 mg | ORAL_TABLET | Freq: Four times a day (QID) | ORAL | Status: DC | PRN
  Administered 2018-11-23 – 2018-11-26 (×6): 0.5 mg via ORAL
  Filled 2018-11-23 (×5): qty 1

## 2018-11-23 NOTE — Progress Notes (Addendum)
[facility] MD Progress Note ( interviewed in [place]) 11/23/2018 2:56 PM Alicia Robinson  MRN:  [id number] Subjective:  Patient states she had been feeling more anxious today, but is unsure why. States mood has improved partially since admission, but reports she still feels depressed . Denies medication side effects. Denies suicidal ideations.   Objective:  I met with patient and have reviewed case with treatment team .  Patient is a 43 year old female with a history of depression, psychosis, cannabis dependence. Presented for depression, hallucinations ( reports had been seeing a clown on day of admission , which she attributes to feeling very stressed that day after an argument with her son). On admission was noted to be almost mute and confused.  States she is now feeling partially better, less depressed, and today is fully verbal . Acknowledges partial improvement, but describes some lingering depression and states that she has been feeling more anxious today. She denies suicidal ideations .Reports hallucinations have decreased but not entirely resolved , and still has reported intermittent hallucinations. Currently does not present  internally preoccupied . Denies medication side effects.  Some group participation, no disruptive or agitated behaviors on unit .   Principal Problem:  MDD, with Psychotic Features, Cannabis Use Disorder  Diagnosis: Active Problems:   MDD (major depressive disorder), recurrent, severe, with psychosis ([place])  Total Time spent with patient: 20 minutes  Past Psychiatric History:     Past Medical History:  Past Medical History:  Diagnosis Date  . Depression   . Hypertension   . Suicidal overdose [facility])     Past Surgical History:  Procedure Laterality Date  . CHOLECYSTECTOMY    . TUBAL LIGATION     Family History: History reviewed. No pertinent family history. Family Psychiatric  History: See admission H&P Social History:  Social History   Substance and  Sexual Activity  Alcohol Use Yes   Comment: weekends     Social History   Substance and Sexual Activity  Drug Use Yes  . Types: Marijuana    Social History   Socioeconomic History  . Marital status: Single    Spouse name: Not on file  . Number of children: Not on file  . Years of education: Not on file  . Highest education level: Not on file  Occupational History  . Not on file  Social Needs  . Financial resource strain: Not on file  . Food insecurity:    Worry: Not on file    Inability: Not on file  . Transportation needs:    Medical: Not on file    Non-medical: Not on file  Tobacco Use  . Smoking status: Never Smoker  . Smokeless tobacco: Never Used  Substance and Sexual Activity  . Alcohol use: Yes    Comment: weekends  . Drug use: Yes    Types: Marijuana  . Sexual activity: Yes  Lifestyle  . Physical activity:    Days per week: Not on file    Minutes per session: Not on file  . Stress: Not on file  Relationships  . Social connections:    Talks on phone: Not on file    Gets together: Not on file    Attends religious service: Not on file    Active member of club or organization: Not on file    Attends meetings of clubs or organizations: Not on file    Relationship status: Not on file  Other Topics Concern  . Not on file  Social  History Narrative  . Not on file   Additional Social History:   Sleep: improving   Appetite:  improving   Current Medications: Current Facility-Administered Medications  Medication Dose Route Frequency Provider Last Rate Last Dose  . acetaminophen (TYLENOL) tablet 650 mg  650 mg Oral Q6H PRN Ethelene Hal, NP      . alum & mag hydroxide-simeth (MAALOX/MYLANTA) 200-200-20 MG/5ML suspension 30 mL  30 mL Oral Q4H PRN Ethelene Hal, NP      . aspirin EC tablet 81 mg  81 mg Oral Daily Ethelene Hal, NP   81 mg at 11/23/18 0755  . atorvastatin (LIPITOR) tablet 20 mg  20 mg Oral Daily Ethelene Hal,  NP   20 mg at 11/23/18 0755  . buPROPion (WELLBUTRIN XL) 24 hr tablet 150 mg  150 mg Oral Daily Sharma Covert, MD   150 mg at 11/23/18 0756  . hydrochlorothiazide (HYDRODIURIL) tablet 25 mg  25 mg Oral Daily Ethelene Hal, NP   25 mg at 11/23/18 0755  . hydrOXYzine (ATARAX/VISTARIL) tablet 25 mg  25 mg Oral TID PRN Ethelene Hal, NP      . magnesium hydroxide (MILK OF MAGNESIA) suspension 30 mL  30 mL Oral Daily PRN Ethelene Hal, NP      . ondansetron [facility]) tablet 4 mg  4 mg Oral Q8H PRN Ethelene Hal, NP      . perphenazine (TRILAFON) tablet 2 mg  2 mg Oral QPC breakfast Johnn Hai, MD   2 mg at 11/23/18 3403  . perphenazine (TRILAFON) tablet 4 mg  4 mg Oral QHS Johnn Hai, MD   4 mg at 11/21/18 2138  . zolpidem (AMBIEN) tablet 10 mg  10 mg Oral QHS PRN Johnn Hai, MD   10 mg at 11/21/18 2138    Lab Results: No results found for this or any previous visit (from the past 52 hour(s)).  Blood Alcohol level:  Lab Results  Component Value Date   ETH <10 11/18/2018   ETH <10 70/96/4383    Metabolic Disorder Labs: Lab Results  Component Value Date   HGBA1C 5.4 11/19/2018   MPG 108.28 11/19/2018   MPG 105.41 09/22/2017   No results found for: PROLACTIN Lab Results  Component Value Date   CHOL 134 11/19/2018   TRIG 81 11/19/2018   HDL 41 11/19/2018   CHOLHDL 3.3 11/19/2018   VLDL 16 11/19/2018   LDLCALC 77 11/19/2018   LDLCALC 189 (H) 09/22/2017    Physical Findings: AIMS:  , ,  ,  ,    CIWA:    COWS:     Musculoskeletal: Strength & Muscle Tone: within normal limits Gait & Station: normal Patient leans: N/A  Psychiatric Specialty Exam: Physical Exam  Nursing note and vitals reviewed. Constitutional: She is oriented to person, place, and time. She appears well-developed and well-nourished.  HENT:  Head: Normocephalic and atraumatic.  Respiratory: Effort normal.  Neurological: She is alert and oriented to person, place, and  time.    ROS mild headache, no chest pain, no shortness of breath, no vomiting   Blood pressure 119/85, pulse 85, temperature 99.6 F (37.6 C), temperature source Oral, resp. rate 16, last menstrual period 11/18/2018.There is no height or weight on file to calculate BMI.  General Appearance: Fairly Groomed  Eye Contact:  Fair  Speech:  Normal Rate  Volume:  Normal  Mood:  reports some improvement in mood but still depressed  Affect:  Congruent and vaguely constricted, anxious, tends to improve partially during session  Thought Process:  Linear and Descriptions of Associations: Intact  Orientation:  Other:  fully alert and attentive, fully oriented x 3  Thought Content:  currently denies suicidal or self injurious ideations, denies any homicidal or violent ideations  Suicidal Thoughts:  No  Homicidal Thoughts:  No  Memory:  recent and remote grossly intact   Judgement:  Fair/ improving   Insight:  Fair/improving   Psychomotor Activity:  Normal  Concentration:  Concentration: Good and Attention Span: Good  Recall:  Good  Fund of Knowledge:  Good  Language:  Good  Akathisia:  Negative  Handed:  Right  AIMS (if indicated):     Assets:  Communication Skills Desire for Improvement Housing Physical Health Resilience  ADL's:  Intact  Cognition:  WNL  Sleep:  Number of Hours: 6.75   Assessment -  Patient is a 43 year old female with a history of depression, psychosis, cannabis dependence. Presented for depression, hallucinations ( reports had been seeing a clown on day of admission , which she attributes to feeling very stressed that day after an argument with her son). On admission was noted to be almost mute and confused.  Today reports partially improved mood and states feeling better than she did on admission, presents fully alert and attentive, oriented x 3, verbal ( no selective mutism at this time ) and denies any lingering suicidal ideations. Currently complains of anxiety, and  states she has felt more anxious today.   Treatment Plan Summary: Treatment plan reviewed as below today 3/9 Encourage group and milieu participation to work on coping skills and symptom reduction Encourage efforts to remain sober/abstinent from cannabis- potential negative impact of cannabis on her mental health has been reviewed  Treatment team working on disposition planning options Continue Wellbutrin XL 150 mg p.o. daily for mood  Patient reports Motrin is more effective for headache than Acetaminophen, takes it at home without side effects- Ibuprofen 400 mgrs x 1 for HA.  Increase Trilafon to 4 mgrs BID  for psychosis Patient reports she does not like Ambien, did not take it last night, and slept "OK without it. D/C Ambien Ativan 0.5 mgrs Q 6 hours PRN for anxiety as needed Resume PPI for GERD  Continue Lipitor 20 mg p.o. daily for hyperlipidemia, ASA for CVA prevention Continue Hydrochlorothiazide 25 mg p.o. daily for HTN Check BMP in AM to follow electrolyte/ K+ Jenne Campus, MD 11/23/2018, 2:56 PM   Patient ID: Alicia Robinson, female   DOB: [birth year], 43 y.o.   MRN: [id number]

## 2018-11-23 NOTE — Plan of Care (Signed)
  Problem: Coping: Goal: Ability to identify and develop effective coping behavior will improve Outcome: Progressing   Problem: Activity: Goal: Interest or engagement in activities will improve Outcome: Progressing   D: Pt alert and oriented on the unit. Pt engaging with RN staff and other pts. Pt denies SI/HI, and A/VH. Pt's affect was flat. Pt rated her depression and anxiety a 3, and her hopeless a 0, with 10 being the worst. Pt's goal for today is "getting ready to go home." Pt is pleasant and cooperative.  A: Education, support and encouragement provided, q15 minute safety checks remain in effect. Medications administered per MD orders.  R: No reactions/side effects to medicine noted. Pt denies any concerns at this time, and verbally contracts for safety. Pt ambulating on the unit with no issues. Pt remains safe on and off the unit.

## 2018-11-24 LAB — BASIC METABOLIC PANEL
Anion gap: 8 (ref 5–15)
BUN: 13 mg/dL (ref 6–20)
CO2: 26 mmol/L (ref 22–32)
Calcium: 8.6 mg/dL — ABNORMAL LOW (ref 8.9–10.3)
Chloride: 104 mmol/L (ref 98–111)
Creatinine, Ser: 0.94 mg/dL (ref 0.44–1.00)
Glucose, Bld: 138 mg/dL — ABNORMAL HIGH (ref 70–99)
Potassium: 3.2 mmol/L — ABNORMAL LOW (ref 3.5–5.1)
Sodium: 138 mmol/L (ref 135–145)

## 2018-11-24 MED ORDER — MIRTAZAPINE 15 MG PO TABS
15.0000 mg | ORAL_TABLET | Freq: Every day | ORAL | Status: DC
  Administered 2018-11-24 – 2018-11-25 (×2): 15 mg via ORAL
  Filled 2018-11-24 (×4): qty 1

## 2018-11-24 MED ORDER — TRAZODONE HCL 50 MG PO TABS: 50.0000 mg | ORAL_TABLET | Freq: Every evening | ORAL | Status: DC | PRN

## 2018-11-24 NOTE — Progress Notes (Signed)
[facility] MD Progress Note  11/24/2018 2:16 PM Alicia Robinson  MRN:  [id number] Subjective:  Patient is a 43 year old female with a past psychiatric history significant for depression and psychosis. She presented to the [facility] emergency department on 11/18/2018 with a friend. The patient was initially a verbal on admission. Her initial complaint was "I am confused, my head hurts". She carried a diagnosis of cannabis dependency and depression with recurrent psychosis in the past.  Objective: Patient is seen and examined.  Patient is a 43 year old female with the above-stated past psychiatric history who is seen in follow-up.  Nursing stated this morning in morning report that she has been more irritable.  I discussed this with the patient.  On Sunday we had stopped both the Remeron and Effexor because of her request.  She had been noncompliant with it anyway.  She was started on Wellbutrin.  I was concerned that the Wellbutrin may be increasing her irritability.  She admitted increased irritability and stated that yesterday she was having more suicidal ideation, and had visual hallucinations of "the clown".  These are the same visual hallucination she usually has.  We discussed today potentially going back on the Remeron.  She absolutely does not want to return to the Effexor.  She denied suicidal ideation today.  Her vital signs are stable, she is afebrile.  She slept 6.5 hours last night.  Yesterday the increased her Trilafon to 4 mg p.o. twice daily.  She had her chemistries rechecked today.  Her creatinine is normal at 0.94.  Her potassium is slightly low at 3.2.  The last check of her liver function enzymes showed her ALT elevated at 50.  Her platelets were over 500,000 in the past, but she is taken an aspirin a day for that.  Principal Problem: <principal problem not specified> Diagnosis: Active Problems:   MDD (major depressive disorder), recurrent, severe, with psychosis  ([place])  Total Time spent with patient: 15 minutes  Past Psychiatric History: See admission H&P  Past Medical History:  Past Medical History:  Diagnosis Date  . Depression   . Hypertension   . Suicidal overdose [facility])     Past Surgical History:  Procedure Laterality Date  . CHOLECYSTECTOMY    . TUBAL LIGATION     Family History: History reviewed. No pertinent family history. Family Psychiatric  History: See admission H&P Social History:  Social History   Substance and Sexual Activity  Alcohol Use Yes   Comment: weekends     Social History   Substance and Sexual Activity  Drug Use Yes  . Types: Marijuana    Social History   Socioeconomic History  . Marital status: Single    Spouse name: Not on file  . Number of children: Not on file  . Years of education: Not on file  . Highest education level: Not on file  Occupational History  . Not on file  Social Needs  . Financial resource strain: Not on file  . Food insecurity:    Worry: Not on file    Inability: Not on file  . Transportation needs:    Medical: Not on file    Non-medical: Not on file  Tobacco Use  . Smoking status: Never Smoker  . Smokeless tobacco: Never Used  Substance and Sexual Activity  . Alcohol use: Yes    Comment: weekends  . Drug use: Yes    Types: Marijuana  . Sexual activity: Yes  Lifestyle  . Physical activity:  Days per week: Not on file    Minutes per session: Not on file  . Stress: Not on file  Relationships  . Social connections:    Talks on phone: Not on file    Gets together: Not on file    Attends religious service: Not on file    Active member of club or organization: Not on file    Attends meetings of clubs or organizations: Not on file    Relationship status: Not on file  Other Topics Concern  . Not on file  Social History Narrative  . Not on file   Additional Social History:                         Sleep: Good  Appetite:  Fair  Current  Medications: Current Facility-Administered Medications  Medication Dose Route Frequency Provider Last Rate Last Dose  . acetaminophen (TYLENOL) tablet 650 mg  650 mg Oral Q6H PRN Ethelene Hal, NP   650 mg at 11/24/18 0526  . alum & mag hydroxide-simeth (MAALOX/MYLANTA) 200-200-20 MG/5ML suspension 30 mL  30 mL Oral Q4H PRN Ethelene Hal, NP      . aspirin EC tablet 81 mg  81 mg Oral Daily Ethelene Hal, NP   81 mg at 11/24/18 0746  . atorvastatin (LIPITOR) tablet 20 mg  20 mg Oral Daily Ethelene Hal, NP   20 mg at 11/24/18 0746  . buPROPion (WELLBUTRIN XL) 24 hr tablet 150 mg  150 mg Oral Daily Sharma Covert, MD   150 mg at 11/24/18 0746  . hydrochlorothiazide (HYDRODIURIL) tablet 25 mg  25 mg Oral Daily Ethelene Hal, NP   25 mg at 11/24/18 0746  . LORazepam (ATIVAN) tablet 0.5 mg  0.5 mg Oral Q6H PRN Cobos, Myer Peer, MD   0.5 mg at 11/24/18 0553  . magnesium hydroxide (MILK OF MAGNESIA) suspension 30 mL  30 mL Oral Daily PRN Ethelene Hal, NP      . mirtazapine (REMERON) tablet 15 mg  15 mg Oral QHS Sharma Covert, MD      . ondansetron [facility]) tablet 4 mg  4 mg Oral Q8H PRN Ethelene Hal, NP      . pantoprazole (PROTONIX) EC tablet 40 mg  40 mg Oral Daily Cobos, Myer Peer, MD   40 mg at 11/23/18 1754  . perphenazine (TRILAFON) tablet 4 mg  4 mg Oral BID Cobos, Myer Peer, MD   4 mg at 11/24/18 0746  . traZODone (DESYREL) tablet 50 mg  50 mg Oral QHS PRN Rozetta Nunnery, NP        Lab Results:  Results for orders placed or performed during the hospital encounter of 11/19/18 (from the past 48 hour(s))  Basic metabolic panel     Status: Abnormal   Collection Time: 11/24/18  6:34 AM  Result Value Ref Range   Sodium 138 135 - 145 mmol/L   Potassium 3.2 (L) 3.5 - 5.1 mmol/L   Chloride 104 98 - 111 mmol/L   CO2 26 22 - 32 mmol/L   Glucose, Bld 138 (H) 70 - 99 mg/dL   BUN 13 6 - 20 mg/dL   Creatinine, Ser 0.94 0.44 - 1.00  mg/dL   Calcium 8.6 (L) 8.9 - 10.3 mg/dL   GFR calc non Af Amer >60 >60 mL/min   GFR calc Af Amer >60 >60 mL/min   Anion gap 8 5 - 15  Comment: Performed at [facility], [place] [address], [place], [place] [address]    Blood Alcohol level:  Lab Results  Component Value Date   ETH <10 11/18/2018   ETH <10 62/11/5595    Metabolic Disorder Labs: Lab Results  Component Value Date   HGBA1C 5.4 11/19/2018   MPG 108.28 11/19/2018   MPG 105.41 09/22/2017   No results found for: PROLACTIN Lab Results  Component Value Date   CHOL 134 11/19/2018   TRIG 81 11/19/2018   HDL 41 11/19/2018   CHOLHDL 3.3 11/19/2018   VLDL 16 11/19/2018   LDLCALC 77 11/19/2018   LDLCALC 189 (H) 09/22/2017    Physical Findings: AIMS:  , ,  ,  ,    CIWA:    COWS:     Musculoskeletal: Strength & Muscle Tone: within normal limits Gait & Station: normal Patient leans: N/A  Psychiatric Specialty Exam: Physical Exam  Nursing note and vitals reviewed. Constitutional: She is oriented to person, place, and time. She appears well-developed and well-nourished.  HENT:  Head: Normocephalic and atraumatic.  Respiratory: Effort normal.  Neurological: She is alert and oriented to person, place, and time.    ROS  Blood pressure 127/89, pulse 99, temperature 98.3 F (36.8 C), resp. rate 18, last menstrual period 11/18/2018.There is no height or weight on file to calculate BMI.  General Appearance: Casual  Eye Contact:  Fair  Speech:  Normal Rate  Volume:  Normal  Mood:  Anxious and Irritable  Affect:  Congruent  Thought Process:  Coherent and Descriptions of Associations: Intact  Orientation:  Full (Time, Place, and Person)  Thought Content:  Logical  Suicidal Thoughts:  No  Homicidal Thoughts:  No  Memory:  Immediate;   Fair Recent;   Fair Remote;   Fair  Judgement:  Intact  Insight:  Fair  Psychomotor Activity:  Increased  Concentration:  Concentration: Fair and Attention  Span: Fair  Recall:  [employer] of Knowledge:  Fair  Language:  Fair  Akathisia:  Negative  Handed:  Right  AIMS (if indicated):     Assets:  Desire for Improvement Housing Resilience Social Support  ADL's:  Intact  Cognition:  WNL  Sleep:  Number of Hours: 6.5     Treatment Plan Summary: Daily contact with patient to assess and evaluate symptoms and progress in treatment, Medication management and Plan : Patient is seen and examined.  Patient is a 43 year old female with the above-stated past psychiatric history who is seen in follow-up.  Patient has been more irritable over the last 2 days.  I am concerned it may be the Wellbutrin.  I am also concerned that may be coming off of the Effexor and the Remeron.  She refuses to go back on the Effexor, so we will continue to stop that.  I am going to restart her on Remeron 15 mg p.o. nightly and will titrate that.  No change in the Wellbutrin XL at this point.  I want to see if this has any effect on her irritability.  Her Trilafon was increased to 4 mg p.o. twice daily yesterday for psychosis and this will be continued.  They did repeat chemistries are stable, but I will write for a CBC and differential as well as liver function enzymes in the a.m. tomorrow. 1.  Restart Remeron at 15 mg p.o. nightly for sleep, mood and irritability. 2.  Continue Wellbutrin XL 150 mg p.o. daily for mood and anxiety. 3.  Continue Lipitor 20 mg p.o. daily for hyperlipidemia. 4.  Continue hydrochlorothiazide 25 mg p.o. daily for hypertension. 5.  Continue hydroxyzine 25 mg p.o. 3 times daily as needed anxiety. 6.  Continue Zofran 4 mg p.o. every 8 hours as needed nausea. 7.  Continue Trilafon 4 mg p.o. twice daily for psychosis. 8.  Continue coated aspirin 81 mg p.o. daily for elevated platelets. 9.  Disposition planning-in progress.  Sharma Covert, MD 11/24/2018, 2:16 PM

## 2018-11-24 NOTE — Progress Notes (Signed)
Nursing Progress Note: 7p-7a D: Pt currently presents with a anxious/depressed affect and behavior. Interacting appropriately with the milieu. Pt reports good sleep during the previous night with current medication regimen. Pt did not attend wrap-up group.  A: Pt provided with medications per providers orders. Pt's labs and vitals were monitored throughout the night. Pt supported emotionally and encouraged to express concerns and questions. Pt educated on medications.  R: Pt's safety ensured with 15 minute and environmental checks. Pt currently denies HI and AVH and endorses passive SI. Pt verbally contracts to seek staff if SI,HI, or AVH occurs and to consult with staff before acting on any harmful thoughts. Will continue to monitor.

## 2018-11-24 NOTE — Progress Notes (Signed)
Veta came up to RN stating her anxiety was much worse this morning.  She was noted with anxious affect and fidgety.  She also reported having suicidal ideation with no plan or intent and will seek out staff if she feels like she is going to hurt herself.  PRN ativan given.  We will continue to monitor.

## 2018-11-24 NOTE — Progress Notes (Signed)
Patient attended grief and loss group facilitated by Kerry Hough, MS, [facility], [place].   Group focuses on change and loss experienced by group members; topics include loss due to death, loss of relationships, loss of a place, loss of sense of self, etc. Group members are invited to share the changes and losses that are currently affecting their lives. Facilitators tie together shared experiences between group members and validate emotions felt by participants.  Group members participated in a visual art activity in which they chose photos that resonated with their experience of grief. Members were given the opportunity to share the photo they chose with the group.   Patient Alicia Robinson was present throughout group. Pt participated in discussion and shared that she has a difficult time finding supportive people in her life. Pt reported that she will have a taxi bring her home as she does not believe she has a support system she can rely on to pick her up. Pt chose a photo of a lion roaring and shared that it reminds her of how she feels when she wants to scream. Pt discussed how the landscapes in the photo reminded her of home and how she feels safety when she thinks about her home country. Pt reported feeling hopefulness about returning home someday.  Kerry Hough, MS, [facility], [facility]\

## 2018-11-24 NOTE — Progress Notes (Signed)
D:  Alicia Robinson was in her room on initial approach.  She was sitting quietly coloring in her book.  She reported her day was not well but is feeling a little better this evening.  She denied SI/HI or A/V hallucinations.  She did attend evening wrap up group.  She denied pain or discomfort and appeared to be on no physical distress.  She is currently resting quietly with her eyes closed and appeared to be no physical distress.   A:  1:1 with RN for support and encouragement.  Medications as ordered.  Q 15 minute checks maintained for safety.  Encouraged participation in group and unit activities.   R:  Alicia Robinson remains safe on the unit.  We will continue to monitor the progress towards her goals.

## 2018-11-24 NOTE — [facility] Group Notes (Signed)
LCSW Group Therapy Note 11/24/2018 3:21 PM  Type of Therapy and Topic: Group Therapy: Overcoming Obstacles  Participation Level: Active  Description of Group:  In this group patients will be encouraged to explore what they see as obstacles to their own wellness and recovery. They will be guided to discuss their thoughts, feelings, and behaviors related to these obstacles. The group will process together ways to cope with barriers, with attention given to specific choices patients can make. Each patient will be challenged to identify changes they are motivated to make in order to overcome their obstacles. This group will be process-oriented, with patients participating in exploration of their own experiences as well as giving and receiving support and challenge from other group members.  Therapeutic Goals: 1. Patient will identify personal and current obstacles as they relate to admission. 2. Patient will identify barriers that currently interfere with their wellness or overcoming obstacles.  3. Patient will identify feelings, thought process and behaviors related to these barriers. 4. Patient will identify two changes they are willing to make to overcome these obstacles:   Summary of Patient Progress  Alicia Robinson was engaged and participated throughout the group session. Alicia Robinson states that her main obstacle is "my job". Alicia Robinson states that her main barrier is her work schedule. She states that it prevents her from attending appointments due to the demanding hours.    Therapeutic Modalities:  Cognitive Behavioral Therapy Solution Focused Therapy Motivational Interviewing Relapse Prevention Therapy   Theresa Duty Clinical Social Worker

## 2018-11-24 NOTE — Progress Notes (Signed)
Patient stated that her day was a 10, but after lunch it became a 1. Patient stated she became very depressed and and had a bad headache.  Patient's goal was to go home. Patient did not meet her goal, but realized she was not ready to go home.
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
## 2018-11-24 NOTE — Plan of Care (Signed)
  Problem: Self-Concept: Goal: Level of anxiety will decrease Outcome: Not Progressing   Problem: Activity: Goal: Interest or engagement in activities will improve Outcome: Progressing   Problem: Safety: Goal: Periods of time without injury will increase Outcome: Progressing  DAR NOTE: Patient presents with anxious affect and irritable mood.  Report passive SI but contracts for safety.  Rates depression at 10, hopelessness at 10, and anxiety at 10.  Maintained on routine safety checks.  Medications given as prescribed.  Support and encouragement offered as needed.  Attended group and participated.  Patient visible in milieu with minimal interaction with peers.  Patient is safe on and off the unit.

## 2018-11-25 MED ORDER — BUPROPION HCL ER (XL) 300 MG PO TB24
300.0000 mg | ORAL_TABLET | Freq: Every day | ORAL | Status: DC
  Administered 2018-11-26: 300 mg via ORAL
  Filled 2018-11-25 (×3): qty 1

## 2018-11-25 MED ORDER — IBUPROFEN 400 MG PO TABS
400.0000 mg | ORAL_TABLET | Freq: Four times a day (QID) | ORAL | Status: DC | PRN
  Administered 2018-11-25: 400 mg via ORAL
  Filled 2018-11-25: qty 1

## 2018-11-25 MED ORDER — BUPROPION HCL ER (XL) 150 MG PO TB24
150.0000 mg | ORAL_TABLET | Freq: Once | ORAL | Status: AC
  Administered 2018-11-25: 150 mg via ORAL
  Filled 2018-11-25 (×2): qty 1

## 2018-11-25 NOTE — Tx Team (Signed)
Interdisciplinary Treatment and Diagnostic Plan Update  11/25/2018 Time of Session: 11/20/2018 9:30 AM Alicia Robinson MRN: [id number]  Principal Diagnosis: <principal problem not specified>  Secondary Diagnoses: Active Problems:   MDD (major depressive disorder), recurrent, severe, with psychosis ([place])   Current Medications:  Current Facility-Administered Medications  Medication Dose Route Frequency Provider Last Rate Last Dose  . acetaminophen (TYLENOL) tablet 650 mg  650 mg Oral Q6H PRN Ethelene Hal, NP   650 mg at 11/24/18 0526  . alum & mag hydroxide-simeth (MAALOX/MYLANTA) 200-200-20 MG/5ML suspension 30 mL  30 mL Oral Q4H PRN Ethelene Hal, NP      . aspirin EC tablet 81 mg  81 mg Oral Daily Ethelene Hal, NP   81 mg at 11/25/18 0749  . atorvastatin (LIPITOR) tablet 20 mg  20 mg Oral Daily Ethelene Hal, NP   20 mg at 11/25/18 0749  . [START ON 11/26/2018] buPROPion (WELLBUTRIN XL) 24 hr tablet 300 mg  300 mg Oral Daily Sharma Covert, MD      . hydrochlorothiazide (HYDRODIURIL) tablet 25 mg  25 mg Oral Daily Ethelene Hal, NP   25 mg at 11/25/18 0749  . ibuprofen (ADVIL,MOTRIN) tablet 400 mg  400 mg Oral Q6H PRN Sharma Covert, MD   400 mg at 11/25/18 0920  . LORazepam (ATIVAN) tablet 0.5 mg  0.5 mg Oral Q6H PRN Cobos, Myer Peer, MD   0.5 mg at 11/25/18 0753  . magnesium hydroxide (MILK OF MAGNESIA) suspension 30 mL  30 mL Oral Daily PRN Ethelene Hal, NP      . mirtazapine (REMERON) tablet 15 mg  15 mg Oral QHS Sharma Covert, MD   15 mg at 11/24/18 2111  . ondansetron (ZOFRAN) tablet 4 mg  4 mg Oral Q8H PRN Ethelene Hal, NP      . pantoprazole (PROTONIX) EC tablet 40 mg  40 mg Oral Daily Cobos, Myer Peer, MD   40 mg at 11/23/18 1754  . perphenazine (TRILAFON) tablet 4 mg  4 mg Oral BID Cobos, Myer Peer, MD   4 mg at 11/25/18 0749  . traZODone (DESYREL) tablet 50 mg  50 mg Oral QHS PRN Rozetta Nunnery, NP        PTA Medications: Medications Prior to Admission  Medication Sig Dispense Refill Last Dose  . Ascorbic Acid (VITAMIN C) 100 MG tablet Take 100 mg by mouth daily.   11/17/2018 at Unknown time  . aspirin EC 81 MG tablet Take 1 tablet (81 mg total) by mouth daily. For heart health   11/18/2018 at Unknown time  . atorvastatin (LIPITOR) 20 MG tablet Take 1 tablet (20 mg total) by mouth daily. For high cholesterol   11/18/2018 at Unknown time  . cholecalciferol (VITAMIN D3) 25 MCG (1000 UT) tablet Take 1,000 Units by mouth daily.   11/17/2018 at Unknown time  . fluconazole (DIFLUCAN) 150 MG tablet Take 1 tablet (150 mg total) by mouth daily. (For 3 more days): For yeast infection (Patient not taking: Reported on 11/18/2018) 3 tablet 0 Completed Course at Unknown time  . GARLIC PO Take 1 capsule by mouth daily.   11/17/2018 at Unknown time  . hydrochlorothiazide (HYDRODIURIL) 25 MG tablet Take 1 tablet (25 mg total) by mouth daily. For high blood pressure 30 tablet 0 11/18/2018 at Unknown time  . Multiple Vitamins-Minerals (HAIR SKIN AND NAILS FORMULA PO) Take 1 tablet by mouth daily.   11/17/2018 at Unknown time  .  Multiple Vitamins-Minerals (MULTIVITAMIN ADULT PO) Take 1 tablet by mouth.   11/17/2018 at Unknown time  . Omega-3 Fatty Acids (FISH OIL) 1000 MG CAPS Take 1 capsule by mouth daily.   11/17/2018 at Unknown time  . omeprazole (PRILOSEC) 20 MG capsule Take 20 mg by mouth 2 (two) times daily before a meal.    11/18/2018 at Unknown time  . thiamine (VITAMIN B-1) 100 MG tablet Take 100 mg by mouth daily.   11/17/2018 at Unknown time    Patient Stressors: Financial difficulties Marital or family conflict Occupational concerns  Patient Strengths: Ability for insight Active sense of humor Average or above average intelligence  Treatment Modalities: Medication Management, Group therapy, Case management,  1 to 1 session with clinician, Psychoeducation, Recreational therapy.   Physician Treatment Plan for Primary  Diagnosis: <principal problem not specified> Long Term Goal(s): Improvement in symptoms so as ready for discharge Improvement in symptoms so as ready for discharge   Short Term Goals: Ability to demonstrate self-control will improve Compliance with prescribed medications will improve  Medication Management: Evaluate patient's response, side effects, and tolerance of medication regimen.  Therapeutic Interventions: 1 to 1 sessions, Unit Group sessions and Medication administration.  Evaluation of Outcomes: Progressing  Physician Treatment Plan for Secondary Diagnosis: Active Problems:   MDD (major depressive disorder), recurrent, severe, with psychosis ([place])  Long Term Goal(s): Improvement in symptoms so as ready for discharge Improvement in symptoms so as ready for discharge   Short Term Goals: Ability to demonstrate self-control will improve Compliance with prescribed medications will improve     Medication Management: Evaluate patient's response, side effects, and tolerance of medication regimen.  Therapeutic Interventions: 1 to 1 sessions, Unit Group sessions and Medication administration.  Evaluation of Outcomes: Progressing   RN Treatment Plan for Primary Diagnosis: <principal problem not specified> Long Term Goal(s): Knowledge of disease and therapeutic regimen to maintain health will improve  Short Term Goals: Ability to remain free from injury will improve and Compliance with prescribed medications will improve  Medication Management: RN will administer medications as ordered by provider, will assess and evaluate patient's response and provide education to patient for prescribed medication. RN will report any adverse and/or side effects to prescribing provider.  Therapeutic Interventions: 1 on 1 counseling sessions, Psychoeducation, Medication administration, Evaluate responses to treatment, Monitor vital signs and CBGs as ordered, Perform/monitor CIWA, COWS, AIMS and Fall  Risk screenings as ordered, Perform wound care treatments as ordered.  Evaluation of Outcomes: Progressing   LCSW Treatment Plan for Primary Diagnosis: <principal problem not specified> Long Term Goal(s): Safe transition to appropriate next level of care at discharge, Engage patient in therapeutic group addressing interpersonal concerns.  Short Term Goals: Engage patient in aftercare planning with referrals and resources, Increase social support and Increase skills for wellness and recovery  Therapeutic Interventions: Assess for all discharge needs, 1 to 1 time with Social worker, Explore available resources and support systems, Assess for adequacy in community support network, Educate family and significant other(s) on suicide prevention, Complete Psychosocial Assessment, Interpersonal group therapy.  Evaluation of Outcomes: Progressing   Progress in Treatment: Attending groups: Yes. Participating in groups: Yes. Taking medication as prescribed: Yes. Toleration medication: Yes. Family/Significant other contact made: No, will contact:  Marquasha Brutus, daughter [phone] Patient understands diagnosis: Yes. Discussing patient identified problems/goals with staff: Yes. Medical problems stabilized or resolved: No. Denies suicidal/homicidal ideation: Yes. Issues/concerns per patient self-inventory: No. Other: None reported   New problem(s) identified: No, Describe:  None  reported  New Short Term/Long Term Goal(s):medication stabilization, elimination of SI thoughts, development of comprehensive mental wellness plan.   Patient Goals: coping skills for depression   Discharge Plan or Barriers: Patient plans to return home with her daughter. She plans to follow up with [facility] in [place]. CSW will continue to follow for any additional referrals or possible discharge planning.   Reason for Continuation of Hospitalization: Anxiety Medication  stabilization  Estimated Length of Stay: 11/27/2018  Attendees: Patient: Alicia Robinson 11/20/2018 12:21 PM  Physician: Johnn Hai, MD; Dr. Myles Lipps, MD 11/20/2018 12:21 PM  Nursing: Sena Hitch, RN; Legrand Como.Chauncey Cruel, RN 11/20/2018 12:21 PM  RN Care Manager: 11/20/2018 12:21 PM  Social Worker: Rockwell Germany, LCSW; Radonna Ricker, [place] 11/20/2018 12:21 PM  Recreational Therapist:  11/20/2018 12:21 PM  Other: Lawana Pai, MSW Intern 11/20/2018 12:21 PM  Other: Harriett Sine, NP  11/20/2018 12:21 PM  Other: 11/20/2018 12:21 PM    Scribe for Treatment Team: Lawana Pai, MSW Intern 11/20/2018 12:21 PM

## 2018-11-25 NOTE — BHH Group Notes (Signed)
Occupational Therapy Group Note  Date:  11/25/2018 Time:  11:23 AM  Group Topic/Focus:  Self Esteem Action Plan:   The focus of this group is to help patients create a plan to continue to build self-esteem after discharge.  Participation Level:  Active  Participation Quality:  Appropriate  Affect:  Blunted  Cognitive:  Appropriate  Insight: Improving  Engagement in Group:  Engaged  Modes of Intervention:  Activity, Discussion, Education and Socialization  Additional Comments:    S: "This was really beneficial, I took a lot from this"  O: OT tx with focus on self esteem building this date. Education given on definition of self esteem, with both causes of low and high self esteem identified. Activity given for pt to identify a positive/aspiring trait for each letter of the alphabet. Pt to work with peers to help complete activity and build positive thinking.   A: Pt presents with blunted affect, engaged and participatory throughout session. Pt very participatory in discussion of negative vs positive self esteem. A-Z activity then completed. Pt voicing much benefit from group.  P: OT group will be x1 per week while pt inpatient   Zenovia Jarred, MSOT, OTR/L [place] Office: [place] 11/25/2018, 11:23 AM

## 2018-11-25 NOTE — Plan of Care (Signed)
Progress note  D: pt found at the medication window; pt compliant with medication administration. Pt states she slept well. Pt rates her depression/hopelessness/anxiety an 8/8/8 out of 10 respectively. Pt denies any physical symptoms or pain, rating this a 0/10. Pt states her goal for today is to work on coping mechanisms and strategies, and will achieve this by reading her blue book. Pt denies si/hi/ah/vh and verbally agrees to approach staff if these become apparent or before harming herself/others while at [facility]. Pt is brighter than yesterday. A: pt provided support and encouragement. Pt given medication per protocol and standing orders. Q30m safety checks implemented and continued.  R: pt safe on the unit. Will continue to monitor.   Pt progressing in the following metrics Problem: Education: Goal: Ability to state activities that reduce stress will improve Outcome: Progressing   Problem: Self-Concept: Goal: Ability to identify factors that promote anxiety will improve Outcome: Progressing Goal: Ability to modify response to factors that promote anxiety will improve Outcome: Progressing   Problem: Education: Goal: Knowledge of [place] [facility] Education information/materials will improve Outcome: Progressing

## 2018-11-25 NOTE — Progress Notes (Signed)
Patient expressed in group that her positive event for the day was that she had a good talk with her mother. She also mentioned that she enjoyed going outside for fresh air with her peers. Her goal for tomorrow is to go home.

## 2018-11-25 NOTE — Progress Notes (Signed)
Alicia Robinson attended wrap-up group tonight. Pt appears anxious/flat in affect and mood. Pt denies SI/HI/AVH/Pain at this time. Pt states she hopes to go home tomorrow. Support provided. Will continue with POC.

## 2018-11-26 MED ORDER — MIRTAZAPINE 15 MG PO TABS: 15.0000 mg | ORAL_TABLET | Freq: Every day | ORAL | 0 refills | Status: DC

## 2018-11-26 MED ORDER — IBUPROFEN 400 MG PO TABS: 400.0000 mg | ORAL_TABLET | Freq: Four times a day (QID) | ORAL | 0 refills | Status: DC | PRN

## 2018-11-26 MED ORDER — BUPROPION HCL ER (XL) 300 MG PO TB24: 300.0000 mg | ORAL_TABLET | Freq: Every day | ORAL | 0 refills | Status: DC

## 2018-11-26 MED ORDER — ACETAMINOPHEN 325 MG PO TABS: 650.0000 mg | ORAL_TABLET | Freq: Four times a day (QID) | ORAL | Status: DC | PRN

## 2018-11-26 MED ORDER — PERPHENAZINE 4 MG PO TABS: 4.0000 mg | ORAL_TABLET | Freq: Two times a day (BID) | ORAL | 0 refills | Status: DC

## 2018-11-26 NOTE — BHH Group Notes (Signed)
[facility] Mental Health Association Group Therapy      11/26/2018 1:34 PM  Type of Therapy: Mental Health Association Presentation  Participation Level: Active  Participation Quality: Attentive  Affect: Appropriate  Cognitive: Oriented  Insight: Developing/Improving  Engagement in Therapy: Engaged  Modes of Intervention: Discussion, Education and Socialization  Summary of Progress/Problems: Alicia ([place]) Speaker came to talk about his personal journey with mental health. The pt processed ways by which to relate to the speaker. [place] speaker provided handouts and educational information pertaining to groups and services offered by the [facility]. Pt was engaged in speaker's presentation and was receptive to resources provided.    [place] Social Worker

## 2018-11-26 NOTE — BHH Suicide Risk Assessment (Signed)
[facility] Discharge Suicide Risk Assessment   Principal Problem: <principal problem not specified> Discharge Diagnoses: Active Problems:   MDD (major depressive disorder), recurrent, severe, with psychosis ([place])   Total Time spent with patient: 15 minutes  Musculoskeletal: Strength & Muscle Tone: within normal limits Gait & Station: normal Patient leans: N/A  Psychiatric Specialty Exam: Review of Systems  All other systems reviewed and are negative.   Blood pressure 131/81, pulse 96, temperature 97.8 F (36.6 C), temperature source Oral, resp. rate 18, last menstrual period 11/18/2018.There is no height or weight on file to calculate BMI.  General Appearance: Casual  Eye Contact::  Good  Speech:  Normal Rate409  Volume:  Normal  Mood:  Euthymic  Affect:  Congruent  Thought Process:  Coherent and Descriptions of Associations: Intact  Orientation:  Full (Time, Place, and Person)  Thought Content:  Logical  Suicidal Thoughts:  No  Homicidal Thoughts:  No  Memory:  Immediate;   Fair Recent;   Fair Remote;   Fair  Judgement:  Intact  Insight:  Fair  Psychomotor Activity:  Normal  Concentration:  Fair  Recall:  [employer] of Knowledge:Fair  Language: Good  Akathisia:  Negative  Handed:  Right  AIMS (if indicated):     Assets:  Communication Skills Desire for Improvement Housing Physical Health Resilience Social Support  Sleep:  Number of Hours: 6.5  Cognition: WNL  ADL's:  Intact   Mental Status Per Nursing Assessment::   On Admission:  Self-harm thoughts  Demographic Factors:  Low socioeconomic status  Loss Factors: Financial problems/change in socioeconomic status  Historical Factors: Impulsivity  Risk Reduction Factors:   Sense of responsibility to family, Living with another person, especially a relative and Positive social support  Continued Clinical Symptoms:  Depression:   Comorbid alcohol abuse/dependence Impulsivity Alcohol/Substance  Abuse/Dependencies  Cognitive Features That Contribute To Risk:  None    Suicide Risk:  Minimal: No identifiable suicidal ideation.  Patients presenting with no risk factors but with morbid ruminations; may be classified as minimal risk based on the severity of the depressive symptoms  Follow-up [facility] in 3 day(s).   Why:  Please attend a walk in appt Monday-Friday, 8:30am-12noon or 2pm-330pm.  Please request therapy and medication management services.  Contact information: [address] [place] [address] [phone]           Plan Of Care/Follow-up recommendations:  Activity:  ad lib  Sharma Covert, MD 11/26/2018, 11:06 AM

## 2018-11-26 NOTE — Progress Notes (Signed)
  [facility] Adult Case Management Discharge Plan :  Will you be returning to the same living situation after discharge:  No, patient reports she is discharging home with her daughter in [place], [place].  At discharge, do you have transportation home?: Yes,  patient reports she is taking an [place] Popper to her daughter's home Do you have the ability to pay for your medications: Yes,  BCBS  Release of information consent forms completed and in the chart;  Patient's signature needed at discharge.  Patient to Follow up at: Follow-up Information    [facility] Follow up on 12/01/2018.   Specialty:  Behavioral Health Why:  Therapy appointment with Clement Husbands is Tuesday, 3/17 at 10:00a. Medication management appointment with Dr. Adele Schilder is Wednesday, 3/18 at 12:00p. Please bring your photo ID, proof of insurance, SSN, current medications and discharge paperwork.    Contact information: [place] [address] [place] [phone]          Next level of care provider has access to [place] and Suicide Prevention discussed: Yes,  with the patient   Have you used any form of tobacco in the last 30 days? (Cigarettes, Smokeless Tobacco, Cigars, and/or Pipes): No  Has patient been referred to the Quitline?: N/A patient is not a smoker  Patient has been referred for addiction treatment: [place], [place] 11/26/2018, 12:00 PM

## 2018-11-26 NOTE — Progress Notes (Signed)
Patient ID: Alicia Robinson, female   DOB: April 11, 1976, 43 y.o.   MRN: [id number]  Discharge Note  Patient denies SI/HI and states readiness for discharge.  Written and verbal discharge instructions reviewed with the patient. Patient accepting to information and verbalized understanding with no concerns. All belongings returned to patient from the unit and secured lockers. Patient has completed their Suicide Safety Plan and has been provided Suicide Prevention resources. Patient provided an opportunity to complete and return Patient Satisfaction Survey.   Patient was safely escorted to the lobby for discharge. Patient discharged from [facility] with  Prescriptions sent to pharmacy, personal belongings, follow-up appointment in place and discharge instructions.

## 2018-11-26 NOTE — Progress Notes (Signed)
Patient ID: Alicia Robinson, female   DOB: 09-12-1976, 43 y.o.   MRN: [id number]  Nursing Progress Note [phone]  On initial approach, patient is seen up in the milieu interacting with peers. Patient presents calm, pleasant and cooperative. Patient compliant with scheduled medications and requests PRN Ativan with morning medications. Patient is seen attending groups and visible in the milieu. Patient currently denies SI/HI/AVH. Patient reports she is going to discharge today.  Patient is educated about and provided medication per provider's orders. Patient safety maintained with q15 min safety checks and low fall risk precautions. Emotional support given, 1:1 interaction, and active listening provided. Patient encouraged to attend meals, groups, and work on treatment plan and goals. Labs, vital signs and patient behavior monitored throughout shift.   Patient contracts for safety with staff. Patient remains safe on the unit at this time and agrees to come to staff with any issues/concerns. Patient is interacting with peers appropriately on the unit. Will continue to support and monitor.   Patient's self-inventory sheet Rated Energy Level  Low  Rated Sleep  Fair  Rated Appetite  Good  Rated Anxiety (0-10)  5  Rated Hopelessness (0-10)  7  Rated Depression (0-10)  10  Daily Goal  "go home"  Any Additional Comments:

## 2018-11-26 NOTE — Progress Notes (Signed)
[facility] MD Progress Note  11/26/2018 7:22 AM Alicia Robinson  MRN:  [id number] Subjective: This note is for patient examination on 11/25/2018, it is being dictated on 11/26/2018.  Patient is a 43 year old female with a past psychiatric history significant for depression and psychosis. She presented to the [facility] emergency department on 11/18/2018 with a friend. The patient was initially a verbal on admission. Her initial complaint was "I am confused, my head hurts". She carried a diagnosis of cannabis dependency and depression with recurrent psychosis in the past.  Objective: Patient is seen and examined.  Patient is a 43 year old female with the above-stated past psychiatric history is seen in follow-up.  She stated that she did not have a good day yesterday, but felt better today.  She had the Remeron re-added after she had been reportedly having more irritability.  Review of nursing notes revealed that the patient had endorsed more depressive and anxiety symptoms last night.  She stated that she felt a little bit better this a.m., but had not had enough of the day to decide whether or not she was doing well.  She did admit that she had less suicidal thoughts today than yesterday.  She did state that she felt as though she was less irritable.  Her vital signs are stable, she is afebrile.  She slept over 6 hours last p.m.   Principal Problem: <principal problem not specified> Diagnosis: Active Problems:   MDD (major depressive disorder), recurrent, severe, with psychosis ([place])  Total Time spent with patient: 15 minutes  Past Psychiatric History: See admission H&P  Past Medical History:  Past Medical History:  Diagnosis Date  . Depression   . Hypertension   . Suicidal overdose [facility])     Past Surgical History:  Procedure Laterality Date  . CHOLECYSTECTOMY    . TUBAL LIGATION     Family History: History reviewed. No pertinent family history. Family Psychiatric  History:  See admission H&P Social History:  Social History   Substance and Sexual Activity  Alcohol Use Yes   Comment: weekends     Social History   Substance and Sexual Activity  Drug Use Yes  . Types: Marijuana    Social History   Socioeconomic History  . Marital status: Single    Spouse name: Not on file  . Number of children: Not on file  . Years of education: Not on file  . Highest education level: Not on file  Occupational History  . Not on file  Social Needs  . Financial resource strain: Not on file  . Food insecurity:    Worry: Not on file    Inability: Not on file  . Transportation needs:    Medical: Not on file    Non-medical: Not on file  Tobacco Use  . Smoking status: Never Smoker  . Smokeless tobacco: Never Used  Substance and Sexual Activity  . Alcohol use: Yes    Comment: weekends  . Drug use: Yes    Types: Marijuana  . Sexual activity: Yes  Lifestyle  . Physical activity:    Days per week: Not on file    Minutes per session: Not on file  . Stress: Not on file  Relationships  . Social connections:    Talks on phone: Not on file    Gets together: Not on file    Attends religious service: Not on file    Active member of club or organization: Not on file  Attends meetings of clubs or organizations: Not on file    Relationship status: Not on file  Other Topics Concern  . Not on file  Social History Narrative  . Not on file   Additional Social History:                         Sleep: Good  Appetite:  Good  Current Medications: Current Facility-Administered Medications  Medication Dose Route Frequency Provider Last Rate Last Dose  . acetaminophen (TYLENOL) tablet 650 mg  650 mg Oral Q6H PRN Ethelene Hal, NP   650 mg at 11/24/18 0526  . alum & mag hydroxide-simeth (MAALOX/MYLANTA) 200-200-20 MG/5ML suspension 30 mL  30 mL Oral Q4H PRN Ethelene Hal, NP      . aspirin EC tablet 81 mg  81 mg Oral Daily Ethelene Hal, NP   81 mg at 11/25/18 0749  . atorvastatin (LIPITOR) tablet 20 mg  20 mg Oral Daily Ethelene Hal, NP   20 mg at 11/25/18 0749  . buPROPion (WELLBUTRIN XL) 24 hr tablet 300 mg  300 mg Oral Daily Sharma Covert, MD      . hydrochlorothiazide (HYDRODIURIL) tablet 25 mg  25 mg Oral Daily Ethelene Hal, NP   25 mg at 11/25/18 0749  . ibuprofen (ADVIL,MOTRIN) tablet 400 mg  400 mg Oral Q6H PRN Sharma Covert, MD   400 mg at 11/25/18 0920  . LORazepam (ATIVAN) tablet 0.5 mg  0.5 mg Oral Q6H PRN Cobos, Myer Peer, MD   0.5 mg at 11/25/18 2102  . magnesium hydroxide (MILK OF MAGNESIA) suspension 30 mL  30 mL Oral Daily PRN Ethelene Hal, NP      . mirtazapine (REMERON) tablet 15 mg  15 mg Oral QHS Sharma Covert, MD   15 mg at 11/25/18 2102  . ondansetron (ZOFRAN) tablet 4 mg  4 mg Oral Q8H PRN Ethelene Hal, NP      . pantoprazole (PROTONIX) EC tablet 40 mg  40 mg Oral Daily Cobos, Myer Peer, MD   40 mg at 11/23/18 1754  . perphenazine (TRILAFON) tablet 4 mg  4 mg Oral BID Cobos, Myer Peer, MD   4 mg at 11/25/18 1659  . traZODone (DESYREL) tablet 50 mg  50 mg Oral QHS PRN Rozetta Nunnery, NP        Lab Results: No results found for this or any previous visit (from the past 48 hour(s)).  Blood Alcohol level:  Lab Results  Component Value Date   ETH <10 11/18/2018   ETH <10 71/69/6789    Metabolic Disorder Labs: Lab Results  Component Value Date   HGBA1C 5.4 11/19/2018   MPG 108.28 11/19/2018   MPG 105.41 09/22/2017   No results found for: PROLACTIN Lab Results  Component Value Date   CHOL 134 11/19/2018   TRIG 81 11/19/2018   HDL 41 11/19/2018   CHOLHDL 3.3 11/19/2018   VLDL 16 11/19/2018   LDLCALC 77 11/19/2018   LDLCALC 189 (H) 09/22/2017    Physical Findings: AIMS:  , ,  ,  ,    CIWA:    COWS:     Musculoskeletal: Strength & Muscle Tone: within normal limits Gait & Station: normal Patient leans: N/A  Psychiatric  Specialty Exam: Physical Exam  Constitutional: She is oriented to person, place, and time. She appears well-developed and well-nourished.  HENT:  Head: Normocephalic and atraumatic.  Respiratory:  Effort normal.  Neurological: She is alert and oriented to person, place, and time.    ROS  Blood pressure (!) 123/93, pulse 94, temperature 97.8 F (36.6 C), temperature source Oral, resp. rate 18, last menstrual period 11/18/2018.There is no height or weight on file to calculate BMI.  General Appearance: Casual  Eye Contact:  Fair  Speech:  Normal Rate  Volume:  Normal  Mood:  Anxious  Affect:  Congruent  Thought Process:  Coherent and Descriptions of Associations: Intact  Orientation:  Full (Time, Place, and Person)  Thought Content:  Logical  Suicidal Thoughts:  No  Homicidal Thoughts:  No  Memory:  Immediate;   Fair Recent;   Fair Remote;   Fair  Judgement:  Intact  Insight:  Fair  Psychomotor Activity:  Increased  Concentration:  Concentration: Fair and Attention Span: Fair  Recall:  [employer] of Knowledge:  Fair  Language:  Good  Akathisia:  Negative  Handed:  Right  AIMS (if indicated):     Assets:  Communication Skills Desire for Improvement Housing Leisure Time Physical Health Resilience  ADL's:  Intact  Cognition:  WNL  Sleep:  Number of Hours: 6.5     Treatment Plan Summary: Daily contact with patient to assess and evaluate symptoms and progress in treatment, Medication management and Plan : Patient is seen and examined.  Patient is a 43 year old female with the above-stated past psychiatric history who is seen in follow-up.  Ports doing better today than yesterday.  It still early in the day and she wants to see what goes on.  Her sleep is better and her irritability has decreased.  No change in her current medications.  Hopefully this will continue. 1.  Continue Remeron at 15 mg p.o. nightly for sleep, mood and irritability 2.  Continue Wellbutrin XL 150 mg  p.o. daily for mood and anxiety. 3.  Continue Lipitor 20 mg p.o. daily for hyperlipidemia. 4.  Continue hydrochlorothiazide 25 mg p.o. daily for hypertension. 5.  Continue hydroxyzine 25 mg p.o. 3 times daily as needed anxiety. 6.  Continue Zofran 4 mg p.o. every 8 hours as needed nausea. 7.  Continue Trilafon 4 mg p.o. twice daily for psychosis. 8.  Continue coated aspirin 81 mg p.o. daily for elevated platelets. 9.  Disposition planning-in progress.  Sharma Covert, MD 11/26/2018, 7:22 AM

## 2018-11-26 NOTE — Discharge Summary (Signed)
Physician Discharge Summary Note  Patient:  Alicia Robinson is an 43 y.o., female MRN:  [id number] DOB:  [birth year] Patient phone:  [phone] (home)  Patient address:   [place] [address],  Total Time spent with patient: 20 minutes  Date of Admission:  11/19/2018 Date of Discharge: 11/26/18  Reason for Admission:  Psychosis and disorganized state  Principal Problem: MDD (major depressive disorder), recurrent, severe, with psychosis ([place]) Discharge Diagnoses: Principal Problem:   MDD (major depressive disorder), recurrent, severe, with psychosis ([place])   Past Psychiatric History: Extensive with multiple self-harm attempts  Past Medical History:  Past Medical History:  Diagnosis Date  . Depression   . Hypertension   . Suicidal overdose [facility])     Past Surgical History:  Procedure Laterality Date  . CHOLECYSTECTOMY    . TUBAL LIGATION     Family History: History reviewed. No pertinent family history. Family Psychiatric  History: Denies Social History:  Social History   Substance and Sexual Activity  Alcohol Use Yes   Comment: weekends     Social History   Substance and Sexual Activity  Drug Use Yes  . Types: Marijuana    Social History   Socioeconomic History  . Marital status: Single    Spouse name: Not on file  . Number of children: Not on file  . Years of education: Not on file  . Highest education level: Not on file  Occupational History  . Not on file  Social Needs  . Financial resource strain: Not on file  . Food insecurity:    Worry: Not on file    Inability: Not on file  . Transportation needs:    Medical: Not on file    Non-medical: Not on file  Tobacco Use  . Smoking status: Never Smoker  . Smokeless tobacco: Never Used  Substance and Sexual Activity  . Alcohol use: Yes    Comment: weekends  . Drug use: Yes    Types: Marijuana  . Sexual activity: Yes  Lifestyle  . Physical activity:    Days per week: Not on file     Minutes per session: Not on file  . Stress: Not on file  Relationships  . Social connections:    Talks on phone: Not on file    Gets together: Not on file    Attends religious service: Not on file    Active member of club or organization: Not on file    Attends meetings of clubs or organizations: Not on file    Relationship status: Not on file  Other Topics Concern  . Not on file  Social History Narrative  . Not on file    Hospital Course:   11/19/18 [facility] MD Assessment: This is the 6 psychiatric mission overall for [name] is a 43 year old patient who has a diagnosis of cannabis dependency and depression with recurrent psychosis, she presented this time in the most disorganized state to date when compared to prior presentations, she was regressed and tearful, confused and poorly cooperative was nonverbal for period of time. Further the patient is reporting visual hallucinations for the first time, seeing a clown in front of her telling her to commit suicide and she identifies him as "Penny-Wise" from a movie Despite her regression, near mutism and disorganized thoughts on presentation 3/4 today she is more coherent and organized able to articulate that she is in fact depressed that previous antidepressants have helped briefly but cannot recall one that  had sustained benefit, reporting the visual hallucination recently but not today, reporting continued thoughts of not wanting to be here thus passive suicidal thoughts without plans or intent. Her last hospitalization was in January 2019, at that point she had overdosed on lamotrigine and required medical clearance.  She was engage in cognitive-based therapy and started on venlafaxine for depressive symptoms but it was a low dose which she remains on. The present time again she is alert oriented to person place time and situation affect appropriate for the situation nonaggressive non-tearful but affect constricted no thoughts of harming others  but thoughts of not wanting to be here as mentioned recent but no current visual and auditory hallucinations no visual hallucinations at present.  Patient remained on the [facility] unit for 7 days. The patient stabilized on medication and therapy. Patient was discharged on Wellbutrin XL 300 mg Daily, HCTZ 25 mg Daily, Remeron 15 mg QHS, Protonix 40 mg Daily, Trilafon 4 mg BID, Trazodone 50 mg QHS PRN. Patient has shown improvement with improved mood, affect, sleep, appetite, and interaction. Patient has attended group and participated. Patient has been seen in the day room interacting with peers and staff appropriately. Patient denies any SI/HI/AVH and contracts for safety. Patient agrees to follow up at [facility]. Patient is provided with prescriptions for their medications upon discharge.   Physical Findings: AIMS:  , ,  ,  ,    CIWA:    COWS:     Musculoskeletal: Strength & Muscle Tone: within normal limits Gait & Station: normal Patient leans: N/A  Psychiatric Specialty Exam: See MD Discharge SRA Physical Exam  ROS  Blood pressure (!) 136/98, pulse 89, temperature 97.8 F (36.6 C), temperature source Oral, resp. rate 18, last menstrual period 11/18/2018, SpO2 100 %.There is no height or weight on file to calculate BMI.  Sleep:  Number of Hours: 6.5     Have you used any form of tobacco in the last 30 days? (Cigarettes, Smokeless Tobacco, Cigars, and/or Pipes): No  Has this patient used any form of tobacco in the last 30 days? (Cigarettes, Smokeless Tobacco, Cigars, and/or Pipes) Yes, No  Blood Alcohol level:  Lab Results  Component Value Date   ETH <10 11/18/2018   ETH <10 62/70/3500    Metabolic Disorder Labs:  Lab Results  Component Value Date   HGBA1C 5.4 11/19/2018   MPG 108.28 11/19/2018   MPG 105.41 09/22/2017   No results found for: PROLACTIN Lab Results  Component Value Date   CHOL 134 11/19/2018   TRIG 81 11/19/2018   HDL 41  11/19/2018   CHOLHDL 3.3 11/19/2018   VLDL 16 11/19/2018   LDLCALC 77 11/19/2018   LDLCALC 189 (H) 09/22/2017    See Psychiatric Specialty Exam and Suicide Risk Assessment completed by Attending Physician prior to discharge.  Discharge destination:  Home  Is patient on multiple antipsychotic therapies at discharge:  No   Has Patient had three or more failed trials of antipsychotic monotherapy by history:  No  Recommended Plan for Multiple Antipsychotic Therapies: NA  Discharge Instructions    Discharge instructions   Complete by:  As directed    Patient is instructed to take all prescribed medications as recommended. Report any side effects or adverse reactions to your outpatient psychiatrist. Patient is instructed to abstain from alcohol and illegal drugs while on prescription medications. In the event of worsening symptoms, patient is instructed to call the crisis hotline, 911, or go to the  nearest emergency department for evaluation and treatment.     Allergies as of 11/26/2018   No Known Allergies     Medication List    STOP taking these medications   cholecalciferol 25 MCG (1000 UT) tablet Commonly known as:  VITAMIN D3   Fish Oil 1000 MG Caps   fluconazole 150 MG tablet Commonly known as:  DIFLUCAN   GARLIC PO   thiamine 454 MG tablet Commonly known as:  VITAMIN B-1   vitamin C 100 MG tablet     TAKE these medications     Indication  acetaminophen 325 MG tablet Commonly known as:  TYLENOL Take 2 tablets (650 mg total) by mouth every 6 (six) hours as needed for mild pain.  Indication:  Pain   aspirin EC 81 MG tablet Take 1 tablet (81 mg total) by mouth daily. For heart health  Indication:  Heart health   atorvastatin 20 MG tablet Commonly known as:  LIPITOR Take 1 tablet (20 mg total) by mouth daily. For high cholesterol  Indication:  Inherited Heterozygous Hypercholesterolemia, High Amount of Fats in the Blood   buPROPion 300 MG 24 hr  tablet Commonly known as:  WELLBUTRIN XL Take 1 tablet (300 mg total) by mouth daily. For mood Start taking on:  November 27, 2018  Indication:  Mood   hydrochlorothiazide 25 MG tablet Commonly known as:  HYDRODIURIL Take 1 tablet (25 mg total) by mouth daily. For high blood pressure  Indication:  High Blood Pressure Disorder, hypertension   ibuprofen 400 MG tablet Commonly known as:  ADVIL,MOTRIN Take 1 tablet (400 mg total) by mouth every 6 (six) hours as needed for fever, headache or mild pain.  Indication:  Pain   mirtazapine 15 MG tablet Commonly known as:  REMERON Take 1 tablet (15 mg total) by mouth at bedtime. For mood  Indication:  Mood   MULTIVITAMIN ADULT PO Take 1 tablet by mouth. What changed:  Another medication with the same name was removed. Continue taking this medication, and follow the directions you see here.  Indication:  Supplementation   omeprazole 20 MG capsule Commonly known as:  PRILOSEC Take 20 mg by mouth 2 (two) times daily before a meal.  Indication:  Gastroesophageal Reflux Disease   perphenazine 4 MG tablet Commonly known as:  TRILAFON Take 1 tablet (4 mg total) by mouth 2 (two) times daily. For psychosis  Indication:  Psychosis      Follow-up Information    [facility] .   Specialty:  [facility] information: [place] [phone]          Follow-up recommendations:  Continue activity as tolerated. Continue diet as recommended by your PCP. Ensure to keep all appointments with outpatient providers.  Comments:  Patient is instructed prior to discharge to: Take all medications as prescribed by his/her mental healthcare provider. Report any adverse effects and or reactions from the medicines to his/her outpatient provider promptly. Patient has been instructed & cautioned: To not engage in alcohol and or illegal drug use while on  prescription medicines. In the event of worsening symptoms, patient is instructed to call the crisis hotline, 911 and or go to the nearest ED for appropriate evaluation and treatment of symptoms. To follow-up with his/her primary care provider for your other medical issues, concerns and or health care needs.    Signed: Lowry Ram Yurika Pereda, FNP 11/26/2018, 11:43 AM

## 2018-12-01 ENCOUNTER — Ambulatory Visit (HOSPITAL_COMMUNITY): Payer: BLUE CROSS/BLUE SHIELD | Admitting: Psychiatry

## 2018-12-02 ENCOUNTER — Ambulatory Visit (HOSPITAL_COMMUNITY): Payer: BLUE CROSS/BLUE SHIELD | Admitting: Psychiatry

## 2019-04-01 ENCOUNTER — Ambulatory Visit: Payer: BLUE CROSS/BLUE SHIELD | Attending: Family Medicine | Admitting: Family Medicine

## 2019-04-01 ENCOUNTER — Other Ambulatory Visit: Payer: Self-pay

## 2019-04-01 ENCOUNTER — Encounter: Payer: Self-pay | Admitting: Family Medicine

## 2019-04-01 VITALS — BP 120/82 | HR 83 | Ht 70.0 in | Wt 194.0 lb

## 2019-04-01 DIAGNOSIS — I10 Essential (primary) hypertension: Secondary | ICD-10-CM | POA: Diagnosis not present

## 2019-04-01 DIAGNOSIS — M79672 Pain in left foot: Secondary | ICD-10-CM

## 2019-04-01 DIAGNOSIS — M25561 Pain in right knee: Secondary | ICD-10-CM

## 2019-04-01 DIAGNOSIS — Z1239 Encounter for other screening for malignant neoplasm of breast: Secondary | ICD-10-CM

## 2019-04-01 DIAGNOSIS — G8929 Other chronic pain: Secondary | ICD-10-CM | POA: Diagnosis not present

## 2019-04-01 MED ORDER — DICLOFENAC SODIUM 1 % TD GEL: 4.0000 g | Freq: Four times a day (QID) | TRANSDERMAL | 3 refills | Status: DC

## 2019-04-01 MED ORDER — HYDROCHLOROTHIAZIDE 25 MG PO TABS: 25.0000 mg | ORAL_TABLET | Freq: Every day | ORAL | 0 refills | Status: DC

## 2019-04-01 NOTE — Progress Notes (Signed)
New Patient Office Visit  Subjective:  Patient ID: Alicia Robinson, female    DOB: 1976-01-01  Age: 43 y.o. MRN: [id number]  CC:  Chief Complaint  Patient presents with  . New Patient (Initial Visit)    HPI Alicia Robinson presents for to establish care and reports that she has chronic pain in her right knee and left bottom on her foot/heel and she wonders if she still needs to take her blood pressure medication. She has had no headaches or dizziness related to her blood pressure.  She also wants to have her mammogram scheduled as she thinks her last one was in 2018. She has found no abnormalities on self exam. She does not recall any specific injury to her right knee or her left heel. She thinks that her heel started hurting because she was walking abnormally at times and putting more weight on her left foot. She has a sharp stabbing pain in the bottom of her left heel but only when she is standing/walking. She denies any pain that tends to worsen when she first puts weight on her feet and then improves. Pain in her heel ranges from about a 4-6. Her knee pain is generally  increased  once she has been sitting or still and then gets up. Pain can be sharp, sometimes slightly burning. Can be between a 4-8. Pain worse with going up and down stairs.   Past Medical History:  Diagnosis Date  . Depression   . Hypertension   . Suicidal overdose [facility])     Past Surgical History:  Procedure Laterality Date  . CHOLECYSTECTOMY    . TUBAL LIGATION      Family History  Problem Relation Age of Onset  . Hypertension Mother   . Diabetes Mother   . Hypertension Father   . Diabetes Father   . Diabetes Maternal Grandmother   . Hypertension Maternal Grandmother     Social History   Tobacco Use  . Smoking status: Never Smoker  . Smokeless tobacco: Never Used  Substance Use Topics  . Alcohol use: Yes    Comment: weekends  . Drug use: Yes    Types: Marijuana    ROS Review of Systems    Constitutional: Negative for chills and fever.  HENT: Negative for sore throat and trouble swallowing.   Respiratory: Negative for cough and shortness of breath.   Cardiovascular: Negative for chest pain, palpitations and leg swelling.  Gastrointestinal: Negative for abdominal pain, constipation, diarrhea and nausea.  Endocrine: Negative for cold intolerance, heat intolerance, polydipsia, polyphagia and polyuria.  Genitourinary: Negative for dysuria and frequency.  Musculoskeletal: Positive for arthralgias and gait problem (due to right knee and left heel pain). Negative for back pain.  Neurological: Negative for dizziness and headaches.  Hematological: Negative for adenopathy. Does not bruise/bleed easily.    Objective:   Today's Vitals: BP 120/82   Pulse 83   Ht 5\' 10"  (1.778 m)   Wt 194 lb (88 kg)   SpO2 99%   BMI 27.84 kg/m   Physical Exam Vitals signs and nursing note reviewed.  Constitutional:      Appearance: Normal appearance.  Neck:     Musculoskeletal: Normal range of motion and neck supple.  Cardiovascular:     Rate and Rhythm: Normal rate and regular rhythm.  Pulmonary:     Effort: Pulmonary effort is normal.     Breath sounds: Normal breath sounds.  Abdominal:     General: There is no  distension.     Palpations: Abdomen is soft.     Tenderness: There is no abdominal tenderness. There is no right CVA tenderness, left CVA tenderness, guarding or rebound.  Musculoskeletal:        General: Tenderness (tenderness right medical knee jointline tenderness; tender plantar surface of left mid-heel) present.     Right lower leg: No edema.     Left lower leg: No edema.  Lymphadenopathy:     Cervical: No cervical adenopathy.  Skin:    General: Skin is warm and dry.  Neurological:     General: No focal deficit present.     Mental Status: She is alert and oriented to person, place, and time.  Psychiatric:        Mood and Affect: Mood normal.        Behavior: Behavior  normal.     Assessment & Plan:  1. Essential hypertension Patient reports that she needs refill of her blood pressure medicine if she is to continue taking it.  Prescription sent to patient's pharmacy for hydrochlorothiazide 25 mg which is on her medication list.  Patient reports a past history of CVA in [place].  Unfortunately there is no information in her medical records regarding this and patient was told to continue her blood pressure medication at this time and to try and find out information so that her prior records could be obtained prior to discontinuing the medication.  Information on hypertension and DASH diet provided as part of after visit summary - hydrochlorothiazide (HYDRODIURIL) 25 MG tablet; Take 1 tablet (25 mg total) by mouth daily. For high blood pressure  Dispense: 90 tablet; Refill: 0  2. Chronic pain of right knee Based on her symptoms and the location of her pain, discussed with patient that her pain is likely related to osteoarthritis or possible meniscal injury though osteoarthritis is more likely.  Prescription provided for Voltaren gel the patient may use up to 4 times daily as needed for pain.  If there is no improvement in her pain, she may need x-rays and possible knee injection to help with pain as well as further imaging. - diclofenac sodium (VOLTAREN) 1 % GEL; Apply 4 g topically 4 (four) times daily. As needed for joint pain  Dispense: 150 g; Refill: 3  3. Pain of left heel Discussed with patient that she may a heel spur as the cause of her pain.  - diclofenac sodium (VOLTAREN) 1 % GEL; Apply 4 g topically 4 (four) times daily. As needed for joint pain  Dispense: 150 g; Refill: 3  4. Screening for breast cancer Order placed for screening mammogram - MM Digital Screening; Future  Outpatient Encounter Medications as of 04/01/2019  Medication Sig  . acetaminophen (TYLENOL) 325 MG tablet Take 2 tablets (650 mg total) by mouth every 6  (six) hours as needed for mild pain.  Marland Kitchen aspirin EC 81 MG tablet Take 1 tablet (81 mg total) by mouth daily. For heart health  . atorvastatin (LIPITOR) 20 MG tablet Take 1 tablet (20 mg total) by mouth daily. For high cholesterol  . buPROPion (WELLBUTRIN XL) 300 MG 24 hr tablet Take 1 tablet (300 mg total) by mouth daily. For mood  . doxepin (SINEQUAN) 10 MG capsule   . hydrochlorothiazide (HYDRODIURIL) 25 MG tablet Take 1 tablet (25 mg total) by mouth daily. For high blood pressure  . ibuprofen (ADVIL,MOTRIN) 400 MG tablet Take 1 tablet (400 mg total) by mouth  every 6 (six) hours as needed for fever, headache or mild pain.  . Multiple Vitamins-Minerals (MULTIVITAMIN ADULT PO) Take 1 tablet by mouth.  Marland Kitchen omeprazole (PRILOSEC) 20 MG capsule Take 20 mg by mouth 2 (two) times daily before a meal.   . [DISCONTINUED] hydrochlorothiazide (HYDRODIURIL) 25 MG tablet Take 1 tablet (25 mg total) by mouth daily. For high blood pressure  . diclofenac sodium (VOLTAREN) 1 % GEL Apply 4 g topically 4 (four) times daily. As needed for joint pain  . mirtazapine (REMERON) 15 MG tablet Take 1 tablet (15 mg total) by mouth at bedtime. For mood (Patient not taking: Reported on 04/01/2019)  . perphenazine (TRILAFON) 4 MG tablet Take 1 tablet (4 mg total) by mouth 2 (two) times daily. For psychosis (Patient not taking: Reported on 04/01/2019)   No facility-administered encounter medications on file as of 04/01/2019.    An After Visit Summary was printed and given to the patient.  Follow-up: Return in about 3 months (around 07/02/2019) for HTN; schedule yearly well exam at your convenience.  Antony Blackbird, MD

## 2019-04-01 NOTE — Progress Notes (Signed)
Patient is having pain in right knee and bottom of left foot.

## 2019-04-01 NOTE — Patient Instructions (Signed)
DASH Eating Plan DASH stands for "Dietary Approaches to Stop Hypertension." The DASH eating plan is a healthy eating plan that has been shown to reduce high blood pressure (hypertension). It may also reduce your risk for type 2 diabetes, heart disease, and stroke. The DASH eating plan may also help with weight loss. What are tips for following this plan?  General guidelines  Avoid eating more than 2,300 mg (milligrams) of salt (sodium) a day. If you have hypertension, you may need to reduce your sodium intake to 1,500 mg a day.  Limit alcohol intake to no more than 1 drink a day for nonpregnant women and 2 drinks a day for men. One drink equals 12 oz of beer, 5 oz of wine, or 1 oz of hard liquor.  Work with your health care provider to maintain a healthy body weight or to lose weight. Ask what an ideal weight is for you.  Get at least 30 minutes of exercise that causes your heart to beat faster (aerobic exercise) most days of the week. Activities may include walking, swimming, or biking.  Work with your health care provider or diet and nutrition specialist (dietitian) to adjust your eating plan to your individual calorie needs. Reading food labels   Check food labels for the amount of sodium per serving. Choose foods with less than 5 percent of the Daily Value of sodium. Generally, foods with less than 300 mg of sodium per serving fit into this eating plan.  To find whole grains, look for the word "whole" as the first word in the ingredient list. Shopping  Buy products labeled as "low-sodium" or "no salt added."  Buy fresh foods. Avoid canned foods and premade or frozen meals. Cooking  Avoid adding salt when cooking. Use salt-free seasonings or herbs instead of table salt or sea salt. Check with your health care provider or pharmacist before using salt substitutes.  Do not fry foods. Cook foods using healthy methods such as baking, boiling, grilling, and broiling instead.  Cook with  heart-healthy oils, such as olive, canola, soybean, or sunflower oil. Meal planning  Eat a balanced diet that includes: ? 5 or more servings of fruits and vegetables each day. At each meal, try to fill half of your plate with fruits and vegetables. ? Up to 6-8 servings of whole grains each day. ? Less than 6 oz of lean meat, poultry, or fish each day. A 3-oz serving of meat is about the same size as a deck of cards. One egg equals 1 oz. ? 2 servings of low-fat dairy each day. ? A serving of nuts, seeds, or beans 5 times each week. ? Heart-healthy fats. Healthy fats called Omega-3 fatty acids are found in foods such as flaxseeds and coldwater fish, like sardines, salmon, and mackerel.  Limit how much you eat of the following: ? Canned or prepackaged foods. ? Food that is high in trans fat, such as fried foods. ? Food that is high in saturated fat, such as fatty meat. ? Sweets, desserts, sugary drinks, and other foods with added sugar. ? Full-fat dairy products.  Do not salt foods before eating.  Try to eat at least 2 vegetarian meals each week.  Eat more home-cooked food and less restaurant, buffet, and fast food.  When eating at a restaurant, ask that your food be prepared with less salt or no salt, if possible. What foods are recommended? The items listed may not be a complete list. Talk with your dietitian about   what dietary choices are best for you. Grains Whole-grain or whole-wheat bread. Whole-grain or whole-wheat pasta. Brown rice. Oatmeal. Quinoa. Bulgur. Whole-grain and low-sodium cereals. Pita bread. Low-fat, low-sodium crackers. Whole-wheat flour tortillas. Vegetables Fresh or frozen vegetables (raw, steamed, roasted, or grilled). Low-sodium or reduced-sodium tomato and vegetable juice. Low-sodium or reduced-sodium tomato sauce and tomato paste. Low-sodium or reduced-sodium canned vegetables. Fruits All fresh, dried, or frozen fruit. Canned fruit in natural juice (without  added sugar). Meat and other protein foods Skinless chicken or turkey. Ground chicken or turkey. Pork with fat trimmed off. Fish and seafood. Egg whites. Dried beans, peas, or lentils. Unsalted nuts, nut butters, and seeds. Unsalted canned beans. Lean cuts of beef with fat trimmed off. Low-sodium, lean deli meat. Dairy Low-fat (1%) or fat-free (skim) milk. Fat-free, low-fat, or reduced-fat cheeses. Nonfat, low-sodium ricotta or cottage cheese. Low-fat or nonfat yogurt. Low-fat, low-sodium cheese. Fats and oils Soft margarine without trans fats. Vegetable oil. Low-fat, reduced-fat, or light mayonnaise and salad dressings (reduced-sodium). Canola, safflower, olive, soybean, and sunflower oils. Avocado. Seasoning and other foods Herbs. Spices. Seasoning mixes without salt. Unsalted popcorn and pretzels. Fat-free sweets. What foods are not recommended? The items listed may not be a complete list. Talk with your dietitian about what dietary choices are best for you. Grains Baked goods made with fat, such as croissants, muffins, or some breads. Dry pasta or rice meal packs. Vegetables Creamed or fried vegetables. Vegetables in a cheese sauce. Regular canned vegetables (not low-sodium or reduced-sodium). Regular canned tomato sauce and paste (not low-sodium or reduced-sodium). Regular tomato and vegetable juice (not low-sodium or reduced-sodium). Pickles. Olives. Fruits Canned fruit in a light or heavy syrup. Fried fruit. Fruit in cream or butter sauce. Meat and other protein foods Fatty cuts of meat. Ribs. Fried meat. Bacon. Sausage. Bologna and other processed lunch meats. Salami. Fatback. Hotdogs. Bratwurst. Salted nuts and seeds. Canned beans with added salt. Canned or smoked fish. Whole eggs or egg yolks. Chicken or turkey with skin. Dairy Whole or 2% milk, cream, and half-and-half. Whole or full-fat cream cheese. Whole-fat or sweetened yogurt. Full-fat cheese. Nondairy creamers. Whipped toppings.  Processed cheese and cheese spreads. Fats and oils Butter. Stick margarine. Lard. Shortening. Ghee. Bacon fat. Tropical oils, such as coconut, palm kernel, or palm oil. Seasoning and other foods Salted popcorn and pretzels. Onion salt, garlic salt, seasoned salt, table salt, and sea salt. Worcestershire sauce. Tartar sauce. Barbecue sauce. Teriyaki sauce. Soy sauce, including reduced-sodium. Steak sauce. Canned and packaged gravies. Fish sauce. Oyster sauce. Cocktail sauce. Horseradish that you find on the shelf. Ketchup. Mustard. Meat flavorings and tenderizers. Bouillon cubes. Hot sauce and Tabasco sauce. Premade or packaged marinades. Premade or packaged taco seasonings. Relishes. Regular salad dressings. Where to find more information:  National Heart, Lung, and Blood Institute: www.nhlbi.nih.gov  American Heart Association: www.heart.org Summary  The DASH eating plan is a healthy eating plan that has been shown to reduce high blood pressure (hypertension). It may also reduce your risk for type 2 diabetes, heart disease, and stroke.  With the DASH eating plan, you should limit salt (sodium) intake to 2,300 mg a day. If you have hypertension, you may need to reduce your sodium intake to 1,500 mg a day.  When on the DASH eating plan, aim to eat more fresh fruits and vegetables, whole grains, lean proteins, low-fat dairy, and heart-healthy fats.  Work with your health care provider or diet and nutrition specialist (dietitian) to adjust your eating plan to your   individual calorie needs. This information is not intended to replace advice given to you by your health care provider. Make sure you discuss any questions you have with your health care provider. Document Released: 08/22/2011 Document Revised: 08/15/2017 Document Reviewed: 08/26/2016 Elsevier Patient Education  2020 [employer].  Hypertension, Adult Hypertension is another name for high blood pressure. High blood pressure forces  your heart to work harder to pump blood. This can cause problems over time. There are two numbers in a blood pressure reading. There is a top number (systolic) over a bottom number (diastolic). It is best to have a blood pressure that is below 120/80. Healthy choices can help lower your blood pressure, or you may need medicine to help lower it. What are the causes? The cause of this condition is not known. Some conditions may be related to high blood pressure. What increases the risk?  Smoking.  Having type 2 diabetes mellitus, high cholesterol, or both.  Not getting enough exercise or physical activity.  Being overweight.  Having too much fat, sugar, calories, or salt (sodium) in your diet.  Drinking too much alcohol.  Having long-term (chronic) kidney disease.  Having a family history of high blood pressure.  Age. Risk increases with age.  Race. You may be at higher risk if you are African American.  Gender. Men are at higher risk than women before age 70. After age 78, women are at higher risk than men.  Having obstructive sleep apnea.  Stress. What are the signs or symptoms?  High blood pressure may not cause symptoms. Very high blood pressure (hypertensive crisis) may cause: ? Headache. ? Feelings of worry or nervousness (anxiety). ? Shortness of breath. ? Nosebleed. ? A feeling of being sick to your stomach (nausea). ? Throwing up (vomiting). ? Changes in how you see. ? Very bad chest pain. ? Seizures. How is this treated?  This condition is treated by making healthy lifestyle changes, such as: ? Eating healthy foods. ? Exercising more. ? Drinking less alcohol.  Your health care provider may prescribe medicine if lifestyle changes are not enough to get your blood pressure under control, and if: ? Your top number is above 130. ? Your bottom number is above 80.  Your personal target blood pressure may vary. Follow these instructions at home: Eating and  drinking   If told, follow the DASH eating plan. To follow this plan: ? Fill one half of your plate at each meal with fruits and vegetables. ? Fill one fourth of your plate at each meal with whole grains. Whole grains include whole-wheat pasta, brown rice, and whole-grain bread. ? Eat or drink low-fat dairy products, such as skim milk or low-fat yogurt. ? Fill one fourth of your plate at each meal with low-fat (lean) proteins. Low-fat proteins include fish, chicken without skin, eggs, beans, and tofu. ? Avoid fatty meat, cured and processed meat, or chicken with skin. ? Avoid pre-made or processed food.  Eat less than 1,500 mg of salt each day.  Do not drink alcohol if: ? Your doctor tells you not to drink. ? You are pregnant, may be pregnant, or are planning to become pregnant.  If you drink alcohol: ? Limit how much you use to:  0-1 drink a day for women.  0-2 drinks a day for men. ? Be aware of how much alcohol is in your drink. In the [place] one drink equals one 12 oz bottle of beer (355 mL), one 5 oz glass  of wine (148 mL), or one 1 oz glass of hard liquor (44 mL). Lifestyle   Work with your doctor to stay at a healthy weight or to lose weight. Ask your doctor what the best weight is for you.  Get at least 30 minutes of exercise most days of the week. This may include walking, swimming, or biking.  Get at least 30 minutes of exercise that strengthens your muscles (resistance exercise) at least 3 days a week. This may include lifting weights or doing Pilates.  Do not use any products that contain nicotine or tobacco, such as cigarettes, e-cigarettes, and chewing tobacco. If you need help quitting, ask your doctor.  Check your blood pressure at home as told by your doctor.  Keep all follow-up visits as told by your doctor. This is important. Medicines  Take over-the-counter and prescription medicines only as told by your doctor. Follow directions carefully.  Do not skip  doses of blood pressure medicine. The medicine does not work as well if you skip doses. Skipping doses also puts you at risk for problems.  Ask your doctor about side effects or reactions to medicines that you should watch for. Contact a doctor if you:  Think you are having a reaction to the medicine you are taking.  Have headaches that keep coming back (recurring).  Feel dizzy.  Have swelling in your ankles.  Have trouble with your vision. Get help right away if you:  Get a very bad headache.  Start to feel mixed up (confused).  Feel weak or numb.  Feel faint.  Have very bad pain in your: ? Chest. ? Belly (abdomen).  Throw up more than once.  Have trouble breathing. Summary  Hypertension is another name for high blood pressure.  High blood pressure forces your heart to work harder to pump blood.  For most people, a normal blood pressure is less than 120/80.  Making healthy choices can help lower blood pressure. If your blood pressure does not get lower with healthy choices, you may need to take medicine. This information is not intended to replace advice given to you by your health care provider. Make sure you discuss any questions you have with your health care provider. Document Released: 02/19/2008 Document Revised: 05/13/2018 Document Reviewed: 05/13/2018 Elsevier Patient Education  2020 [employer].

## 2019-05-12 ENCOUNTER — Encounter: Payer: BLUE CROSS/BLUE SHIELD | Admitting: Family Medicine

## 2019-06-14 ENCOUNTER — Other Ambulatory Visit: Payer: Self-pay

## 2019-06-14 DIAGNOSIS — U071 COVID-19: Secondary | ICD-10-CM

## 2019-06-15 LAB — SPECIMEN STATUS REPORT

## 2019-06-15 LAB — NOVEL CORONAVIRUS, NAA: SARS-CoV-2, NAA: NOT DETECTED

## 2019-07-02 ENCOUNTER — Ambulatory Visit: Payer: BLUE CROSS/BLUE SHIELD | Attending: Family Medicine | Admitting: Family Medicine

## 2019-07-02 ENCOUNTER — Other Ambulatory Visit: Payer: Self-pay

## 2019-11-25 ENCOUNTER — Other Ambulatory Visit: Payer: Self-pay | Admitting: Family Medicine

## 2019-11-25 ENCOUNTER — Ambulatory Visit: Payer: Self-pay | Attending: Family Medicine | Admitting: Family Medicine

## 2019-11-25 ENCOUNTER — Other Ambulatory Visit: Payer: Self-pay

## 2019-11-25 ENCOUNTER — Encounter: Payer: Self-pay | Admitting: Family Medicine

## 2019-11-25 VITALS — BP 104/69 | HR 95 | Ht 70.0 in | Wt 200.2 lb

## 2019-11-25 DIAGNOSIS — M545 Low back pain, unspecified: Secondary | ICD-10-CM

## 2019-11-25 DIAGNOSIS — Z124 Encounter for screening for malignant neoplasm of cervix: Secondary | ICD-10-CM

## 2019-11-25 DIAGNOSIS — Z0000 Encounter for general adult medical examination without abnormal findings: Secondary | ICD-10-CM

## 2019-11-25 DIAGNOSIS — K219 Gastro-esophageal reflux disease without esophagitis: Secondary | ICD-10-CM

## 2019-11-25 DIAGNOSIS — N898 Other specified noninflammatory disorders of vagina: Secondary | ICD-10-CM

## 2019-11-25 DIAGNOSIS — Z1231 Encounter for screening mammogram for malignant neoplasm of breast: Secondary | ICD-10-CM

## 2019-11-25 DIAGNOSIS — I10 Essential (primary) hypertension: Secondary | ICD-10-CM

## 2019-11-25 DIAGNOSIS — Z113 Encounter for screening for infections with a predominantly sexual mode of transmission: Secondary | ICD-10-CM

## 2019-11-25 MED ORDER — HYDROCHLOROTHIAZIDE 25 MG PO TABS: 25.0000 mg | ORAL_TABLET | Freq: Every day | ORAL | 0 refills | Status: DC

## 2019-11-25 MED ORDER — OMEPRAZOLE 20 MG PO CPDR: 20.0000 mg | DELAYED_RELEASE_CAPSULE | Freq: Two times a day (BID) | ORAL | 0 refills | Status: DC

## 2019-11-25 NOTE — Patient Instructions (Addendum)
Health Maintenance, Female Adopting a healthy lifestyle and getting preventive care are important in promoting health and wellness. Ask your health care provider about:  The right schedule for you to have regular tests and exams.  Things you can do on your own to prevent diseases and keep yourself healthy. What should I know about diet, weight, and exercise? Eat a healthy diet   Eat a diet that includes plenty of vegetables, fruits, low-fat dairy products, and lean protein.  Do not eat a lot of foods that are high in solid fats, added sugars, or sodium. Maintain a healthy weight Body mass index (BMI) is used to identify weight problems. It estimates body fat based on height and weight. Your health care provider can help determine your BMI and help you achieve or maintain a healthy weight. Get regular exercise Get regular exercise. This is one of the most important things you can do for your health. Most adults should:  Exercise for at least 150 minutes each week. The exercise should increase your heart rate and make you sweat (moderate-intensity exercise).  Do strengthening exercises at least twice a week. This is in addition to the moderate-intensity exercise.  Spend less time sitting. Even light physical activity can be beneficial. Watch cholesterol and blood lipids Have your blood tested for lipids and cholesterol at 46 years of age, then have this test every 5 years. Have your cholesterol levels checked more often if:  Your lipid or cholesterol levels are high.  You are older than 46 years of age.  You are at high risk for heart disease. What should I know about cancer screening? Depending on your health history and family history, you may need to have cancer screening at various ages. This may include screening for:  Breast cancer.  Cervical cancer.  Colorectal cancer.  Skin cancer.  Lung cancer. What should I know about heart disease, diabetes, and high blood  pressure? Blood pressure and heart disease  High blood pressure causes heart disease and increases the risk of stroke. This is more likely to develop in people who have high blood pressure readings, are of African descent, or are overweight.  Have your blood pressure checked: ? Every 3-5 years if you are 18-39 years of age. ? Every year if you are 40 years old or older. Diabetes Have regular diabetes screenings. This checks your fasting blood sugar level. Have the screening done:  Once every three years after age 40 if you are at a normal weight and have a low risk for diabetes.  More often and at a younger age if you are overweight or have a high risk for diabetes. What should I know about preventing infection? Hepatitis B If you have a higher risk for hepatitis B, you should be screened for this virus. Talk with your health care provider to find out if you are at risk for hepatitis B infection. Hepatitis C Testing is recommended for:  Everyone born from 1945 through 1965.  Anyone with known risk factors for hepatitis C. Sexually transmitted infections (STIs)  Get screened for STIs, including gonorrhea and chlamydia, if: ? You are sexually active and are younger than 46 years of age. ? You are older than 46 years of age and your health care provider tells you that you are at risk for this type of infection. ? Your sexual activity has changed since you were last screened, and you are at increased risk for chlamydia or gonorrhea. Ask your health care provider if   you are at risk.  Ask your health care provider about whether you are at high risk for HIV. Your health care provider may recommend a prescription medicine to help prevent HIV infection. If you choose to take medicine to prevent HIV, you should first get tested for HIV. You should then be tested every 3 months for as long as you are taking the medicine. Pregnancy  If you are about to stop having your period (premenopausal) and  you may become pregnant, seek counseling before you get pregnant.  Take 400 to 800 micrograms (mcg) of folic acid every day if you become pregnant.  Ask for birth control (contraception) if you want to prevent pregnancy. Osteoporosis and menopause Osteoporosis is a disease in which the bones lose minerals and strength with aging. This can result in bone fractures. If you are 65 years old or older, or if you are at risk for osteoporosis and fractures, ask your health care provider if you should:  Be screened for bone loss.  Take a calcium or vitamin D supplement to lower your risk of fractures.  Be given hormone replacement therapy (HRT) to treat symptoms of menopause. Follow these instructions at home: Lifestyle  Do not use any products that contain nicotine or tobacco, such as cigarettes, e-cigarettes, and chewing tobacco. If you need help quitting, ask your health care provider.  Do not use street drugs.  Do not share needles.  Ask your health care provider for help if you need support or information about quitting drugs. Alcohol use  Do not drink alcohol if: ? Your health care provider tells you not to drink. ? You are pregnant, may be pregnant, or are planning to become pregnant.  If you drink alcohol: ? Limit how much you use to 0-1 drink a day. ? Limit intake if you are breastfeeding.  Be aware of how much alcohol is in your drink. In the [place] one drink equals one 12 oz bottle of beer (355 mL), one 5 oz glass of wine (148 mL), or one 1 oz glass of hard liquor (44 mL). General instructions  Schedule regular health, dental, and eye exams.  Stay current with your vaccines.  Tell your health care provider if: ? You often feel depressed. ? You have ever been abused or do not feel safe at home. Summary  Adopting a healthy lifestyle and getting preventive care are important in promoting health and wellness.  Follow your health care provider's instructions about healthy  diet, exercising, and getting tested or screened for diseases.  Follow your health care provider's instructions on monitoring your cholesterol and blood pressure. This information is not intended to replace advice given to you by your health care provider. Make sure you discuss any questions you have with your health care provider. Document Revised: 08/26/2018 Document Reviewed: 08/26/2018 Elsevier Patient Education  2020 Elsevier Inc.  

## 2019-11-25 NOTE — Progress Notes (Signed)
Subjective:  Patient ID: Alicia Robinson, female    DOB: 10/30/1975  Age: 44 y.o. MRN: [id number]  CC: Annual Exam and Gynecologic Exam   HPI Alicia Robinson, 44 year old female, who presents for annual well exam.  She believes that her last Pap smear was 3 to 4 years ago.  She reports no history of abnormal Pap smears.  She reports that her periods currently occur regularly.  She has had some recent vaginal discharge.  She would like to be screened for sexually transmitted diseases.  She denies any current vaginal, pelvic or abdominal pain.  She continues to have issues with acid reflux and does need a refill of her reflux medication as well as a refill of blood pressure medication and she is out of medication for her cholesterol.  She has not had a recent fasting lipid panel.  She is nonfasting at today's visit.  She also reports that she has had lower back pain which radiates down to the right leg and stops usually above the knee.  Pain is sharp.  Current pain is about a 4-6 on a 0-to-10 scale.  Over-the-counter medications have not been effective for her back pain.        She reports that she does not perform self breast exams as she does not know how to do a self breast exam.  She denies any current issues with breast tenderness, no skin changes and no nipple discharge.  She is willing to be referred to have her mammogram done.  Past Medical History:  Diagnosis Date  . Depression   . Hypertension   . Suicidal overdose [facility])     Past Surgical History:  Procedure Laterality Date  . CHOLECYSTECTOMY    . TUBAL LIGATION      Family History  Problem Relation Age of Onset  . Hypertension Mother   . Diabetes Mother   . Hypertension Father   . Diabetes Father   . Diabetes Maternal Grandmother   . Hypertension Maternal Grandmother     Social History   Tobacco Use  . Smoking status: Never Smoker  . Smokeless tobacco: Never Used  Substance Use Topics  . Alcohol use: Yes    Comment:  weekends    ROS Review of Systems  Constitutional: Positive for fatigue. Negative for chills and fever.  HENT: Negative for sore throat and trouble swallowing.   Eyes: Negative for photophobia and visual disturbance.  Respiratory: Negative for cough and shortness of breath.   Cardiovascular: Negative for chest pain and palpitations.  Gastrointestinal: Negative for abdominal pain, blood in stool, constipation, diarrhea and nausea.  Endocrine: Negative for cold intolerance, heat intolerance, polydipsia, polyphagia and polyuria.  Genitourinary: Positive for vaginal discharge. Negative for dysuria, frequency, menstrual problem, pelvic pain and vaginal pain.  Musculoskeletal: Positive for back pain and gait problem. Negative for arthralgias.  Neurological: Negative for dizziness and headaches.  Hematological: Negative for adenopathy. Does not bruise/bleed easily.  Psychiatric/Behavioral: Negative for self-injury and suicidal ideas. The patient is nervous/anxious.     Objective:   Today's Vitals: BP 104/69   Pulse 95   Ht 5\' 10"  (1.778 m)   Wt 200 lb 3.2 oz (90.8 kg)   SpO2 98%   BMI 28.73 kg/m   Physical Exam Vitals and nursing note reviewed.  Constitutional:      General: She is not in acute distress.    Appearance: Normal appearance.     Comments: Well-nourished well-developed female who appears overweight for height  but is tall framed, who is in no acute distress and wearing facial mask as per office COVID-19 protocol  Cardiovascular:     Rate and Rhythm: Normal rate and regular rhythm.  Pulmonary:     Effort: Pulmonary effort is normal.     Breath sounds: Normal breath sounds.  Abdominal:     Palpations: Abdomen is soft.     Tenderness: There is no abdominal tenderness. There is no right CVA tenderness, left CVA tenderness, guarding or rebound.  Genitourinary:    Vagina: Vaginal discharge present.     Comments: Mild friability of the cervix with collection of cells for Pap  smear.  Light yellow vaginal discharge present in vaginal canal/vault and surface of cervix.  No cervical motion tenderness and no adnexal tenderness Musculoskeletal:        General: Tenderness present.     Cervical back: Normal range of motion and neck supple. No tenderness.     Right lower leg: No edema.     Left lower leg: No edema.     Comments: Lumbosacral tenderness and positive right supine leg raise  Lymphadenopathy:     Cervical: No cervical adenopathy.  Skin:    General: Skin is warm and dry.  Neurological:     General: No focal deficit present.     Mental Status: She is alert and oriented to person, place, and time.  Psychiatric:        Mood and Affect: Mood normal.        Behavior: Behavior normal.     Assessment & Plan:  1. Encounter for annual health examination Patient here for encounter for annual health exam and she has complaint of low back pain with radiation as well as request for screening for sexually transmitted diseases.  Her Pap smear was done at today's visit and she also agrees to be referred for screening mammogram.  Educational material on health maintenance provided as part of her AVS.  She also had other concerns which were addressed separately and for which she is to make follow-up of chronic medical issues including blood pressure, acid reflux and fasting visit/labs in follow-up of hyperlipidemia.  2. Encounter for screening mammogram for malignant neoplasm of breast Patient was instructed on how to perform monthly self breast exams.  She agrees to be referred for her mammogram as a screening tool for breast cancer. - MM Digital Screening; Future  3. Screening for cervical cancer Pap smear done at today's visit as a screening test for cervical cancer and she will be notified if any further evaluation or treatment are needed based on the Pap smear results. - Cytology - PAP  4. Screening for STD (sexually transmitted disease) Patient request screening  for STDs at today's visit and cervicovaginal ancillary testing done with Aptiva swab during pelvic exam.  Patient will also have blood work for HIV and syphilis.  She will be notified of her results and if any further treatment is needed based on the results. - Cervicovaginal ancillary only - HIV antibody (with reflex) - RPR  5. Vaginal discharge Patient with vaginal discharge on pelvic exam.  Cervicovaginal ancillary testing with Aptiva swab was also done at today's visit and she will be notified of the results and if any further treatment is needed based on the results. - Cervicovaginal ancillary only  6. Low back pain with radiation She complains of recent onset of low back pain with radiation for which she reports that prior treatment and over-the-counter medications have  been ineffective.  She will be referred to orthopedics for further evaluation and treatment. - AMB referral to orthopedics  Outpatient Encounter Medications as of 11/25/2019  Medication Sig  . acetaminophen (TYLENOL) 325 MG tablet Take 2 tablets (650 mg total) by mouth every 6 (six) hours as needed for mild pain.  Marland Kitchen aspirin EC 81 MG tablet Take 1 tablet (81 mg total) by mouth daily. For heart health  . buPROPion (WELLBUTRIN XL) 300 MG 24 hr tablet Take 1 tablet (300 mg total) by mouth daily. For mood  . diclofenac sodium (VOLTAREN) 1 % GEL Apply 4 g topically 4 (four) times daily. As needed for joint pain  . doxepin (SINEQUAN) 10 MG capsule   . hydrochlorothiazide (HYDRODIURIL) 25 MG tablet Take 1 tablet (25 mg total) by mouth daily. For high blood pressure  . Multiple Vitamins-Minerals (MULTIVITAMIN ADULT PO) Take 1 tablet by mouth.  Marland Kitchen omeprazole (PRILOSEC) 20 MG capsule Take 20 mg by mouth 2 (two) times daily before a meal.   . atorvastatin (LIPITOR) 20 MG tablet Take 1 tablet (20 mg total) by mouth daily. For high cholesterol (Patient not taking: Reported on 11/25/2019)  . ibuprofen (ADVIL,MOTRIN) 400 MG tablet Take 1  tablet (400 mg total) by mouth every 6 (six) hours as needed for fever, headache or mild pain. (Patient not taking: Reported on 11/25/2019)  . mirtazapine (REMERON) 15 MG tablet Take 1 tablet (15 mg total) by mouth at bedtime. For mood (Patient not taking: Reported on 04/01/2019)  . perphenazine (TRILAFON) 4 MG tablet Take 1 tablet (4 mg total) by mouth 2 (two) times daily. For psychosis (Patient not taking: Reported on 04/01/2019)   No facility-administered encounter medications on file as of 11/25/2019.    An After Visit Summary was printed and given to the patient.   Follow-up: Return in about 5 weeks (around 12/30/2019) for chronic issues/concerns;fasting labs.   Antony Blackbird MD

## 2019-11-25 NOTE — Progress Notes (Signed)
Patient ID: Alicia Robinson, female   DOB: 18-Sep-1975, 44 y.o.   MRN: [id number]   Patient here for well exam and requested RX's for her BP and reflux medications. She has been out of atorvastatin and will return for f/u appointment and fasting labs

## 2019-11-26 LAB — CERVICOVAGINAL ANCILLARY ONLY
Bacterial Vaginitis (gardnerella): NEGATIVE
Candida Glabrata: NEGATIVE
Candida Vaginitis: NEGATIVE
Chlamydia: NEGATIVE
Comment: NEGATIVE
Comment: NEGATIVE
Comment: NEGATIVE
Comment: NEGATIVE
Comment: NEGATIVE
Comment: NORMAL
Neisseria Gonorrhea: NEGATIVE
Trichomonas: NEGATIVE

## 2019-11-26 LAB — SYPHILIS: RPR W/REFLEX TO RPR TITER AND TREPONEMAL ANTIBODIES, TRADITIONAL SCREENING AND DIAGNOSIS ALGORITHM: RPR Ser Ql: NONREACTIVE

## 2019-11-26 LAB — HIV ANTIBODY (ROUTINE TESTING W REFLEX): HIV Screen 4th Generation wRfx: NONREACTIVE

## 2019-11-29 ENCOUNTER — Encounter: Payer: Self-pay | Admitting: Family Medicine

## 2019-11-30 LAB — CYTOLOGY - PAP
Adequacy: ABSENT
Comment: NEGATIVE
Diagnosis: NEGATIVE
High risk HPV: NEGATIVE

## 2019-12-31 ENCOUNTER — Ambulatory Visit: Payer: Medicaid Other | Admitting: Family Medicine

## 2020-01-06 ENCOUNTER — Ambulatory Visit: Payer: Medicaid Other | Admitting: Family

## 2020-01-26 ENCOUNTER — Ambulatory Visit: Payer: Medicaid Other | Admitting: Family Medicine

## 2020-02-11 ENCOUNTER — Ambulatory Visit: Payer: Medicaid Other | Admitting: Family Medicine

## 2020-02-25 NOTE — Progress Notes (Signed)
Virtual Visit via Telephone Note  I connected with Alicia Robinson, on 02/28/2020 at 9:56 AM by telephone due to the COVID-19 pandemic and verified that I am speaking with the correct person using two identifiers.  Due to current restrictions/limitations of in-office visits due to the COVID-19 pandemic, this scheduled clinical appointment was converted to a telehealth visit.   Consent: I discussed the limitations, risks, security and privacy concerns of performing an evaluation and management service by telephone and the availability of in person appointments. I also discussed with the patient that there may be a patient responsible charge related to this service. The patient expressed understanding and agreed to proceed.  Location of Patient: Home  Location of Provider: [facility]   Persons participating in Telemedicine visit: Kendrick Ranch, NP Mariane Baumgarten, CMA  History of Present Illness:  CC: Hypertension follow-up  Subjective: Alicia Robinson is a 44 y.o. 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  1. HYPERTENSION FOLLOW-UP:  Currently taking: see medication list Med Adherence: [x]  Yes    []  No Medication side effects: []  Yes    [x]  No Adherence with salt restriction: [x]  Yes    []  No Exercise: Yes [x]  No []  Home Monitoring?: []  Yes    [x]  No Monitoring Frequency: []  Yes    [x]  No Home BP results range: [x]  Yes    []  No 116-120s/70s-80s Smoking []  Yes [x]  No SOB? []  Yes    [x]  No Chest Pain?: []  Yes    [x]  No Leg swelling?: []  Yes    [x]  No Headaches?: []  Yes    [x]  No Dizziness? []  Yes    [x]  No Comments:  Last visit 04/01/2019 with Dr. Chapman Fitch. During that encounter Hydrochlorothiazide continued. 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 There was no  information in her medical records regarding this and patient was encouraged to find out information so that her prior records could be obtained prior to discontinuing medication.  2. GERD FOLLOW-UP: GERD control status: stableSatisfied with current treatment? yes Medication side effects: no  Medication compliance: stable Previous GERD medications: denies Antacid use frequency: 3 times weekly Dysphagia: no Odynophagia:  no Hematemesis: no Blood in stool: no EGD: no Patient last prescribed Omeprazole for GERD management in March 2021.  3. HYPERLIPIDEMIA FOLLOW-UP: Last Lipid Panel results:   HDL  Date Value Ref Range Status  11/19/2018 41 >40 mg/dL Final   Triglycerides  Date Value Ref Range Status  11/19/2018 81 <150 mg/dL Final   Are you fasting today: []  Yes [x]  No Med Adherence: []  Yes    [x]  No, patient reports she was told from her primary physician to discontinue using Atorvastatin until her cholesterol labs are collected.   4. MAMMOGRAM FOLLOW-UP:  -Last visit 11/25/2019 with Dr. Chapman Fitch. During that encounter patient referred to have mammogram. -Have you received mammogram or scheduled appointment? Today reports she has not made appointment yet.    Past Medical History:  Diagnosis Date  . Depression   . Hypertension   . Suicidal overdose ([place])    No Known Allergies  Current Outpatient Medications on File Prior to Visit  Medication Sig Dispense Refill  . acetaminophen (TYLENOL) 325 MG tablet Take 2 tablets (650 mg total) by mouth every 6 (six) hours as needed for mild pain.    Marland Kitchen aspirin  EC 81 MG tablet Take 1 tablet (81 mg total) by mouth daily. For heart health    . atorvastatin (LIPITOR) 20 MG tablet Take 1 tablet (20 mg total) by mouth daily. For high cholesterol (Patient not taking: Reported on 11/25/2019)    . buPROPion (WELLBUTRIN XL) 300 MG 24 hr tablet Take 1 tablet (300 mg total) by mouth daily. For mood 30 tablet 0  . diclofenac sodium (VOLTAREN) 1 % GEL  Apply 4 g topically 4 (four) times daily. As needed for joint pain 150 g 3  . doxepin (SINEQUAN) 10 MG capsule     . hydrochlorothiazide (HYDRODIURIL) 25 MG tablet Take 1 tablet (25 mg total) by mouth daily. For high blood pressure 90 tablet 0  . ibuprofen (ADVIL,MOTRIN) 400 MG tablet Take 1 tablet (400 mg total) by mouth every 6 (six) hours as needed for fever, headache or mild pain. (Patient not taking: Reported on 11/25/2019)  0  . mirtazapine (REMERON) 15 MG tablet Take 1 tablet (15 mg total) by mouth at bedtime. For mood (Patient not taking: Reported on 04/01/2019) 30 tablet 0  . Multiple Vitamins-Minerals (MULTIVITAMIN ADULT PO) Take 1 tablet by mouth.    Marland Kitchen omeprazole (PRILOSEC) 20 MG capsule Take 1 capsule (20 mg total) by mouth 2 (two) times daily before a meal. 180 capsule 0  . perphenazine (TRILAFON) 4 MG tablet Take 1 tablet (4 mg total) by mouth 2 (two) times daily. For psychosis (Patient not taking: Reported on 04/01/2019) 60 tablet 0   No current facility-administered medications on file prior to visit.    Observations/Objective: Alert and oriented x 3. Not in acute distress. Physical examination not completed as this is a telemedicine visit.  Assessment and Plan: 1. Essential hypertension: -Patient reports her blood pressures range 116-120's/70s-80's.  -Counseled on blood pressure goal of less than 130/80, low-sodium, DASH diet, medication compliance, 150 minutes of moderate intensity exercise per week as tolerated. Discussed medication compliance, adverse effects. -CMP to evaluate liver function, kidney function, and electrolyte balance. -CBC to evaluate blood count. -Continue Hydrochlorothiazide as prescribed. -Follow-up with primary physician in 3 months or sooner if needed.  - CMP14+EGFR - CBC With Differential - hydrochlorothiazide (HYDRODIURIL) 25 MG tablet; Take 1 tablet (25 mg total) by mouth daily. For high blood pressure  Dispense: 90 tablet; Refill: 0  2.  Gastroesophageal reflux disease, unspecified whether esophagitis present: -Continue Omeprazole as needed. -Follow-up with primary physician as needed. - omeprazole (PRILOSEC) 20 MG capsule; Take 1 capsule (20 mg total) by mouth 2 (two) times daily before a meal.  Dispense: 180 capsule; Refill: 0  3. Screening for cholesterol level: -Patient currently not taking Atorvastatin as she reports she was counseled not to do so by her primary physician until her next cholesterol labs are complete. -Lipid panel to evaluate cholesterol levels. - Lipid panel  4. Screening for diabetes mellitus: -Hemoglobin A1C to screen for diabetes. - Hemoglobin A1c  5. Screening for thyroid disorder: -TSH to screen for thyroid dysfunction. - TSH  6. Encounter for screening mammogram for malignant neoplasm of breast: -Last visit 11/25/2019 with Dr. Chapman Fitch. During that encounter patient referred to have mammogram. -Today patient reports she has not made appointment for mammogram but plans to do so soon. -Encouraged patient to make appointment for mammogram.  Follow Up Instructions: Patient was given clear instructions to go to Emergency Department or return to medical center if symptoms don't improve, worsen, or new problems develop.The patient verbalized understanding.  I discussed the assessment  and treatment plan with the patient. The patient was provided an opportunity to ask questions and all were answered. The patient agreed with the plan and demonstrated an understanding of the instructions.   The patient was advised to call back or seek an in-person evaluation if the symptoms worsen or if the condition fails to improve as anticipated.  I provided 7 minutes total of non-face-to-face time during this encounter including median intraservice time, reviewing previous notes, labs, imaging, medications, management and patient verbalized understanding.    Camillia Herter, NP  [facility] and  [facility] [place], [place]   02/28/2020, 9:56 AM

## 2020-02-28 ENCOUNTER — Ambulatory Visit: Payer: Self-pay | Attending: Family Medicine | Admitting: Family

## 2020-02-28 DIAGNOSIS — Z13220 Encounter for screening for lipoid disorders: Secondary | ICD-10-CM

## 2020-02-28 DIAGNOSIS — Z79899 Other long term (current) drug therapy: Secondary | ICD-10-CM | POA: Insufficient documentation

## 2020-02-28 DIAGNOSIS — K219 Gastro-esophageal reflux disease without esophagitis: Secondary | ICD-10-CM | POA: Insufficient documentation

## 2020-02-28 DIAGNOSIS — Z7982 Long term (current) use of aspirin: Secondary | ICD-10-CM | POA: Insufficient documentation

## 2020-02-28 DIAGNOSIS — Z131 Encounter for screening for diabetes mellitus: Secondary | ICD-10-CM

## 2020-02-28 DIAGNOSIS — F323 Major depressive disorder, single episode, severe with psychotic features: Secondary | ICD-10-CM | POA: Insufficient documentation

## 2020-02-28 DIAGNOSIS — E876 Hypokalemia: Secondary | ICD-10-CM | POA: Insufficient documentation

## 2020-02-28 DIAGNOSIS — Z1231 Encounter for screening mammogram for malignant neoplasm of breast: Secondary | ICD-10-CM

## 2020-02-28 DIAGNOSIS — Z1329 Encounter for screening for other suspected endocrine disorder: Secondary | ICD-10-CM

## 2020-02-28 DIAGNOSIS — E785 Hyperlipidemia, unspecified: Secondary | ICD-10-CM | POA: Insufficient documentation

## 2020-02-28 DIAGNOSIS — I10 Essential (primary) hypertension: Secondary | ICD-10-CM

## 2020-02-28 DIAGNOSIS — Z791 Long term (current) use of non-steroidal anti-inflammatories (NSAID): Secondary | ICD-10-CM | POA: Insufficient documentation

## 2020-02-28 MED ORDER — OMEPRAZOLE 20 MG PO CPDR: 20.0000 mg | DELAYED_RELEASE_CAPSULE | Freq: Two times a day (BID) | ORAL | 0 refills | Status: DC

## 2020-02-28 MED ORDER — HYDROCHLOROTHIAZIDE 25 MG PO TABS: 25.0000 mg | ORAL_TABLET | Freq: Every day | ORAL | 0 refills | Status: DC

## 2020-02-28 NOTE — Patient Instructions (Signed)
Continue Hydrochlorothiazide for blood pressure. Continue Prilosec for acid reflux. Labs tomorrow. Follow-up with primary physician in 3 months or sooner if needed. Hypertension, Adult Hypertension is another name for high blood pressure. High blood pressure forces your heart to work harder to pump blood. This can cause problems over time. There are two numbers in a blood pressure reading. There is a top number (systolic) over a bottom number (diastolic). It is best to have a blood pressure that is below 120/80. Healthy choices can help lower your blood pressure, or you may need medicine to help lower it. What are the causes? The cause of this condition is not known. Some conditions may be related to high blood pressure. What increases the risk?  Smoking.  Having type 2 diabetes mellitus, high cholesterol, or both.  Not getting enough exercise or physical activity.  Being overweight.  Having too much fat, sugar, calories, or salt (sodium) in your diet.  Drinking too much alcohol.  Having long-term (chronic) kidney disease.  Having a family history of high blood pressure.  Age. Risk increases with age.  Race. You may be at higher risk if you are African American.  Gender. Men are at higher risk than women before age 4. After age 71, women are at higher risk than men.  Having obstructive sleep apnea.  Stress. What are the signs or symptoms?  High blood pressure may not cause symptoms. Very high blood pressure (hypertensive crisis) may cause: ? Headache. ? Feelings of worry or nervousness (anxiety). ? Shortness of breath. ? Nosebleed. ? A feeling of being sick to your stomach (nausea). ? Throwing up (vomiting). ? Changes in how you see. ? Very bad chest pain. ? Seizures. How is this treated?  This condition is treated by making healthy lifestyle changes, such as: ? Eating healthy foods. ? Exercising more. ? Drinking less alcohol.  Your health care provider may  prescribe medicine if lifestyle changes are not enough to get your blood pressure under control, and if: ? Your top number is above 130. ? Your bottom number is above 80.  Your personal target blood pressure may vary. Follow these instructions at home: Eating and drinking   If told, follow the DASH eating plan. To follow this plan: ? Fill one half of your plate at each meal with fruits and vegetables. ? Fill one fourth of your plate at each meal with whole grains. Whole grains include whole-wheat pasta, brown rice, and whole-grain bread. ? Eat or drink low-fat dairy products, such as skim milk or low-fat yogurt. ? Fill one fourth of your plate at each meal with low-fat (lean) proteins. Low-fat proteins include fish, chicken without skin, eggs, beans, and tofu. ? Avoid fatty meat, cured and processed meat, or chicken with skin. ? Avoid pre-made or processed food.  Eat less than 1,500 mg of salt each day.  Do not drink alcohol if: ? Your doctor tells you not to drink. ? You are pregnant, may be pregnant, or are planning to become pregnant.  If you drink alcohol: ? Limit how much you use to:  0-1 drink a day for women.  0-2 drinks a day for men. ? Be aware of how much alcohol is in your drink. In the [place] one drink equals one 12 oz bottle of beer (355 mL), one 5 oz glass of wine (148 mL), or one 1 oz glass of hard liquor (44 mL). Lifestyle   Work with your doctor to stay at a healthy weight  or to lose weight. Ask your doctor what the best weight is for you.  Get at least 30 minutes of exercise most days of the week. This may include walking, swimming, or biking.  Get at least 30 minutes of exercise that strengthens your muscles (resistance exercise) at least 3 days a week. This may include lifting weights or doing Pilates.  Do not use any products that contain nicotine or tobacco, such as cigarettes, e-cigarettes, and chewing tobacco. If you need help quitting, ask your  doctor.  Check your blood pressure at home as told by your doctor.  Keep all follow-up visits as told by your doctor. This is important. Medicines  Take over-the-counter and prescription medicines only as told by your doctor. Follow directions carefully.  Do not skip doses of blood pressure medicine. The medicine does not work as well if you skip doses. Skipping doses also puts you at risk for problems.  Ask your doctor about side effects or reactions to medicines that you should watch for. Contact a doctor if you:  Think you are having a reaction to the medicine you are taking.  Have headaches that keep coming back (recurring).  Feel dizzy.  Have swelling in your ankles.  Have trouble with your vision. Get help right away if you:  Get a very bad headache.  Start to feel mixed up (confused).  Feel weak or numb.  Feel faint.  Have very bad pain in your: ? Chest. ? Belly (abdomen).  Throw up more than once.  Have trouble breathing. Summary  Hypertension is another name for high blood pressure.  High blood pressure forces your heart to work harder to pump blood.  For most people, a normal blood pressure is less than 120/80.  Making healthy choices can help lower blood pressure. If your blood pressure does not get lower with healthy choices, you may need to take medicine. This information is not intended to replace advice given to you by your health care provider. Make sure you discuss any questions you have with your health care provider. Document Revised: 05/13/2018 Document Reviewed: 05/13/2018 Elsevier Patient Education  2020 [employer].

## 2020-03-10 ENCOUNTER — Ambulatory Visit: Payer: Self-pay | Attending: Family Medicine

## 2020-03-10 ENCOUNTER — Other Ambulatory Visit: Payer: Self-pay

## 2020-03-11 LAB — CMP14+EGFR
ALT: 25 IU/L (ref 0–32)
AST: 21 IU/L (ref 0–40)
Albumin/Globulin Ratio: 1.7 (ref 1.2–2.2)
Albumin: 4.2 g/dL (ref 3.8–4.8)
Alkaline Phosphatase: 70 IU/L (ref 48–121)
BUN/Creatinine Ratio: 11 (ref 9–23)
BUN: 12 mg/dL (ref 6–24)
Bilirubin Total: 0.2 mg/dL (ref 0.0–1.2)
CO2: 25 mmol/L (ref 20–29)
Calcium: 9.6 mg/dL (ref 8.7–10.2)
Chloride: 98 mmol/L (ref 96–106)
Creatinine, Ser: 1.06 mg/dL — ABNORMAL HIGH (ref 0.57–1.00)
GFR calc Af Amer: 73 mL/min/{1.73_m2} (ref >59)
GFR calc non Af Amer: 64 mL/min/{1.73_m2} (ref >59)
Globulin, Total: 2.5 g/dL (ref 1.5–4.5)
Glucose: 86 mg/dL (ref 65–99)
Potassium: 4.3 mmol/L (ref 3.5–5.2)
Sodium: 136 mmol/L (ref 134–144)
Total Protein: 6.7 g/dL (ref 6.0–8.5)

## 2020-03-11 LAB — CBC WITH DIFFERENTIAL
Basophils Absolute: 0.1 10*3/uL (ref 0.0–0.2)
Basos: 1 %
EOS (ABSOLUTE): 0.3 10*3/uL (ref 0.0–0.4)
Eos: 3 %
Hematocrit: 39.6 % (ref 34.0–46.6)
Hemoglobin: 12.8 g/dL (ref 11.1–15.9)
Immature Grans (Abs): 0 10*3/uL (ref 0.0–0.1)
Immature Granulocytes: 0 %
Lymphocytes Absolute: 3.5 10*3/uL — ABNORMAL HIGH (ref 0.7–3.1)
Lymphs: 31 %
MCH: 28.4 pg (ref 26.6–33.0)
MCHC: 32.3 g/dL (ref 31.5–35.7)
MCV: 88 fL (ref 79–97)
Monocytes Absolute: 1 10*3/uL — ABNORMAL HIGH (ref 0.1–0.9)
Monocytes: 9 %
Neutrophils Absolute: 6.2 10*3/uL (ref 1.4–7.0)
Neutrophils: 56 %
RBC: 4.51 x10E6/uL (ref 3.77–5.28)
RDW: 15.9 % — ABNORMAL HIGH (ref 11.7–15.4)
WBC: 11.1 10*3/uL — ABNORMAL HIGH (ref 3.4–10.8)

## 2020-03-11 LAB — LIPID PANEL
Chol/HDL Ratio: 4.8 ratio — ABNORMAL HIGH (ref 0.0–4.4)
Cholesterol, Total: 267 mg/dL — ABNORMAL HIGH (ref 100–199)
HDL: 56 mg/dL (ref >39)
LDL Chol Calc (NIH): 190 mg/dL — ABNORMAL HIGH (ref 0–99)
Triglycerides: 119 mg/dL (ref 0–149)
VLDL Cholesterol Cal: 21 mg/dL (ref 5–40)

## 2020-03-11 LAB — HEMOGLOBIN A1C
Est. average glucose Bld gHb Est-mCnc: 117 mg/dL
Hgb A1c MFr Bld: 5.7 % — ABNORMAL HIGH (ref 4.8–5.6)

## 2020-03-11 LAB — TSH: TSH: 1 u[IU]/mL (ref 0.450–4.500)

## 2020-03-13 MED ORDER — ATORVASTATIN CALCIUM 20 MG PO TABS: 20.0000 mg | ORAL_TABLET | Freq: Every day | ORAL | 0 refills | Status: DC

## 2020-03-13 NOTE — Progress Notes (Signed)
Please call patient with update.   Kidney function normal.  Liver function normal.   No anemia.  White blood cells higher than expected but improved since last visit. White blood cells help fight infection in the body.  Cholesterol higher than expected. Resume taking Atorvastatin 20 mg by mouth daily. Recommendations to make lifestyle changes. Your LDL is above normal. The LDL is the bad cholesterol. Over time and in combination with inflammation and other factors, this contributes to plaque which in turn may lead to stroke and/or heart attack down the road. Consider room for improvement in one's diet and eating healthier can bring this number down and potentially reduce one's risk of heart attack and/or stroke.   To reduce your LDL, Remember - more fruits and vegetables, more fish, and limit red meat and dairy products. More soy, nuts, beans, barley, lentils, oats and plant sterol ester enriched margarine instead of butter. Also try eliminating sugar and processed food. Cholesterol should be recheck in 3-6 months. If you are breastfeeding do not consume this medication and let your provider know so that a replacement can be prescribed.  Thyroid function normal.   Hemoglobin A1C higher than expected. This monitors for prediabetes/diabetes. Try low-sugar and low-carb diet and at least 150 minutes of moderate intensity exercise weekly as tolerated which may help with this. A1C will be rechecked in 6 months.

## 2020-03-13 NOTE — Addendum Note (Signed)
Addended by: Camillia Herter on: 03/13/2020 04:55 PM   Modules accepted: Orders

## 2020-03-31 ENCOUNTER — Telehealth: Payer: Self-pay | Admitting: Family Medicine

## 2020-03-31 NOTE — Telephone Encounter (Signed)
Pt called stated she had a VT visit and there was suppose to be a script sent in for her , she said I has not been called in , please advise  Lipitor-   [facility] [place]

## 2020-04-03 NOTE — Telephone Encounter (Signed)
Will forward to Amy  °

## 2020-04-04 ENCOUNTER — Other Ambulatory Visit: Payer: Self-pay

## 2020-04-04 DIAGNOSIS — E785 Hyperlipidemia, unspecified: Secondary | ICD-10-CM

## 2020-04-04 MED ORDER — ATORVASTATIN CALCIUM 20 MG PO TABS: 20.0000 mg | ORAL_TABLET | Freq: Every day | ORAL | 0 refills | Status: DC

## 2020-04-04 NOTE — Telephone Encounter (Signed)
Lipitor was filled on 03/13/2020 and sent to [facility] at [place]. Please let me know if further assistance required.

## 2020-04-04 NOTE — Telephone Encounter (Signed)
Contacted pt and went over Amy response pt states the pharmacy stated they never received the rx. Made pt aware that I will resend rx

## 2020-05-12 ENCOUNTER — Other Ambulatory Visit: Payer: Self-pay

## 2020-05-12 DIAGNOSIS — N644 Mastodynia: Secondary | ICD-10-CM

## 2020-05-30 ENCOUNTER — Ambulatory Visit: Payer: Medicaid Other

## 2020-05-30 ENCOUNTER — Other Ambulatory Visit: Payer: Self-pay

## 2020-06-06 ENCOUNTER — Other Ambulatory Visit: Payer: Self-pay

## 2020-06-06 ENCOUNTER — Emergency Department (HOSPITAL_COMMUNITY)
Admission: EM | Admit: 2020-06-06 | Discharge: 2020-06-06 | Disposition: A | Discharge status: Home / Self Care | Payer: Medicaid Other | Attending: Emergency Medicine | Admitting: Emergency Medicine

## 2020-06-06 ENCOUNTER — Ambulatory Visit: Payer: Self-pay | Admitting: *Deleted

## 2020-06-06 ENCOUNTER — Encounter (HOSPITAL_COMMUNITY): Payer: Self-pay

## 2020-06-06 ENCOUNTER — Emergency Department (HOSPITAL_COMMUNITY): Payer: Medicaid Other

## 2020-06-06 DIAGNOSIS — Z5321 Procedure and treatment not carried out due to patient leaving prior to being seen by health care provider: Secondary | ICD-10-CM | POA: Insufficient documentation

## 2020-06-06 DIAGNOSIS — M79602 Pain in left arm: Secondary | ICD-10-CM | POA: Insufficient documentation

## 2020-06-06 DIAGNOSIS — R079 Chest pain, unspecified: Secondary | ICD-10-CM | POA: Insufficient documentation

## 2020-06-06 DIAGNOSIS — R202 Paresthesia of skin: Secondary | ICD-10-CM | POA: Insufficient documentation

## 2020-06-06 LAB — BASIC METABOLIC PANEL
Anion gap: 10 (ref 5–15)
BUN: 11 mg/dL (ref 6–20)
CO2: 26 mmol/L (ref 22–32)
Calcium: 9.4 mg/dL (ref 8.9–10.3)
Chloride: 102 mmol/L (ref 98–111)
Creatinine, Ser: 1.13 mg/dL — ABNORMAL HIGH (ref 0.44–1.00)
GFR calc Af Amer: >60 mL/min (ref >60)
GFR calc non Af Amer: 59 mL/min — ABNORMAL LOW (ref >60)
Glucose, Bld: 153 mg/dL — ABNORMAL HIGH (ref 70–99)
Potassium: 3.4 mmol/L — ABNORMAL LOW (ref 3.5–5.1)
Sodium: 138 mmol/L (ref 135–145)

## 2020-06-06 LAB — CBC
HCT: 42.5 % (ref 36.0–46.0)
Hemoglobin: 13.8 g/dL (ref 12.0–15.0)
MCH: 28.3 pg (ref 26.0–34.0)
MCHC: 32.5 g/dL (ref 30.0–36.0)
MCV: 87.3 fL (ref 80.0–100.0)
Platelets: 568 10*3/uL — ABNORMAL HIGH (ref 150–400)
RBC: 4.87 MIL/uL (ref 3.87–5.11)
RDW: 16.8 % — ABNORMAL HIGH (ref 11.5–15.5)
WBC: 13 10*3/uL — ABNORMAL HIGH (ref 4.0–10.5)
nRBC: 0 % (ref 0.0–0.2)

## 2020-06-06 LAB — TROPONIN I (HIGH SENSITIVITY): Troponin I (High Sensitivity): 4 ng/L (ref <18)

## 2020-06-06 LAB — I-STAT BETA HCG BLOOD, ED (MC, WL, AP ONLY): I-stat hCG, quantitative: <5 m[IU]/mL (ref <5)

## 2020-06-06 NOTE — ED Triage Notes (Signed)
Pt  Presents to ED with complaints of left side chest pain that radiates to left arm with tingling in bilateral fingers x 1 week. Worse today

## 2020-06-06 NOTE — Telephone Encounter (Signed)
Will forward to pcp

## 2020-06-06 NOTE — Telephone Encounter (Signed)
C/o pain in chest and left arm x 1 week with pain and numbness and SOB. C/o sweating and left arm weak, lightheaded and more constant today. Denies pain in neck or jaw. Denies blurred vision. Hx stroke 2016. Instructed patient to go to ED now and/ or call 911 if now one available to take her to ED. Patient reports she is going to call someone to get her now. Care advise given. Patient verbalized understanding of care advise and not to wait.   Reason for Disposition  [1] Chest pain (or "angina") comes and goes AND [2] is happening more often (increasing in frequency) or getting worse (increasing in severity) (Exception: chest pains that last only a few seconds)  Answer Assessment - Initial Assessment Questions 1. LOCATION: "Where does it hurt?"       Chest and left arm pain and numbness 2. RADIATION: "Does the pain go anywhere else?" (e.g., into neck, jaw, arms, back)     No  3. ONSET: "When did the chest pain begin?" (Minutes, hours or days)      1 week ago  4. PATTERN "Does the pain come and go, or has it been constant since it started?"  "Does it get worse with exertion?"      Was coming and going and now constant  5. DURATION: "How long does it last" (e.g., seconds, minutes, hours)     atleast 5 minutes  6. SEVERITY: "How bad is the pain?"  (e.g., Scale 1-10; mild, moderate, or severe)    - MILD (1-3): doesn't interfere with normal activities     - MODERATE (4-7): interferes with normal activities or awakens from sleep    - SEVERE (8-10): excruciating pain, unable to do any normal activities       Moderate  7. CARDIAC RISK FACTORS: "Do you have any history of heart problems or risk factors for heart disease?" (e.g., angina, prior heart attack; diabetes, high blood pressure, high cholesterol, smoker, or strong family history of heart disease)     [place] in 2016, HTN, high cholesterol 8. PULMONARY RISK FACTORS: "Do you have any history of lung disease?"  (e.g., blood clots in lung, asthma,  emphysema, birth control pills)     No  9. CAUSE: "What do you think is causing the chest pain?"     Not sure  10. OTHER SYMPTOMS: "Do you have any other symptoms?" (e.g., dizziness, nausea, vomiting, sweating, fever, difficulty breathing, cough)       Sweating and lightheadedness 11. PREGNANCY: "Is there any chance you are pregnant?" "When was your last menstrual period?"       na  Protocols used: CHEST PAIN-A-AH

## 2020-06-07 ENCOUNTER — Encounter (HOSPITAL_COMMUNITY): Payer: Self-pay

## 2020-06-07 ENCOUNTER — Emergency Department (HOSPITAL_COMMUNITY): Payer: Self-pay

## 2020-06-07 ENCOUNTER — Other Ambulatory Visit: Payer: Self-pay

## 2020-06-07 ENCOUNTER — Ambulatory Visit: Payer: Self-pay | Admitting: *Deleted

## 2020-06-07 ENCOUNTER — Emergency Department (HOSPITAL_COMMUNITY)
Admission: EM | Admit: 2020-06-07 | Discharge: 2020-06-08 | Disposition: A | Discharge status: Home / Self Care | Payer: Self-pay | Attending: Emergency Medicine | Admitting: Emergency Medicine

## 2020-06-07 DIAGNOSIS — I10 Essential (primary) hypertension: Secondary | ICD-10-CM | POA: Insufficient documentation

## 2020-06-07 DIAGNOSIS — R200 Anesthesia of skin: Secondary | ICD-10-CM

## 2020-06-07 DIAGNOSIS — Z79899 Other long term (current) drug therapy: Secondary | ICD-10-CM | POA: Insufficient documentation

## 2020-06-07 DIAGNOSIS — Z7982 Long term (current) use of aspirin: Secondary | ICD-10-CM | POA: Insufficient documentation

## 2020-06-07 DIAGNOSIS — R202 Paresthesia of skin: Secondary | ICD-10-CM | POA: Insufficient documentation

## 2020-06-07 DIAGNOSIS — R079 Chest pain, unspecified: Secondary | ICD-10-CM | POA: Insufficient documentation

## 2020-06-07 DIAGNOSIS — R0789 Other chest pain: Secondary | ICD-10-CM

## 2020-06-07 LAB — BASIC METABOLIC PANEL
Anion gap: 10 (ref 5–15)
BUN: 13 mg/dL (ref 6–20)
CO2: 29 mmol/L (ref 22–32)
Calcium: 9.8 mg/dL (ref 8.9–10.3)
Chloride: 99 mmol/L (ref 98–111)
Creatinine, Ser: 1.1 mg/dL — ABNORMAL HIGH (ref 0.44–1.00)
GFR calc Af Amer: >60 mL/min (ref >60)
GFR calc non Af Amer: >60 mL/min (ref >60)
Glucose, Bld: 100 mg/dL — ABNORMAL HIGH (ref 70–99)
Potassium: 3.5 mmol/L (ref 3.5–5.1)
Sodium: 138 mmol/L (ref 135–145)

## 2020-06-07 LAB — CBC
HCT: 42.2 % (ref 36.0–46.0)
Hemoglobin: 14 g/dL (ref 12.0–15.0)
MCH: 28.9 pg (ref 26.0–34.0)
MCHC: 33.2 g/dL (ref 30.0–36.0)
MCV: 87 fL (ref 80.0–100.0)
Platelets: 611 10*3/uL — ABNORMAL HIGH (ref 150–400)
RBC: 4.85 MIL/uL (ref 3.87–5.11)
RDW: 16.9 % — ABNORMAL HIGH (ref 11.5–15.5)
WBC: 13.6 10*3/uL — ABNORMAL HIGH (ref 4.0–10.5)
nRBC: 0 % (ref 0.0–0.2)

## 2020-06-07 LAB — TROPONIN I (HIGH SENSITIVITY)
Troponin I (High Sensitivity): 14 ng/L (ref <18)
Troponin I (High Sensitivity): 9 ng/L (ref <18)

## 2020-06-07 NOTE — ED Triage Notes (Signed)
Pt reports chest pain that radiates to her arm, pt came yesterday but LWBS. Pt a.o, nad noted

## 2020-06-07 NOTE — Telephone Encounter (Signed)
Patient is calling office for appointment- she was advised ED by triage yesterday and she did go- she left after several hours. Advised patient per symptoms she is still having- go back to ED.  Reason for Disposition . [1] Age > 40 AND [2] no obvious cause AND [3] pain even when not moving the arm    (Exception: pain is clearly made worse by moving arm or bending neck)  Answer Assessment - Initial Assessment Questions 1. ONSET: "When did the pain start?"     yesterday 2. LOCATION: "Where is the pain located?"     Left arm- radiates to shoulder and down the leg 3. PAIN: "How bad is the pain?" (Scale 1-10; or mild, moderate, severe)   - MILD (1-3): doesn't interfere with normal activities   - MODERATE (4-7): interferes with normal activities (e.g., work or school) or awakens from sleep   - SEVERE (8-10): excruciating pain, unable to do any normal activities, unable to hold a cup of water     Mild- 1 week 4. WORK OR EXERCISE: "Has there been any recent work or exercise that involved this part of the body?"     no 5. CAUSE: "What do you think is causing the arm pain?"     unsure 6. OTHER SYMPTOMS: "Do you have any other symptoms?" (e.g., neck pain, swelling, rash, fever, numbness, weakness)     Burning on fingers- both sides, weakness in the hand 7. PREGNANCY: "Is there any chance you are pregnant?" "When was your last menstrual period?"     No-LMP-05/17/20  Protocols used: ARM PAIN-A-AH

## 2020-06-07 NOTE — ED Provider Notes (Signed)
[facility] DEPART[facility] Provider Note   CSN: [id number] Arrival date & time: 06/07/20  1521     History Chief Complaint  Patient presents with  . Chest Pain    Alicia Robinson is a 44 y.o. female.  HPI    Patient with a history of prior stroke, prior polysubstance abuse presents with intermittent left-sided chest, arm pain. Onset was about 1 week ago, and since that time she has had episodes of tingling in left arm more than right, and left chest upper pain. Today she has had no pain at all, but has had intermittent odd sensations in her left arm, radiating from her left trapezius. Patient takes aspirin daily, due to history of prior stroke.  She has a history of polysubstance abuse, but stopped following her stroke.  Past Medical History:  Diagnosis Date  . Depression   . Hypertension   . Suicidal overdose [facility])     Patient Active Problem List   Diagnosis Date Noted  . 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 Cannabis use disorder, mild, abuse 09/22/2017  . MDD (major depressive disorder) 09/21/2017  . Hyperlipidemia 07/14/2017  . GERD (gastroesophageal reflux disease) 07/14/2017  . Hypokalemia 07/13/2017  . HTN (hypertension) 07/13/2017  . MDD (major depressive disorder), single episode, severe with psychosis ([place]) 07/11/2017    Past Surgical History:  Procedure Laterality Date  . CHOLECYSTECTOMY    . TUBAL LIGATION       OB History   No obstetric history on file.     Family History  Problem Relation Age of Onset  . Hypertension Mother   . Diabetes Mother   . Hypertension Father   . Diabetes Father   . Diabetes Maternal Grandmother   . Hypertension Maternal Grandmother     Social History   Tobacco Use  . Smoking status: Never Smoker  . Smokeless tobacco: Never Used  Vaping Use  . Vaping Use: Unknown  Substance Use Topics  . Alcohol use: Yes    Comment: weekends  . Drug  use: Yes    Types: Marijuana    Home Medications Prior to Admission medications   Medication Sig Start Date End Date Taking? Authorizing Provider  acetaminophen (TYLENOL) 325 MG tablet Take 2 tablets (650 mg total) by mouth every 6 (six) hours as needed for mild pain. 11/26/18   Connye Burkitt, NP  aspirin EC 81 MG tablet Take 1 tablet (81 mg total) by mouth daily. For heart health 09/24/17   Lindell Spar I, NP  atorvastatin (LIPITOR) 20 MG tablet Take 1 tablet (20 mg total) by mouth daily. For high cholesterol 04/04/20   Camillia Herter, NP  buPROPion (WELLBUTRIN XL) 300 MG 24 hr tablet Take 1 tablet (300 mg total) by mouth daily. For mood 11/27/18   Connye Burkitt, NP  diclofenac sodium (VOLTAREN) 1 % GEL Apply 4 g topically 4 (four) times daily. As needed for joint pain 04/01/19   Fulp, Cammie, MD  doxepin (SINEQUAN) 10 MG capsule  01/14/19   [provider]  hydrochlorothiazide (HYDRODIURIL) 25 MG tablet Take 1 tablet (25 mg total) by mouth daily. For high blood pressure 02/28/20   Camillia Herter, NP  ibuprofen (ADVIL,MOTRIN) 400 MG tablet Take 1 tablet (400 mg total) by mouth every 6 (six) hours as needed for fever, headache or mild pain. Patient not taking: Reported on 11/25/2019 11/26/18   Connye Burkitt, NP  mirtazapine (REMERON) 15  MG tablet Take 1 tablet (15 mg total) by mouth at bedtime. For mood Patient not taking: Reported on 04/01/2019 11/26/18   Connye Burkitt, NP  Multiple Vitamins-Minerals (MULTIVITAMIN ADULT PO) Take 1 tablet by mouth.    [provider]  omeprazole (PRILOSEC) 20 MG capsule Take 1 capsule (20 mg total) by mouth 2 (two) times daily before a meal. 02/28/20   Camillia Herter, NP  perphenazine (TRILAFON) 4 MG tablet Take 1 tablet (4 mg total) by mouth 2 (two) times daily. For psychosis Patient not taking: Reported on 04/01/2019 11/26/18   Connye Burkitt, NP    Allergies    Patient has no known allergies.  Review of Systems   Review of Systems    Constitutional:       Per HPI, otherwise negative  HENT:       Per HPI, otherwise negative  Respiratory:       Per HPI, otherwise negative  Cardiovascular:       Per HPI, otherwise negative  Gastrointestinal: Negative for vomiting.  Endocrine:       Negative aside from HPI  Genitourinary:       Neg aside from HPI   Musculoskeletal:       Per HPI, otherwise negative  Skin: Negative.   Neurological: Negative for syncope.    Physical Exam Updated Vital Signs BP 126/86 (BP Location: Left Arm)   Pulse 85   Temp 98.1 F (36.7 C) (Oral)   Resp 18   Ht 5\' 10"  (1.778 m)   Wt 83.9 kg   SpO2 99%   BMI 26.54 kg/m   Physical Exam Vitals and nursing note reviewed.  Constitutional:      General: She is not in acute distress.    Appearance: She is well-developed.  HENT:     Head: Normocephalic and atraumatic.  Eyes:     Conjunctiva/sclera: Conjunctivae normal.  Cardiovascular:     Rate and Rhythm: Normal rate and regular rhythm.  Pulmonary:     Effort: Pulmonary effort is normal. No respiratory distress.     Breath sounds: Normal breath sounds. No stridor.  Abdominal:     General: There is no distension.  Musculoskeletal:     Comments: Mild discomfort with palpation in this superior brachial plexus area  Skin:    General: Skin is warm and dry.  Neurological:     General: No focal deficit present.     Mental Status: She is alert and oriented to person, place, and time.     Cranial Nerves: Cranial nerves are intact. No cranial nerve deficit.     Motor: No weakness, tremor, atrophy or abnormal muscle tone.     ED Results / Procedures / Treatments   Labs (all labs ordered are listed, but only abnormal results are displayed) Labs Reviewed  BASIC METABOLIC PANEL - Abnormal; Notable for the following components:      Result Value   Glucose, Bld 100 (*)    Creatinine, Ser 1.10 (*)    All other components within normal limits  CBC - Abnormal; Notable for the following  components:   WBC 13.6 (*)    RDW 16.9 (*)    Platelets 611 (*)    All other components within normal limits  TROPONIN I (HIGH SENSITIVITY)  TROPONIN I (HIGH SENSITIVITY)    EKG EKG Interpretation  Date/Time:  Wednesday June 07 2020 15:24:52 EDT Ventricular Rate:  91 PR Interval:  196 QRS Duration: 76 QT Interval:  286 QTC Calculation: 351 R Axis:   56 Text Interpretation: Normal sinus rhythm Cannot rule out Anterior infarct , age undetermined Abnormal ECG When compared to prior, more artifact. no STEMI Confirmed by Antony Blackbird [phone]) on 06/07/2020 6:42:18 PM   Radiology DG Chest 2 View  Result Date: 06/06/2020 CLINICAL DATA:  Left-sided chest pain. EXAM: CHEST - 2 VIEW COMPARISON:  November 18, 2018 FINDINGS: The heart size and mediastinal contours are within normal limits. Both lungs are clear. Radiopaque surgical clips are seen overlying the right upper quadrant. The visualized skeletal structures are unremarkable. IMPRESSION: No active cardiopulmonary disease. Electronically Signed   By: Virgina Norfolk M.D.   On: 06/06/2020 17:48    Procedures Procedures (including critical care time)  Medications Ordered in ED Medications - No data to display  ED Course  I have reviewed the triage vital signs and the nursing notes.  Pertinent labs & imaging results that were available during my care of the patient were reviewed by me and considered in my medical decision making (see chart for details).  Second troponin normal 11:35 PM Patient in no distress, awake, alert, hemodynamically unremarkable, watching television. I reviewed all findings with her including 2 normal troponin, nonischemic EKG, given symptoms for about 1 week, low suspicion for atypical ACS.  Patient is essentially neurovascularly intact as well, but given her history of prior stroke, CT scan was ordered, and is pending on signout. Should this be normal, the patient is appropriate for discharge, which she  is aware, will follow up with primary care. Dr. Leonette Monarch is aware.    Final Clinical Impression(s) / ED Diagnoses Final diagnoses:  Atypical chest pain  Numbness     Carmin Muskrat, MD 06/07/20 [id number]

## 2020-06-07 NOTE — Discharge Instructions (Addendum)
As discussed, your evaluation today has been largely reassuring.  But, it is important that you monitor your condition carefully, and do not hesitate to return to the ED if you develop new, or concerning changes in your condition. ? ?Otherwise, please follow-up with your physician for appropriate ongoing care. ? ?

## 2020-06-13 ENCOUNTER — Other Ambulatory Visit: Payer: Self-pay | Admitting: Obstetrics and Gynecology

## 2020-06-13 ENCOUNTER — Other Ambulatory Visit: Payer: Self-pay

## 2020-06-13 ENCOUNTER — Ambulatory Visit
Admission: RE | Admit: 2020-06-13 | Discharge: 2020-06-13 | Disposition: A | Discharge status: Home / Self Care | Payer: Medicaid Other | Source: Ambulatory Visit | Attending: Obstetrics and Gynecology | Admitting: Obstetrics and Gynecology

## 2020-06-13 ENCOUNTER — Ambulatory Visit: Payer: Self-pay | Admitting: *Deleted

## 2020-06-13 ENCOUNTER — Ambulatory Visit
Admission: RE | Admit: 2020-06-13 | Discharge: 2020-06-13 | Disposition: A | Discharge status: Home / Self Care | Payer: No Typology Code available for payment source | Source: Ambulatory Visit | Attending: Obstetrics and Gynecology | Admitting: Obstetrics and Gynecology

## 2020-06-13 VITALS — BP 130/80 | Temp 97.1°F | Wt 190.5 lb

## 2020-06-13 DIAGNOSIS — N644 Mastodynia: Secondary | ICD-10-CM

## 2020-06-13 DIAGNOSIS — Z1239 Encounter for other screening for malignant neoplasm of breast: Secondary | ICD-10-CM

## 2020-06-13 NOTE — Progress Notes (Addendum)
[name] is a 44 y.o. female who presents to [facility] clinic today with complaint of left breast pain since May 2021 that comes and goes. Patient stated the pain radiates at times to her left arm. Patient rates the pain at a 5-8 out of 10. Patient stated she went to the ED to rule out heart attack or stroke due to the left breast pain radiating.   Pap Smear: Pap not smear completed today. Last Pap smear was 11/25/2019 at [facility] and was normal with negative HPV. Per patient has no history of an abnormal Pap smear. Last Pap smear result is available in Epic.   Physical exam: Breasts Breasts symmetrical. No skin abnormalities bilateral breasts. No nipple retraction bilateral breasts. No nipple discharge bilateral breasts. No lymphadenopathy. No lumps palpated bilateral breasts. Complaints of left breast pain at 3 o'clock and around nipple area on exam.       Pelvic/Bimanual Pap is not indicated today per BCCCP guidelines.   Smoking History: Patient is a former smoker that quit 25 years ago.   Patient Navigation: Patient education provided. Access to services provided for patient through B[facility]CP program.    Breast and Cervical Cancer Risk Assessment: Patient has family history of a maternal great aunt having breast cancer. Patient has no known genetic mutations or history of radiation treatment to the chest before age 67. Patient does not have history of cervical dysplasia, immunocompromised, or DES exposure in-utero.  Risk Assessment    Risk Scores      06/13/2020   Last edited by: Demetrius Revel, LPN   5-year risk: 0.9 %   Lifetime risk: 9.3 %          A: BCCCP exam without pap smear Complaint of left breast pain.  P: Referred patient to the [facility] for a diagnostic mammogram. Appointment scheduled Tuesday, June 13, 2020 at 1520.  Loletta Parish, RN 06/13/2020 1:39 PM

## 2020-06-13 NOTE — Patient Instructions (Signed)
Explained breast self awareness with [name]. Patient did not need a Pap smear today due to last Pap smear and HPV typing  Was 11/25/2019. Let her know B[facility]CP will cover Pap smears and HPV typing every 5 years unless has a history of abnormal Pap smears. Referred patient to the [facility] for a diagnostic mammogram. Appointment scheduled Tuesday, June 13, 2020 at 1520. Patient aware of appointment and will be there. [name] verbalized understanding.  Schneider Warchol, Arvil Chaco, RN 1:39 PM

## 2020-06-28 ENCOUNTER — Other Ambulatory Visit (HOSPITAL_COMMUNITY): Payer: Self-pay | Admitting: Psychiatry

## 2020-06-28 ENCOUNTER — Encounter (HOSPITAL_COMMUNITY): Payer: Self-pay | Admitting: Psychiatry

## 2020-06-28 ENCOUNTER — Telehealth (INDEPENDENT_AMBULATORY_CARE_PROVIDER_SITE_OTHER): Payer: No Payment, Other | Admitting: Psychiatry

## 2020-06-28 DIAGNOSIS — F330 Major depressive disorder, recurrent, mild: Secondary | ICD-10-CM | POA: Diagnosis not present

## 2020-06-28 MED ORDER — TRAZODONE HCL 50 MG PO TABS: 50.0000 mg | ORAL_TABLET | Freq: Every evening | ORAL | 2 refills | Status: DC | PRN

## 2020-06-28 MED ORDER — BUPROPION HCL ER (XL) 300 MG PO TB24: 300.0000 mg | ORAL_TABLET | Freq: Every day | ORAL | 2 refills | Status: DC

## 2020-06-28 MED FILL — traZODone HCL 50 MG TABS: 50 | 30 days supply | Qty: 30 | Fill #0

## 2020-06-28 MED FILL — BUPROPION HCL ER (XL) 300 M: 300 | 30 days supply | Qty: 30 | Fill #0
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
## 2020-06-30 ENCOUNTER — Ambulatory Visit: Payer: Self-pay | Admitting: *Deleted

## 2020-06-30 ENCOUNTER — Ambulatory Visit: Payer: Self-pay | Admitting: Family Medicine

## 2020-06-30 NOTE — Telephone Encounter (Signed)
Attempted to contact patient x3 and left message on voicemail. See previous notes. Left message to call back or to seek assistance at [facility] or ED if symptoms worsen.

## 2020-06-30 NOTE — Telephone Encounter (Signed)
Patient experiencing left arm numbness and tingle sensation, patient received COVID Vaccine in this particular arm. Patient was seen in the ED for 06/07/2020 with the same symptoms, patient never followed up with PCP.  F/U visit scheduled for 07/12/20 left message for patient to call back to review symptoms. [phone].

## 2020-06-30 NOTE — Telephone Encounter (Signed)
Message from Oneta Rack sent at 06/30/2020 4:14 PM EDT  Summary: Clinical Advice   Patient experiencing left arm numbness and tingle sensation, patient received COVID Vaccine in this particular arm. Patient was seen in the ED for 06/07/2020 with the same symptoms, patient never followed up with PCP.   Patient scheduled hospital follow up for 07/12/2020. Seeking clinical advice prior to appt (unable to reach Nurse Triage)

## 2020-07-07 ENCOUNTER — Ambulatory Visit (HOSPITAL_COMMUNITY): Payer: Self-pay | Admitting: Licensed Clinical Social Worker

## 2020-07-12 ENCOUNTER — Other Ambulatory Visit: Payer: Self-pay

## 2020-07-12 ENCOUNTER — Ambulatory Visit: Payer: Medicaid Other | Attending: Family Medicine | Admitting: Internal Medicine

## 2020-07-12 ENCOUNTER — Encounter: Payer: Self-pay | Admitting: Internal Medicine

## 2020-07-12 DIAGNOSIS — F323 Major depressive disorder, single episode, severe with psychotic features: Secondary | ICD-10-CM

## 2020-07-12 DIAGNOSIS — N289 Disorder of kidney and ureter, unspecified: Secondary | ICD-10-CM

## 2020-07-12 DIAGNOSIS — Z2821 Immunization not carried out because of patient refusal: Secondary | ICD-10-CM

## 2020-07-12 NOTE — Assessment & Plan Note (Signed)
She is not suicidal She is generally feeling well but her phq-9 score remains abnormal.  She has f/u with psych and will call any of [place] sooner if she feels like she needs immediate help

## 2020-07-12 NOTE — Patient Instructions (Signed)
You can use ice and heat for the shoulder Stop all ibuprofen and don't take other nsaids (naproxen, etc). I have documented NSAIDs as an "allergy" because they may negatively effect your kidneys.

## 2020-07-12 NOTE — Progress Notes (Signed)
Recent visit to ED with arm pain- reviewed that note- reviewed labs and imaging studies- no significant abnormalities except mild hyperglycemia and elevated creatinine.   She has had left arm pain for approximately two months. Pain comes and goes. Pain is worsened with supination. She admits that there is associated tingling of finger tips. She states "all of the finger tips". There is no associated weakness.   Past Medical History:  Diagnosis Date  . Depression   . Hyperlipidemia   . Hypertension   . Suicidal overdose ([place])     Social History   Socioeconomic History  . Marital status: Single    Spouse name: Not on file  . Number of children: 3  . Years of education: Not on file  . Highest education level: Associate degree: occupational, Hotel manager, or vocational program  Occupational History  . Not on file  Tobacco Use  . Smoking status: Never Smoker  . Smokeless tobacco: Never Used  Vaping Use  . Vaping Use: Never used  Substance and Sexual Activity  . Alcohol use: Yes    Comment: weekends, socially  . Drug use: Not Currently    Types: Marijuana  . Sexual activity: Not Currently  Other Topics Concern  . Not on file  Social History Narrative  . Not on file   Social Determinants of Health   Financial Resource Strain:   . Difficulty of Paying Living Expenses: Not on file  Food Insecurity:   . Worried About Charity fundraiser in the Last Year: Not on file  . Ran Out of Food in the Last Year: Not on file  Transportation Needs: No Transportation Needs  . Lack of Transportation (Medical): No  . Lack of Transportation (Non-Medical): No  Physical Activity:   . Days of Exercise per Week: Not on file  . Minutes of Exercise per Session: Not on file  Stress:   . Feeling of Stress : Not on file  Social Connections:   . Frequency of Communication with Friends and Family: Not on file  . Frequency of Social Gatherings with Friends and Family: Not on file  . Attends Religious  Services: Not on file  . Active Member of Clubs or Organizations: Not on file  . Attends Archivist Meetings: Not on file  . Marital Status: Not on file  Intimate Partner Violence:   . Fear of Current or Ex-Partner: Not on file  . Emotionally Abused: Not on file  . Physically Abused: Not on file  . Sexually Abused: Not on file    Past Surgical History:  Procedure Laterality Date  . CHOLECYSTECTOMY    . TUBAL LIGATION      Family History  Problem Relation Age of Onset  . Hypertension Mother   . Diabetes Mother   . Hypertension Father   . Diabetes Father   . Diabetes Maternal Grandmother   . Hypertension Maternal Grandmother     No Known Allergies  Current Outpatient Medications on File Prior to Visit  Medication Sig Dispense Refill  . aspirin EC 81 MG tablet Take 1 tablet (81 mg total) by mouth daily. For heart health    . atorvastatin (LIPITOR) 20 MG tablet Take 1 tablet (20 mg total) by mouth daily. For high cholesterol 120 tablet 0  . buPROPion (WELLBUTRIN XL) 300 MG 24 hr tablet Take 1 tablet (300 mg total) by mouth daily. For mood 30 tablet 2  . hydrochlorothiazide (HYDRODIURIL) 25 MG tablet Take 1 tablet (25 mg total)  by mouth daily. For high blood pressure 90 tablet 0  . ibuprofen (ADVIL,MOTRIN) 400 MG tablet Take 1 tablet (400 mg total) by mouth every 6 (six) hours as needed for fever, headache or mild pain.  0  . Multiple Vitamins-Minerals (MULTIVITAMIN ADULT PO) Take 1 tablet by mouth.    Marland Kitchen omeprazole (PRILOSEC) 20 MG capsule Take 1 capsule (20 mg total) by mouth 2 (two) times daily before a meal. 180 capsule 0  . traZODone (DESYREL) 50 MG tablet Take 1 tablet (50 mg total) by mouth at bedtime as needed for sleep. 30 tablet 2  . doxepin (SINEQUAN) 10 MG capsule  (Patient not taking: Reported on 06/13/2020)     No current facility-administered medications on file prior to visit.     patient denies chest pain, shortness of breath, orthopnea. Denies lower  extremity edema, abdominal pain, change in appetite, change in bowel movements. Patient denies rashes, musculoskeletal complaints. No other specific complaints in a complete review of systems.   BP 130/88   Pulse 75   Resp 16   Ht 5\' 10"  (1.778 m)   Wt 190 lb (86.2 kg)   SpO2 95%   BMI 27.26 kg/m   Well-developed well-nourished female in no acute distress. HEENT exam atraumatic, normocephalic, extraocular muscles are intact. Neck is supple. No jugular venous distention no thyromegaly. Chest clear to auscultation without increased work of breathing. Cardiac exam S1 and S2 are regular. Abdominal exam active bowel sounds, soft, nontender. Extremities no edema. Neurologic exam she is alert without any motor sensory deficits. Gait is normal.  Renal insufficiency I suspect related to HTN I have asked her to stop ibuprofen  MDD (major depressive disorder), single episode, severe with psychosis ([place]) She is not suicidal She is generally feeling well but her phq-9 score remains abnormal.  She has f/u with psych and will call any of [place] sooner if she feels like she needs immediate help  Arm pain- I think she has slight inflammation in shoulder- no treatment other than hea/ice and acetaminophen

## 2020-07-12 NOTE — Assessment & Plan Note (Signed)
I suspect related to HTN I have asked her to stop ibuprofen

## 2020-07-17 ENCOUNTER — Other Ambulatory Visit: Payer: Self-pay | Admitting: Family

## 2020-07-17 DIAGNOSIS — I10 Essential (primary) hypertension: Secondary | ICD-10-CM

## 2020-07-17 NOTE — Telephone Encounter (Signed)
Please fill for patient if appropriate to continue as PCP.

## 2020-07-18 ENCOUNTER — Other Ambulatory Visit: Payer: Self-pay

## 2020-07-18 ENCOUNTER — Ambulatory Visit: Payer: Medicaid Other | Attending: Family Medicine

## 2020-07-31 MED FILL — BUPROPION HCL ER (XL) 300 M: 300 | 30 days supply | Qty: 30 | Fill #1

## 2020-08-18 ENCOUNTER — Other Ambulatory Visit: Payer: Self-pay | Admitting: Family

## 2020-08-18 ENCOUNTER — Other Ambulatory Visit: Payer: Self-pay | Admitting: Family Medicine

## 2020-08-18 DIAGNOSIS — E785 Hyperlipidemia, unspecified: Secondary | ICD-10-CM

## 2020-08-18 MED ORDER — ATORVASTATIN CALCIUM 20 MG PO TABS: 20.0000 mg | ORAL_TABLET | Freq: Every day | ORAL | 0 refills | Status: DC

## 2020-08-18 MED FILL — ATORVASTATIN CALCIUM 20 MG: 20 | 30 days supply | Qty: 30 | Fill #0

## 2020-08-18 NOTE — Telephone Encounter (Signed)
Medication: atorvastatin (LIPITOR) 20 MG tablet [[id number]  Has the patient contacted their pharmacy? Yes  (Agent: If no, request that the patient contact the pharmacy for the refill.) (Agent: If yes, when and what did the pharmacy advise?)  Preferred Pharmacy (with phone number or street name): [facility], [address] [place] [address] Phone: [phone] Fax: [phone] Hours: Not open 24 hours    Agent: Please be advised that RX refills may take up to 3 business days. We ask that you follow-up with your pharmacy.

## 2020-09-18 MED FILL — ?ATORVASTATIN 20 MG TABLET: 20 | 30 days supply | Qty: 30 | Fill #1

## 2020-09-20 MED FILL — BUPROPION HCL ER (XL) 300 M: 300 | 30 days supply | Qty: 30 | Fill #2

## 2020-09-28 ENCOUNTER — Encounter (HOSPITAL_COMMUNITY): Payer: Self-pay | Admitting: Psychiatry

## 2020-09-28 ENCOUNTER — Telehealth (INDEPENDENT_AMBULATORY_CARE_PROVIDER_SITE_OTHER): Payer: No Payment, Other | Admitting: Psychiatry

## 2020-09-28 ENCOUNTER — Other Ambulatory Visit: Payer: Self-pay

## 2020-09-28 ENCOUNTER — Other Ambulatory Visit (HOSPITAL_COMMUNITY): Payer: Self-pay | Admitting: Psychiatry

## 2020-09-28 DIAGNOSIS — F330 Major depressive disorder, recurrent, mild: Secondary | ICD-10-CM | POA: Diagnosis not present

## 2020-09-28 MED ORDER — BUPROPION HCL ER (XL) 300 MG PO TB24: 300.0000 mg | ORAL_TABLET | Freq: Every day | ORAL | 2 refills | Status: DC

## 2020-09-28 MED ORDER — TRAZODONE HCL 50 MG PO TABS: 50.0000 mg | ORAL_TABLET | Freq: Every evening | ORAL | 2 refills | Status: DC | PRN

## 2020-09-28 MED FILL — ?TRAZODONE HCL 50 TABS: 50 | 30 days supply | Qty: 30 | Fill #0

## 2020-09-28 NOTE — Progress Notes (Signed)
BH MD/PA/NP OP Progress Note Virtual Visit via Telephone Note  I connected with Alicia Robinson on 09/28/20 at  8:30 AM EST by telephone and verified that I am speaking with the correct person using two identifiers.  Location: Patient: home Provider: Clinic   I discussed the limitations, risks, security and privacy concerns of performing an evaluation and management service by telephone and the availability of in person appointments. I also discussed with the patient that there may be a patient responsible charge related to this service. The patient expressed understanding and agreed to proceed.   I provided 25 minutes of non-face-to-face time during this encounter.   09/28/2020 8:37 AM Alicia Robinson  MRN:  [id number]  Chief Complaint: "Things are good"  HPI: 45 year old female in today for follow up psychiatric evaluation. She has a psychiatric history of depression, anxiety, and suicidal ideation. She is currently managed on Wellbutrin 300 mg daily and Trazodone 50 mg nightly. She notes her medications are effective in managing her psychiatric conditions.   Today she is unable to login virtually so her exam was done via telephone.  During exam she is  pleasant, cooperative, and engaged in [place]. She informed Probation officer that since her last visit things have been going well. She notes that she started working night shifts at [employer]. She notes that she has minimal anxiety and depression. She notes that she sleeps 6-7 hours nightly. She denies SI/HI/VAH, mania, or paranoia.   No medication changes mad today. Patient agreeable to continue medication as prescribed. No other concerns noted at this time.    Visit Diagnosis:    ICD-10-CM   1. Mild episode of recurrent major depressive disorder (HCC)  F33.0 buPROPion (WELLBUTRIN XL) 300 MG 24 hr tablet    traZODone (DESYREL) 50 MG tablet    Past Psychiatric History: depression, anxiety, and suicidal ideation.  Past Medical  History:  Past Medical History:  Diagnosis Date  . Depression   . Hyperlipidemia   . Hypertension   . Suicidal overdose [facility])     Past Surgical History:  Procedure Laterality Date  . CHOLECYSTECTOMY    . TUBAL LIGATION      Family Psychiatric History: Mother depression  Family History:  Family History  Problem Relation Age of Onset  . Hypertension Mother   . Diabetes Mother   . Hypertension Father   . Diabetes Father   . Diabetes Maternal Grandmother   . Hypertension Maternal Grandmother     Social History:  Social History   Socioeconomic History  . Marital status: Single    Spouse name: Not on file  . Number of children: 3  . Years of education: Not on file  . Highest education level: Associate degree: occupational, Hotel manager, or vocational program  Occupational History  . Not on file  Tobacco Use  . Smoking status: Never Smoker  . Smokeless tobacco: Never Used  Vaping Use  . Vaping Use: Never used  Substance and Sexual Activity  . Alcohol use: Yes    Comment: weekends, socially  . Drug use: Not Currently    Types: Marijuana  . Sexual activity: Not Currently  Other Topics Concern  . Not on file  Social History Narrative  . Not on file   Social Determinants of Health   Financial Resource Strain: Not on file  Food Insecurity: Not on file  Transportation Needs: No Transportation Needs  . Lack of Transportation (Medical): No  . Lack of Transportation (Non-Medical): No  Physical Activity:  Not on file  Stress: Not on file  Social Connections: Not on file    Allergies:  Allergies  Allergen Reactions  . Nsaids     Renal insufficiency    Metabolic Disorder Labs: Lab Results  Component Value Date   HGBA1C 5.7 (H) 03/10/2020   MPG 108.28 11/19/2018   MPG 105.41 09/22/2017   No results found for: PROLACTIN Lab Results  Component Value Date   CHOL 267 (H) 03/10/2020   TRIG 119 03/10/2020   HDL 56 03/10/2020   CHOLHDL 4.8 (H) 03/10/2020   VLDL  16 11/19/2018   LDLCALC 190 (H) 03/10/2020   LDLCALC 77 11/19/2018   Lab Results  Component Value Date   TSH 1.000 03/10/2020   TSH 1.578 11/19/2018    Therapeutic Level Labs: No results found for: LITHIUM No results found for: VALPROATE No components found for:  CBMZ  Current Medications: Current Outpatient Medications  Medication Sig Dispense Refill  . aspirin EC 81 MG tablet Take 1 tablet (81 mg total) by mouth daily. For heart health    . atorvastatin (LIPITOR) 20 MG tablet Take 1 tablet (20 mg total) by mouth daily. For high cholesterol 120 tablet 0  . buPROPion (WELLBUTRIN XL) 300 MG 24 hr tablet Take 1 tablet (300 mg total) by mouth daily. For mood 30 tablet 2  . hydrochlorothiazide (HYDRODIURIL) 25 MG tablet TAKE 1 TABLET BY MOUTH  DAILY FOR HIGH BLOOD PRESSURE 90 tablet 0  . Multiple Vitamins-Minerals (MULTIVITAMIN ADULT PO) Take 1 tablet by mouth.    Marland Kitchen omeprazole (PRILOSEC) 20 MG capsule Take 1 capsule (20 mg total) by mouth 2 (two) times daily before a meal. 180 capsule 0  . traZODone (DESYREL) 50 MG tablet Take 1 tablet (50 mg total) by mouth at bedtime as needed for sleep. 30 tablet 2   No current facility-administered medications for this visit.     Musculoskeletal: Strength & Muscle Tone: Unable to assess due to telehealth visit [place]: Unable to assess due to telehealth visit Patient leans: N/A  Psychiatric Specialty Exam: Review of Systems  There were no vitals taken for this visit.There is no height or weight on file to calculate BMI.  General Appearance: Unable to assess due to telehealth visit  Eye Contact:  Unable to assess due to telehealth visit  Speech:  Clear and Coherent and Normal Rate  Volume:  Normal  Mood:  Euthymic  Affect:  Appropriate and Congruent  Thought Process:  Coherent, Goal Directed and Linear  Orientation:  Full (Time, Place, and Person)  Thought Content: WDL and Logical   Suicidal Thoughts:  No  Homicidal Thoughts:  No   Memory:  Immediate;   Good Recent;   Good Remote;   Good  Judgement:  Good  Insight:  Good  Psychomotor Activity:  Normal  Concentration:  Concentration: Good and Attention Span: Good  Recall:  Good  Fund of Knowledge: Good  Language: Good  Akathisia:  No  Handed:  Right  AIMS (if indicated): Not done  Assets:  Communication Skills Desire for Improvement Financial Resources/Insurance Housing Social Support  ADL's:  Intact  Cognition: WNL  Sleep:  Good   Screenings: AIMS   Flowsheet Row Admission (Discharged) from 11/19/2018 in [place] [facility] Admission (Discharged) from 09/21/2017 in [place] [facility] Admission (Discharged) from OP Visit from 07/11/2017 in [place] [facility]  AIMS Total Score 0 0 0    AUDIT  Flowsheet Row Admission (Discharged) from 11/19/2018 in [place] [facility] Admission (Discharged) from OP Visit from 07/11/2017 in [place] [facility]  Alcohol Use Disorder Identification Test Final Score (AUDIT) 0 1    GAD-7   Flowsheet Row Office Visit from 07/12/2020 in [place] Office Visit from 11/25/2019 in [place] Office Visit from 04/01/2019 in [place]  Total GAD-7 Score 6 10 4     PHQ2-9   [place] Office Visit from 07/12/2020 in [place] Office Visit from 11/25/2019 in [place] Office Visit from 04/01/2019 in [place]  PHQ-2 Total Score 5 2 2   PHQ-9 Total Score 12 8 4        Assessment and Plan: Patient notes that she is doing well on her current medication regimen.  No medication changes made.  Patient agreeable to continue medications as prescribed.  1. Mild episode of recurrent major depressive disorder (HCC)  Continue-  buPROPion (WELLBUTRIN XL) 300 MG 24 hr tablet; Take 1 tablet (300 mg total) by mouth daily. For mood  Dispense: 30 tablet; Refill: 2 Continue- traZODone (DESYREL) 50 MG tablet; Take 1 tablet (50 mg total) by mouth at bedtime as needed for sleep.  Dispense: 30 tablet; Refill: 2  Follow up in 3 months   Salley Slaughter, NP 09/28/2020, 8:37 AM

## 2020-10-27 ENCOUNTER — Other Ambulatory Visit: Payer: Self-pay | Admitting: Family Medicine

## 2020-10-27 ENCOUNTER — Other Ambulatory Visit: Payer: Self-pay | Admitting: General Practice

## 2020-10-27 DIAGNOSIS — I10 Essential (primary) hypertension: Secondary | ICD-10-CM

## 2020-10-27 MED ORDER — HYDROCHLOROTHIAZIDE 25 MG PO TABS: 25.0000 mg | ORAL_TABLET | Freq: Every day | ORAL | 0 refills | Status: DC

## 2020-10-27 MED FILL — BUPROPION HCL ER (XL) 300 M: 300 | 30 days supply | Qty: 30 | Fill #0

## 2020-10-27 MED FILL — HYDROCHLOROTHIAZIDE 25 MG T: 25 | 30 days supply | Qty: 30 | Fill #0

## 2020-10-27 MED FILL — ?ATORVASTATIN 20 MG TABLET: 20 | 30 days supply | Qty: 30 | Fill #2

## 2020-10-27 NOTE — Telephone Encounter (Signed)
Copied from [place] [id number]. Topic: Quick Communication - Rx Refill/Question >> Oct 27, 2020 11:27 AM Tessa Lerner A wrote: Medication: hydrochlorothiazide (HYDRODIURIL) 25 MG   Has the patient contacted their pharmacy? Yes. Patient has been in contact with pharmacy and was redirected to contact PCP  Preferred Pharmacy (with phone number or street name): [place], [place]  Phone:  [phone]  Agent: Please be advised that RX refills may take up to 3 business days. We ask that you follow-up with your pharmacy.

## 2020-10-27 NOTE — Telephone Encounter (Signed)
Requested medication (s) are due for refill today: yes  Requested medication (s) are on the active medication list: yes  Last refill:  Last filled by Dr. Chapman Fitch  Future visit scheduled: no  Notes to clinic:  Please review for refill. Last filled by provider that is no longer at the practice    Requested Prescriptions  Pending Prescriptions Disp Refills   hydrochlorothiazide (HYDRODIURIL) 25 MG tablet 90 tablet 0    Sig: Take 1 tablet (25 mg total) by mouth daily. for high blood pressure      Cardiovascular: Diuretics - Thiazide Failed - 10/27/2020 11:31 AM      Failed - Cr in normal range and within 360 days    Creatinine, Ser  Date Value Ref Range Status  06/07/2020 1.10 (H) 0.44 - 1.00 mg/dL Final          Passed - Ca in normal range and within 360 days    Calcium  Date Value Ref Range Status  06/07/2020 9.8 8.9 - 10.3 mg/dL Final          Passed - K in normal range and within 360 days    Potassium  Date Value Ref Range Status  06/07/2020 3.5 3.5 - 5.1 mmol/L Final          Passed - Na in normal range and within 360 days    Sodium  Date Value Ref Range Status  06/07/2020 138 135 - 145 mmol/L Final  03/10/2020 136 134 - 144 mmol/L Final          Passed - Last BP in normal range    BP Readings from Last 1 Encounters:  07/12/20 130/88          Passed - Valid encounter within last 6 months    Recent Outpatient Visits           3 months ago Influenza vaccine refused   [place] Swords, Darrick Penna, MD   8 months ago Essential hypertension   [place], [place], NP   11 months ago Encounter for annual health examination   [facility] [place], [place], MD   1 year ago Essential hypertension   [facility] [place], [place], MD

## 2020-12-06 ENCOUNTER — Other Ambulatory Visit: Payer: Self-pay | Admitting: Family Medicine

## 2020-12-06 DIAGNOSIS — I10 Essential (primary) hypertension: Secondary | ICD-10-CM

## 2020-12-06 MED FILL — BUPROPION HCL ER (XL) 300 M: 300 | 30 days supply | Qty: 30 | Fill #1

## 2020-12-06 MED FILL — traZODone HCL 50 MG TABS: 50 | 30 days supply | Qty: 30 | Fill #0

## 2020-12-06 MED FILL — HYDROCHLOROTHIAZIDE 25 MG T: 25 | 30 days supply | Qty: 30 | Fill #0

## 2020-12-06 MED FILL — ?ATORVASTATIN 20 MG TABLET: 20 | 30 days supply | Qty: 30 | Fill #3

## 2020-12-27 ENCOUNTER — Other Ambulatory Visit: Payer: Self-pay

## 2020-12-27 ENCOUNTER — Telehealth (INDEPENDENT_AMBULATORY_CARE_PROVIDER_SITE_OTHER): Payer: No Payment, Other | Admitting: Psychiatry

## 2020-12-27 ENCOUNTER — Encounter (HOSPITAL_COMMUNITY): Payer: Self-pay | Admitting: Psychiatry

## 2020-12-27 DIAGNOSIS — F330 Major depressive disorder, recurrent, mild: Secondary | ICD-10-CM | POA: Diagnosis not present

## 2020-12-27 MED ORDER — TRAZODONE HCL 50 MG PO TABS
ORAL_TABLET | Freq: Every evening | ORAL | 2 refills | Status: DC | PRN
  Filled 2020-12-27 – 2021-01-16 (×2): qty 30, 30d supply, fill #0
  Filled 2021-03-08: qty 30, 30d supply, fill #1

## 2020-12-27 MED ORDER — BUPROPION HCL ER (XL) 300 MG PO TB24
ORAL_TABLET | ORAL | 2 refills | Status: DC
Start: 2020-12-27 — End: 2021-03-28
  Filled 2020-12-27 – 2021-01-16 (×2): qty 30, 30d supply, fill #0
  Filled 2021-03-08: qty 30, 30d supply, fill #1

## 2020-12-27 NOTE — Progress Notes (Signed)
BH MD/PA/NP OP Progress Note Virtual Visit via Telephone Note  I connected with Alicia Robinson on 12/27/20 at  1:00 PM EDT by telephone and verified that I am speaking with the correct person using two identifiers.  Location: Patient: home Provider: Clinic   I discussed the limitations, risks, security and privacy concerns of performing an evaluation and management service by telephone and the availability of in person appointments. I also discussed with the patient that there may be a patient responsible charge related to this service. The patient expressed understanding and agreed to proceed.   I provided 25 minutes of non-face-to-face time during this encounter.   12/27/2020 1:17 PM Alicia Robinson  MRN:  [id number]  Chief Complaint: "The medications the working"  HPI: 45 year old female in today for follow up psychiatric evaluation. She has a psychiatric history of depression, anxiety, and suicidal ideation. She is currently managed on Wellbutrin 300 mg daily and Trazodone 50 mg nightly. She notes her medications are effective in managing her psychiatric conditions.   Today she was pleasant, cooperative, engaged in conversation, and maintained eye contact.  She informed Probation officer that things have been going well.  She notes that she started working at [employer] 3 months ago and is enjoying the benefits.  She informed provider that she has been able to visit her mother 3 times within the last 3 months.  She informed Probation officer that her anxiety and depression continues to be minimal.  Today provider conducted a GAD-7 and patient scored a 0.  Provider also conducted a PHQ-9 and patient scored a 1.  She endorses adequate sleep and appetite.  She notes that recently she has lost 10 pounds because she has been fasting for lent.  Today she denies SI/HI/VAH, mania, or paranoia.   No medication changes mad today. Patient agreeable to continue medication as prescribed. No other concerns noted at this  time.    Visit Diagnosis:    ICD-10-CM   1. Mild episode of recurrent major depressive disorder (HCC)  F33.0 traZODone (DESYREL) 50 MG tablet    buPROPion (WELLBUTRIN XL) 300 MG 24 hr tablet    Past Psychiatric History: depression, anxiety, and suicidal ideation.  Past Medical History:  Past Medical History:  Diagnosis Date  . Depression   . Hyperlipidemia   . Hypertension   . Suicidal overdose [facility])     Past Surgical History:  Procedure Laterality Date  . CHOLECYSTECTOMY    . TUBAL LIGATION      Family Psychiatric History: Mother depression  Family History:  Family History  Problem Relation Age of Onset  . Hypertension Mother   . Diabetes Mother   . Hypertension Father   . Diabetes Father   . Diabetes Maternal Grandmother   . Hypertension Maternal Grandmother     Social History:  Social History   Socioeconomic History  . Marital status: Single    Spouse name: Not on file  . Number of children: 3  . Years of education: Not on file  . Highest education level: Associate degree: occupational, Hotel manager, or vocational program  Occupational History  . Not on file  Tobacco Use  . Smoking status: Never Smoker  . Smokeless tobacco: Never Used  Vaping Use  . Vaping Use: Never used  Substance and Sexual Activity  . Alcohol use: Yes    Comment: weekends, socially  . Drug use: Not Currently    Types: Marijuana  . Sexual activity: Not Currently  Other Topics Concern  .  Not on file  Social History Narrative  . Not on file   Social Determinants of Health   Financial Resource Strain: Not on file  Food Insecurity: Not on file  Transportation Needs: No Transportation Needs  . Lack of Transportation (Medical): No  . Lack of Transportation (Non-Medical): No  Physical Activity: Not on file  Stress: Not on file  Social Connections: Not on file    Allergies:  Allergies  Allergen Reactions  . Nsaids     Renal insufficiency    Metabolic Disorder Labs: Lab  Results  Component Value Date   HGBA1C 5.7 (H) 03/10/2020   MPG 108.28 11/19/2018   MPG 105.41 09/22/2017   No results found for: PROLACTIN Lab Results  Component Value Date   CHOL 267 (H) 03/10/2020   TRIG 119 03/10/2020   HDL 56 03/10/2020   CHOLHDL 4.8 (H) 03/10/2020   VLDL 16 11/19/2018   LDLCALC 190 (H) 03/10/2020   LDLCALC 77 11/19/2018   Lab Results  Component Value Date   TSH 1.000 03/10/2020   TSH 1.578 11/19/2018    Therapeutic Level Labs: No results found for: LITHIUM No results found for: VALPROATE No components found for:  CBMZ  Current Medications: Current Outpatient Medications  Medication Sig Dispense Refill  . aspirin EC 81 MG tablet Take 1 tablet (81 mg total) by mouth daily. For heart health    . atorvastatin (LIPITOR) 20 MG tablet TAKE 1 TABLET (20 MG TOTAL) BY MOUTH DAILY. FOR HIGH CHOLESTEROL 120 tablet 0  . buPROPion (WELLBUTRIN XL) 300 MG 24 hr tablet TAKE 1 TABLET (300 MG TOTAL) BY MOUTH DAILY. FOR MOOD 30 tablet 2  . hydrochlorothiazide (HYDRODIURIL) 25 MG tablet TAKE 1 TABLET (25 MG TOTAL) BY MOUTH DAILY. FOR HIGH BLOOD PRESSURE 30 tablet 0  . Multiple Vitamins-Minerals (MULTIVITAMIN ADULT PO) Take 1 tablet by mouth.    Marland Kitchen omeprazole (PRILOSEC) 20 MG capsule Take 1 capsule (20 mg total) by mouth 2 (two) times daily before a meal. 180 capsule 0  . traZODone (DESYREL) 50 MG tablet TAKE 1 TABLET (50 MG TOTAL) BY MOUTH AT BEDTIME AS NEEDED FOR SLEEP. 30 tablet 2   No current facility-administered medications for this visit.     Musculoskeletal: Strength & Muscle Tone: Unable to assess due to telehealth visit [place]: Unable to assess due to telehealth visit Patient leans: N/A  Psychiatric Specialty Exam: Review of Systems  There were no vitals taken for this visit.There is no height or weight on file to calculate BMI.  General Appearance: Well Groomed  Eye Contact:  Good  Speech:  Clear and Coherent and Normal Rate  Volume:  Normal   Mood:  Euthymic  Affect:  Appropriate and Congruent  Thought Process:  Coherent, Goal Directed and Linear  Orientation:  Full (Time, Place, and Person)  Thought Content: WDL and Logical   Suicidal Thoughts:  No  Homicidal Thoughts:  No  Memory:  Immediate;   Good Recent;   Good Remote;   Good  Judgement:  Good  Insight:  Good  Psychomotor Activity:  Normal  Concentration:  Concentration: Good and Attention Span: Good  Recall:  Good  Fund of Knowledge: Good  Language: Good  Akathisia:  No  Handed:  Right  AIMS (if indicated): Not done  Assets:  Communication Skills Desire for Improvement Financial Resources/Insurance Housing Social Support  ADL's:  Intact  Cognition: WNL  Sleep:  Good   Screenings: [place] Admission (Discharged)  from 11/19/2018 in [place] [facility] Admission (Discharged) from 09/21/2017 in [place] [facility] Admission (Discharged) from OP Visit from 07/11/2017 in [place] [facility]  AIMS Total Score 0 0 0    AUDIT   Flowsheet Row Admission (Discharged) from 11/19/2018 in [place] [facility] Admission (Discharged) from OP Visit from 07/11/2017 in [place] [facility]  Alcohol Use Disorder Identification Test Final Score (AUDIT) 0 1    GAD-7   Flowsheet Row Video [facility] 12/27/2020 in [facility] Office Visit from 07/12/2020 in [place] Office Visit from 11/25/2019 in [place] Office Visit from 04/01/2019 in [place]  Total GAD-7 Score 0 6 10 4     PHQ2-9   Flowsheet Row Video Visit from 12/27/2020 in [facility] Office Visit from 07/12/2020 in [place] Office Visit from 11/25/2019 in [place] Office  Visit from 04/01/2019 in [place]  PHQ-2 Total Score 0 5 2 2   PHQ-9 Total Score 1 12 8 4     Flowsheet Row Video Visit from 12/27/2020 in [facility] Admission (Discharged) from 11/19/2018 in [place] [facility] ED from 11/18/2018 in [place] DEPT  C-SSRS RISK CATEGORY No Risk Low Risk High Risk       Assessment and Plan: Patient notes that she is doing well on her current medication regimen.  No medication changes made.  Patient agreeable to continue medications as prescribed.  1. Mild episode of recurrent major depressive disorder (HCC)  Continue- buPROPion (WELLBUTRIN XL) 300 MG 24 hr tablet; Take 1 tablet (300 mg total) by mouth daily. For mood  Dispense: 30 tablet; Refill: 2 Continue- traZODone (DESYREL) 50 MG tablet; Take 1 tablet (50 mg total) by mouth at bedtime as needed for sleep.  Dispense: 30 tablet; Refill: 2  Follow up in 3 months   Salley Slaughter, NP 12/27/2020, 1:17 PM

## 2020-12-28 ENCOUNTER — Encounter (HOSPITAL_COMMUNITY): Payer: Self-pay | Admitting: Psychiatry

## 2021-01-03 ENCOUNTER — Other Ambulatory Visit: Payer: Self-pay

## 2021-01-16 ENCOUNTER — Other Ambulatory Visit: Payer: Self-pay | Admitting: Family Medicine

## 2021-01-16 ENCOUNTER — Other Ambulatory Visit: Payer: Self-pay

## 2021-01-16 ENCOUNTER — Other Ambulatory Visit: Payer: Self-pay | Admitting: Family

## 2021-01-16 DIAGNOSIS — E785 Hyperlipidemia, unspecified: Secondary | ICD-10-CM

## 2021-01-16 DIAGNOSIS — I10 Essential (primary) hypertension: Secondary | ICD-10-CM

## 2021-01-16 NOTE — Telephone Encounter (Signed)
Requested medication (s) are due for refill today: yes  Requested medication (s) are on the active medication list: yes  Last refill: 12/06/2020  Future visit scheduled: no  Notes to clinic:  due for follow up Message has been sent to patient to contact office    Requested Prescriptions  Pending Prescriptions Disp Refills   hydrochlorothiazide (HYDRODIURIL) 25 MG tablet 30 tablet 0    Sig: TAKE 1 TABLET (25 MG TOTAL) BY MOUTH DAILY. FOR HIGH BLOOD PRESSURE      Cardiovascular: Diuretics - Thiazide Failed - 01/16/2021 11:42 AM      Failed - Cr in normal range and within 360 days    Creatinine, Ser  Date Value Ref Range Status  06/07/2020 1.10 (H) 0.44 - 1.00 mg/dL Final          Failed - Valid encounter within last 6 months    Recent Outpatient Visits           6 months ago Influenza vaccine refused   [place] Swords, Darrick Penna, MD   10 months ago Essential hypertension   [place], [place] J, NP   1 year ago Encounter for annual health examination   [place], [place], MD   1 year ago Essential hypertension   [place], [place], MD                Passed - Ca in normal range and within 360 days    Calcium  Date Value Ref Range Status  06/07/2020 9.8 8.9 - 10.3 mg/dL Final          Passed - K in normal range and within 360 days    Potassium  Date Value Ref Range Status  06/07/2020 3.5 3.5 - 5.1 mmol/L Final          Passed - Na in normal range and within 360 days    Sodium  Date Value Ref Range Status  06/07/2020 138 135 - 145 mmol/L Final  03/10/2020 136 134 - 144 mmol/L Final          Passed - Last BP in normal range    BP Readings from Last 1 Encounters:  07/12/20 130/88

## 2021-01-17 ENCOUNTER — Other Ambulatory Visit: Payer: Self-pay

## 2021-01-18 ENCOUNTER — Other Ambulatory Visit: Payer: Self-pay

## 2021-01-24 IMAGING — MG DIGITAL DIAGNOSTIC BILAT W/ TOMO W/ CAD
6 of 10 series · 6 of 30 positions shown · non-contrast
Comparison: Previous exam(s).

CLINICAL DATA: 43-year-old female with focal, intermittent pain of
the left breast for 2 months.

EXAM:
DIGITAL DIAGNOSTIC BILATERAL MAMMOGRAM WITH CAD AND TOMO
ULTRASOUND LEFT BREAST

[R MLO synth-2D]
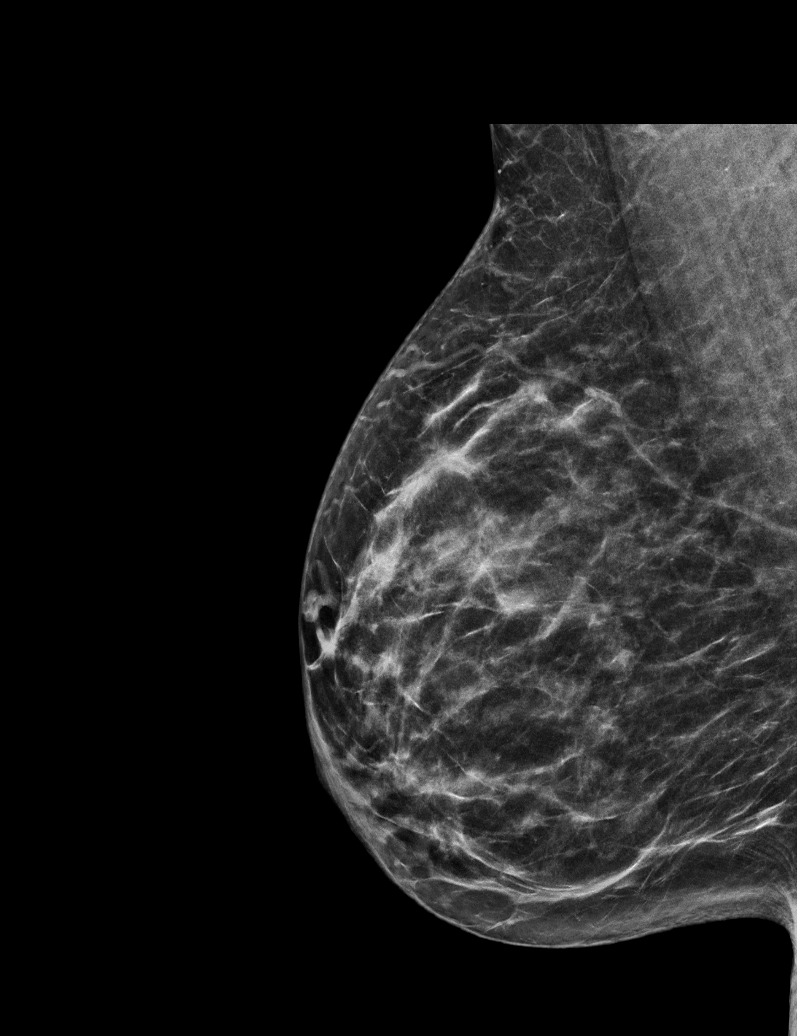

[L TAN synth-2D]
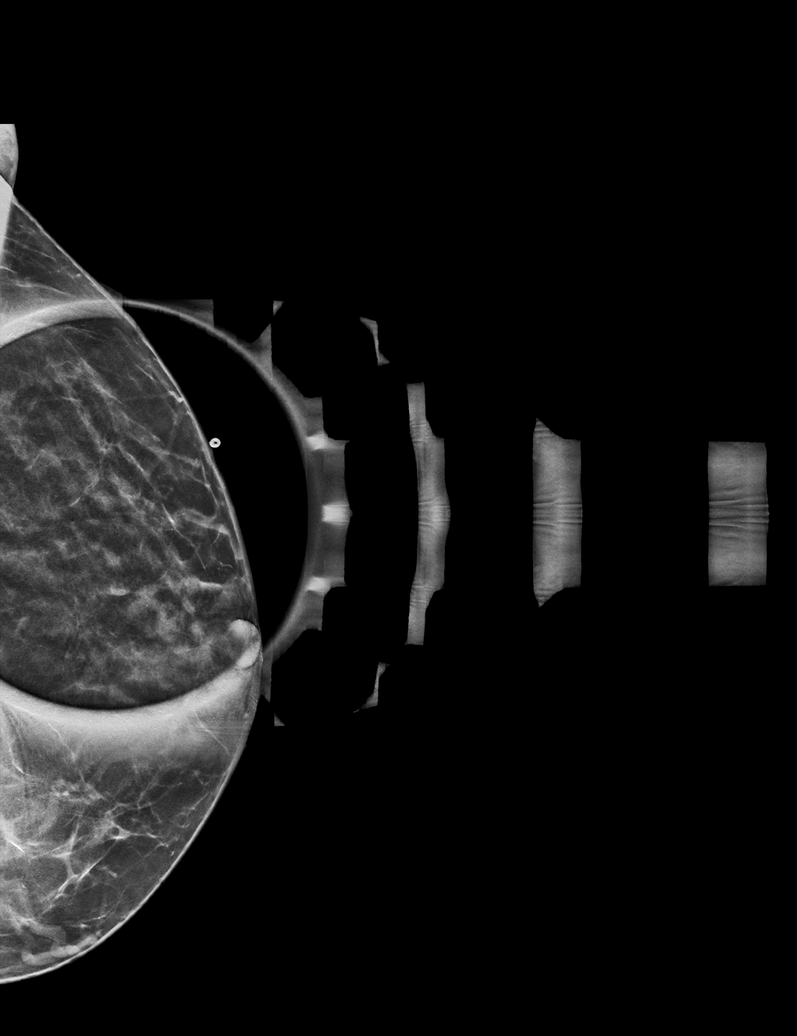

[L MLO synth-2D]
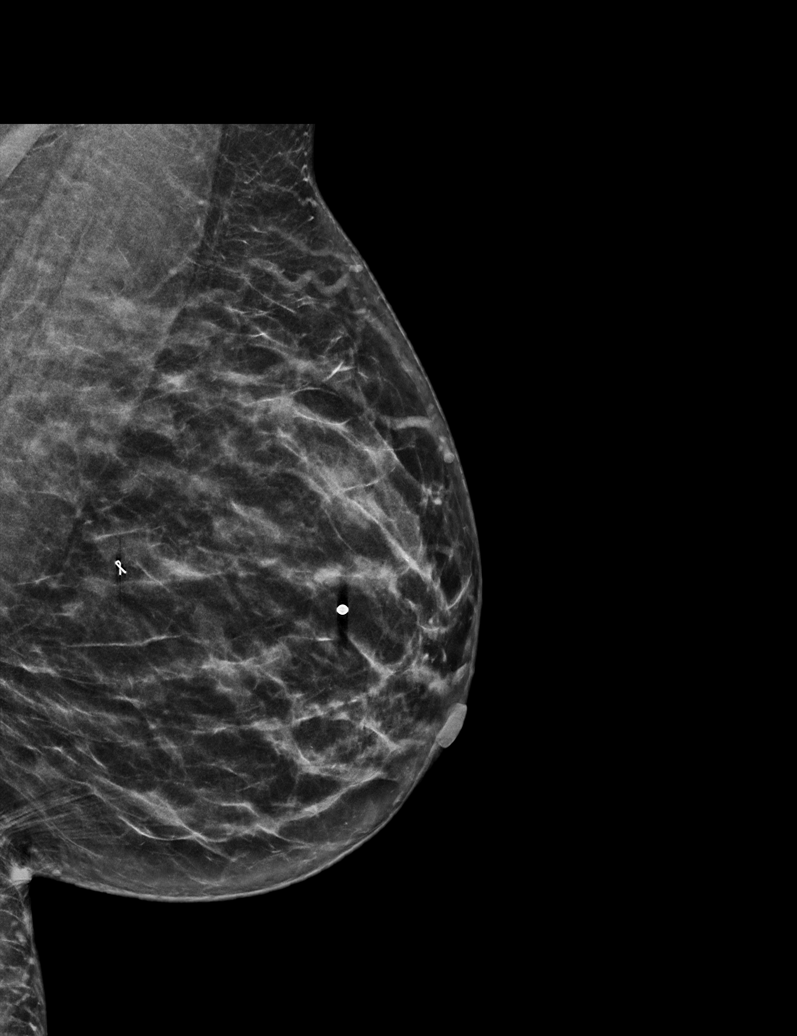

[L CC synth-2D]
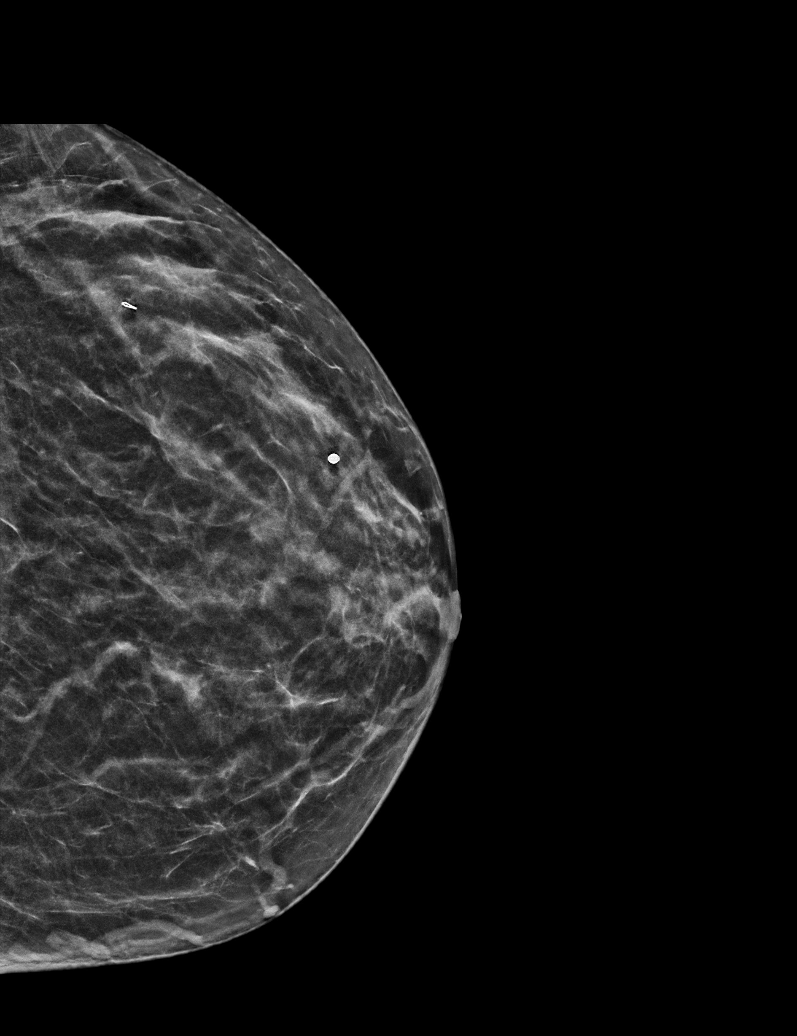

[R CC synth-2D]
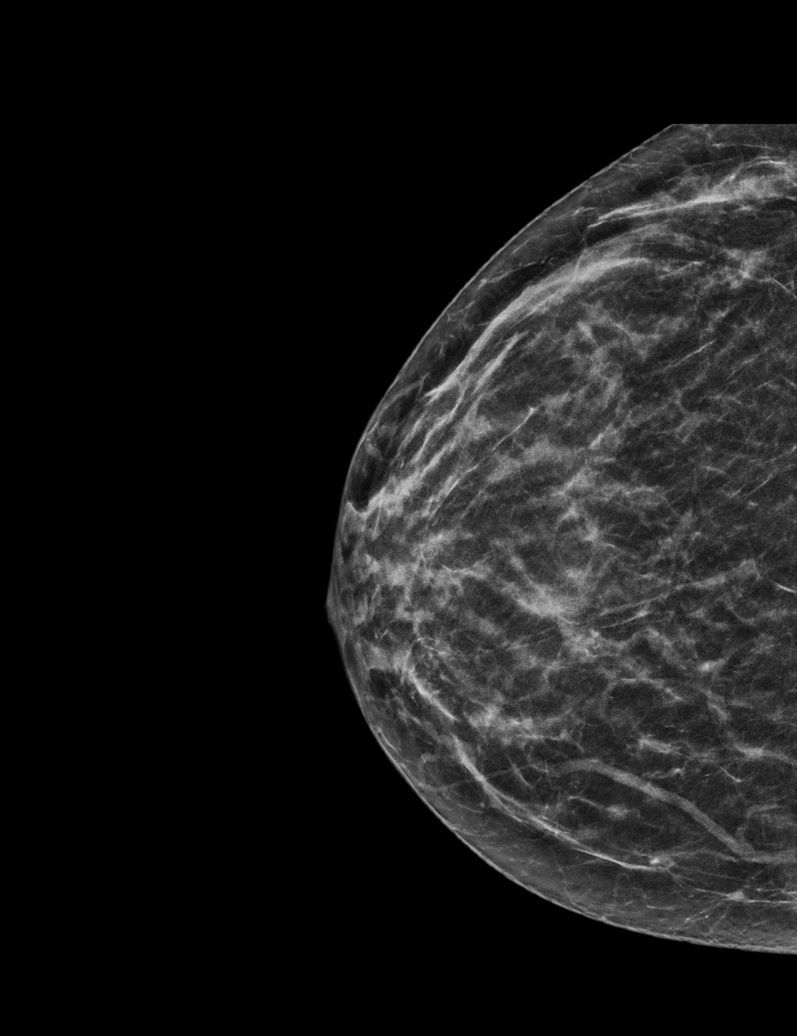

[L CC tomo · tomo slice 24/47.0]
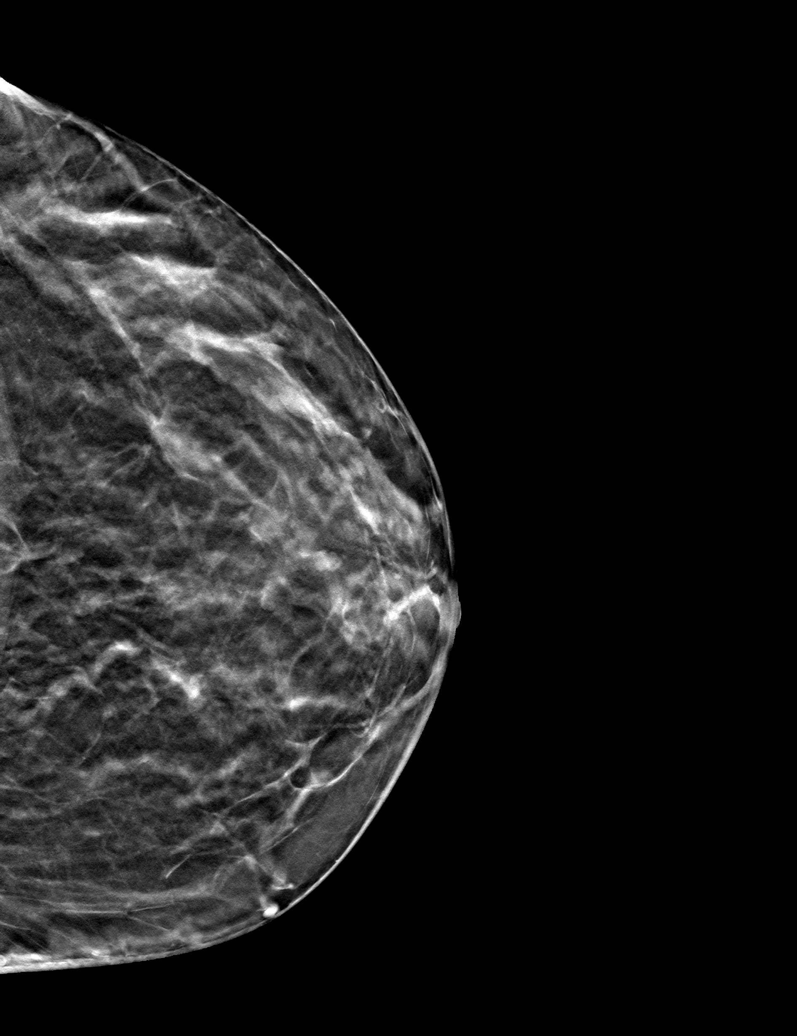

[6 of 30 positions shown; findings below may reference images not displayed]

ACR Breast Density Category c: The breast tissue is heterogeneously
dense, which may obscure small masses.
FINDINGS: A radiopaque BB was placed at the site of the patient's focal pain
in the upper outer left breast. No focal or suspicious mammographic
findings are seen deep to the radiopaque BB.

No suspicious mammographic findings identified in the remainder of
either breast. The parenchymal stable.

Mammographic images were processed with CAD.

Targeted ultrasound is performed, showing normal fibroglandular
tissue without focal or suspicious sonographic abnormality.
Evaluation of the outer left breast was performed.
IMPRESSION: 1. No mammographic evidence of malignancy in either breast.
2. No suspicious sonographic findings at the site of the patient's
focal left breast pain.

RECOMMENDATION:
1. Clinical follow-up recommended for the painful area of concern in
the left breast. Any further workup should be based on clinical
grounds.
2.  Screening mammogram in one year.(Code:[id number])

I have discussed the findings and recommendations with the patient.
If applicable, a reminder letter will be sent to the patient
regarding the next appointment.

BI-RADS CATEGORY  1: Negative.

## 2021-01-25 ENCOUNTER — Other Ambulatory Visit: Payer: Self-pay

## 2021-03-08 ENCOUNTER — Other Ambulatory Visit: Payer: Self-pay

## 2021-03-15 ENCOUNTER — Other Ambulatory Visit: Payer: Self-pay

## 2021-03-28 ENCOUNTER — Telehealth (INDEPENDENT_AMBULATORY_CARE_PROVIDER_SITE_OTHER): Payer: No Payment, Other | Admitting: Psychiatry

## 2021-03-28 ENCOUNTER — Other Ambulatory Visit: Payer: Self-pay

## 2021-03-28 ENCOUNTER — Encounter (HOSPITAL_COMMUNITY): Payer: Self-pay | Admitting: Psychiatry

## 2021-03-28 DIAGNOSIS — F330 Major depressive disorder, recurrent, mild: Secondary | ICD-10-CM

## 2021-03-28 MED ORDER — BUPROPION HCL ER (XL) 300 MG PO TB24
ORAL_TABLET | ORAL | 2 refills | Status: DC
  Filled 2021-03-28: qty 30, 30d supply, fill #0
  Filled 2021-05-23 – 2021-06-21 (×3): qty 30, 30d supply, fill #1

## 2021-03-28 MED ORDER — TRAZODONE HCL 50 MG PO TABS
ORAL_TABLET | Freq: Every evening | ORAL | 2 refills | Status: DC | PRN
  Filled 2021-03-28: qty 30, 30d supply, fill #0
  Filled 2021-06-21: qty 30, 30d supply, fill #1

## 2021-03-28 NOTE — Progress Notes (Signed)
BH MD/PA/NP OP Progress Note Virtual Visit via Telephone Note  I connected with Alicia Robinson on 03/28/21 at  1:00 PM EDT by telephone and verified that I am speaking with the correct person using two identifiers.  Location: Patient: home Provider: Clinic   I discussed the limitations, risks, security and privacy concerns of performing an evaluation and management service by telephone and the availability of in person appointments. I also discussed with the patient that there may be a patient responsible charge related to this service. The patient expressed understanding and agreed to proceed.   I provided 25 minutes of non-face-to-face time during this encounter.   03/28/2021 1:11 PM Alicia Robinson  MRN:  [id number]  Chief Complaint: "I've been stressed lately"  HPI: 45 year old female seen today for follow up of psychiatric evaluation. She has a psychiatric history of depression, anxiety, SI. She is currently managed on Wellbutrin 300mg  XL and Trazodone 50mg  nightly.   Patient was unable to logon virtually so her assessment was done over the phone.  Today on the phone she is pleasant, cooperative, engaged in conversation. Patient feels that her current medication regimen is generally working well. She reports recent increase in stress related to finances and work, still working for [employer]. She endorses normal appetite with estimated 15 pound weight gain since our last visit. She also reports that she is sleeping about 4 hours per night (due to work schedule). Patient states that over the past few weeks, she has experienced suprapubic cramping, rated 7/10. She has not seen her PCP regarding this.   To above stressors she notes that her anxiety and depression has somewhat worsened however notes that she is able to cope with it.  Today provider conducted GAD-7 today scoring 11, last visit scored 0. Provider also conducted PHQ-9 today scoring 10, last visit scored 1. Today she denies  visual or auditory hallucinations, paranoia, SI/HI, mania.   No medication changes mad today. Patient agreeable to continue medication as prescribed. No other concerns noted at this time.   Visit Diagnosis:    ICD-10-CM   1. Mild episode of recurrent major depressive disorder (HCC)  F33.0 buPROPion (WELLBUTRIN XL) 300 MG 24 hr tablet    traZODone (DESYREL) 50 MG tablet      Past Psychiatric History: depression, anxiety, and suicidal ideation.  Past Medical History:  Past Medical History:  Diagnosis Date   Depression    Hyperlipidemia    Hypertension    Suicidal overdose ([place])     Past Surgical History:  Procedure Laterality Date   CHOLECYSTECTOMY     TUBAL LIGATION      Family Psychiatric History: Mother depression  Family History:  Family History  Problem Relation Age of Onset   Hypertension Mother    Diabetes Mother    Hypertension Father    Diabetes Father    Diabetes Maternal Grandmother    Hypertension Maternal Grandmother     Social History:  Social History   Socioeconomic History   Marital status: Single    Spouse name: Not on file   Number of children: 3   Years of education: Not on file   Highest education level: Associate degree: occupational, Hotel manager, or vocational program  Occupational History   Not on file  Tobacco Use   Smoking status: Never   Smokeless tobacco: Never  Vaping Use   Vaping Use: Never used  Substance and Sexual Activity   Alcohol use: Yes    Comment: weekends,  socially   Drug use: Not Currently    Types: Marijuana   Sexual activity: Not Currently  Other Topics Concern   Not on file  Social History Narrative   Not on file   Social Determinants of Health   Financial Resource Strain: Not on file  Food Insecurity: Not on file  Transportation Needs: No Transportation Needs   Lack of Transportation (Medical): No   Lack of Transportation (Non-Medical): No  Physical Activity: Not on file  Stress: Not on file  Social  Connections: Not on file    Allergies:  Allergies  Allergen Reactions   Nsaids     Renal insufficiency    Metabolic Disorder Labs: Lab Results  Component Value Date   HGBA1C 5.7 (H) 03/10/2020   MPG 108.28 11/19/2018   MPG 105.41 09/22/2017   No results found for: PROLACTIN Lab Results  Component Value Date   CHOL 267 (H) 03/10/2020   TRIG 119 03/10/2020   HDL 56 03/10/2020   CHOLHDL 4.8 (H) 03/10/2020   VLDL 16 11/19/2018   LDLCALC 190 (H) 03/10/2020   LDLCALC 77 11/19/2018   Lab Results  Component Value Date   TSH 1.000 03/10/2020   TSH 1.578 11/19/2018    Therapeutic Level Labs: No results found for: LITHIUM No results found for: VALPROATE No components found for:  CBMZ  Current Medications: Current Outpatient Medications  Medication Sig Dispense Refill   aspirin EC 81 MG tablet Take 1 tablet (81 mg total) by mouth daily. For heart health     atorvastatin (LIPITOR) 20 MG tablet TAKE 1 TABLET (20 MG TOTAL) BY MOUTH DAILY. FOR HIGH CHOLESTEROL 120 tablet 0   buPROPion (WELLBUTRIN XL) 300 MG 24 hr tablet TAKE 1 TABLET (300 MG TOTAL) BY MOUTH DAILY. FOR MOOD 30 tablet 2   hydrochlorothiazide (HYDRODIURIL) 25 MG tablet TAKE 1 TABLET (25 MG TOTAL) BY MOUTH DAILY. FOR HIGH BLOOD PRESSURE 30 tablet 0   Multiple Vitamins-Minerals (MULTIVITAMIN ADULT PO) Take 1 tablet by mouth.     omeprazole (PRILOSEC) 20 MG capsule Take 1 capsule (20 mg total) by mouth 2 (two) times daily before a meal. 180 capsule 0   traZODone (DESYREL) 50 MG tablet TAKE 1 TABLET (50 MG TOTAL) BY MOUTH AT BEDTIME AS NEEDED FOR SLEEP. 30 tablet 2   No current facility-administered medications for this visit.     Musculoskeletal: Strength & Muscle Tone:  Unable to assess due to telephone visit Gait & Station:  Unable to assess due to telephone visit Patient leans: N/A  Psychiatric Specialty Exam: Review of Systems  There were no vitals taken for this visit.There is no height or weight on file  to calculate BMI.  General Appearance:  Unable to assess due to telephone visit  Eye Contact:   Unable to assess due to telephone visit  Speech:  Clear and Coherent and Normal Rate  Volume:  Normal  Mood:  Euthymic  Affect:  Appropriate and Congruent  Thought Process:  Coherent, Goal Directed and Linear  Orientation:  Full (Time, Place, and Person)  Thought Content: WDL and Logical   Suicidal Thoughts:  No  Homicidal Thoughts:  No  Memory:  Immediate;   Good Recent;   Good Remote;   Good  Judgement:  Good  Insight:  Good  Psychomotor Activity:  Normal  Concentration:  Concentration: Good and Attention Span: Good  Recall:  Good  Fund of Knowledge: Good  Language: Good  Akathisia:  No  Handed:  Right  AIMS (if indicated): Not done  Assets:  Communication Skills Desire for Improvement Financial Resources/Insurance Housing Social Support  ADL's:  Intact  Cognition: WNL  Sleep:  Good   Screenings: AIMS    Flowsheet Row Admission (Discharged) from 11/19/2018 in [place] [facility] Admission (Discharged) from 09/21/2017 in [place] [facility] Admission (Discharged) from OP Visit from 07/11/2017 in [place] [facility]  AIMS Total Score 0 0 0      AUDIT    Flowsheet Row Admission (Discharged) from 11/19/2018 in [place] [facility] Admission (Discharged) from OP Visit from 07/11/2017 in [place] [facility]  Alcohol Use Disorder Identification Test Final Score (AUDIT) 0 1      GAD-7    Flowsheet Row Video Visit from 03/28/2021 in [facility] Video Visit from 12/27/2020 in [facility] Office Visit from 07/12/2020 in [place] Office Visit from 11/25/2019 in [place] Office Visit from 04/01/2019 in [place]   Total GAD-7 Score 11 0 6 10 4       PHQ2-9    Flowsheet Row Video Visit from 03/28/2021 in [facility] Video Visit from 12/27/2020 in [facility] Office Visit from 07/12/2020 in [place] Office Visit from 11/25/2019 in [place] Office Visit from 04/01/2019 in [place]  PHQ-2 Total Score 3 0 5 2 2   PHQ-9 Total Score 10 1 12 8 4       Flowsheet Row Video Visit from 12/27/2020 in [facility] Admission (Discharged) from 11/19/2018 in [place] [facility] ED from 11/18/2018 in [place] DEPT  C-SSRS RISK CATEGORY No Risk Low Risk High Risk        Assessment and Plan: Patient notes that she is doing well on her current medication regimen.  No medication changes made.  Patient agreeable to continue medications as prescribed.  1. Mild episode of recurrent major depressive disorder (HCC)  Continue- buPROPion (WELLBUTRIN XL) 300 MG 24 hr tablet; Take 1 tablet (300 mg total) by mouth daily. For mood  Dispense: 30 tablet; Refill: 2 Continue- traZODone (DESYREL) 50 MG tablet; Take 1 tablet (50 mg total) by mouth at bedtime as needed for sleep.  Dispense: 30 tablet; Refill: 2  Follow up in 3 months   Salley Slaughter, NP 03/28/2021, 1:11 PM

## 2021-04-03 ENCOUNTER — Other Ambulatory Visit: Payer: Self-pay

## 2021-05-23 ENCOUNTER — Other Ambulatory Visit: Payer: Self-pay

## 2021-05-30 ENCOUNTER — Other Ambulatory Visit: Payer: Self-pay

## 2021-05-31 ENCOUNTER — Other Ambulatory Visit: Payer: Self-pay

## 2021-06-07 ENCOUNTER — Other Ambulatory Visit: Payer: Self-pay

## 2021-06-21 ENCOUNTER — Other Ambulatory Visit: Payer: Self-pay

## 2021-06-25 ENCOUNTER — Ambulatory Visit: Payer: BC Managed Care – PPO | Attending: Family Medicine | Admitting: Family Medicine

## 2021-06-25 ENCOUNTER — Other Ambulatory Visit: Payer: Self-pay

## 2021-06-25 ENCOUNTER — Encounter: Payer: Self-pay | Admitting: Family Medicine

## 2021-06-25 VITALS — BP 123/78 | HR 70 | Ht 70.0 in | Wt 196.0 lb

## 2021-06-25 DIAGNOSIS — M25561 Pain in right knee: Secondary | ICD-10-CM | POA: Diagnosis not present

## 2021-06-25 DIAGNOSIS — E785 Hyperlipidemia, unspecified: Secondary | ICD-10-CM

## 2021-06-25 DIAGNOSIS — Z1159 Encounter for screening for other viral diseases: Secondary | ICD-10-CM

## 2021-06-25 DIAGNOSIS — I10 Essential (primary) hypertension: Secondary | ICD-10-CM

## 2021-06-25 DIAGNOSIS — G8929 Other chronic pain: Secondary | ICD-10-CM | POA: Diagnosis not present

## 2021-06-25 DIAGNOSIS — Z1211 Encounter for screening for malignant neoplasm of colon: Secondary | ICD-10-CM

## 2021-06-25 MED ORDER — ATORVASTATIN CALCIUM 20 MG PO TABS
ORAL_TABLET | ORAL | 1 refills | Status: DC
  Filled 2021-06-25: qty 30, 30d supply, fill #0
  Filled 2021-07-23: qty 30, 30d supply, fill #1
  Filled 2021-08-27: qty 30, 30d supply, fill #2
  Filled 2021-10-05: qty 30, 30d supply, fill #0
  Filled 2021-10-29: qty 30, 30d supply, fill #1
  Filled 2021-12-04: qty 30, 30d supply, fill #2

## 2021-06-25 MED ORDER — HYDROCHLOROTHIAZIDE 25 MG PO TABS
ORAL_TABLET | ORAL | 1 refills | Status: DC
  Filled 2021-06-25: qty 30, 30d supply, fill #0
  Filled 2021-07-23: qty 30, 30d supply, fill #1
  Filled 2021-08-27: qty 30, 30d supply, fill #2
  Filled 2021-10-05: qty 30, 30d supply, fill #0
  Filled 2021-10-29: qty 30, 30d supply, fill #1
  Filled 2021-12-04: qty 30, 30d supply, fill #2

## 2021-06-25 MED ORDER — PREDNISONE 20 MG PO TABS
20.0000 mg | ORAL_TABLET | Freq: Every day | ORAL | 0 refills | Status: DC
  Filled 2021-06-25: qty 5, 5d supply, fill #0

## 2021-06-25 NOTE — Progress Notes (Signed)
Subjective:  Patient ID: Alicia Robinson, female    DOB: [birth year]  Age: 45 y.o. MRN: [id number]  CC: Establish Care and Hypertension   HPI Alicia Robinson is a 45 y.o. year old female with a history of hypertension, hyperlipidemia, major depressive disorder, previous history of CVA who presents today to establish care.  Interval History: She has not been taking any of her medications since she ran out 1 month ago.  Her blood pressure is normal despite this.  She informs me she previously had a stroke and was placed on atorvastatin for this. Her R knee hurts and appears swollen. It is worse with going up stairs and is absent at rest. It is also worse when she works out Past Medical History:  Diagnosis Date   Depression    Hyperlipidemia    Hypertension    Suicidal overdose ([place])     Past Surgical History:  Procedure Laterality Date   CHOLECYSTECTOMY     TUBAL LIGATION      Family History  Problem Relation Age of Onset   Hypertension Mother    Diabetes Mother    Hypertension Father    Diabetes Father    Diabetes Maternal Grandmother    Hypertension Maternal Grandmother     Allergies  Allergen Reactions   Nsaids     Renal insufficiency    Outpatient Medications Prior to Visit  Medication Sig Dispense Refill   buPROPion (WELLBUTRIN XL) 300 MG 24 hr tablet TAKE 1 TABLET (300 MG TOTAL) BY MOUTH DAILY. FOR MOOD 30 tablet 2   Multiple Vitamins-Minerals (MULTIVITAMIN ADULT PO) Take 1 tablet by mouth.     omeprazole (PRILOSEC) 20 MG capsule Take 1 capsule (20 mg total) by mouth 2 (two) times daily before a meal. 180 capsule 0   traZODone (DESYREL) 50 MG tablet TAKE 1 TABLET (50 MG TOTAL) BY MOUTH AT BEDTIME AS NEEDED FOR SLEEP. 30 tablet 2   atorvastatin (LIPITOR) 20 MG tablet TAKE 1 TABLET (20 MG TOTAL) BY MOUTH DAILY. FOR HIGH CHOLESTEROL 120 tablet 0   hydrochlorothiazide (HYDRODIURIL) 25 MG tablet TAKE 1 TABLET (25 MG TOTAL) BY MOUTH DAILY. FOR HIGH BLOOD PRESSURE 30  tablet 0   aspirin EC 81 MG tablet Take 1 tablet (81 mg total) by mouth daily. For heart health (Patient not taking: Reported on 06/25/2021)     No facility-administered medications prior to visit.     ROS Review of Systems  Constitutional:  Negative for activity change, appetite change and fatigue.  HENT:  Negative for congestion, sinus pressure and sore throat.   Eyes:  Negative for visual disturbance.  Respiratory:  Negative for cough, chest tightness, shortness of breath and wheezing.   Cardiovascular:  Negative for chest pain and palpitations.  Gastrointestinal:  Negative for abdominal distention, abdominal pain and constipation.  Endocrine: Negative for polydipsia.  Genitourinary:  Negative for dysuria and frequency.  Musculoskeletal:        See HPI  Skin:  Negative for rash.  Neurological:  Negative for tremors, light-headedness and numbness.  Hematological:  Does not bruise/bleed easily.  Psychiatric/Behavioral:  Negative for agitation and behavioral problems.    Objective:  BP 123/78   Pulse 70   Ht _0  (1.778 m)   Wt 196 lb (88.9 kg)   SpO2 100%   BMI 28.12 kg/m   BP/Weight 06/25/2021 07/12/2020 0/12/5995  Systolic BP 741 423 953  Diastolic BP 78 88 80  Wt. (Lbs) 196 190 190.5  BMI  28.12 27.26 27.33  Some encounter information is confidential and restricted. Go to Review Flowsheets activity to see all data.      Physical Exam Constitutional:      Appearance: She is well-developed.  Cardiovascular:     Rate and Rhythm: Normal rate.     Heart sounds: Normal heart sounds. No murmur heard. Pulmonary:     Effort: Pulmonary effort is normal.     Breath sounds: Normal breath sounds. No wheezing or rales.  Chest:     Chest wall: No tenderness.  Abdominal:     General: Bowel sounds are normal. There is no distension.     Palpations: Abdomen is soft. There is no mass.     Tenderness: There is no abdominal tenderness.  Musculoskeletal:        General:  Normal range of motion.     Right lower leg: No edema.     Left lower leg: No edema.  Neurological:     Mental Status: She is alert and oriented to person, place, and time.  Psychiatric:        Mood and Affect: Mood normal.    CMP Latest Ref Rng & Units 06/07/2020 06/06/2020 03/10/2020  Glucose 70 - 99 mg/dL 100(H) 153(H) 86  BUN 6 - 20 mg/dL _0 Creatinine 0.44 - 1.00 mg/dL 1.10(H) 1.13(H) 1.06(H)  Sodium 135 - 145 mmol/L 138 138 136  Potassium 3.5 - 5.1 mmol/L 3.5 3.4(L) 4.3  Chloride 98 - 111 mmol/L 99 102 98  CO2 22 - 32 mmol/L _1 Calcium 8.9 - 10.3 mg/dL 9.8 9.4 9.6  Total Protein 6.0 - 8.5 g/dL - - 6.7  Total Bilirubin 0.0 - 1.2 mg/dL - - 0.2  Alkaline Phos 48 - 121 IU/L - - 70  AST 0 - 40 IU/L - - 21  ALT 0 - 32 IU/L - - 25    Lipid Panel     Component Value Date/Time   CHOL 267 (H) 03/10/2020 1013   TRIG 119 03/10/2020 1013   HDL 56 03/10/2020 1013   CHOLHDL 4.8 (H) 03/10/2020 1013   CHOLHDL 3.3 11/19/2018 0633   VLDL 16 11/19/2018 0633   LDLCALC 190 (H) 03/10/2020 1013    CBC    Component Value Date/Time   WBC 13.6 (H) 06/07/2020 1541   RBC 4.85 06/07/2020 1541   HGB 14.0 06/07/2020 1541   HGB 12.8 03/10/2020 1013   HCT 42.2 06/07/2020 1541   HCT 39.6 03/10/2020 1013   PLT 611 (H) 06/07/2020 1541   MCV 87.0 06/07/2020 1541   MCV 88 03/10/2020 1013   MCH 28.9 06/07/2020 1541   MCHC 33.2 06/07/2020 1541   RDW 16.9 (H) 06/07/2020 1541   RDW 15.9 (H) 03/10/2020 1013   LYMPHSABS 3.5 (H) 03/10/2020 1013   MONOABS 1.1 (H) 11/18/2018 1600   EOSABS 0.3 03/10/2020 1013   BASOSABS 0.1 03/10/2020 1013    Lab Results  Component Value Date   HGBA1C 5.7 (H) 03/10/2020    Assessment & Plan:  1. Hyperlipidemia, unspecified hyperlipidemia type Uncontrolled We will make no regimen change if lipids are elevated due to the fact that she has been off her medication for 1 month Low-cholesterol diet - atorvastatin (LIPITOR) 20 MG tablet; TAKE 1 TABLET  (20 MG TOTAL) BY MOUTH DAILY. FOR HIGH CHOLESTEROL  Dispense: 90 tablet; Refill: 1 - CMP14+EGFR; Future - Lipid panel; Future  2. Essential hypertension Controlled Given she has been without hydrochlorothiazide for  more 1 month and BP has been controlled, we can place her on a trial of diet control and lifestyle modifications.  After shared decision making she decides to continue with hydrochlorothiazide. - hydrochlorothiazide (HYDRODIURIL) 25 MG tablet; TAKE 1 TABLET (25 MG TOTAL) BY MOUTH DAILY. FOR HIGH BLOOD PRESSURE  Dispense: 90 tablet; Refill: 1  3. Chronic pain of right knee Advised to use knee brace Placed on short course of prednisone Advised to continue with OTC NSAIDs as needed - predniSONE (DELTASONE) 20 MG tablet; Take 1 tablet (20 mg total) by mouth daily with breakfast.  Dispense: 5 tablet; Refill: 0  4. Screening for viral disease - HCV RNA quant rflx ultra or genotyp([facility]); Future  5. Screening for colon cancer - Ambulatory referral to Gastroenterology   Meds ordered this encounter  Medications   atorvastatin (LIPITOR) 20 MG tablet    Sig: TAKE 1 TABLET (20 MG TOTAL) BY MOUTH DAILY. FOR HIGH CHOLESTEROL    Dispense:  90 tablet    Refill:  1   hydrochlorothiazide (HYDRODIURIL) 25 MG tablet    Sig: TAKE 1 TABLET (25 MG TOTAL) BY MOUTH DAILY. FOR HIGH BLOOD PRESSURE    Dispense:  90 tablet    Refill:  1   predniSONE (DELTASONE) 20 MG tablet    Sig: Take 1 tablet (20 mg total) by mouth daily with breakfast.    Dispense:  5 tablet    Refill:  0    Follow-up: Return in about 3 months (around 09/25/2021) for Hyperlipidemia.       Charlott Rakes, MD, FAAFP. [facility] [place], [place]   06/25/2021, 9:53 AM

## 2021-06-25 NOTE — Patient Instructions (Signed)
Chronic Knee Pain, Adult ?Knee pain that lasts longer than 3 months is called chronic knee pain. You may have pain in one or both knees. Symptoms of chronic knee pain may also include swelling and stiffness. The most common cause is age-related wear and tear (osteoarthritis) of your knee joint. Many conditions can cause chronic knee pain. ?Treatment depends on the cause. The main treatments are physical therapy and weight loss. It may also be treated with medicines, injections, a knee sleeve or brace, and by using crutches. Rest, ice, pressure (compression), and elevation, also known as RICE therapy, may also be recommended. ?Follow these instructions at home: ?If you have a knee sleeve or brace: ? ?Wear the knee sleeve or brace as told by your doctor. Take it off only as told by your doctor. ?Loosen it if your toes: ?Tingle. ?Become numb. ?Turn cold and blue. ?Keep it clean. ?If the sleeve or brace is not waterproof: ?Do not let it get wet. ?Ask your doctor if you may take it off when you take a bath or shower. If not, cover it with a watertight covering. ?Managing pain, stiffness, and swelling ?  ?If told, put heat on your knee. Do this as often as told by your doctor. Use the heat source that your doctor recommends, such as a moist heat pack or a heating pad. ?If you have a removable knee sleeve or brace, take it off as told by your doctor. ?Place a towel between your skin and the heat source. ?Leave the heat on for 20-30 minutes. ?Take off the heat if your skin turns bright red. This is very important. If you cannot feel pain, heat, or cold, you have a greater risk of getting burned. ?If told, put ice on your knee. To do this: ?If you have a removable knee sleeve or brace, take it off as told by your doctor. ?Put ice in a plastic bag. ?Place a towel between your skin and the bag. ?Leave the ice on for 20 minutes, 2-3 times a day. ?Take off the ice if your skin turns bright red. This is very important. If you  cannot feel pain, heat, or cold, you have a greater risk of damage to the area. ?Move your toes often. ?Raise the injured area above the level of your heart while you are sitting or lying down. ?Activity ?Avoid activities where both feet leave the ground at the same time (high-impact activities). Examples are running, jumping rope, and doing jumping jacks. ?Follow the exercise plan that your doctor makes for you. Your doctor may suggest that you: ?Avoid activities that make knee pain worse. You may need to change the exercises that you do, the sports that you participate in, or your job duties. ?Wear shoes with cushioned soles. ?Avoid sports that require running and sudden changes in direction. ?Do exercises or physical therapy. This is planned to match your needs and your abilities. ?Do exercises that increase your balance and strength, such as tai chi and yoga. ?Do not use your injured knee to support your body weight until your doctor says that you can. Use crutches as told by your doctor. ?Return to your normal activities when your doctor says that it is safe. ?General instructions ?Take over-the-counter and prescription medicines only as told by your doctor. ?If you are overweight, work with your doctor and a food expert (dietitian) to set goals to lose weight. Being overweight can make your knee hurt more. ?Do not smoke or use any products that   contain nicotine or tobacco. If you need help quitting, ask your doctor. ?Keep all follow-up visits. ?Contact a doctor if: ?You have knee pain that is not getting better or gets worse. ?You are not able to do your exercises due to knee pain. ?Get help right away if: ?Your knee swells and the swelling gets worse. ?You cannot move your knee. ?You have very bad knee pain. ?Summary ?Knee pain that lasts more than 3 months is called chronic knee pain. ?The main treatments for chronic knee pain are physical therapy and weight loss. You may also need to take medicines, wear a  knee sleeve or brace, use crutches, and put ice or heat on your knee. ?Lose weight if you are overweight. Work with your doctor and a food expert (dietitian) to help you set goals to lose weight. Being overweight can make your knee hurt more. ?Follow the exercise plan that your doctor makes for you. ?This information is not intended to replace advice given to you by your health care provider. Make sure you discuss any questions you have with your health care provider. ?Document Revised: 02/16/2020 Document Reviewed: 02/16/2020 ?Elsevier Patient Education ? 2022 [employer] ? ?

## 2021-06-25 NOTE — Progress Notes (Signed)
Needs refills on all medications.  Pain in right Knee.

## 2021-06-28 ENCOUNTER — Encounter (HOSPITAL_COMMUNITY): Payer: Self-pay | Admitting: Psychiatry

## 2021-06-28 ENCOUNTER — Other Ambulatory Visit (HOSPITAL_COMMUNITY): Payer: Self-pay | Admitting: Psychiatry

## 2021-06-28 ENCOUNTER — Telehealth (INDEPENDENT_AMBULATORY_CARE_PROVIDER_SITE_OTHER): Payer: BC Managed Care – PPO | Admitting: Psychiatry

## 2021-06-28 ENCOUNTER — Other Ambulatory Visit: Payer: Self-pay

## 2021-06-28 DIAGNOSIS — F330 Major depressive disorder, recurrent, mild: Secondary | ICD-10-CM

## 2021-06-28 DIAGNOSIS — F411 Generalized anxiety disorder: Secondary | ICD-10-CM | POA: Diagnosis not present

## 2021-06-28 DIAGNOSIS — F4310 Post-traumatic stress disorder, unspecified: Secondary | ICD-10-CM

## 2021-06-28 MED ORDER — BUPROPION HCL ER (XL) 450 MG PO TB24
450.0000 mg | ORAL_TABLET | Freq: Every day | ORAL | 3 refills | Status: DC
  Filled 2021-06-28: qty 30, 30d supply, fill #0

## 2021-06-28 MED ORDER — BUSPIRONE HCL 10 MG PO TABS
10.0000 mg | ORAL_TABLET | Freq: Three times a day (TID) | ORAL | 3 refills | Status: DC
  Filled 2021-06-28: qty 90, 30d supply, fill #0
  Filled 2021-08-27: qty 90, 30d supply, fill #1

## 2021-06-28 MED ORDER — TRAZODONE HCL 50 MG PO TABS
50.0000 mg | ORAL_TABLET | Freq: Every evening | ORAL | 3 refills | Status: DC | PRN
Start: 2021-06-28 — End: 2021-09-28
  Filled 2021-06-28 – 2021-07-23 (×2): qty 60, 30d supply, fill #0
  Filled 2021-08-27: qty 60, 30d supply, fill #1

## 2021-06-28 MED ORDER — BUPROPION HCL ER (XL) 150 MG PO TB24
150.0000 mg | ORAL_TABLET | ORAL | 3 refills | Status: DC
  Filled 2021-06-28: qty 30, 30d supply, fill #0
  Filled 2021-07-23 – 2021-08-07 (×2): qty 30, 30d supply, fill #1
  Filled 2021-08-27 – 2021-09-18 (×3): qty 30, 30d supply, fill #2

## 2021-06-28 MED ORDER — BUPROPION HCL ER (XL) 300 MG PO TB24
300.0000 mg | ORAL_TABLET | ORAL | 3 refills | Status: DC
  Filled 2021-06-28 – 2021-07-23 (×2): qty 30, 30d supply, fill #0
  Filled 2021-08-27: qty 30, 30d supply, fill #1

## 2021-06-28 NOTE — Progress Notes (Signed)
BH MD/PA/NP OP Progress Note Virtual Visit via Video Note  I connected with Alicia Robinson on 06/28/21 at  1:30 PM EDT by a video enabled telemedicine application and verified that I am speaking with the correct person using two identifiers.  Location: Patient: Home Provider: Clinic   I discussed the limitations of evaluation and management by telemedicine and the availability of in person appointments. The patient expressed understanding and agreed to proceed.  I provided 30 minutes of non-face-to-face time during this encounter.      06/28/2021 2:01 PM Alicia Robinson  MRN:  [id number]  Chief Complaint: "I've been going through"  HPI: 45 year old female seen today for follow up of psychiatric evaluation. She has a psychiatric history of depression, anxiety, SI. She is currently managed on Wellbutrin 300mg  XL and Trazodone 50mg  nightly. She notes her medications are somewhat effective in managing her psychiatric conditions.  Today she was was well groomed, pleasant, cooperative, engaged in conversation and maintained eye contact.  She informed Probation officer that she has been going through. She notes that work is stressful, she misses her mother who lives in a different state, and she wants to return home to [place]  She informed Probation officer that she has been feeling more depressed and notes that she has been crying more. She notes that her sleep ans appetite is poor. Today provider conducted GAD-7 today scoring 16, last visit scored 11. Provider also conducted PHQ-9 today scoring 15, last visit scored 10. Today she denies visual or auditory hallucinations, paranoia, SI/HI, mania.   Patient informed Probation officer that recently she has been having flashbacks, nightmares, and avoidant behaviors related to past trauma.   Today she is agreeable to increasing Wellbutrin XL 300 mg to 450 mg to help manage depression. She is also agreeable to starting Buspar 10 mg three times daily to help manage  anxiety and depression. She will also increase Trazodone to 50-100 mg to help manage sleep. Patient referred to outpatient counseling for therapy.  No other concerns noted at this time.   Visit Diagnosis:    ICD-10-CM   1. Generalized anxiety disorder  F41.1 traZODone (DESYREL) 50 MG tablet    busPIRone (BUSPAR) 10 MG tablet    Ambulatory referral to Social Work    2. Mild episode of recurrent major depressive disorder (HCC)  F33.0 buPROPion HCl ER, XL, 450 MG TB24    traZODone (DESYREL) 50 MG tablet    busPIRone (BUSPAR) 10 MG tablet    Ambulatory referral to Social Work    3. PTSD (post-traumatic stress disorder)  F43.10 Ambulatory referral to Social Work      Past Psychiatric History: depression, anxiety, and suicidal ideation.  Past Medical History:  Past Medical History:  Diagnosis Date   Depression    Hyperlipidemia    Hypertension    Suicidal overdose ([place])     Past Surgical History:  Procedure Laterality Date   CHOLECYSTECTOMY     TUBAL LIGATION      Family Psychiatric History: Mother depression  Family History:  Family History  Problem Relation Age of Onset   Hypertension Mother    Diabetes Mother    Hypertension Father    Diabetes Father    Diabetes Maternal Grandmother    Hypertension Maternal Grandmother     Social History:  Social History   Socioeconomic History   Marital status: Single    Spouse name: Not on file   Number of children: 3   Years of  education: Not on file   Highest education level: Associate degree: occupational, Hotel manager, or vocational program  Occupational History   Not on file  Tobacco Use   Smoking status: Never   Smokeless tobacco: Never  Vaping Use   Vaping Use: Never used  Substance and Sexual Activity   Alcohol use: Yes    Comment: weekends, socially   Drug use: Not Currently    Types: Marijuana   Sexual activity: Not Currently  Other Topics Concern   Not on file  Social History Narrative   Not on file    Social Determinants of Health   Financial Resource Strain: Not on file  Food Insecurity: Not on file  Transportation Needs: Not on file  Physical Activity: Not on file  Stress: Not on file  Social Connections: Not on file    Allergies:  Allergies  Allergen Reactions   Nsaids     Renal insufficiency    Metabolic Disorder Labs: Lab Results  Component Value Date   HGBA1C 5.7 (H) 03/10/2020   MPG 108.28 11/19/2018   MPG 105.41 09/22/2017   No results found for: PROLACTIN Lab Results  Component Value Date   CHOL 267 (H) 03/10/2020   TRIG 119 03/10/2020   HDL 56 03/10/2020   CHOLHDL 4.8 (H) 03/10/2020   VLDL 16 11/19/2018   LDLCALC 190 (H) 03/10/2020   LDLCALC 77 11/19/2018   Lab Results  Component Value Date   TSH 1.000 03/10/2020   TSH 1.578 11/19/2018    Therapeutic Level Labs: No results found for: LITHIUM No results found for: VALPROATE No components found for:  CBMZ  Current Medications: Current Outpatient Medications  Medication Sig Dispense Refill   busPIRone (BUSPAR) 10 MG tablet Take 1 tablet (10 mg total) by mouth 3 (three) times daily. 90 tablet 3   aspirin EC 81 MG tablet Take 1 tablet (81 mg total) by mouth daily. For heart health (Patient not taking: Reported on 06/25/2021)     atorvastatin (LIPITOR) 20 MG tablet TAKE 1 TABLET (20 MG TOTAL) BY MOUTH DAILY. FOR HIGH CHOLESTEROL 90 tablet 1   buPROPion HCl ER, XL, 450 MG TB24 Take 1 tablet by mouth daily. 30 tablet 3   hydrochlorothiazide (HYDRODIURIL) 25 MG tablet TAKE 1 TABLET (25 MG TOTAL) BY MOUTH DAILY. FOR HIGH BLOOD PRESSURE 90 tablet 1   Multiple Vitamins-Minerals (MULTIVITAMIN ADULT PO) Take 1 tablet by mouth.     omeprazole (PRILOSEC) 20 MG capsule Take 1 capsule (20 mg total) by mouth 2 (two) times daily before a meal. 180 capsule 0   predniSONE (DELTASONE) 20 MG tablet Take 1 tablet (20 mg total) by mouth daily with breakfast. 5 tablet 0   traZODone (DESYREL) 50 MG tablet Take 1-2  tablets (50-100 mg total) by mouth at bedtime as needed for sleep. 60 tablet 3   No current facility-administered medications for this visit.     Musculoskeletal: Strength & Muscle Tone:  Unable to assess due to teleheath visit Gait & Station:  Unable to assess due to telehealth visit Patient leans: N/A  Psychiatric Specialty Exam: Review of Systems  There were no vitals taken for this visit.There is no height or weight on file to calculate BMI.  General Appearance: Well Groomed  Eye Contact:  Good  Speech:  Clear and Coherent and Normal Rate  Volume:  Normal  Mood:  Anxious and Depressed  Affect:  Appropriate and Congruent  Thought Process:  Coherent, Goal Directed and Linear  Orientation:  Full (  Time, Place, and Person)  Thought Content: WDL and Logical   Suicidal Thoughts:  No  Homicidal Thoughts:  No  Memory:  Immediate;   Good Recent;   Good Remote;   Good  Judgement:  Good  Insight:  Good  Psychomotor Activity:  Normal  Concentration:  Concentration: Good and Attention Span: Good  Recall:  Good  Fund of Knowledge: Good  Language: Good  Akathisia:  No  Handed:  Right  AIMS (if indicated): Not done  Assets:  Communication Skills Desire for Improvement Financial Resources/Insurance Housing Social Support  ADL's:  Intact  Cognition: WNL  Sleep:  Fair   Screenings: AIMS    Flowsheet Row Admission (Discharged) from 11/19/2018 in [place] [facility] Admission (Discharged) from 09/21/2017 in [place] [facility] Admission (Discharged) from OP Visit from 07/11/2017 in [place] [facility]  AIMS Total Score 0 0 0      AUDIT    Flowsheet Row Admission (Discharged) from 11/19/2018 in [place] [facility] Admission (Discharged) from OP Visit from 07/11/2017 in [place] [facility]  Alcohol Use Disorder Identification Test Final Score (AUDIT) 0 1       GAD-7    Flowsheet Row Video Visit from 06/28/2021 in [facility] Office Visit from 06/25/2021 in [place] Video Visit from 03/28/2021 in [facility] Video Visit from 12/27/2020 in [facility] Office Visit from 07/12/2020 in [place]  Total GAD-7 Score 16 14 11  0 6      PHQ2-9    Flowsheet Row Video Visit from 06/28/2021 in [facility] Office Visit from 06/25/2021 in [place] Video Visit from 03/28/2021 in [facility] Video Visit from 12/27/2020 in [facility] Office Visit from 07/12/2020 in [place]  PHQ-2 Total Score 4 4 3  0 5  PHQ-9 Total Score 15 15 10 1 12       Flowsheet Row Video Visit from 12/27/2020 in [facility] Admission (Discharged) from 11/19/2018 in [place] [facility] ED from 11/18/2018 in [place] DEPT  C-SSRS RISK CATEGORY No Risk Low Risk High Risk        Assessment and Plan: Patient endorses symptoms of anxiety, depression, insomnia, and PTSD. Today she is agreeable to increasing Wellbutrin XL 300 mg to 450 mg to help manage depression. She is also agreeable to starting Buspar 10 mg three times daily to help manage anxiety and depression. She will also increase Trazodone to 50-100 mg to help manage sleep. Patient referred to outpatient counseling for therapy  1. Mild episode of recurrent major depressive disorder (HCC)  Increased- busROPion HCl ER, XL, 450 MG TB24; Take 1 tablet by mouth daily.  Dispense: 30 tablet; Refill: 3 Increased- traZODone (DESYREL) 50 MG tablet; Take 1-2 tablets (50-100 mg total) by mouth at bedtime as needed for sleep.  Dispense: 60 tablet; Refill: 3 -Start  busPIRone (BUSPAR) 10 MG tablet; Take 1 tablet (10 mg total) by mouth 3 (three) times daily.  Dispense: 90 tablet; Refill: 3 - Ambulatory referral to Social Work  2. Generalized anxiety disorder  Increased- traZODone (DESYREL) 50 MG tablet; Take 1-2 tablets (50-100 mg total) by mouth at bedtime as needed for sleep.  Dispense: 60 tablet; Refill:  3 Start- busPIRone (BUSPAR) 10 MG tablet; Take 1 tablet (10 mg total) by mouth 3 (three) times daily.  Dispense: 90 tablet; Refill: 3 - Ambulatory referral to Social Work  3. PTSD (post-traumatic stress disorder)  - Ambulatory referral to Social Work      Follow up in 3 months   Salley Slaughter, NP 06/28/2021, 2:01 PM

## 2021-07-03 ENCOUNTER — Other Ambulatory Visit: Payer: Self-pay

## 2021-07-03 ENCOUNTER — Ambulatory Visit: Payer: BC Managed Care – PPO | Attending: Family Medicine

## 2021-07-03 DIAGNOSIS — E785 Hyperlipidemia, unspecified: Secondary | ICD-10-CM

## 2021-07-03 DIAGNOSIS — Z1159 Encounter for screening for other viral diseases: Secondary | ICD-10-CM

## 2021-07-04 LAB — LIPID PANEL
Chol/HDL Ratio: 3.7 ratio (ref 0.0–4.4)
Cholesterol, Total: 185 mg/dL (ref 100–199)
HDL: 50 mg/dL (ref >39)
LDL Chol Calc (NIH): 120 mg/dL — ABNORMAL HIGH (ref 0–99)
Triglycerides: 81 mg/dL (ref 0–149)
VLDL Cholesterol Cal: 15 mg/dL (ref 5–40)

## 2021-07-04 LAB — HCV RNA QUANT RFLX ULTRA OR GENOTYP: HCV Quant Baseline: NOT DETECTED IU/mL

## 2021-07-04 LAB — CMP14+EGFR
ALT: 25 IU/L (ref 0–32)
AST: 21 IU/L (ref 0–40)
Albumin/Globulin Ratio: 2 (ref 1.2–2.2)
Albumin: 4.8 g/dL (ref 3.8–4.8)
Alkaline Phosphatase: 78 IU/L (ref 44–121)
BUN/Creatinine Ratio: 9 (ref 9–23)
BUN: 11 mg/dL (ref 6–24)
Bilirubin Total: 0.4 mg/dL (ref 0.0–1.2)
CO2: 28 mmol/L (ref 20–29)
Calcium: 9.8 mg/dL (ref 8.7–10.2)
Chloride: 97 mmol/L (ref 96–106)
Creatinine, Ser: 1.26 mg/dL — ABNORMAL HIGH (ref 0.57–1.00)
Globulin, Total: 2.4 g/dL (ref 1.5–4.5)
Glucose: 97 mg/dL (ref 70–99)
Potassium: 4.3 mmol/L (ref 3.5–5.2)
Sodium: 139 mmol/L (ref 134–144)
Total Protein: 7.2 g/dL (ref 6.0–8.5)
eGFR: 53 mL/min/{1.73_m2} — ABNORMAL LOW (ref >59)

## 2021-07-05 ENCOUNTER — Encounter: Payer: Self-pay | Admitting: Family Medicine

## 2021-07-05 ENCOUNTER — Telehealth: Payer: Self-pay

## 2021-07-05 ENCOUNTER — Encounter: Payer: Self-pay | Admitting: Gastroenterology

## 2021-07-05 NOTE — Telephone Encounter (Signed)
-----   Message from Charlott Rakes, MD sent at 07/04/2021  9:02 PM EDT ----- Can you please ensure she has seen this message? Thanks

## 2021-07-05 NOTE — Telephone Encounter (Signed)
Patient name and DOB has been verified Patient was informed of lab results. Patient had no questions.  

## 2021-07-10 ENCOUNTER — Other Ambulatory Visit: Payer: Self-pay

## 2021-07-10 ENCOUNTER — Ambulatory Visit (AMBULATORY_SURGERY_CENTER): Payer: BC Managed Care – PPO | Admitting: *Deleted

## 2021-07-10 VITALS — Ht 70.0 in | Wt 185.0 lb

## 2021-07-10 DIAGNOSIS — Z1211 Encounter for screening for malignant neoplasm of colon: Secondary | ICD-10-CM

## 2021-07-10 MED ORDER — PEG 3350-KCL-NA BICARB-NACL 420 G PO SOLR
4000.0000 mL | Freq: Once | ORAL | 0 refills | Status: AC
  Filled 2021-07-10 – 2021-07-18 (×2): qty 4000, 1d supply, fill #0

## 2021-07-10 NOTE — Progress Notes (Signed)
Pt's previsit is done over the phone and all paperwork (prep instructions, blank consent form to just read over) sent to patient and MyChart.   Pt's name and DOB verified at the beginning of the previsit.  Pt denies any difficulty with ambulating.    No trouble with anesthesia, denies being told they were difficult to intubate, or hx/fam hx of malignant hyperthermia per pt  No egg or soy allergy  No home oxygen use   No medications for weight loss taken  emmi information given  Pt denies constipation issues  Pt informed that we do not do prior authorizations for prep   Pt chooses to drink Golytely for procedure

## 2021-07-17 ENCOUNTER — Encounter: Payer: Self-pay | Admitting: Gastroenterology

## 2021-07-17 ENCOUNTER — Other Ambulatory Visit: Payer: Self-pay

## 2021-07-18 ENCOUNTER — Other Ambulatory Visit: Payer: Self-pay

## 2021-07-23 ENCOUNTER — Other Ambulatory Visit: Payer: Self-pay

## 2021-07-24 ENCOUNTER — Other Ambulatory Visit: Payer: Self-pay

## 2021-07-25 ENCOUNTER — Encounter: Payer: No Typology Code available for payment source | Admitting: Gastroenterology

## 2021-08-07 ENCOUNTER — Other Ambulatory Visit: Payer: Self-pay

## 2021-08-14 ENCOUNTER — Other Ambulatory Visit: Payer: Self-pay

## 2021-08-14 ENCOUNTER — Telehealth: Payer: Self-pay | Admitting: Gastroenterology

## 2021-08-14 ENCOUNTER — Encounter: Payer: Self-pay | Admitting: Certified Registered Nurse Anesthetist

## 2021-08-14 NOTE — Telephone Encounter (Signed)
Was unable to reach pt. Left VM to have pt call [place] back.

## 2021-08-14 NOTE — Telephone Encounter (Signed)
Pt returned call. She wanted to know if she could donate plasma today with her procedure tomorrow. Advised pt that we do not recommend that she donates plasma as it will put her at risk for syncope. Pt verbalized understanding, and had no further concerns.

## 2021-08-14 NOTE — Telephone Encounter (Signed)
Patient has colonoscopy scheduled tomorrow.  She wants to know if she can give plasma today.  Please call and advise.

## 2021-08-15 ENCOUNTER — Other Ambulatory Visit: Payer: Self-pay

## 2021-08-15 ENCOUNTER — Encounter: Payer: Self-pay | Admitting: Gastroenterology

## 2021-08-15 ENCOUNTER — Ambulatory Visit (AMBULATORY_SURGERY_CENTER): Payer: BC Managed Care – PPO | Admitting: Gastroenterology

## 2021-08-15 VITALS — BP 127/82 | HR 73 | Temp 97.3°F | Resp 10 | Ht 70.0 in | Wt 185.0 lb

## 2021-08-15 DIAGNOSIS — Z1211 Encounter for screening for malignant neoplasm of colon: Secondary | ICD-10-CM | POA: Diagnosis not present

## 2021-08-15 MED ORDER — SODIUM CHLORIDE 0.9 % IV SOLN
500.0000 mL | Freq: Once | INTRAVENOUS | Status: DC
Start: 2021-08-15 — End: 2021-08-15

## 2021-08-15 NOTE — Progress Notes (Signed)
Report given to PACU, vss 

## 2021-08-15 NOTE — Op Note (Signed)
[facility] Patient Name: Alicia Robinson Procedure Date: 08/15/2021 4:25 PM MRN: [id number] Endoscopist: Mauri Pole , MD Age: 45 Referring MD:  Date of Birth: [birth year] Gender: Female Account #: [id number] Procedure:                Colonoscopy Indications:              Screening for colorectal malignant neoplasm Medicines:                Monitored Anesthesia Care Procedure:                Pre-Anesthesia Assessment:                           - Prior to the procedure, a History and Physical                            was performed, and patient medications and                            allergies were reviewed. The patient's tolerance of                            previous anesthesia was also reviewed. The risks                            and benefits of the procedure and the sedation                            options and risks were discussed with the patient.                            All questions were answered, and informed consent                            was obtained. Prior Anticoagulants: The patient has                            taken no previous anticoagulant or antiplatelet                            agents. ASA Grade Assessment: III - A patient with                            severe systemic disease. After reviewing the risks                            and benefits, the patient was deemed in                            satisfactory condition to undergo the procedure.                           After obtaining informed consent, the colonoscope  was passed under direct vision. Throughout the                            procedure, the patient's blood pressure, pulse, and                            oxygen saturations were monitored continuously. The                            Olympus PCF-H190DL (#[id number]) Colonoscope was                            introduced through the anus and advanced to the the                            cecum,  identified by appendiceal orifice and                            ileocecal valve. The colonoscopy was performed                            without difficulty. The patient tolerated the                            procedure well. The quality of the bowel                            preparation was good. The ileocecal valve,                            appendiceal orifice, and rectum were photographed. Scope In: 4:30:01 PM Scope Out: 4:45:08 PM Scope Withdrawal Time: 0 hours 6 minutes 21 seconds  Total Procedure Duration: 0 hours 15 minutes 7 seconds  Findings:                 The perianal and digital rectal examinations were                            normal.                           Non-bleeding external and internal hemorrhoids were                            found during retroflexion. The hemorrhoids were                            medium-sized.                           The exam was otherwise without abnormality. Complications:            No immediate complications. Estimated Blood Loss:     Estimated blood loss: none. Impression:               - Non-bleeding external and internal hemorrhoids.                           -  The examination was otherwise normal.                           - No specimens collected. Recommendation:           - Patient has a contact number available for                            emergencies. The signs and symptoms of potential                            delayed complications were discussed with the                            patient. Return to normal activities tomorrow.                            Written discharge instructions were provided to the                            patient.                           - Resume previous diet.                           - Continue present medications.                           - Repeat colonoscopy in 10 years for screening                            purposes. Mauri Pole, MD 08/15/2021 4:49:46 PM This report  has been signed electronically.

## 2021-08-15 NOTE — Patient Instructions (Signed)
YOU HAD AN ENDOSCOPIC PROCEDURE TODAY AT THE [facility]:   Refer to the procedure report that was given to you for any specific questions about what was found during the examination.  If the procedure report does not answer your questions, please call your gastroenterologist to clarify.  If you requested that your care partner not be given the details of your procedure findings, then the procedure report has been included in a sealed envelope for you to review at your convenience later.  YOU SHOULD EXPECT: Some feelings of bloating in the abdomen. Passage of more gas than usual.  Walking can help get rid of the air that was put into your GI tract during the procedure and reduce the bloating. If you had a lower endoscopy (such as a colonoscopy or flexible sigmoidoscopy) you may notice spotting of blood in your stool or on the toilet paper. If you underwent a bowel prep for your procedure, you may not have a normal bowel movement for a few days.  Please Note:  You might notice some irritation and congestion in your nose or some drainage.  This is from the oxygen used during your procedure.  There is no need for concern and it should clear up in a day or so.  SYMPTOMS TO REPORT IMMEDIATELY:   Following lower endoscopy (colonoscopy or flexible sigmoidoscopy):  Excessive amounts of blood in the stool  Significant tenderness or worsening of abdominal pains  Swelling of the abdomen that is new, acute  Fever of 100F or higher  For urgent or emergent issues, a gastroenterologist can be reached at any hour by calling [phone]. Do not use MyChart messaging for urgent concerns.    DIET:  We do recommend a small meal at first, but then you may proceed to your regular diet.  Drink plenty of fluids but you should avoid alcoholic beverages for 24 hours.  ACTIVITY:  You should plan to take it easy for the rest of today and you should NOT DRIVE or use heavy machinery until tomorrow (because  of the sedation medicines used during the test).    FOLLOW UP: Our staff will call the number listed on your records 48-72 hours following your procedure to check on you and address any questions or concerns that you may have regarding the information given to you following your procedure. If we do not reach you, we will leave a message.  We will attempt to reach you two times.  During this call, we will ask if you have developed any symptoms of COVID 19. If you develop any symptoms (ie: fever, flu-like symptoms, shortness of breath, cough etc.) before then, please call [phone].  If you test positive for Covid 19 in the 2 weeks post procedure, please call and report this information to us.    If any biopsies were taken you will be contacted by phone or by letter within the next 1-3 weeks.  Please call us at [phone] if you have not heard about the biopsies in 3 weeks.    SIGNATURES/CONFIDENTIALITY: You and/or your care partner have signed paperwork which will be entered into your electronic medical record.  These signatures attest to the fact that that the information above on your After Visit Summary has been reviewed and is understood.  Full responsibility of the confidentiality of this discharge information lies with you and/or your care-partner. 

## 2021-08-15 NOTE — Progress Notes (Signed)
[facility] History and Physical   Primary Care Physician:  Antony Blackbird, MD (Inactive)   Reason for Procedure:  Colorectal cancer screening  Plan:    Screening colonoscopy with possible interventions as needed     HPI: Alicia Robinson is a very pleasant 45 y.o. female here for screening colonoscopy. Denies any nausea, vomiting, abdominal pain, melena or bright red blood per rectum  The risks and benefits as well as alternatives of endoscopic procedure(s) have been discussed and reviewed. All questions answered. The patient agrees to proceed.    Past Medical History:  Diagnosis Date   Anxiety    Depression    Hyperlipidemia    Hypertension    Stroke [facility]) 2016   "mild"   Suicidal overdose ([place])     Past Surgical History:  Procedure Laterality Date   CHOLECYSTECTOMY     SIGMOIDOSCOPY     pt is unsure about this   TUBAL LIGATION     UPPER GASTROINTESTINAL ENDOSCOPY      Prior to Admission medications   Medication Sig Start Date End Date Taking? Authorizing Provider  Ascorbic Acid (VITAMIN C PO) Take by mouth daily.   Yes [provider]  atorvastatin (LIPITOR) 20 MG tablet TAKE 1 TABLET (20 MG TOTAL) BY MOUTH DAILY. FOR HIGH CHOLESTEROL 06/25/21 06/25/22 Yes Newlin, Charlane Ferretti, MD  buPROPion (WELLBUTRIN XL) 150 MG 24 hr tablet Take 1 tablet (150 mg total) by mouth every morning. 06/28/21  Yes Eulis Canner E, NP  buPROPion (WELLBUTRIN XL) 300 MG 24 hr tablet Take 1 tablet (300 mg total) by mouth every morning. 06/28/21  Yes Eulis Canner E, NP  busPIRone (BUSPAR) 10 MG tablet Take 1 tablet (10 mg total) by mouth 3 (three) times daily. 06/28/21  Yes Eulis Canner E, NP  hydrochlorothiazide (HYDRODIURIL) 25 MG tablet TAKE 1 TABLET (25 MG TOTAL) BY MOUTH DAILY. FOR HIGH BLOOD PRESSURE 06/25/21 06/25/22 Yes Charlott Rakes, MD  Multiple Vitamins-Minerals (MULTIVITAMIN ADULT PO) Take 1 tablet by mouth.   Yes [provider]  omeprazole  (PRILOSEC) 20 MG capsule Take 1 capsule (20 mg total) by mouth 2 (two) times daily before a meal. Patient taking differently: Take 20 mg by mouth 2 (two) times daily before a meal. Takes PRN 02/28/20  Yes Camillia Herter, NP  Turmeric (QC TUMERIC COMPLEX PO) Take by mouth.   Yes [provider]  predniSONE (DELTASONE) 20 MG tablet Take 1 tablet (20 mg total) by mouth daily with breakfast. Patient not taking: Reported on 08/15/2021 06/25/21   Charlott Rakes, MD  traZODone (DESYREL) 50 MG tablet Take 1-2 tablets (50-100 mg total) by mouth at bedtime as needed for sleep. Patient not taking: Reported on 08/15/2021 06/28/21 06/28/22  Salley Slaughter, NP    Current Outpatient Medications  Medication Sig Dispense Refill   Ascorbic Acid (VITAMIN C PO) Take by mouth daily.     atorvastatin (LIPITOR) 20 MG tablet TAKE 1 TABLET (20 MG TOTAL) BY MOUTH DAILY. FOR HIGH CHOLESTEROL 90 tablet 1   buPROPion (WELLBUTRIN XL) 150 MG 24 hr tablet Take 1 tablet (150 mg total) by mouth every morning. 30 tablet 3   buPROPion (WELLBUTRIN XL) 300 MG 24 hr tablet Take 1 tablet (300 mg total) by mouth every morning. 30 tablet 3   busPIRone (BUSPAR) 10 MG tablet Take 1 tablet (10 mg total) by mouth 3 (three) times daily. 90 tablet 3   hydrochlorothiazide (HYDRODIURIL) 25 MG tablet TAKE 1 TABLET (25 MG TOTAL) BY  MOUTH DAILY. FOR HIGH BLOOD PRESSURE 90 tablet 1   Multiple Vitamins-Minerals (MULTIVITAMIN ADULT PO) Take 1 tablet by mouth.     omeprazole (PRILOSEC) 20 MG capsule Take 1 capsule (20 mg total) by mouth 2 (two) times daily before a meal. (Patient taking differently: Take 20 mg by mouth 2 (two) times daily before a meal. Takes PRN) 180 capsule 0   Turmeric (QC TUMERIC COMPLEX PO) Take by mouth.     predniSONE (DELTASONE) 20 MG tablet Take 1 tablet (20 mg total) by mouth daily with breakfast. (Patient not taking: Reported on 08/15/2021) 5 tablet 0   traZODone (DESYREL) 50 MG tablet Take 1-2 tablets  (50-100 mg total) by mouth at bedtime as needed for sleep. (Patient not taking: Reported on 08/15/2021) 60 tablet 3   Current Facility-Administered Medications  Medication Dose Route Frequency Provider Last Rate Last Admin   0.9 %  sodium chloride infusion  500 mL Intravenous Once Mauri Pole, MD        Allergies as of 08/15/2021 - Review Complete 08/15/2021  Allergen Reaction Noted   Nsaids  07/12/2020    Family History  Problem Relation Age of Onset   Hypertension Mother    Diabetes Mother    Hypertension Father    Diabetes Father    Diabetes Maternal Grandmother    Hypertension Maternal Grandmother    Colon cancer Neg Hx    Esophageal cancer Neg Hx    Rectal cancer Neg Hx    Stomach cancer Neg Hx     Social History   Socioeconomic History   Marital status: Single    Spouse name: Not on file   Number of children: 3   Years of education: Not on file   Highest education level: Associate degree: occupational, Hotel manager, or vocational program  Occupational History   Not on file  Tobacco Use   Smoking status: Never   Smokeless tobacco: Never  Vaping Use   Vaping Use: Never used  Substance and Sexual Activity   Alcohol use: Yes    Comment: weekends, socially   Drug use: Not Currently    Types: Marijuana   Sexual activity: Not Currently  Other Topics Concern   Not on file  Social History Narrative   Not on file   Social Determinants of Health   Financial Resource Strain: Not on file  Food Insecurity: Not on file  Transportation Needs: Not on file  Physical Activity: Not on file  Stress: Not on file  Social Connections: Not on file  Intimate Partner Violence: Not on file    Review of Systems:  All other review of systems negative except as mentioned in the HPI.  Physical Exam: Vital signs in last 24 hours: BP 129/77   Pulse 81   Temp (!) 97.3 F (36.3 C) (Skin)   Ht 5\' 10"  (1.778 m)   Wt 185 lb (83.9 kg)   SpO2 100%   BMI 26.54 kg/m      General:   Alert, NAD Lungs:  Clear .   Heart:  Regular rate and rhythm Abdomen:  Soft, nontender and nondistended. Neuro/Psych:  Alert and cooperative. Normal mood and affect. A and O x 3  Reviewed labs, radiology imaging, old records and pertinent past GI work up  Patient is appropriate for planned procedure(s) and anesthesia in an ambulatory setting   K. Denzil Magnuson , MD [phone]

## 2021-08-15 NOTE — Progress Notes (Signed)
Pt's states no medical or surgical changes since previsit or office visit. VS assessed by D.T 

## 2021-08-17 ENCOUNTER — Telehealth: Payer: Self-pay

## 2021-08-17 ENCOUNTER — Telehealth: Payer: Self-pay | Admitting: *Deleted

## 2021-08-17 NOTE — Telephone Encounter (Signed)
Attempted 2nd phone call. No answer. Left message.

## 2021-08-17 NOTE — Telephone Encounter (Signed)
Left message on follow up call. 

## 2021-08-24 ENCOUNTER — Ambulatory Visit (HOSPITAL_COMMUNITY): Payer: BC Managed Care – PPO | Admitting: Licensed Clinical Social Worker

## 2021-08-27 ENCOUNTER — Other Ambulatory Visit: Payer: Self-pay

## 2021-08-28 ENCOUNTER — Other Ambulatory Visit: Payer: Self-pay

## 2021-09-17 ENCOUNTER — Other Ambulatory Visit: Payer: Self-pay

## 2021-09-18 ENCOUNTER — Other Ambulatory Visit: Payer: Self-pay

## 2021-09-25 ENCOUNTER — Ambulatory Visit: Payer: BC Managed Care – PPO | Attending: Family Medicine | Admitting: Family Medicine

## 2021-09-28 ENCOUNTER — Telehealth (INDEPENDENT_AMBULATORY_CARE_PROVIDER_SITE_OTHER): Payer: BC Managed Care – PPO | Admitting: Psychiatry

## 2021-09-28 ENCOUNTER — Other Ambulatory Visit: Payer: Self-pay

## 2021-09-28 ENCOUNTER — Encounter (HOSPITAL_COMMUNITY): Payer: Self-pay | Admitting: Psychiatry

## 2021-09-28 DIAGNOSIS — F330 Major depressive disorder, recurrent, mild: Secondary | ICD-10-CM | POA: Diagnosis not present

## 2021-09-28 DIAGNOSIS — F411 Generalized anxiety disorder: Secondary | ICD-10-CM | POA: Diagnosis not present

## 2021-09-28 MED ORDER — BUPROPION HCL ER (XL) 300 MG PO TB24
300.0000 mg | ORAL_TABLET | ORAL | 3 refills | Status: DC
  Filled 2021-09-28 – 2021-10-05 (×2): qty 30, 30d supply, fill #0
  Filled 2021-10-29: qty 30, 30d supply, fill #1

## 2021-09-28 MED ORDER — BUPROPION HCL ER (XL) 150 MG PO TB24
150.0000 mg | ORAL_TABLET | ORAL | 3 refills | Status: DC
  Filled 2021-09-28 – 2021-11-14 (×5): qty 30, 30d supply, fill #0

## 2021-09-28 MED ORDER — TRAZODONE HCL 50 MG PO TABS
50.0000 mg | ORAL_TABLET | Freq: Every evening | ORAL | 3 refills | Status: DC | PRN
Start: 2021-09-28 — End: 2021-12-04
  Filled 2021-09-28 – 2021-10-05 (×2): qty 60, 30d supply, fill #0
  Filled 2021-10-29: qty 60, 30d supply, fill #1

## 2021-09-28 NOTE — Progress Notes (Signed)
BH MD/PA/NP OP Progress Note Virtual Visit via Video Note  I connected with Alicia Robinson on 09/28/21 at  9:00 AM EST by a video enabled telemedicine application and verified that I am speaking with the correct person using two identifiers.  Location: Patient: Home Provider: Clinic   I discussed the limitations of evaluation and management by telemedicine and the availability of in person appointments. The patient expressed understanding and agreed to proceed.  I provided 30 minutes of non-face-to-face time during this encounter.      09/28/2021 8:56 AM Alicia Robinson  MRN:  [id number]  Chief Complaint: "I have been sleeping through the night"  HPI: 46 year old female seen today for follow up of psychiatric evaluation. She has a psychiatric history of depression, anxiety, SI. She is currently managed on Wellbutrin 450 mg XL, Buspar 10 mg three times daily, and Trazodone 50-100 mg nightly. She notes that she no longer takes Buspar and reports that her other medications are effective in managing her psychiatric conditions.  Today she was was well groomed, pleasant, cooperative, engaged in conversation and maintained eye contact.  She informed Probation officer that she has been sleeping better and feeling mentally better. She reports that she is visiting [place] her [place] and enjoying her family. Patient also notes that her anxiety and depression has improved since her last visit. Today provider conducted GAD-7 today scoring 7, last visit scored 16. Provider also conducted PHQ-9 today scoring 3, last visit scored 15. Today she denies SI/HI/VAH, paranoia, or mania. She endorses adequate sleep and appetite.   At this time patient notes that she wants to discontinue Buspar. She will continue all other medications as prescribed and follow up with outpatient counseling for therapy.  No other concerns noted at this time.   Visit Diagnosis:    ICD-10-CM   1. Mild episode of recurrent  major depressive disorder (HCC)  F33.0 buPROPion (WELLBUTRIN XL) 150 MG 24 hr tablet    buPROPion (WELLBUTRIN XL) 300 MG 24 hr tablet    traZODone (DESYREL) 50 MG tablet    2. Generalized anxiety disorder  F41.1 traZODone (DESYREL) 50 MG tablet      Past Psychiatric History: depression, anxiety, and suicidal ideation.  Past Medical History:  Past Medical History:  Diagnosis Date   Anxiety    Depression    Hyperlipidemia    Hypertension    Stroke [facility]) 2016   "mild"   Suicidal overdose ([place])     Past Surgical History:  Procedure Laterality Date   CHOLECYSTECTOMY     SIGMOIDOSCOPY     pt is unsure about this   TUBAL LIGATION     UPPER GASTROINTESTINAL ENDOSCOPY      Family Psychiatric History: Mother depression  Family History:  Family History  Problem Relation Age of Onset   Hypertension Mother    Diabetes Mother    Hypertension Father    Diabetes Father    Diabetes Maternal Grandmother    Hypertension Maternal Grandmother    Colon cancer Neg Hx    Esophageal cancer Neg Hx    Rectal cancer Neg Hx    Stomach cancer Neg Hx     Social History:  Social History   Socioeconomic History   Marital status: Single    Spouse name: Not on file   Number of children: 3   Years of education: Not on file   Highest education level: Associate degree: occupational, Hotel manager, or vocational program  Occupational History  Not on file  Tobacco Use   Smoking status: Never   Smokeless tobacco: Never  Vaping Use   Vaping Use: Never used  Substance and Sexual Activity   Alcohol use: Yes    Comment: weekends, socially   Drug use: Not Currently    Types: Marijuana   Sexual activity: Not Currently  Other Topics Concern   Not on file  Social History Narrative   Not on file   Social Determinants of Health   Financial Resource Strain: Not on file  Food Insecurity: Not on file  Transportation Needs: Not on file  Physical Activity: Not on file  Stress: Not on file   Social Connections: Not on file    Allergies:  Allergies  Allergen Reactions   Nsaids     Renal insufficiency- no per pt    Metabolic Disorder Labs: Lab Results  Component Value Date   HGBA1C 5.7 (H) 03/10/2020   MPG 108.28 11/19/2018   MPG 105.41 09/22/2017   No results found for: PROLACTIN Lab Results  Component Value Date   CHOL 185 07/03/2021   TRIG 81 07/03/2021   HDL 50 07/03/2021   CHOLHDL 3.7 07/03/2021   VLDL 16 11/19/2018   LDLCALC 120 (H) 07/03/2021   LDLCALC 190 (H) 03/10/2020   Lab Results  Component Value Date   TSH 1.000 03/10/2020   TSH 1.578 11/19/2018    Therapeutic Level Labs: No results found for: LITHIUM No results found for: VALPROATE No components found for:  CBMZ  Current Medications: Current Outpatient Medications  Medication Sig Dispense Refill   Ascorbic Acid (VITAMIN C PO) Take by mouth daily.     atorvastatin (LIPITOR) 20 MG tablet TAKE 1 TABLET (20 MG TOTAL) BY MOUTH DAILY. FOR HIGH CHOLESTEROL 90 tablet 1   buPROPion (WELLBUTRIN XL) 150 MG 24 hr tablet Take 1 tablet (150 mg total) by mouth every morning. 30 tablet 3   buPROPion (WELLBUTRIN XL) 300 MG 24 hr tablet Take 1 tablet (300 mg total) by mouth every morning. 30 tablet 3   busPIRone (BUSPAR) 10 MG tablet Take 1 tablet (10 mg total) by mouth 3 (three) times daily. 90 tablet 3   hydrochlorothiazide (HYDRODIURIL) 25 MG tablet TAKE 1 TABLET (25 MG TOTAL) BY MOUTH DAILY. FOR HIGH BLOOD PRESSURE 90 tablet 1   omeprazole (PRILOSEC) 20 MG capsule Take 1 capsule (20 mg total) by mouth 2 (two) times daily before a meal. (Patient taking differently: Take 20 mg by mouth 2 (two) times daily before a meal. Takes PRN) 180 capsule 0   predniSONE (DELTASONE) 20 MG tablet Take 1 tablet (20 mg total) by mouth daily with breakfast. (Patient not taking: Reported on 08/15/2021) 5 tablet 0   traZODone (DESYREL) 50 MG tablet Take 1-2 tablets (50-100 mg total) by mouth at bedtime as needed for sleep. 60  tablet 3   Turmeric (QC TUMERIC COMPLEX PO) Take by mouth.     No current facility-administered medications for this visit.     Musculoskeletal: Strength & Muscle Tone:  Unable to assess due to telephone visit Gait & Station:  Unable to assess due to telephone visit Patient leans: N/A  Psychiatric Specialty Exam: Review of Systems  There were no vitals taken for this visit.There is no height or weight on file to calculate BMI.  General Appearance:  Unable to assess due to telephone visit  Eye Contact:   Unable to assess due to telephone visit  Speech:  Clear and Coherent and Normal Rate  Volume:  Normal  Mood:  Anxious and Depressed  Affect:  Appropriate and Congruent  Thought Process:  Coherent, Goal Directed and Linear  Orientation:  Full (Time, Place, and Person)  Thought Content: WDL and Logical   Suicidal Thoughts:  No  Homicidal Thoughts:  No  Memory:  Immediate;   Good Recent;   Good Remote;   Good  Judgement:  Good  Insight:  Good  Psychomotor Activity:   Unable to assess due to telephone visit  Concentration:  Concentration: Good and Attention Span: Good  Recall:  Good  Fund of Knowledge: Good  Language: Good  Akathisia:   Unable to assess due to telephone visit  Handed:  Right  AIMS (if indicated): Not done  Assets:  Communication Skills Desire for Improvement Financial Resources/Insurance Housing Social Support  ADL's:  Intact  Cognition: WNL  Sleep:  Good   Screenings: AIMS    Flowsheet Row Admission (Discharged) from 11/19/2018 in [place] [facility] Admission (Discharged) from 09/21/2017 in [place] [facility] Admission (Discharged) from OP Visit from 07/11/2017 in [place] [facility]  AIMS Total Score 0 0 0      AUDIT    Flowsheet Row Admission (Discharged) from 11/19/2018 in [place] [facility] Admission (Discharged) from OP Visit from 07/11/2017  in [place] [facility]  Alcohol Use Disorder Identification Test Final Score (AUDIT) 0 1      GAD-7    Flowsheet Row Video Visit from 09/28/2021 in [facility] Video Visit from 06/28/2021 in [facility] Office Visit from 06/25/2021 in [place] Video Visit from 03/28/2021 in [facility] Video Visit from 12/27/2020 in [facility]  Total GAD-7 Score 7 16 14 11  0      PHQ2-9    Flowsheet Row Video Visit from 09/28/2021 in [facility] Video Visit from 06/28/2021 in [facility] Office Visit from 06/25/2021 in [place] Video Visit from 03/28/2021 in [facility] Video Visit from 12/27/2020 in [facility]  PHQ-2 Total Score 1 4 4 3  0  PHQ-9 Total Score 3 15 15 10 1       Flowsheet Row Video Visit from 12/27/2020 in [facility] Admission (Discharged) from 11/19/2018 in [place] [facility] ED from 11/18/2018 in [place] DEPT  C-SSRS RISK CATEGORY No Risk Low Risk High Risk        Assessment and Plan: Patient notes that her anxiety, depression, and sleep has improved since her last visit. At this time patient notes that she wants to discontinue Buspar. She will continue all other medications as prescribed and follow up with outpatient counseling for therapy 1. Mild episode of recurrent major depressive disorder (HCC)  Continue- busROPion HCl ER, XL, 450 MG TB24; Take 1 tablet by mouth daily.  Dispense: 30 tablet; Refill: 3 Continue- traZODone (DESYREL) 50 MG tablet; Take 1-2 tablets (50-100 mg total) by mouth at bedtime as needed for sleep.  Dispense: 60 tablet; Refill: 3  2. Generalized anxiety  disorder  Continue- traZODone (DESYREL) 50 MG tablet; Take 1-2 tablets (50-100 mg total) by mouth at bedtime as needed for sleep.  Dispense: 60 tablet; Refill: 3  Follow up in 3 months Follow up with therapy.  Salley Slaughter, NP 09/28/2021, 8:56 AM

## 2021-10-04 ENCOUNTER — Other Ambulatory Visit: Payer: Self-pay

## 2021-10-05 ENCOUNTER — Other Ambulatory Visit: Payer: Self-pay

## 2021-10-24 ENCOUNTER — Other Ambulatory Visit: Payer: Self-pay

## 2021-10-29 ENCOUNTER — Other Ambulatory Visit (HOSPITAL_COMMUNITY): Payer: Self-pay

## 2021-10-31 ENCOUNTER — Other Ambulatory Visit: Payer: Self-pay

## 2021-11-06 ENCOUNTER — Other Ambulatory Visit (HOSPITAL_COMMUNITY): Payer: Self-pay

## 2021-11-06 ENCOUNTER — Other Ambulatory Visit: Payer: Self-pay

## 2021-11-13 ENCOUNTER — Other Ambulatory Visit: Payer: Self-pay

## 2021-11-14 ENCOUNTER — Other Ambulatory Visit: Payer: Self-pay

## 2021-12-04 ENCOUNTER — Other Ambulatory Visit (HOSPITAL_COMMUNITY): Payer: Self-pay

## 2021-12-04 ENCOUNTER — Other Ambulatory Visit: Payer: Self-pay

## 2021-12-04 ENCOUNTER — Telehealth (INDEPENDENT_AMBULATORY_CARE_PROVIDER_SITE_OTHER): Payer: BC Managed Care – PPO | Admitting: Psychiatry

## 2021-12-04 DIAGNOSIS — F330 Major depressive disorder, recurrent, mild: Secondary | ICD-10-CM | POA: Diagnosis not present

## 2021-12-04 DIAGNOSIS — F411 Generalized anxiety disorder: Secondary | ICD-10-CM

## 2021-12-04 MED ORDER — BUPROPION HCL ER (XL) 300 MG PO TB24
300.0000 mg | ORAL_TABLET | ORAL | 0 refills | Status: DC
  Filled 2021-12-04: qty 30, 30d supply, fill #0
  Filled 2021-12-05 – 2022-01-01 (×2): qty 30, 30d supply, fill #1

## 2021-12-04 MED ORDER — BUPROPION HCL ER (XL) 150 MG PO TB24
150.0000 mg | ORAL_TABLET | ORAL | 0 refills | Status: DC
  Filled 2021-12-04 – 2021-12-05 (×2): qty 90, 90d supply, fill #0
  Filled 2021-12-14: qty 30, 30d supply, fill #0

## 2021-12-04 MED ORDER — TRAZODONE HCL 50 MG PO TABS
50.0000 mg | ORAL_TABLET | Freq: Every evening | ORAL | 2 refills | Status: DC | PRN
Start: 2021-12-04 — End: 2022-03-06
  Filled 2021-12-04: qty 60, 30d supply, fill #0
  Filled 2021-12-05: qty 60, 30d supply, fill #1

## 2021-12-04 NOTE — Progress Notes (Signed)
BH MD/PA/NP OP Progress Note  12/04/2021 10:48 AM Alicia Robinson  MRN:  [id number]   Virtual Visit via Telephone Note  I connected with Alicia Robinson on 12/04/21 at 10:30 AM EDT by telephone and verified that I am speaking with the correct person using two identifiers.  Location: Patient: home Provider: off site   I discussed the limitations, risks, security and privacy concerns of performing an evaluation and management service by telephone and the availability of in person appointments. I also discussed with the patient that there may be a patient responsible charge related to this service. The patient expressed understanding and agreed to proceed.     I discussed the assessment and treatment plan with the patient. The patient was provided an opportunity to ask questions and all were answered. The patient agreed with the plan and demonstrated an understanding of the instructions.   The patient was advised to call back or seek an in-person evaluation if the symptoms worsen or if the condition fails to improve as anticipated.    I provided 10 minutes of non-face-to-face time during this encounter.   Mcneil Sober, NP   Chief Complaint: Medication management   HPI: Alicia Robinson is a 46 year old female presenting to [facility] behavioral health outpatient for a follow-up psychiatric evaluation.  She has a psychiatric history of major depressive disorder, generalized anxiety disorder and PTSD.  Her symptoms are managed with Wellbutrin 450 mg daily, BuSpar 10 mg 3 times daily, and trazodone 50-100 mg at bedtime as needed for sleep.  Patient reports that medications are effective and denies the need for dose adjustment today.  Patient reports medication compliance with Wellbutrin and trazodone and denies adverse medication reactions.  Patient reports that she has not been taking BuSpar because she no longer has symptoms of anxiety.  Patient request that BuSpar be discontinued.    Patient  is alert and oriented x4, calm, pleasant and willing to engage.  She reports that her mood was depressed yesterday but today she feels better, reporting a good mood.  Patient reports plans to move in June and will require a note to have a dog companion.  She reports good sleep and appetite.  Patient denies suicidal or homicidal ideations, paranoia, delusional thought, auditory or visual hallucinations.  Visit Diagnosis:    ICD-10-CM   1. Mild episode of recurrent major depressive disorder (HCC)  F33.0 buPROPion (WELLBUTRIN XL) 150 MG 24 hr tablet    buPROPion (WELLBUTRIN XL) 300 MG 24 hr tablet    traZODone (DESYREL) 50 MG tablet    2. Generalized anxiety disorder  F41.1 traZODone (DESYREL) 50 MG tablet      Past Psychiatric History: Generalized anxiety disorder, major depressive disorder.  PTSD  Past Medical History:  Past Medical History:  Diagnosis Date   Anxiety    Depression    Hyperlipidemia    Hypertension    Stroke (HCC) 2016   "mild"   Suicidal overdose (HCC)     Past Surgical History:  Procedure Laterality Date   CHOLECYSTECTOMY     SIGMOIDOSCOPY     pt is unsure about this   TUBAL LIGATION     UPPER GASTROINTESTINAL ENDOSCOPY      Family Psychiatric History: None known  Family History:  Family History  Problem Relation Age of Onset   Hypertension Mother    Diabetes Mother    Hypertension Father    Diabetes Father    Diabetes Maternal Grandmother    Hypertension Maternal Grandmother  Colon cancer Neg Hx    Esophageal cancer Neg Hx    Rectal cancer Neg Hx    Stomach cancer Neg Hx     Social History:  Social History   Socioeconomic History   Marital status: Single    Spouse name: Not on file   Number of children: 3   Years of education: Not on file   Highest education level: Associate degree: occupational, Scientist, product/process development, or vocational program  Occupational History   Not on file  Tobacco Use   Smoking status: Never   Smokeless tobacco: Never   Vaping Use   Vaping Use: Never used  Substance and Sexual Activity   Alcohol use: Yes    Comment: weekends, socially   Drug use: Not Currently    Types: Marijuana   Sexual activity: Not Currently  Other Topics Concern   Not on file  Social History Narrative   Not on file   Social Determinants of Health   Financial Resource Strain: Not on file  Food Insecurity: Not on file  Transportation Needs: Not on file  Physical Activity: Not on file  Stress: Not on file  Social Connections: Not on file    Allergies:  Allergies  Allergen Reactions   Nsaids     Renal insufficiency- no per pt    Metabolic Disorder Labs: Lab Results  Component Value Date   HGBA1C 5.7 (H) 03/10/2020   MPG 108.28 11/19/2018   MPG 105.41 09/22/2017   No results found for: PROLACTIN Lab Results  Component Value Date   CHOL 185 07/03/2021   TRIG 81 07/03/2021   HDL 50 07/03/2021   CHOLHDL 3.7 07/03/2021   VLDL 16 11/19/2018   LDLCALC 120 (H) 07/03/2021   LDLCALC 190 (H) 03/10/2020   Lab Results  Component Value Date   TSH 1.000 03/10/2020   TSH 1.578 11/19/2018    Therapeutic Level Labs: No results found for: LITHIUM No results found for: VALPROATE No components found for:  CBMZ  Current Medications: Current Outpatient Medications  Medication Sig Dispense Refill   Ascorbic Acid (VITAMIN C PO) Take by mouth daily.     atorvastatin (LIPITOR) 20 MG tablet TAKE 1 TABLET (20 MG TOTAL) BY MOUTH DAILY. FOR HIGH CHOLESTEROL 90 tablet 1   buPROPion (WELLBUTRIN XL) 150 MG 24 hr tablet Take 1 tablet (150 mg total) by mouth every morning. 90 tablet 0   buPROPion (WELLBUTRIN XL) 300 MG 24 hr tablet Take 1 tablet (300 mg total) by mouth every morning. 90 tablet 0   hydrochlorothiazide (HYDRODIURIL) 25 MG tablet TAKE 1 TABLET (25 MG TOTAL) BY MOUTH DAILY. FOR HIGH BLOOD PRESSURE 90 tablet 1   omeprazole (PRILOSEC) 20 MG capsule Take 1 capsule (20 mg total) by mouth 2 (two) times daily before a meal.  (Patient taking differently: Take 20 mg by mouth 2 (two) times daily before a meal. Takes PRN) 180 capsule 0   predniSONE (DELTASONE) 20 MG tablet Take 1 tablet (20 mg total) by mouth daily with breakfast. (Patient not taking: Reported on 08/15/2021) 5 tablet 0   traZODone (DESYREL) 50 MG tablet Take 1-2 tablets (50-100 mg total) by mouth at bedtime as needed for sleep. 60 tablet 2   Turmeric (QC TUMERIC COMPLEX PO) Take by mouth.     No current facility-administered medications for this visit.     Musculoskeletal: Strength & Muscle Tone: N/A virtual visit Gait & Station: N/A virtual visit Patient leans: N/A  Psychiatric Specialty Exam: Review of Systems  Psychiatric/Behavioral:  Negative for hallucinations, self-injury and suicidal ideas.   All other systems reviewed and are negative.  There were no vitals taken for this visit.There is no height or weight on file to calculate BMI.  General Appearance: NA  Eye Contact:  NA  Speech:  Clear and Coherent  Volume: Normal  Mood: Euthymic  Affect:  NA  Thought Process:  Goal Directed  Orientation:  Full (Time, Place, and Person)  Thought Content: Logical   Suicidal Thoughts:  No  Homicidal Thoughts:  No  Memory: Good  Judgement:  Good  Insight:  Good  Psychomotor Activity:  NA  Concentration: Good  Recall:  Good  Fund of Knowledge: Good  Language: Good  Akathisia:  NA  Handed:  Right  AIMS (if indicated): not done  Assets:  Communication Skills Desire for Improvement  ADL's:  Intact  Cognition: WNL  Sleep:  Good   Screenings: AIMS    Flowsheet Row [facility] Admission (Discharged) from [facility]  AIMS Total Score 0 0 0      AUDIT    Flowsheet Row [facility]) [facility] Admission (Discharged) from OP [facility]  Alcohol Use Disorder Identification Test Final Score (AUDIT) 0 1      GAD-7    Flowsheet Row Video Visit from 09/28/2021 in [facility] Video Visit from 06/28/2021 in [facility] Office Visit from 06/25/2021 in [facility] Video Visit from 03[facility]28/2021 in [facility] Video Visit from 12/27/2020 in [facility]  Total GAD-7 Score 7 16 14 11  0      PHQ2-9    Flowsheet Row Video Visit from 09/28/2021 in [facility] Video Visit from 06/28/2021 in [facility] Office Visit from 06/25/2021 in [facility] Video Visit from 03/28/2021 in [facility] Video Visit from 12/27/2020 in [facility]  PHQ-2 Total Score 1 4 4 3  0  PHQ-9 Total Score 3 15 15 10 1       Flowsheet Row Video Visit from 12/27/2020 in [facility] Ad[facility]charged) [facility] ED from 11/18/2018 in [facility]  C-SSRS RISK CATEGORY No Risk Low Risk High Risk        Assessment and Plan: Alicia Robinson is a 46 year old female presenting to [facility] behavioral health outpatient for a follow-up psychiatric evaluation.  She has a psychiatric history of major depressive disorder, generalized anxiety disorder and PTSD.  Her symptoms are managed with Wellbutrin 450 mg daily, BuSpar 10 mg 3 times daily, and trazodone 50-100 mg at bedtime as needed for sleep.  Patient reports that medications are effective and denies the need for dose adjustment today.  Patient reports medication compliance with  Wellbutrin and trazodone and denies adverse medication reactions.  Patient reports that she no longer takes BuSpar because she does not have anxiety symptoms.  BuSpar discontinued.  Remaining medications refilled at current dosages.  Collaboration of Care: Collaboration of Care: Medication Management AEB medications E scribed to patient's preferred pharmacy.    1. Mild  episode of recurrent major depressive disorder (HCC)  - buPROPion (WELLBUTRIN XL) 150 MG 24 hr tablet; Take 1 tablet (150 mg total) by mouth every morning.  Dispense: 90 tablet; Refill: 0 - buPROPion (WELLBUTRIN XL) 300 MG 24 hr tablet; Take 1 tablet (300 mg total) by mouth every morning.  Dispense: 90 tablet; Refill: 0 - traZODone (DESYREL) 50 MG tablet; Take 1-2 tablets (50-100 mg total) by mouth at bedtime as needed for sleep.  Dispense: 60 tablet; Refill: 2  2. Generalized anxiety disorder  - traZODone (DESYREL) 50 MG tablet; Take 1-2 tablets (50-100 mg total) by mouth at bedtime as needed for sleep.  Dispense: 60 tablet; Refill: 2   Return to care in 3 months  Patient/Guardian was advised Release of Information must be obtained prior to any record release in order to collaborate their care with an outside provider. Patient/Guardian was advised if they have not already done so to contact the registration department to sign all necessary forms in order for [place] to release information regarding their care.   Consent: Patient/Guardian gives verbal consent for treatment and assignment of benefits for services provided during this visit. Patient/Guardian expressed understanding and agreed to proceed.    Mcneil Sober, NP 12/04/2021, 10:48 AM

## 2021-12-06 ENCOUNTER — Other Ambulatory Visit (HOSPITAL_COMMUNITY): Payer: Self-pay

## 2021-12-07 ENCOUNTER — Telehealth (HOSPITAL_COMMUNITY): Payer: BC Managed Care – PPO | Admitting: Psychiatry

## 2021-12-14 ENCOUNTER — Other Ambulatory Visit (HOSPITAL_COMMUNITY): Payer: Self-pay

## 2022-01-01 ENCOUNTER — Other Ambulatory Visit (HOSPITAL_COMMUNITY): Payer: Self-pay

## 2022-01-01 ENCOUNTER — Other Ambulatory Visit: Payer: Self-pay | Admitting: Family Medicine

## 2022-01-01 DIAGNOSIS — I10 Essential (primary) hypertension: Secondary | ICD-10-CM

## 2022-01-01 DIAGNOSIS — E785 Hyperlipidemia, unspecified: Secondary | ICD-10-CM

## 2022-01-01 MED ORDER — HYDROCHLOROTHIAZIDE 25 MG PO TABS
ORAL_TABLET | ORAL | 0 refills | Status: DC
  Filled 2022-01-01: qty 30, 30d supply, fill #0

## 2022-01-01 MED ORDER — ATORVASTATIN CALCIUM 20 MG PO TABS
ORAL_TABLET | ORAL | 0 refills | Status: DC
  Filled 2022-01-01: qty 30, 30d supply, fill #0

## 2022-01-01 NOTE — Telephone Encounter (Signed)
Requested medications are due for refill today.  yes ? ?Requested medications are on the active medications list.  yes ? ?Last refill. Both refilled 06/25/2021 6 month supply. ? ?Future visit scheduled.   no ? ?Notes to clinic.  Cammie Fulp listed as PCP. Pt was to [facility] 09/18/2021 for follow up. No return visit. Labs are expired. ? ? ? ?Requested Prescriptions  ?Pending Prescriptions Disp Refills  ? atorvastatin (LIPITOR) 20 MG tablet 90 tablet 1  ?  Sig: TAKE 1 TABLET (20 MG TOTAL) BY MOUTH DAILY. FOR HIGH CHOLESTEROL  ?  ? Cardiovascular:  Antilipid - Statins Failed - 01/01/2022  1:54 PM  ?  ?  Failed - Lipid Panel in normal range within the last 12 months  ?  Cholesterol, Total  ?Date Value Ref Range Status  ?07/03/2021 185 100 - 199 mg/dL Final  ? ?LDL Chol Calc (NIH)  ?Date Value Ref Range Status  ?07/03/2021 120 (H) 0 - 99 mg/dL Final  ? ?HDL  ?Date Value Ref Range Status  ?07/03/2021 50 >39 mg/dL Final  ? ?Triglycerides  ?Date Value Ref Range Status  ?07/03/2021 81 0 - 149 mg/dL Final  ? ?  ?  ?  Passed - Patient is not pregnant  ?  ?  Passed - Valid encounter within last 12 months  ?  Recent Outpatient Visits   ? ?      ? 6 months ago Chronic pain of right knee  ? [place], MD  ? 1 year ago Influenza vaccine refused  ? [facility] Swords, Darrick Penna, MD  ? 1 year ago Essential hypertension  ? [place], NP  ? 2 years ago Encounter for annual health examination  ? [place] Fulp, [place], MD  ? 2 years ago Essential hypertension  ? [place] Fulp, [place], MD  ? ?  ?  ? ? ?  ?  ?  ? hydrochlorothiazide (HYDRODIURIL) 25 MG tablet 90 tablet 1  ?  Sig: TAKE 1 TABLET (25 MG TOTAL) BY MOUTH DAILY. FOR HIGH BLOOD PRESSURE  ?  ? Cardiovascular: Diuretics - Thiazide Failed - 01/01/2022  1:54 PM  ?  ?  Failed - Cr in normal range and within  180 days  ?  Creatinine, Ser  ?Date Value Ref Range Status  ?07/03/2021 1.26 (H) 0.57 - 1.00 mg/dL Final  ?  ?  ?  ?  Failed - K in normal range and within 180 days  ?  Potassium  ?Date Value Ref Range Status  ?07/03/2021 4.3 3.5 - 5.2 mmol/L Final  ?  ?  ?  ?  Failed - Na in normal range and within 180 days  ?  Sodium  ?Date Value Ref Range Status  ?07/03/2021 139 134 - 144 mmol/L Final  ?  ?  ?  ?  Failed - Valid encounter within last 6 months  ?  Recent Outpatient Visits   ? ?      ? 6 months ago Chronic pain of right knee  ? [place], MD  ? 1 year ago Influenza vaccine refused  ? [facility], Darrick Penna, MD  ? 1 year ago Essential hypertension  ? [place], [place] J, NP  ? 2 years ago  Encounter for annual health examination  ? [place] Fulp, [place], MD  ? 2 years ago Essential hypertension  ? [place] Fulp, [place], MD  ? ?  ?  ? ? ?  ?  ?  Passed - Last BP in normal range  ?  BP Readings from Last 1 Encounters:  ?08/15/21 127/82  ?  ?  ?  ?  ?  ?

## 2022-03-06 ENCOUNTER — Telehealth (INDEPENDENT_AMBULATORY_CARE_PROVIDER_SITE_OTHER): Payer: BC Managed Care – PPO | Admitting: Psychiatry

## 2022-03-06 ENCOUNTER — Other Ambulatory Visit: Payer: Self-pay

## 2022-03-06 ENCOUNTER — Encounter (HOSPITAL_COMMUNITY): Payer: Self-pay | Admitting: Psychiatry

## 2022-03-06 DIAGNOSIS — F330 Major depressive disorder, recurrent, mild: Secondary | ICD-10-CM

## 2022-03-06 DIAGNOSIS — F411 Generalized anxiety disorder: Secondary | ICD-10-CM | POA: Diagnosis not present

## 2022-03-06 MED ORDER — BUPROPION HCL ER (XL) 150 MG PO TB24
150.0000 mg | ORAL_TABLET | ORAL | 3 refills | Status: DC
  Filled 2022-03-06: qty 30, 30d supply, fill #0

## 2022-03-06 MED ORDER — BUPROPION HCL ER (XL) 300 MG PO TB24
300.0000 mg | ORAL_TABLET | ORAL | 3 refills | Status: DC
  Filled 2022-03-06: qty 30, 30d supply, fill #0

## 2022-03-06 MED ORDER — TRAZODONE HCL 50 MG PO TABS
50.0000 mg | ORAL_TABLET | Freq: Every evening | ORAL | 3 refills | Status: DC | PRN
Start: 2022-03-06 — End: 2022-06-06
  Filled 2022-03-06: qty 60, 30d supply, fill #0

## 2022-03-06 MED ORDER — BUSPIRONE HCL 10 MG PO TABS
10.0000 mg | ORAL_TABLET | Freq: Three times a day (TID) | ORAL | 3 refills | Status: DC
Start: 2022-03-06 — End: 2022-06-06
  Filled 2022-03-06: qty 90, 30d supply, fill #0

## 2022-03-06 NOTE — Progress Notes (Signed)
BH MD/PA/NP OP Progress Note Virtual Visit via Telephone Note  I connected with Alicia Robinson on 03/06/22 at 10:30 AM EDT by telephone and verified that I am speaking with the correct person using two identifiers.  Location: Patient: home Provider: Clinic   I discussed the limitations, risks, security and privacy concerns of performing an evaluation and management service by telephone and the availability of in person appointments. I also discussed with the patient that there may be a patient responsible charge related to this service. The patient expressed understanding and agreed to proceed.   I provided 30 minutes of non-face-to-face time during this encounter.       03/06/2022 10:17 AM Alicia Robinson  MRN:  [id number]  Chief Complaint: "I have been feeling depressed"  HPI: 46 year old female seen today for follow up of psychiatric evaluation. She has a psychiatric history of depression, anxiety, SI. She is currently managed on Wellbutrin 450 mg XL and Trazodone 50-100 mg nightly. She notes that medications are somewhat effective in managing her psychiatric conditions.  Today she was unable to logon virtually so her assessment was done over the phone.  During exam she was pleasant, cooperative, and engaged in conversation.  She informed Probation officer that she has been feeling depressed.  She notes that she was concerned about her health as recently she got a biopsy of her skin.  She notes that recently her results came and and were within normal limits.  Patient reports that she is stressed because she has to move out of her apartment by the 30th.  She informed Probation officer that she needs a letter for her support animal.  Provider informed patient to have animal registered and then told her that a letter could be written.  She endorsed understanding and agreed.  Today provider conducted PHQ-9 and patient scored a 19.  Provider also conducted a GAD-7 and patient scored a 16.  She informed Probation officer that  she worries about her children, her grandchildren, her health, and her job.  She notes that she does not want to be hospitalized and is trying to become mentally stable.  Today she endorses passive SI however denies wanting to harm herself.  She denies SI/HI/VAH, mania, paranoia.    Today patient would like to restart BuSpar.  BuSpar reordered at 10 mg 3 times daily to help manage anxiety and depression.  She will continue her other medications as prescribed and follow-up with outpatient counseling for therapy.  No other concerns at this time.    Visit Diagnosis:    ICD-10-CM   1. Mild episode of recurrent major depressive disorder (HCC)  F33.0 buPROPion (WELLBUTRIN XL) 150 MG 24 hr tablet    traZODone (DESYREL) 50 MG tablet    buPROPion (WELLBUTRIN XL) 300 MG 24 hr tablet    2. Generalized anxiety disorder  F41.1 traZODone (DESYREL) 50 MG tablet      Past Psychiatric History: depression, anxiety, and suicidal ideation.  Past Medical History:  Past Medical History:  Diagnosis Date   Anxiety    Depression    Hyperlipidemia    Hypertension    Stroke [facility]) 2016   "mild"   Suicidal overdose ([place])     Past Surgical History:  Procedure Laterality Date   CHOLECYSTECTOMY     SIGMOIDOSCOPY     pt is unsure about this   TUBAL LIGATION     UPPER GASTROINTESTINAL ENDOSCOPY      Family Psychiatric History: Mother depression  Family History:  Family History  Problem Relation Age of Onset   Hypertension Mother    Diabetes Mother    Hypertension Father    Diabetes Father    Diabetes Maternal Grandmother    Hypertension Maternal Grandmother    Colon cancer Neg Hx    Esophageal cancer Neg Hx    Rectal cancer Neg Hx    Stomach cancer Neg Hx     Social History:  Social History   Socioeconomic History   Marital status: Single    Spouse name: Not on file   Number of children: 3   Years of education: Not on file   Highest education level: Associate degree: occupational,  Hotel manager, or vocational program  Occupational History   Not on file  Tobacco Use   Smoking status: Never   Smokeless tobacco: Never  Vaping Use   Vaping Use: Never used  Substance and Sexual Activity   Alcohol use: Yes    Comment: weekends, socially   Drug use: Not Currently    Types: Marijuana   Sexual activity: Not Currently  Other Topics Concern   Not on file  Social History Narrative   Not on file   Social Determinants of Health   Financial Resource Strain: Not on file  Food Insecurity: Not on file  Transportation Needs: No Transportation Needs (06/13/2020)   PRAPARE - Hydrologist (Medical): No    Lack of Transportation (Non-Medical): No  Physical Activity: Not on file  Stress: Not on file  Social Connections: Not on file    Allergies:  Allergies  Allergen Reactions   Nsaids     Renal insufficiency- no per pt    Metabolic Disorder Labs: Lab Results  Component Value Date   HGBA1C 5.7 (H) 03/10/2020   MPG 108.28 11/19/2018   MPG 105.41 09/22/2017   No results found for: "PROLACTIN" Lab Results  Component Value Date   CHOL 185 07/03/2021   TRIG 81 07/03/2021   HDL 50 07/03/2021   CHOLHDL 3.7 07/03/2021   VLDL 16 11/19/2018   LDLCALC 120 (H) 07/03/2021   LDLCALC 190 (H) 03/10/2020   Lab Results  Component Value Date   TSH 1.000 03/10/2020   TSH 1.578 11/19/2018    Therapeutic Level Labs: No results found for: "LITHIUM" No results found for: "VALPROATE" No results found for: "CBMZ"  Current Medications: Current Outpatient Medications  Medication Sig Dispense Refill   busPIRone (BUSPAR) 10 MG tablet Take 1 tablet (10 mg total) by mouth 3 (three) times daily. 90 tablet 3   Ascorbic Acid (VITAMIN C PO) Take by mouth daily.     atorvastatin (LIPITOR) 20 MG tablet TAKE 1 TABLET (20 MG TOTAL) BY MOUTH DAILY. FOR HIGH CHOLESTEROL 30 tablet 0   buPROPion (WELLBUTRIN XL) 150 MG 24 hr tablet Take 1 tablet (150 mg total) by  mouth every morning. 90 tablet 3   buPROPion (WELLBUTRIN XL) 300 MG 24 hr tablet Take 1 tablet (300 mg total) by mouth every morning. 90 tablet 3   hydrochlorothiazide (HYDRODIURIL) 25 MG tablet TAKE 1 TABLET (25 MG TOTAL) BY MOUTH DAILY. FOR HIGH BLOOD PRESSURE 30 tablet 0   omeprazole (PRILOSEC) 20 MG capsule Take 1 capsule (20 mg total) by mouth 2 (two) times daily before a meal. (Patient taking differently: Take 20 mg by mouth 2 (two) times daily before a meal. Takes PRN) 180 capsule 0   predniSONE (DELTASONE) 20 MG tablet Take 1 tablet (20 mg total) by mouth daily  with breakfast. (Patient not taking: Reported on 08/15/2021) 5 tablet 0   traZODone (DESYREL) 50 MG tablet Take 1-2 tablets (50-100 mg total) by mouth at bedtime as needed for sleep. 60 tablet 3   Turmeric (QC TUMERIC COMPLEX PO) Take by mouth.     No current facility-administered medications for this visit.     Musculoskeletal: Strength & Muscle Tone:  Unable to assess due to telephone visit Gait & Station:  Unable to assess due to telephone visit Patient leans: N/A  Psychiatric Specialty Exam: Review of Systems  There were no vitals taken for this visit.There is no height or weight on file to calculate BMI.  General Appearance:  Unable to assess due to telephone visit  Eye Contact:   Unable to assess due to telephone visit  Speech:  Clear and Coherent and Normal Rate  Volume:  Normal  Mood:  Anxious and Depressed  Affect:  Appropriate and Congruent  Thought Process:  Coherent, Goal Directed and Linear  Orientation:  Full (Time, Place, and Person)  Thought Content: WDL and Logical   Suicidal Thoughts:  Yes.  without intent/plan  Homicidal Thoughts:  No  Memory:  Immediate;   Good Recent;   Good Remote;   Good  Judgement:  Good  Insight:  Good  Psychomotor Activity:   Unable to assess due to telephone visit  Concentration:  Concentration: Good and Attention Span: Good  Recall:  Good  Fund of Knowledge: Good   Language: Good  Akathisia:   Unable to assess due to telephone visit  Handed:  Right  AIMS (if indicated): Not done  Assets:  Communication Skills Desire for Improvement Financial Resources/Insurance Housing Social Support  ADL's:  Intact  Cognition: WNL  Sleep:  Good   Screenings: AIMS    Flowsheet Row Admission (Discharged) from 11/19/2018 in [place] [facility] Admission (Discharged) from 09/21/2017 in [place] [facility] Admission (Discharged) from OP Visit from 07/11/2017 in [place] [facility]  AIMS Total Score 0 0 0      AUDIT    Flowsheet Row Admission (Discharged) from 11/19/2018 in [place] [facility] Admission (Discharged) from OP Visit from 07/11/2017 in [place] [facility]  Alcohol Use Disorder Identification Test Final Score (AUDIT) 0 1      GAD-7    Flowsheet Row Video Visit from 03/06/2022 in [facility] Video Visit from 09/28/2021 in [facility] Video Visit from 06/28/2021 in [facility] Office Visit from 06/25/2021 in [place] Video Visit from 03/28/2021 in [facility]  Total GAD-7 Score '16 7 16 14 11      '$ PHQ2-9    Flowsheet Row Video Visit from 03/06/2022 in [facility] Video Visit from 09/28/2021 in [facility] Video Visit from 06/28/2021 in [facility] Office Visit from 06/25/2021 in [place] Video Visit from 03/28/2021 in [facility]  PHQ-2 Total Score '5 1 4 4 3  '$ PHQ-9 Total Score '19 3 15 15 10      '$ Flowsheet Row Video Visit from 03/06/2022 in [facility] Video Visit from 12/27/2020 in [facility] Admission (Discharged) from 11/19/2018 in [place] [facility]  C-SSRS RISK CATEGORY Error: Q7 should not be populated when  Q6 is No No Risk Low Risk        Assessment and Plan: Patient notes that her anxiety and depression, has worsened since her last visit. Today patient would like to restart BuSpar.  BuSpar reordered at 10 mg 3 times daily to help manage anxiety and depression.  She will continue her other medications as prescribed  1. Mild episode of recurrent major depressive disorder (HCC)  Continue- buPROPion (WELLBUTRIN XL) 150 MG 24 hr tablet; Take 1 tablet (150 mg total) by mouth every morning.  Dispense: 90 tablet; Refill: 3 Continue- traZODone (DESYREL) 50 MG tablet; Take 1-2 tablets (50-100 mg total) by mouth at bedtime as needed for sleep.  Dispense: 60 tablet; Refill: 3 Restart- busPIRone (BUSPAR) 10 MG tablet; Take 1 tablet (10 mg total) by mouth 3 (three) times daily.  Dispense: 90 tablet; Refill: 3 Continue- buPROPion (WELLBUTRIN XL) 300 MG 24 hr tablet; Take 1 tablet (300 mg total) by mouth every morning.  Dispense: 90 tablet; Refill: 3  2. Generalized anxiety disorder  Continue- traZODone (DESYREL) 50 MG tablet; Take 1-2 tablets (50-100 mg total) by mouth at bedtime as needed for sleep.  Dispense: 60 tablet; Refill: 3 Continue- busPIRone (BUSPAR) 10 MG tablet; Take 1 tablet (10 mg total) by mouth 3 (three) times daily.  Dispense: 90 tablet; Refill: 3  Follow up in 3 months Follow up with therapy.   Salley Slaughter, NP 03/06/2022, 10:17 AM

## 2022-03-11 ENCOUNTER — Other Ambulatory Visit: Payer: Self-pay

## 2022-03-11 ENCOUNTER — Encounter: Payer: Self-pay | Admitting: *Deleted

## 2022-03-11 ENCOUNTER — Other Ambulatory Visit: Payer: Self-pay | Admitting: Family Medicine

## 2022-03-11 DIAGNOSIS — I10 Essential (primary) hypertension: Secondary | ICD-10-CM

## 2022-03-11 DIAGNOSIS — E785 Hyperlipidemia, unspecified: Secondary | ICD-10-CM

## 2022-03-12 ENCOUNTER — Other Ambulatory Visit (HOSPITAL_COMMUNITY): Payer: Self-pay

## 2022-03-12 ENCOUNTER — Other Ambulatory Visit: Payer: Self-pay

## 2022-03-18 ENCOUNTER — Other Ambulatory Visit: Payer: Self-pay

## 2022-03-18 MED ORDER — ATORVASTATIN CALCIUM 20 MG PO TABS
20.0000 mg | ORAL_TABLET | Freq: Every day | ORAL | 0 refills | Status: DC
  Filled 2022-03-18: qty 30, 30d supply, fill #0

## 2022-03-18 MED ORDER — HYDROCHLOROTHIAZIDE 25 MG PO TABS
25.0000 mg | ORAL_TABLET | Freq: Every day | ORAL | 0 refills | Status: DC
  Filled 2022-03-18: qty 30, 30d supply, fill #0

## 2022-03-18 NOTE — Telephone Encounter (Signed)
Pt called to make an appt for the first available which was Aug 2.  She wants to know if she can get a refil before then  she is out.  [facility]

## 2022-03-18 NOTE — Telephone Encounter (Signed)
Courtesy refill given, appointment needed.    Greggory Keen D 21 minutes ago (3:37 PM)   TP Pt called to make an appt for the first available which was Aug 2.  She wants to know if she can get a refil before then  she is out.   [facility]

## 2022-03-18 NOTE — Addendum Note (Signed)
Addended by: Matilde Sprang on: 03/18/2022 03:58 PM   Modules accepted: Orders

## 2022-04-17 ENCOUNTER — Other Ambulatory Visit: Payer: Self-pay

## 2022-04-17 ENCOUNTER — Encounter: Payer: Self-pay | Admitting: Physician Assistant

## 2022-04-17 ENCOUNTER — Ambulatory Visit: Payer: BC Managed Care – PPO | Attending: Physician Assistant | Admitting: Physician Assistant

## 2022-04-17 VITALS — BP 124/86 | HR 80 | Ht 70.0 in | Wt 196.8 lb

## 2022-04-17 DIAGNOSIS — R7303 Prediabetes: Secondary | ICD-10-CM

## 2022-04-17 DIAGNOSIS — I10 Essential (primary) hypertension: Secondary | ICD-10-CM | POA: Diagnosis not present

## 2022-04-17 DIAGNOSIS — E785 Hyperlipidemia, unspecified: Secondary | ICD-10-CM | POA: Diagnosis not present

## 2022-04-17 MED ORDER — HYDROCHLOROTHIAZIDE 25 MG PO TABS
25.0000 mg | ORAL_TABLET | Freq: Every day | ORAL | 1 refills | Status: DC
  Filled 2022-04-17: qty 30, 30d supply, fill #0
  Filled 2022-04-26: qty 30, 30d supply, fill #1
  Filled 2022-04-29: qty 30, 30d supply, fill #0
  Filled 2022-04-29 – 2022-06-06 (×2): qty 30, 30d supply, fill #1
  Filled 2022-07-05: qty 30, 30d supply, fill #2
  Filled 2022-08-29 – 2022-09-05 (×2): qty 30, 30d supply, fill #3
  Filled 2022-09-30 – 2022-10-03 (×2): qty 30, 30d supply, fill #4
  Filled 2022-11-07: qty 30, 30d supply, fill #5

## 2022-04-17 MED ORDER — ATORVASTATIN CALCIUM 20 MG PO TABS
20.0000 mg | ORAL_TABLET | Freq: Every day | ORAL | 1 refills | Status: DC
  Filled 2022-04-17: qty 30, 30d supply, fill #0
  Filled 2022-04-26: qty 30, 30d supply, fill #1
  Filled 2022-04-29: qty 30, 30d supply, fill #0
  Filled 2022-04-29 – 2022-06-06 (×2): qty 30, 30d supply, fill #1
  Filled 2022-07-05: qty 30, 30d supply, fill #2
  Filled 2022-08-29 – 2022-09-05 (×2): qty 30, 30d supply, fill #3
  Filled 2022-09-30 – 2022-10-03 (×2): qty 30, 30d supply, fill #4
  Filled 2022-11-07: qty 30, 30d supply, fill #5

## 2022-04-17 NOTE — Progress Notes (Signed)
Patient ID: Alicia Robinson, female   DOB: 10-Aug-1976, 46 y.o.   MRN: [id number]   Alicia Robinson, is a 46 y.o. female  RXV:[id number]  [id number]  DOB - 1976/04/20  Chief Complaint  Patient presents with   Medication Refill       Subjective:   Alicia Robinson is a 46 y.o. female here today for med RF.  No issues or concerns.  No HA/CP/SOB/dizziness   No problems updated.  ALLERGIES: Allergies  Allergen Reactions   Nsaids     Renal insufficiency- no per pt    PAST MEDICAL HISTORY: Past Medical History:  Diagnosis Date   Anxiety    Depression    Hyperlipidemia    Hypertension    Stroke ([place]) 2016   "mild"   Suicidal overdose ([place])     MEDICATIONS AT HOME: Prior to Admission medications   Medication Sig Start Date End Date Taking? Authorizing Provider  buPROPion (WELLBUTRIN XL) 150 MG 24 hr tablet Take 1 tablet (150 mg total) by mouth every morning. 03/06/22  Yes Eulis Canner E, NP  buPROPion (WELLBUTRIN XL) 300 MG 24 hr tablet Take 1 tablet (300 mg total) by mouth every morning. 03/06/22  Yes Eulis Canner E, NP  busPIRone (BUSPAR) 10 MG tablet Take 1 tablet (10 mg total) by mouth 3 (three) times daily. 03/06/22  Yes Salley Slaughter, NP  omeprazole (PRILOSEC) 20 MG capsule Take 1 capsule (20 mg total) by mouth 2 (two) times daily before a meal. Patient taking differently: Take 20 mg by mouth 2 (two) times daily before a meal. Takes PRN 02/28/20  Yes Minette Brine, Amy J, NP  traZODone (DESYREL) 50 MG tablet Take 1-2 tablets (50-100 mg total) by mouth at bedtime as needed for sleep. 03/06/22 03/06/23 Yes Eulis Canner E, NP  Ascorbic Acid (VITAMIN C PO) Take by mouth daily. Patient not taking: Reported on 04/17/2022    [provider]  atorvastatin (LIPITOR) 20 MG tablet Take 1 tablet (20 mg total) by mouth daily. 04/17/22   Argentina Donovan, PA-C  hydrochlorothiazide (HYDRODIURIL) 25 MG tablet Take 1 tablet (25 mg total) by mouth daily. 04/17/22   Argentina Donovan, PA-C  Turmeric (QC TUMERIC COMPLEX PO) Take by mouth. Patient not taking: Reported on 04/17/2022    [provider]    ROS: Neg HEENT Neg resp Neg cardiac Neg GI Neg GU Neg MS Neg psych Neg neuro  Objective:   Vitals:   04/17/22 0920  BP: 124/86  Pulse: 80  SpO2: 96%  Weight: 196 lb 12.8 oz (89.3 kg)  Height: '5\' 10"'$  (1.778 m)   Exam General appearance : Awake, alert, not in any distress. Speech Clear. Not toxic looking HEENT: Atraumatic and Normocephalic Neck: Supple, no JVD. No cervical lymphadenopathy.  Chest: Good air entry bilaterally, CTAB.  No rales/rhonchi/wheezing CVS: S1 S2 regular, no murmurs.  Extremities: B/L Lower Ext shows no edema, both legs are warm to touch Neurology: Awake alert, and oriented X 3, CN II-XII intact, Non focal Skin: No Rash  Data Review Lab Results  Component Value Date   HGBA1C 5.7 (H) 03/10/2020   HGBA1C 5.4 11/19/2018   HGBA1C 5.3 09/22/2017    Assessment & Plan   1. Hyperlipidemia, unspecified hyperlipidemia type - atorvastatin (LIPITOR) 20 MG tablet; Take 1 tablet (20 mg total) by mouth daily.  Dispense: 90 tablet; Refill: 1 - Lipid panel  2. Essential hypertension - hydrochlorothiazide (HYDRODIURIL) 25 MG tablet; Take 1 tablet (25 mg total) by  mouth daily.  Dispense: 90 tablet; Refill: 1 - Comprehensive metabolic panel  3.  A1C in 2021=5.7 A1C ordered    Return in about 6 months (around 10/18/2022) for needs to be assigned new PCP here and to have CPE at that appt..  The patient was given clear instructions to go to ER or return to medical center if symptoms don't improve, worsen or new problems develop. The patient verbalized understanding. The patient was told to call to get lab results if they haven't heard anything in the next week.      Freeman Caldron, PA-C [facility] and [facility] [place], [place]   04/17/2022, 9:53 AM

## 2022-04-18 LAB — COMPREHENSIVE METABOLIC PANEL
ALT: 35 IU/L — ABNORMAL HIGH (ref 0–32)
AST: 29 IU/L (ref 0–40)
Albumin/Globulin Ratio: 2.1 (ref 1.2–2.2)
Albumin: 4.6 g/dL (ref 3.9–4.9)
Alkaline Phosphatase: 71 IU/L (ref 44–121)
BUN/Creatinine Ratio: 13 (ref 9–23)
BUN: 14 mg/dL (ref 6–24)
Bilirubin Total: 0.3 mg/dL (ref 0.0–1.2)
CO2: 25 mmol/L (ref 20–29)
Calcium: 9.4 mg/dL (ref 8.7–10.2)
Chloride: 102 mmol/L (ref 96–106)
Creatinine, Ser: 1.12 mg/dL — ABNORMAL HIGH (ref 0.57–1.00)
Globulin, Total: 2.2 g/dL (ref 1.5–4.5)
Glucose: 96 mg/dL (ref 70–99)
Potassium: 4.3 mmol/L (ref 3.5–5.2)
Sodium: 142 mmol/L (ref 134–144)
Total Protein: 6.8 g/dL (ref 6.0–8.5)
eGFR: 61 mL/min/{1.73_m2} (ref >59)

## 2022-04-18 LAB — LIPID PANEL
Chol/HDL Ratio: 3.7 ratio (ref 0.0–4.4)
Cholesterol, Total: 172 mg/dL (ref 100–199)
HDL: 46 mg/dL (ref >39)
LDL Chol Calc (NIH): 107 mg/dL — ABNORMAL HIGH (ref 0–99)
Triglycerides: 106 mg/dL (ref 0–149)
VLDL Cholesterol Cal: 19 mg/dL (ref 5–40)

## 2022-04-24 ENCOUNTER — Other Ambulatory Visit: Payer: Self-pay

## 2022-04-25 ENCOUNTER — Other Ambulatory Visit: Payer: Self-pay

## 2022-04-26 ENCOUNTER — Other Ambulatory Visit: Payer: Self-pay

## 2022-04-29 ENCOUNTER — Other Ambulatory Visit (HOSPITAL_COMMUNITY): Payer: Self-pay

## 2022-04-29 ENCOUNTER — Other Ambulatory Visit: Payer: Self-pay

## 2022-06-06 ENCOUNTER — Encounter (INDEPENDENT_AMBULATORY_CARE_PROVIDER_SITE_OTHER): Payer: Self-pay

## 2022-06-06 ENCOUNTER — Other Ambulatory Visit: Payer: Self-pay

## 2022-06-06 ENCOUNTER — Telehealth (INDEPENDENT_AMBULATORY_CARE_PROVIDER_SITE_OTHER): Payer: BC Managed Care – PPO | Admitting: Psychiatry

## 2022-06-06 ENCOUNTER — Encounter (HOSPITAL_COMMUNITY): Payer: Self-pay | Admitting: Psychiatry

## 2022-06-06 DIAGNOSIS — F32A Depression, unspecified: Secondary | ICD-10-CM | POA: Diagnosis not present

## 2022-06-06 DIAGNOSIS — F411 Generalized anxiety disorder: Secondary | ICD-10-CM | POA: Diagnosis not present

## 2022-06-06 MED ORDER — BUSPIRONE HCL 10 MG PO TABS
10.0000 mg | ORAL_TABLET | Freq: Three times a day (TID) | ORAL | 3 refills | Status: DC
  Filled 2022-06-06: qty 90, 30d supply, fill #0

## 2022-06-06 MED ORDER — TRAZODONE HCL 50 MG PO TABS
50.0000 mg | ORAL_TABLET | Freq: Every evening | ORAL | 3 refills | Status: DC | PRN
  Filled 2022-06-06 – 2022-06-17 (×2): qty 60, 30d supply, fill #0
  Filled 2022-07-05: qty 60, 30d supply, fill #1

## 2022-06-06 MED ORDER — ARIPIPRAZOLE 5 MG PO TABS
5.0000 mg | ORAL_TABLET | Freq: Every day | ORAL | 3 refills | Status: DC
  Filled 2022-06-06: qty 30, 30d supply, fill #0
  Filled 2022-07-05: qty 30, 30d supply, fill #1

## 2022-06-06 MED ORDER — HYDROXYZINE HCL 10 MG PO TABS
10.0000 mg | ORAL_TABLET | Freq: Three times a day (TID) | ORAL | 3 refills | Status: DC | PRN
  Filled 2022-06-06: qty 90, 30d supply, fill #0

## 2022-06-06 MED ORDER — BUPROPION HCL ER (XL) 300 MG PO TB24
300.0000 mg | ORAL_TABLET | ORAL | 3 refills | Status: DC
  Filled 2022-06-06 – 2022-06-24 (×2): qty 30, 30d supply, fill #0
  Filled 2022-07-29: qty 30, 30d supply, fill #1
  Filled 2022-08-29: qty 30, 30d supply, fill #2

## 2022-06-06 MED ORDER — BUPROPION HCL ER (XL) 150 MG PO TB24
150.0000 mg | ORAL_TABLET | ORAL | 3 refills | Status: DC
  Filled 2022-06-06 – 2022-06-24 (×2): qty 30, 30d supply, fill #0
  Filled 2022-07-29: qty 30, 30d supply, fill #1
  Filled 2022-08-29: qty 30, 30d supply, fill #2

## 2022-06-06 NOTE — Progress Notes (Signed)
BH MD/PA/NP OP Progress Note Virtual Visit via Video Note  I connected with Alicia Robinson on 06/06/22 at  9:30 AM EDT by a video enabled telemedicine application and verified that I am speaking with the correct person using two identifiers.  Location: Patient: Home Provider: Clinic   I discussed the limitations of evaluation and management by telemedicine and the availability of in person appointments. The patient expressed understanding and agreed to proceed.  I provided 30 minutes of non-face-to-face time during this encounter.        06/06/2022 9:55 AM Alicia Robinson  MRN:  [id number]  Chief Complaint: "I'm going through a lot"  HPI: 45 year old female seen today for follow up of psychiatric evaluation. She has a psychiatric history of depression, anxiety, SI. She is currently managed on Wellbutrin 450 mg XL, Busbar 10 mg three times daily, and Trazodone 50-100 mg nightly. She notes that medications are somewhat effective in managing her psychiatric conditions.  Today she was well-groomed, pleasant, cooperative, and engaged in conversation.  She informed Probation officer that she has a lot going on.  She notes that she continues to live with her son and is looking for herself an apartment.  She also informed Probation officer that her job is stressful.  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  Recently she notes that she has been more depressed and unable to concentrate.  Provider conducted a PHQ-9 and patient scored a 19, at her last visit she scored a 17.  Provider also conducted a GAD-7 and patient scored a 14, at her last visit she scored a 15.  She endorses poor appetite and adequate sleep.  Today she denies SI/HI/AVH, mania, or paranoia.    Patient informed Probation officer that she would like to use her dog [place] as a Tree surgeon.  She request that Probation officer write a letter for this.  Provider informed patient that a letter was written a year ago.  She notes that she needs a current one.  Provider was  agreeable to this.  Today she is agreeable to starting Abilify 5 mg to help manage depression.  She is also agreeable to starting hydroxyzine 10 mg 3 times daily to help manage anxiety.Potential side effects of medication and risks vs benefits of treatment vs non-treatment were explained and discussed. All questions were answered.  She will continue all other medications as prescribed and follow-up with outpatient counseling for therapy.  No other concerns at this time.      Visit Diagnosis:    ICD-10-CM   1. Moderately severe depression  F32.A ARIPiprazole (ABILIFY) 5 MG tablet    buPROPion (WELLBUTRIN XL) 150 MG 24 hr tablet    busPIRone (BUSPAR) 10 MG tablet    traZODone (DESYREL) 50 MG tablet    2. Generalized anxiety disorder  F41.1 hydrOXYzine (ATARAX) 10 MG tablet    busPIRone (BUSPAR) 10 MG tablet    traZODone (DESYREL) 50 MG tablet      Past Psychiatric History: depression, anxiety, and suicidal ideation.  Past Medical History:  Past Medical History:  Diagnosis Date   Anxiety    Depression    Hyperlipidemia    Hypertension    Stroke ([place]) 2016   "mild"   Suicidal overdose ([place])     Past Surgical History:  Procedure Laterality Date   CHOLECYSTECTOMY     SIGMOIDOSCOPY     pt is unsure about this   TUBAL LIGATION     UPPER GASTROINTESTINAL ENDOSCOPY  Family Psychiatric History: Mother depression  Family History:  Family History  Problem Relation Age of Onset   Hypertension Mother    Diabetes Mother    Hypertension Father    Diabetes Father    Diabetes Maternal Grandmother    Hypertension Maternal Grandmother    Colon cancer Neg Hx    Esophageal cancer Neg Hx    Rectal cancer Neg Hx    Stomach cancer Neg Hx     Social History:  Social History   Socioeconomic History   Marital status: Single    Spouse name: Not on file   Number of children: 3   Years of education: Not on file   Highest education level: Associate degree: occupational, Hotel manager,  or vocational program  Occupational History   Not on file  Tobacco Use   Smoking status: Never   Smokeless tobacco: Never  Vaping Use   Vaping Use: Never used  Substance and Sexual Activity   Alcohol use: Yes    Comment: weekends, socially   Drug use: Not Currently    Types: Marijuana   Sexual activity: Not Currently  Other Topics Concern   Not on file  Social History Narrative   Not on file   Social Determinants of Health   Financial Resource Strain: Not on file  Food Insecurity: Not on file  Transportation Needs: No Transportation Needs (06/13/2020)   PRAPARE - Hydrologist (Medical): No    Lack of Transportation (Non-Medical): No  Physical Activity: Not on file  Stress: Not on file  Social Connections: Not on file    Allergies:  Allergies  Allergen Reactions   Nsaids     Renal insufficiency- no per pt    Metabolic Disorder Labs: Lab Results  Component Value Date   HGBA1C 5.7 (H) 03/10/2020   MPG 108.28 11/19/2018   MPG 105.41 09/22/2017   No results found for: "PROLACTIN" Lab Results  Component Value Date   CHOL 172 04/17/2022   TRIG 106 04/17/2022   HDL 46 04/17/2022   CHOLHDL 3.7 04/17/2022   VLDL 16 11/19/2018   LDLCALC 107 (H) 04/17/2022   LDLCALC 120 (H) 07/03/2021   Lab Results  Component Value Date   TSH 1.000 03/10/2020   TSH 1.578 11/19/2018    Therapeutic Level Labs: No results found for: "LITHIUM" No results found for: "VALPROATE" No results found for: "CBMZ"  Current Medications: Current Outpatient Medications  Medication Sig Dispense Refill   ARIPiprazole (ABILIFY) 5 MG tablet Take 1 tablet (5 mg total) by mouth daily. 30 tablet 3   hydrOXYzine (ATARAX) 10 MG tablet Take 1 tablet (10 mg total) by mouth 3 (three) times daily as needed. 90 tablet 3   Ascorbic Acid (VITAMIN C PO) Take by mouth daily. (Patient not taking: Reported on 04/17/2022)     atorvastatin (LIPITOR) 20 MG tablet Take 1 tablet (20 mg  total) by mouth daily. 90 tablet 1   buPROPion (WELLBUTRIN XL) 150 MG 24 hr tablet Take 1 tablet (150 mg total) by mouth every morning. 90 tablet 3   buPROPion (WELLBUTRIN XL) 300 MG 24 hr tablet Take 1 tablet (300 mg total) by mouth every morning. 90 tablet 3   busPIRone (BUSPAR) 10 MG tablet Take 1 tablet (10 mg total) by mouth 3 (three) times daily. 90 tablet 3   hydrochlorothiazide (HYDRODIURIL) 25 MG tablet Take 1 tablet (25 mg total) by mouth daily. 90 tablet 1   omeprazole (PRILOSEC) 20 MG capsule  Take 1 capsule (20 mg total) by mouth 2 (two) times daily before a meal. (Patient taking differently: Take 20 mg by mouth 2 (two) times daily before a meal. Takes PRN) 180 capsule 0   traZODone (DESYREL) 50 MG tablet Take 1-2 tablets (50-100 mg total) by mouth at bedtime as needed for sleep. 60 tablet 3   Turmeric (QC TUMERIC COMPLEX PO) Take by mouth. (Patient not taking: Reported on 04/17/2022)     No current facility-administered medications for this visit.     Musculoskeletal: Strength & Muscle Tone: within normal limits and telehealth visit Gait & Station: normal, telehealth visit Patient leans: N/A  Psychiatric Specialty Exam: Review of Systems  There were no vitals taken for this visit.There is no height or weight on file to calculate BMI.  General Appearance: Well Groomed  Eye Contact:  Good  Speech:  Clear and Coherent and Normal Rate  Volume:  Normal  Mood:  Anxious and Depressed  Affect:  Appropriate and Congruent  Thought Process:  Coherent, Goal Directed and Linear  Orientation:  Full (Time, Place, and Person)  Thought Content: WDL and Logical   Suicidal Thoughts:  No  Homicidal Thoughts:  No  Memory:  Immediate;   Good Recent;   Good Remote;   Good  Judgement:  Good  Insight:  Good  Psychomotor Activity:  Normal  Concentration:  Concentration: Good and Attention Span: Good  Recall:  Good  Fund of Knowledge: Good  Language: Good  Akathisia:  No  Handed:  Right   AIMS (if indicated): Not done  Assets:  Communication Skills Desire for Improvement Financial Resources/Insurance Housing Social Support  ADL's:  Intact  Cognition: WNL  Sleep:  Good   Screenings: AIMS    Flowsheet Row Admission (Discharged) from 11/19/2018 in [facility] Admission (Discharged) from 09/21/2017 in [facility] Admission (Discharged) from OP Visit from 07/11/2017 in [facility]  AIMS Total Score 0 0 0      AUDIT    Flowsheet Row Admission (Discharged) from 11/19/2018 in [facility] Admission (Discharged) from OP Visit from 07/11/2017 in [facility]  Alcohol Use Disorder Identification Test Final Score (AUDIT) 0 1      GAD-7    Flowsheet Row Video Visit from 06/06/2022 in [facility] Office Visit from 04/17/2022 in [facility] Video Visit from 03/06/2022 in [facility] Video Visit from 09/28/2021 in [facility] Visit from 06/28/20[facility]  Total GAD-7 Score '14 15 16 7 16      '$ PHQ2-9    Flowsheet Row Video Visit from 06/06/2022 in [facility] Office Visit from 04/17/2022 in [place] Video Visit from 03/06/2022 in [facility] Video Visit from 09/28/2021 in [facility] Video Visit from 06/28/2021 in [place]  PHQ-2 Total Score '6 5 5 1 4  '$ PHQ-9 Total Score '19 17 19 3 15      '$ Flowsheet Row Video Visit from 03/06/2022 in [facility] Video Visit from 12/27/2020 in [facility] Admission (Discharged) from 11/19/2018 in [facility] [place]  [facility]  C-SSRS RISK CATEGORY Error: Q7 should not be populated when Q6 is No No Risk Low Risk  Assessment and Plan: Patient endorses symptoms of anxiety and depression.Today she is agreeable to starting Abilify 5 mg to help manage depression.  She is also agreeable to starting hydroxyzine 10 mg 3 times daily to help manage anxiety.  She will continue all other medications as prescribed  1. Generalized anxiety disorder  Start- hydrOXYzine (ATARAX) 10 MG tablet; Take 1 tablet (10 mg total) by mouth 3 (three) times daily as needed.  Dispense: 90 tablet; Refill: 3 Continue- busPIRone (BUSPAR) 10 MG tablet; Take 1 tablet (10 mg total) by mouth 3 (three) times daily.  Dispense: 90 tablet; Refill: 3 Continue- traZODone (DESYREL) 50 MG tablet; Take 1-2 tablets (50-100 mg total) by mouth at bedtime as needed for sleep.  Dispense: 60 tablet; Refill: 3  2. Moderately severe depression  Start- ARIPiprazole (ABILIFY) 5 MG tablet; Take 1 tablet (5 mg total) by mouth daily.  Dispense: 30 tablet; Refill: 3 Continue- buPROPion (WELLBUTRIN XL) 150 MG 24 hr tablet; Take 1 tablet (150 mg total) by mouth every morning.  Dispense: 90 tablet; Refill: 3 Continue- busPIRone (BUSPAR) 10 MG tablet; Take 1 tablet (10 mg total) by mouth 3 (three) times daily.  Dispense: 90 tablet; Refill: 3 Continue- traZODone (DESYREL) 50 MG tablet; Take 1-2 tablets (50-100 mg total) by mouth at bedtime as needed for sleep.  Dispense: 60 tablet; Refill: 3   Follow up in 3 months Follow up with therapy.   Salley Slaughter, NP 06/06/2022, 9:55 AM

## 2022-06-17 ENCOUNTER — Other Ambulatory Visit: Payer: Self-pay

## 2022-06-18 ENCOUNTER — Other Ambulatory Visit: Payer: Self-pay

## 2022-06-20 ENCOUNTER — Other Ambulatory Visit: Payer: Self-pay

## 2022-06-23 ENCOUNTER — Emergency Department (HOSPITAL_COMMUNITY)
Admission: EM | Admit: 2022-06-23 | Discharge: 2022-06-23 | Disposition: A | Discharge status: Home / Self Care | Payer: BC Managed Care – PPO | Attending: Emergency Medicine | Admitting: Emergency Medicine

## 2022-06-23 ENCOUNTER — Emergency Department (HOSPITAL_COMMUNITY): Payer: BC Managed Care – PPO

## 2022-06-23 ENCOUNTER — Other Ambulatory Visit: Payer: Self-pay

## 2022-06-23 ENCOUNTER — Encounter (HOSPITAL_COMMUNITY): Payer: Self-pay | Admitting: Emergency Medicine

## 2022-06-23 DIAGNOSIS — Z79899 Other long term (current) drug therapy: Secondary | ICD-10-CM | POA: Diagnosis not present

## 2022-06-23 DIAGNOSIS — M545 Low back pain, unspecified: Secondary | ICD-10-CM | POA: Diagnosis not present

## 2022-06-23 DIAGNOSIS — R0789 Other chest pain: Secondary | ICD-10-CM | POA: Insufficient documentation

## 2022-06-23 DIAGNOSIS — G8929 Other chronic pain: Secondary | ICD-10-CM

## 2022-06-23 LAB — BASIC METABOLIC PANEL
Anion gap: 4 — ABNORMAL LOW (ref 5–15)
BUN: 18 mg/dL (ref 6–20)
CO2: 25 mmol/L (ref 22–32)
Calcium: 8.6 mg/dL — ABNORMAL LOW (ref 8.9–10.3)
Chloride: 110 mmol/L (ref 98–111)
Creatinine, Ser: 1.19 mg/dL — ABNORMAL HIGH (ref 0.44–1.00)
GFR, Estimated: 57 mL/min — ABNORMAL LOW (ref >60)
Glucose, Bld: 91 mg/dL (ref 70–99)
Potassium: 4.2 mmol/L (ref 3.5–5.1)
Sodium: 139 mmol/L (ref 135–145)

## 2022-06-23 LAB — CBC
HCT: 37.1 % (ref 36.0–46.0)
Hemoglobin: 12.4 g/dL (ref 12.0–15.0)
MCH: 29.9 pg (ref 26.0–34.0)
MCHC: 33.4 g/dL (ref 30.0–36.0)
MCV: 89.4 fL (ref 80.0–100.0)
Platelets: 527 10*3/uL — ABNORMAL HIGH (ref 150–400)
RBC: 4.15 MIL/uL (ref 3.87–5.11)
RDW: 16.8 % — ABNORMAL HIGH (ref 11.5–15.5)
WBC: 12.4 10*3/uL — ABNORMAL HIGH (ref 4.0–10.5)
nRBC: 0 % (ref 0.0–0.2)

## 2022-06-23 LAB — TROPONIN I (HIGH SENSITIVITY)
Troponin I (High Sensitivity): 3 ng/L (ref <18)
Troponin I (High Sensitivity): 5 ng/L (ref <18)

## 2022-06-23 MED ORDER — METHYL SALICYLATE-LIDO-MENTHOL 4-4-5 % EX PTCH
1.0000 | MEDICATED_PATCH | Freq: Two times a day (BID) | CUTANEOUS | 0 refills | Status: DC
  Filled 2022-06-23 – 2022-07-29 (×2): qty 30, fill #0

## 2022-06-23 MED ORDER — ACETAMINOPHEN 500 MG PO TABS
1000.0000 mg | ORAL_TABLET | Freq: Four times a day (QID) | ORAL | Status: DC | PRN
  Administered 2022-06-23: 1000 mg via ORAL
  Filled 2022-06-23: qty 2

## 2022-06-23 MED ORDER — PREDNISONE 20 MG PO TABS
40.0000 mg | ORAL_TABLET | Freq: Every day | ORAL | 0 refills | Status: DC
  Filled 2022-06-23: qty 8, 4d supply, fill #0

## 2022-06-23 NOTE — Discharge Instructions (Signed)
Generally reassuring.  There is no evidence for heart attack, pneumonia, or other acute causes for your chest pain.  There is evidence for arthritis and changes in your lumbar spine.  Please use the medication as prescribed, and follow-up with your physician as well as with our orthopedic colleagues for appropriate ongoing outpatient care.  Return here for concerning changes in your condition.

## 2022-06-23 NOTE — ED Provider Notes (Signed)
[facility] EMERGENCY DEPARTMENT Provider Note   CSN: [id number] Arrival date & time: 06/23/22  1357     History  No chief complaint on file.   Alicia Robinson is a 46 y.o. female.  HPI Patient presents with concern of chest pain and low back pain.  She notes that she has had back pain for some time, left lower back, without radiation, without abdominal pain, without incontinence.  Yesterday she developed chest pain, this is been present for about 24 hours, sternal.    Home Medications Prior to Admission medications   Medication Sig Start Date End Date Taking? Authorizing Provider  ARIPiprazole (ABILIFY) 5 MG tablet Take 1 tablet (5 mg total) by mouth daily. 06/06/22  Yes Eulis Canner E, NP  ascorbic acid (VITAMIN C) 500 MG tablet Take 500 mg by mouth daily.   Yes [provider]  atorvastatin (LIPITOR) 20 MG tablet Take 1 tablet (20 mg total) by mouth daily. 04/17/22  Yes McClung, Angela M, PA-C  buPROPion (WELLBUTRIN XL) 150 MG 24 hr tablet Take 1 tablet (150 mg total) by mouth every morning. Patient taking differently: Take 150 mg by mouth every morning. Take along with 300 mg tablet=450 mg 06/06/22  Yes Eulis Canner E, NP  buPROPion (WELLBUTRIN XL) 300 MG 24 hr tablet Take 1 tablet (300 mg total) by mouth every morning. Patient taking differently: Take 300 mg by mouth every morning. Take along with 300 mg tablet=450 mg 06/06/22  Yes Eulis Canner E, NP  busPIRone (BUSPAR) 10 MG tablet Take 1 tablet (10 mg total) by mouth 3 (three) times daily. Patient taking differently: Take 10 mg by mouth 3 (three) times daily as needed (anxiety). 06/06/22  Yes Eulis Canner E, NP  hydrochlorothiazide (HYDRODIURIL) 25 MG tablet Take 1 tablet (25 mg total) by mouth daily. 04/17/22  Yes Argentina Donovan, PA-C  hydrOXYzine (ATARAX) 10 MG tablet Take 1 tablet (10 mg total) by mouth 3 (three) times daily as needed. Patient taking differently: Take 10 mg by mouth 3  (three) times daily as needed for anxiety. 06/06/22  Yes Eulis Canner E, NP  Methyl Salicylate-Lido-Menthol 4-4-5 % PTCH Apply 1 patch topically in the morning and at bedtime. 06/23/22  Yes Carmin Muskrat, MD  omeprazole (PRILOSEC) 20 MG capsule Take 1 capsule (20 mg total) by mouth 2 (two) times daily before a meal. Patient taking differently: Take 20 mg by mouth daily. 02/28/20  Yes Minette Brine, Amy J, NP  predniSONE (DELTASONE) 20 MG tablet Take 2 tablets (40 mg total) by mouth daily with breakfast. For the next four days 06/23/22  Yes Carmin Muskrat, MD  traZODone (DESYREL) 50 MG tablet Take 1-2 tablets (50-100 mg total) by mouth at bedtime as needed for sleep. 06/06/22 06/06/23 Yes Salley Slaughter, NP      Allergies    Nsaids    Review of Systems   Review of Systems  All other systems reviewed and are negative.   Physical Exam Updated Vital Signs BP (!) 139/97   Pulse 76   Temp 98.4 F (36.9 C) (Oral)   Resp 20   Ht '5\' 10"'$  (1.778 m)   Wt 89 kg   SpO2 100%   BMI 28.15 kg/m  Physical Exam Vitals and nursing note reviewed.  Constitutional:      General: She is not in acute distress.    Appearance: She is well-developed.  HENT:     Head: Normocephalic and atraumatic.  Eyes:  Conjunctiva/sclera: Conjunctivae normal.  Cardiovascular:     Rate and Rhythm: Normal rate and regular rhythm.  Pulmonary:     Effort: Pulmonary effort is normal. No respiratory distress.     Breath sounds: Normal breath sounds. No stridor.  Abdominal:     General: There is no distension.  Skin:    General: Skin is warm and dry.  Neurological:     General: No focal deficit present.     Mental Status: She is alert and oriented to person, place, and time.     Cranial Nerves: No cranial nerve deficit or dysarthria.     Motor: No tremor or abnormal muscle tone.  Psychiatric:        Mood and Affect: Mood normal.     ED Results / Procedures / Treatments   Labs (all labs ordered are  listed, but only abnormal results are displayed) Labs Reviewed  BASIC METABOLIC PANEL - Abnormal; Notable for the following components:      Result Value   Creatinine, Ser 1.19 (*)    Calcium 8.6 (*)    GFR, Estimated 57 (*)    Anion gap 4 (*)    All other components within normal limits  CBC - Abnormal; Notable for the following components:   WBC 12.4 (*)    RDW 16.8 (*)    Platelets 527 (*)    All other components within normal limits  TROPONIN I (HIGH SENSITIVITY)  TROPONIN I (HIGH SENSITIVITY)    EKG EKG Interpretation  Date/Time:  Sunday June 23 2022 14:09:23 EDT Ventricular Rate:  82 PR Interval:  204 QRS Duration: 74 QT Interval:  336 QTC Calculation: 392 R Axis:   75 Text Interpretation: Normal sinus rhythm Nonspecific ST and T wave abnormality Abnormal ECG Confirmed by Carmin Muskrat [phone]) on 06/23/2022 7:45:58 PM  Radiology DG Lumbar Spine Complete  Result Date: 06/23/2022 CLINICAL DATA:  Chronic low back pain. EXAM: LUMBAR SPINE - COMPLETE 4+ VIEW COMPARISON:  None Available. FINDINGS: There is no evidence of lumbar spine fracture. Alignment is normal. Intervertebral disc spaces are maintained. Left-sided facet DJD is seen at L5-S1. No other osseous abnormality identified. IMPRESSION: No acute findings. 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 Electronically Signed   By: Marlaine Hind M.D.   On: 06/23/2022 15:38   DG Chest 2 View  Result Date: 06/23/2022 CLINICAL DATA:  Acute chest pain. EXAM: CHEST - 2 VIEW COMPARISON:  06/06/2020 and prior radiographs FINDINGS: The cardiomediastinal silhouette is unremarkable. Mild peribronchial thickening again noted. There is no evidence of focal airspace disease, pulmonary edema, suspicious pulmonary nodule/mass, pleural effusion, or pneumothorax. No acute bony abnormalities are identified. IMPRESSION: No active cardiopulmonary disease. Electronically Signed   By: Margarette Canada M.D.   On: 06/23/2022 14:56    Procedures Procedures     Medications Ordered in ED Medications  acetaminophen (TYLENOL) tablet 1,000 mg (1,000 mg Oral Given 06/23/22 2049)    ED Course/ Medical Decision Making/ A&P Clinical Course as of 06/23/22 2252  Sun Jun 23, 2022  2058 Chest pain last night, left side 7/10. Stabbing in nature. Improved with ibuprofen. Not worse with deep inspiration or exertion. No radiation. No fever, cough, syncope, numbness tingling. Also, onset of lower  back and L arm pain that began last week. Worse with sitting down, improves with standing. Back pain improved with a heating pad. No fevers, LE weakness or parasthesias, no urinary or fecal incontinence. L arm pain is above the left scapula.  [JN]  2112  CBC with mildly elevated WBC of 12.4,, chronic thrombocytosis, platelet count 527.  BMP unremarkable, no additional significant hematologic or metabolic abnormalities.  Initial troponin 3 [JN]  2113 Checks x-ray, lumbar spine x-ray unremarkable for acute pathology [JN]    Clinical Course User Index [JN] Nogle, [place], MD                           Medical Decision Making Amount and/or Complexity of Data Reviewed Labs: ordered. Decision-making details documented in ED Course. Radiology: ordered and independent interpretation performed. Decision-making details documented in ED Course. ECG/medicine tests:  Decision-making details documented in ED Course.  Risk OTC drugs. Prescription drug management.   Female presents with acute on chronic back pain without red flags suggesting nervous system involvement and with an x-ray which I reviewed at bedside with her demonstrating evidence for arthritis, suggesting musculoskeletal etiology.  Patient started on appropriate anti-inflammatory regimen, for will follow-up with orthopedics. Patient also is presenting with new chest pain, which she notes is a secondary concern, has no evidence for ACS with 2 normal troponin, nonischemic EKG, also reviewed with her at bedside.  Patient  does have medical issues including mild chronic kidney disease this is unchanged, has no evidence for pneumonia bacteremia, sepsis, is appropriate for close outpatient follow-up. Final Clinical Impression(s) / ED Diagnoses Final diagnoses:  Atypical chest pain  Chronic left-sided low back pain without sciatica    Rx / DC Orders ED Discharge Orders          Ordered    predniSONE (DELTASONE) 20 MG tablet  Daily with breakfast        06/23/22 4128    Methyl Salicylate-Lido-Menthol 4-4-5 % PTCH  2 times daily        06/23/22 2252              Carmin Muskrat, MD 06/23/22 2318

## 2022-06-23 NOTE — ED Triage Notes (Signed)
Pt here from home with c/o chest pain along with some back pain and left arm pain ,some nausea last night no sob

## 2022-06-24 ENCOUNTER — Other Ambulatory Visit: Payer: Self-pay

## 2022-06-26 ENCOUNTER — Other Ambulatory Visit: Payer: Self-pay

## 2022-06-26 MED ORDER — CYCLOBENZAPRINE HCL 10 MG PO TABS
10.0000 mg | ORAL_TABLET | Freq: Three times a day (TID) | ORAL | 0 refills | Status: DC
  Filled 2022-06-26: qty 90, 30d supply, fill #0

## 2022-07-05 ENCOUNTER — Other Ambulatory Visit (HOSPITAL_COMMUNITY): Payer: Self-pay

## 2022-07-08 ENCOUNTER — Other Ambulatory Visit: Payer: Self-pay

## 2022-07-08 ENCOUNTER — Other Ambulatory Visit (HOSPITAL_COMMUNITY): Payer: Self-pay

## 2022-07-10 ENCOUNTER — Encounter (HOSPITAL_COMMUNITY): Payer: Self-pay | Admitting: Pharmacist

## 2022-07-10 ENCOUNTER — Other Ambulatory Visit (HOSPITAL_COMMUNITY): Payer: Self-pay

## 2022-07-11 ENCOUNTER — Other Ambulatory Visit (HOSPITAL_COMMUNITY): Payer: Self-pay

## 2022-07-29 ENCOUNTER — Other Ambulatory Visit: Payer: Self-pay

## 2022-07-30 ENCOUNTER — Ambulatory Visit: Payer: BC Managed Care – PPO | Attending: Family Medicine | Admitting: Internal Medicine

## 2022-07-30 VITALS — BP 113/78 | HR 82 | Temp 98.5°F | Ht 70.0 in | Wt 192.0 lb

## 2022-07-30 DIAGNOSIS — N1831 Chronic kidney disease, stage 3a: Secondary | ICD-10-CM

## 2022-07-30 DIAGNOSIS — D75839 Thrombocytosis, unspecified: Secondary | ICD-10-CM

## 2022-07-30 DIAGNOSIS — D72829 Elevated white blood cell count, unspecified: Secondary | ICD-10-CM | POA: Diagnosis not present

## 2022-07-30 NOTE — Progress Notes (Signed)
Patient ID: Alicia Robinson, female    DOB: Dec 13, 1975  MRN: [id number]  CC: Follow-up (ER f/u. Alicia Robinson lab results. /No to flu vax.)   Subjective: Alicia Robinson is a 46 y.o. female who presents for ER f/u.  PCP is Dr. Margarita Robinson whom she last saw 06/2021. Her concerns today include:  Patient with history of HTN, HL, MDD, CVA, prediabetes  Patient seen in the emergency room 06/23/2022 with chest pain and left lower back pain without radiculopathy.  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 -In regards to her chest pain, troponin negative x2.  EKG without ischemic changes..  Patient presents today for follow-up from that ER visit wanting to discuss lab results that were done on that visit. She notes that her white blood cell and platelet counts have been elevated.  In looking through her chart it looks like these have been elevated at least for the past 2 years.  No history of blood clots or spontaneous bleeding.  She also notes that her calcium level was a little low.  Previously it was normal.  She notes that creatinine is elevated.  It was 1.19.  Looks like she has been 1.06-1.26 range for the past 2 years.  GFR was 57 with range being in the 50s to 60s over the past few years.  She is not on any NSAIDs.   Patient Active Problem List   Diagnosis Date Noted   PTSD (post-traumatic stress disorder) 06/28/2021   Generalized anxiety disorder 06/28/2021   Influenza vaccine refused 07/12/2020   Renal insufficiency 07/12/2020   Cannabis use disorder, mild, abuse 09/22/2017   MDD (major depressive disorder) 09/21/2017   Hyperlipidemia 07/14/2017   GERD (gastroesophageal reflux disease) 07/14/2017   HTN (hypertension) 07/13/2017   MDD (major depressive disorder), single episode, severe with psychosis ([place]) 07/11/2017     Current Outpatient Medications on File Prior to Visit  Medication Sig Dispense Refill   ARIPiprazole (ABILIFY) 5 MG tablet Take 1 tablet (5 mg total) by  mouth daily. 30 tablet 3   ascorbic acid (VITAMIN C) 500 MG tablet Take 500 mg by mouth daily.     atorvastatin (LIPITOR) 20 MG tablet Take 1 tablet (20 mg total) by mouth daily. 90 tablet 1   buPROPion (WELLBUTRIN XL) 150 MG 24 hr tablet Take 1 tablet (150 mg total) by mouth every morning. (Patient taking differently: Take 150 mg by mouth every morning. Take along with 300 mg tablet=450 mg) 90 tablet 3   buPROPion (WELLBUTRIN XL) 300 MG 24 hr tablet Take 1 tablet (300 mg total) by mouth every morning. (Patient taking differently: Take 300 mg by mouth every morning. Take along with 300 mg tablet=450 mg) 90 tablet 3   busPIRone (BUSPAR) 10 MG tablet Take 1 tablet (10 mg total) by mouth 3 (three) times daily. (Patient taking differently: Take 10 mg by mouth 3 (three) times daily as needed (anxiety).) 90 tablet 3   hydrochlorothiazide (HYDRODIURIL) 25 MG tablet Take 1 tablet (25 mg total) by mouth daily. 90 tablet 1   hydrOXYzine (ATARAX) 10 MG tablet Take 1 tablet (10 mg total) by mouth 3 (three) times daily as needed. (Patient taking differently: Take 10 mg by mouth 3 (three) times daily as needed for anxiety.) 90 tablet 3   omeprazole (PRILOSEC) 20 MG capsule Take 1 capsule (20 mg total) by mouth 2 (two) times daily before a meal. (Patient taking differently: Take 20 mg by mouth daily.) 180 capsule  0   traZODone (DESYREL) 50 MG tablet Take 1-2 tablets (50-100 mg total) by mouth at bedtime as needed for sleep. 60 tablet 3   cyclobenzaprine (FLEXERIL) 10 MG tablet Take 1 tablet (10 mg total) by mouth 3 (three) times dailyfor 30 days. (Patient not taking: Reported on 07/30/2022) 90 tablet 0   No current facility-administered medications on file prior to visit.    Allergies  Allergen Reactions   Nsaids     Renal insufficiency- no per pt    Social History   Socioeconomic History   Marital status: Single    Spouse name: Not on file   Number of children: 3   Years of education: Not on file    Highest education level: Associate degree: occupational, Hotel manager, or vocational program  Occupational History   Not on file  Tobacco Use   Smoking status: Never   Smokeless tobacco: Never  Vaping Use   Vaping Use: Never used  Substance and Sexual Activity   Alcohol use: Yes    Comment: weekends, socially   Drug use: Not Currently    Types: Marijuana   Sexual activity: Not Currently  Other Topics Concern   Not on file  Social History Narrative   Not on file   Social Determinants of Health   Financial Resource Strain: Not on file  Food Insecurity: Not on file  Transportation Needs: No Transportation Needs (06/13/2020)   PRAPARE - Hydrologist (Medical): No    Lack of Transportation (Non-Medical): No  Physical Activity: Not on file  Stress: Not on file  Social Connections: Not on file  Intimate Partner Violence: Not on file    Family History  Problem Relation Age of Onset   Hypertension Mother    Diabetes Mother    Hypertension Father    Diabetes Father    Diabetes Maternal Grandmother    Hypertension Maternal Grandmother    Colon cancer Neg Hx    Esophageal cancer Neg Hx    Rectal cancer Neg Hx    Stomach cancer Neg Hx     Past Surgical History:  Procedure Laterality Date   CHOLECYSTECTOMY     SIGMOIDOSCOPY     pt is unsure about this   TUBAL LIGATION     UPPER GASTROINTESTINAL ENDOSCOPY      ROS: Review of Systems Negative except as stated above  PHYSICAL EXAM: BP 113/78 (BP Location: Left Arm, Patient Position: Sitting, Cuff Size: Normal)   Pulse 82   Temp 98.5 F (36.9 C) (Oral)   Ht '5\' 10"'$  (1.778 m)   Wt 192 lb (87.1 kg)   SpO2 99%   BMI 27.55 kg/m   Physical Exam  General appearance - alert, well appearing, and in no distress Mental status - normal mood, behavior, speech, dress, motor activity, and thought processes Chest - clear to auscultation, no wheezes, rales or rhonchi, symmetric air entry Heart - normal  rate, regular rhythm, normal S1, S2, no murmurs, rubs, clicks or gallops      Latest Ref Rng & Units 06/23/2022    2:12 PM 04/17/2022    9:53 AM 07/03/2021    9:17 AM  CMP  Glucose 70 - 99 mg/dL 91  96  97   BUN 6 - 20 mg/dL '18  14  11   '$ Creatinine 0.44 - 1.00 mg/dL 1.19  1.12  1.26   Sodium 135 - 145 mmol/L 139  142  139   Potassium 3.5 - 5.1  mmol/L 4.2  4.3  4.3   Chloride 98 - 111 mmol/L 110  102  97   CO2 22 - 32 mmol/L '25  25  28   '$ Calcium 8.9 - 10.3 mg/dL 8.6  9.4  9.8   Total Protein 6.0 - 8.5 g/dL  6.8  7.2   Total Bilirubin 0.0 - 1.2 mg/dL  0.3  0.4   Alkaline Phos 44 - 121 IU/L  71  78   AST 0 - 40 IU/L  29  21   ALT 0 - 32 IU/L  35  25    Lipid Panel     Component Value Date/Time   CHOL 172 04/17/2022 0953   TRIG 106 04/17/2022 0953   HDL 46 04/17/2022 0953   CHOLHDL 3.7 04/17/2022 0953   CHOLHDL 3.3 11/19/2018 0633   VLDL 16 11/19/2018 0633   LDLCALC 107 (H) 04/17/2022 0953    CBC    Component Value Date/Time   WBC 12.4 (H) 06/23/2022 1412   RBC 4.15 06/23/2022 1412   HGB 12.4 06/23/2022 1412   HGB 12.8 03/10/2020 1013   HCT 37.1 06/23/2022 1412   HCT 39.6 03/10/2020 1013   PLT 527 (H) 06/23/2022 1412   MCV 89.4 06/23/2022 1412   MCV 88 03/10/2020 1013   MCH 29.9 06/23/2022 1412   MCHC 33.4 06/23/2022 1412   RDW 16.8 (H) 06/23/2022 1412   RDW 15.9 (H) 03/10/2020 1013   LYMPHSABS 3.5 (H) 03/10/2020 1013   MONOABS 1.1 (H) 11/18/2018 1600   EOSABS 0.3 03/10/2020 1013   BASOSABS 0.1 03/10/2020 1013    ASSESSMENT AND PLAN:  1. Thrombocytosis Since the thrombocytosis and leukocytosis have been chronic for the past several years, I recommend referral to hematology for further evaluation. - Ambulatory referral to Hematology / Oncology  2. Leukocytosis, unspecified type See #1 above - Ambulatory referral to Hematology / Oncology  3. Stage 3a chronic kidney disease ([place]) Advised to drink adequate fluids during the day. Advised to avoid NSAIDs.   Kidney function will need to be monitored over time.  4. Hypocalcemia May be due to low vitamin D level.  She is agreeable to having vitamin D level checked. - VITAMIN D 25 Hydroxy (Vit-D Deficiency, Fractures); Future    Patient was given the opportunity to ask questions.  Patient verbalized understanding of the plan and was able to repeat key elements of the plan.   This documentation was completed using Radio producer.  Any transcriptional errors are unintentional.  Orders Placed This Encounter  Procedures   VITAMIN D 25 Hydroxy (Vit-D Deficiency, Fractures)   Ambulatory referral to Hematology / Oncology     Requested Prescriptions    No prescriptions requested or ordered in this encounter    Return in about 2 months (around 09/29/2022) for Give her f/u with PCP Dr. Margarita Robinson.  Karle Plumber, MD, FACP

## 2022-08-01 ENCOUNTER — Encounter: Payer: Self-pay | Admitting: Oncology

## 2022-08-02 ENCOUNTER — Other Ambulatory Visit: Payer: Self-pay

## 2022-08-07 ENCOUNTER — Ambulatory Visit: Payer: BC Managed Care – PPO | Admitting: Family Medicine

## 2022-08-20 ENCOUNTER — Ambulatory Visit: Payer: BC Managed Care – PPO | Attending: Family Medicine

## 2022-08-21 ENCOUNTER — Encounter: Payer: Self-pay | Admitting: Internal Medicine

## 2022-08-21 DIAGNOSIS — E559 Vitamin D deficiency, unspecified: Secondary | ICD-10-CM | POA: Insufficient documentation

## 2022-08-21 LAB — VITAMIN D 25 HYDROXY (VIT D DEFICIENCY, FRACTURES): Vit D, 25-Hydroxy: 20.6 ng/mL — ABNORMAL LOW (ref 30.0–100.0)

## 2022-09-05 ENCOUNTER — Other Ambulatory Visit: Payer: Self-pay

## 2022-09-05 ENCOUNTER — Telehealth (INDEPENDENT_AMBULATORY_CARE_PROVIDER_SITE_OTHER): Payer: BC Managed Care – PPO | Admitting: Student in an Organized Health Care Education/Training Program

## 2022-09-05 ENCOUNTER — Encounter (HOSPITAL_COMMUNITY): Payer: Self-pay | Admitting: Student in an Organized Health Care Education/Training Program

## 2022-09-05 ENCOUNTER — Other Ambulatory Visit (HOSPITAL_COMMUNITY): Payer: Self-pay

## 2022-09-05 DIAGNOSIS — F32A Depression, unspecified: Secondary | ICD-10-CM | POA: Diagnosis not present

## 2022-09-05 DIAGNOSIS — F411 Generalized anxiety disorder: Secondary | ICD-10-CM

## 2022-09-05 MED ORDER — TRAZODONE HCL 50 MG PO TABS
50.0000 mg | ORAL_TABLET | Freq: Every evening | ORAL | 3 refills | Status: DC | PRN
  Filled 2022-09-05: qty 60, 30d supply, fill #0

## 2022-09-05 MED ORDER — BUPROPION HCL ER (XL) 150 MG PO TB24
150.0000 mg | ORAL_TABLET | ORAL | 3 refills | Status: DC
  Filled 2022-09-05: qty 30, 30d supply, fill #0
  Filled 2022-09-30 – 2022-10-03 (×2): qty 30, 30d supply, fill #1
  Filled 2022-11-07: qty 30, 30d supply, fill #2

## 2022-09-05 MED ORDER — BUPROPION HCL ER (XL) 300 MG PO TB24
300.0000 mg | ORAL_TABLET | ORAL | 3 refills | Status: DC
  Filled 2022-09-05: qty 30, 30d supply, fill #0
  Filled 2022-09-30 – 2022-10-03 (×2): qty 30, 30d supply, fill #1

## 2022-09-05 NOTE — Progress Notes (Signed)
BH MD/PA/NP OP Progress Note  09/05/2022 4:05 PM Alicia Robinson  MRN:  [id number]  Chief Complaint:  Chief Complaint  Patient presents with   Follow-up   Virtual Visit via Video Note  I connected with Alicia Robinson on 09/05/22 at 10:00 AM EST by a video enabled telemedicine application and verified that I am speaking with the correct person using two identifiers.  Location: Patient: Home Provider: Office   I discussed the limitations of evaluation and management by telemedicine and the availability of in person appointments. The patient expressed understanding and agreed to proceed.  History of Present Illness:   Alicia Robinson is a 46 yo patient with PPH of depression, anxiety, SI.  Patient was prescribed the following at her last visit: Wellbutrin XL 450 mg, Abilify 5 mg, hydroxyzine 10 mg 3 times daily as needed, BuSpar 10 mg 3 times daily, trazodone 50-100 mg nightly.  On assessment today patient reports that she is only taking the Wellbutrin XL 450 mg and the trazodone.  Patient reports that she has been doing very well on this regimen and endorses that she never felt that the Abilify was necessary.  Patient reports that she also was recently diagnosed with renal insufficiency and she was worried that starting a new medication with possible renal adverse effects would be an issue.  Patient reports that she had this diagnosis in either 07/05/2022 or 08/05/2022.  Patient reports her mood is "happy" and her appetite has drastically improved.  Patient reports she is getting approximately 3 meals a day and is sleeping well.  Patient reports her anxiety is also improved.  Patient reports that she lives home alone but she plans to visit her daughter during the holidays.  Patient reports that she is doing well at work and denies any SI, HI and AVH.    I discussed the assessment and treatment plan with the patient. The patient was provided an opportunity to ask questions and all were answered. The  patient agreed with the plan and demonstrated an understanding of the instructions.   The patient was advised to call back or seek an in-person evaluation if the symptoms worsen or if the condition fails to improve as anticipated.  I provided 15 minutes of non-face-to-face time during this encounter.   [name], MD  Visit Diagnosis:    ICD-10-CM   1. Moderately severe depression  F32.A buPROPion (WELLBUTRIN XL) 300 MG 24 hr tablet    buPROPion (WELLBUTRIN XL) 150 MG 24 hr tablet    traZODone (DESYREL) 50 MG tablet    2. Generalized anxiety disorder  F41.1 traZODone (DESYREL) 50 MG tablet      Past Psychiatric History: depression, anxiety, and suicidal ideation.    Past Medical History:  Past Medical History:  Diagnosis Date   Anxiety    Depression    Hyperlipidemia    Hypertension    Stroke [facility]) 2016   "mild"   Suicidal overdose ([place])     Past Surgical History:  Procedure Laterality Date   CHOLECYSTECTOMY     SIGMOIDOSCOPY     pt is unsure about this   TUBAL LIGATION     UPPER GASTROINTESTINAL ENDOSCOPY      Family Psychiatric History: Mother-depression  Family History:  Family History  Problem Relation Age of Onset   Hypertension Mother    Diabetes Mother    Hypertension Father    Diabetes Father    Diabetes Maternal Grandmother    Hypertension Maternal Grandmother  Colon cancer Neg Hx    Esophageal cancer Neg Hx    Rectal cancer Neg Hx    Stomach cancer Neg Hx     Social History:  Social History   Socioeconomic History   Marital status: Single    Spouse name: Not on file   Number of children: 3   Years of education: Not on file   Highest education level: Associate degree: occupational, Hotel manager, or vocational program  Occupational History   Not on file  Tobacco Use   Smoking status: Never   Smokeless tobacco: Never  Vaping Use   Vaping Use: Never used  Substance and Sexual Activity   Alcohol use: Yes    Comment: weekends,  socially   Drug use: Not Currently    Types: Marijuana   Sexual activity: Not Currently  Other Topics Concern   Not on file  Social History Narrative   Not on file   Social Determinants of Health   Financial Resource Strain: Not on file  Food Insecurity: Not on file  Transportation Needs: No Transportation Needs (06/13/2020)   PRAPARE - Hydrologist (Medical): No    Lack of Transportation (Non-Medical): No  Physical Activity: Not on file  Stress: Not on file  Social Connections: Not on file    Allergies:  Allergies  Allergen Reactions   Nsaids     Renal insufficiency- no per pt    Metabolic Disorder Labs: Lab Results  Component Value Date   HGBA1C 5.7 (H) 03/10/2020   MPG 108.28 11/19/2018   MPG 105.41 09/22/2017   No results found for: "PROLACTIN" Lab Results  Component Value Date   CHOL 172 04/17/2022   TRIG 106 04/17/2022   HDL 46 04/17/2022   CHOLHDL 3.7 04/17/2022   VLDL 16 11/19/2018   LDLCALC 107 (H) 04/17/2022   LDLCALC 120 (H) 07/03/2021   Lab Results  Component Value Date   TSH 1.000 03/10/2020   TSH 1.578 11/19/2018    Therapeutic Level Labs: No results found for: "LITHIUM" No results found for: "VALPROATE" No results found for: "CBMZ"  Current Medications: Current Outpatient Medications  Medication Sig Dispense Refill   ascorbic acid (VITAMIN C) 500 MG tablet Take 500 mg by mouth daily.     atorvastatin (LIPITOR) 20 MG tablet Take 1 tablet (20 mg total) by mouth daily. 90 tablet 1   buPROPion (WELLBUTRIN XL) 150 MG 24 hr tablet Take 1 tablet (150 mg total) by mouth every morning. Take along with 300 mg tablet=450 mg 30 tablet 3   buPROPion (WELLBUTRIN XL) 300 MG 24 hr tablet Take 1 tablet (300 mg total) by mouth every morning. Take along with 300 mg tablet=450 mg 30 tablet 3   busPIRone (BUSPAR) 10 MG tablet Take 1 tablet (10 mg total) by mouth 3 (three) times daily. (Patient taking differently: Take 10 mg by  mouth 3 (three) times daily as needed (anxiety).) 90 tablet 3   cyclobenzaprine (FLEXERIL) 10 MG tablet Take 1 tablet (10 mg total) by mouth 3 (three) times dailyfor 30 days. (Patient not taking: Reported on 07/30/2022) 90 tablet 0   hydrochlorothiazide (HYDRODIURIL) 25 MG tablet Take 1 tablet (25 mg total) by mouth daily. 90 tablet 1   hydrOXYzine (ATARAX) 10 MG tablet Take 1 tablet (10 mg total) by mouth 3 (three) times daily as needed. (Patient taking differently: Take 10 mg by mouth 3 (three) times daily as needed for anxiety.) 90 tablet 3   omeprazole (PRILOSEC)  20 MG capsule Take 1 capsule (20 mg total) by mouth 2 (two) times daily before a meal. (Patient taking differently: Take 20 mg by mouth daily.) 180 capsule 0   traZODone (DESYREL) 50 MG tablet Take 1-2 tablets (50-100 mg total) by mouth at bedtime as needed for sleep. 60 tablet 3   No current facility-administered medications for this visit.     Musculoskeletal: Defer  Psychiatric Specialty Exam: Review of Systems  Psychiatric/Behavioral:  Negative for decreased concentration, dysphoric mood, hallucinations and suicidal ideas. The patient is not nervous/anxious.     There were no vitals taken for this visit.There is no height or weight on file to calculate BMI.  General Appearance: Casual  Eye Contact:  Good  Speech:  Clear and Coherent  Volume:  Normal  Mood:   "Happy"  Affect:  Appropriate  Thought Process:  Coherent  Orientation:  Full (Time, Place, and Person)  Thought Content: Logical   Suicidal Thoughts:  No  Homicidal Thoughts:  No  Memory:  Immediate;   Good Recent;   Good  Judgement:  Good  Insight:  Fair  Psychomotor Activity:  Normal  Concentration:  Concentration: Fair  Recall:  NA  Fund of Knowledge: Good  Language: Good  Akathisia:  No  Handed:    AIMS (if indicated): not done  Assets:  Communication Skills Desire for Improvement Housing Resilience Social Support Vocational/Educational   ADL's:  Intact  Cognition: WNL  Sleep:  Good   Screenings: AIMS    Flowsheet Row Admission (Discharged) from 11/19/2018 in [place] [facility] Admission (Discharged) from 09/21/2017 in [place] [facility] Admission (Discharged) from OP Visit from 07/11/2017 in [place] [facility]  AIMS Total Score 0 0 0      AUDIT    Flowsheet Row Admission (Discharged) from 11/19/2018 in [place] [facility] Admission (Discharged) from OP Visit from 07/11/2017 in [place] [facility]  Alcohol Use Disorder Identification Test Final Score (AUDIT) 0 1      GAD-7    Flowsheet Row Office Visit from 07/30/2022 in [place] Video Visit from 06/06/2022 in [facility] Office Visit from 04/17/2022 in [place] Video Visit from 03/06/2022 in [facility] Video Visit from 09/28/2021 in [facility]  Total GAD-7 Score '15 14 15 16 7      '$ PHQ2-9    [place] Office Visit from 07/30/2022 in [place] Video Visit from 06/06/2022 in [facility] Office Visit from 04/17/2022 in [place] Video Visit from 03/06/2022 in [facility] Video Visit from 09/28/2021 in [place]  PHQ-2 Total Score '6 6 5 5 1  '$ PHQ-9 Total Score '18 19 17 19 3      '$ [place] from 06/23/2022 in [place] Video Visit from 03/06/2022 in [facility] Video Visit from 12/27/2020 in [place] No Risk Error: Q7 should not be populated when Q6 is No No Risk        Assessment and Plan:  Rettie Laird is a 46 yo  patient with PPH of depression, anxiety, SI.  Based on assessment today, patient appears to have drastically improved and is stable on her  current regimen of Wellbutrin and trazodone.  Patient does not have any additional concerns.  Patient mood and anxiety appear stable.  MDD Anxiety - Continue Wellbutrin XL 450 mg daily - Continue trazodone 50-100 mg nightly as needed, insomnia   Follow-up in approximately 3 months  Collaboration of Care: Collaboration of Care:   Patient/Guardian was advised Release of Information must be obtained prior to any record release in order to collaborate their care with an outside provider. Patient/Guardian was advised if they have not already done so to contact the registration department to sign all necessary forms in order for [place] to release information regarding their care.   Consent: Patient/Guardian gives verbal consent for treatment and assignment of benefits for services provided during this visit. Patient/Guardian expressed understanding and agreed to proceed.   PGY-3 [name], MD 09/05/2022, 4:05 PM

## 2022-09-10 ENCOUNTER — Other Ambulatory Visit (HOSPITAL_COMMUNITY): Payer: Self-pay

## 2022-09-30 ENCOUNTER — Telehealth: Payer: BC Managed Care – PPO | Admitting: Nurse Practitioner

## 2022-09-30 ENCOUNTER — Ambulatory Visit: Payer: Self-pay

## 2022-09-30 ENCOUNTER — Encounter: Payer: Self-pay | Admitting: Physician Assistant

## 2022-09-30 ENCOUNTER — Ambulatory Visit: Payer: BC Managed Care – PPO | Admitting: Physician Assistant

## 2022-09-30 VITALS — BP 110/75 | HR 101 | Ht 70.0 in | Wt 179.0 lb

## 2022-09-30 DIAGNOSIS — R634 Abnormal weight loss: Secondary | ICD-10-CM | POA: Diagnosis not present

## 2022-09-30 DIAGNOSIS — N926 Irregular menstruation, unspecified: Secondary | ICD-10-CM | POA: Diagnosis not present

## 2022-09-30 DIAGNOSIS — R42 Dizziness and giddiness: Secondary | ICD-10-CM | POA: Diagnosis not present

## 2022-09-30 DIAGNOSIS — R519 Headache, unspecified: Secondary | ICD-10-CM | POA: Diagnosis not present

## 2022-09-30 DIAGNOSIS — R631 Polydipsia: Secondary | ICD-10-CM | POA: Diagnosis not present

## 2022-09-30 DIAGNOSIS — N924 Excessive bleeding in the premenopausal period: Secondary | ICD-10-CM

## 2022-09-30 NOTE — Progress Notes (Unsigned)
Established Patient Office Visit  Subjective   Patient ID: Alicia Robinson, female    DOB: May 05, 1976  Age: 47 y.o. MRN: [id number]  Chief Complaint  Patient presents with   Menorrhagia    Menses x 20 days, pt describes as clots when she urinates, nothing seen on pad, Describes as black, or very dark blood    Dizziness    And headaches    Weight Loss   breast tenderness    Recent mammo 10.27.23- Normal     States that she has been feeling fatigued, has been experiencing sweating, but also will feel very cold at the same time.  States that she has had an elongated menses, states that this one has lasted 20 days, states that she has more clotting than normal, blood is very dark.  States prior to this her menses were very regular.  States that she has been drinking a gallon of water a day the end of last fall after being diagnosed with dehydration.  States that she still feels thirsty despite drinking this much water.  States that she also has been working on improving her diet, states that she stopped eating fried foods, does not eat rice.  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  Today it is 179.  States that she also has been experiencing episodes of dizziness lasting just short moments, mostly when she stands up.  States that she has been experiencing more headaches than normal, and is also concerned for left breast tenderness.      Past Medical History:  Diagnosis Date   Anxiety    Depression    Hyperlipidemia    Hypertension    Stroke [facility]) 2016   "mild"   Suicidal overdose ([place])    Social History   Socioeconomic History   Marital status: Single    Spouse name: Not on file   Number of children: 3   Years of education: Not on file   Highest education level: Associate degree: occupational,  Hotel manager, or vocational program  Occupational History   Not on file  Tobacco Use   Smoking status: Never   Smokeless tobacco: Never  Vaping Use   Vaping Use: Never used  Substance and Sexual Activity   Alcohol use: Yes    Comment: weekends, socially   Drug use: Not Currently    Types: Marijuana   Sexual activity: Not Currently  Other Topics Concern   Not on file  Social History Narrative   Not on file   Social Determinants of Health   Financial Resource Strain: Not on file  Food Insecurity: Not on file  Transportation Needs: No Transportation Needs (06/13/2020)   PRAPARE - Hydrologist (Medical): No    Lack of Transportation (Non-Medical): No  Physical Activity: Not on file  Stress: Not on file  Social Connections: Not on file  Intimate Partner Violence: Not on file   Family History  Problem Relation Age of Onset   Hypertension Mother    Diabetes Mother    Hypertension Father    Diabetes Father    Diabetes Maternal Grandmother    Hypertension Maternal Grandmother    Colon cancer Neg Hx    Esophageal cancer Neg Hx    Rectal cancer Neg Hx  Stomach cancer Neg Hx    Allergies  Allergen Reactions   Nsaids     Renal insufficiency- no per pt    ROS    Objective:     BP 110/75 (BP Location: Left Arm, Patient Position: Sitting, Cuff Size: Large)   Pulse (!) 101   Ht '5\' 10"'$  (1.778 m)   Wt 179 lb (81.2 kg)   SpO2 99%   BMI 25.68 kg/m  BP Readings from Last 3 Encounters:  09/30/22 110/75  07/30/22 113/78  06/23/22 (!) 139/97   Wt Readings from Last 3 Encounters:  09/30/22 179 lb (81.2 kg)  07/30/22 192 lb (87.1 kg)  06/23/22 196 lb 3.4 oz (89 kg)      Physical Exam      Assessment & Plan:   Problem List Items Addressed This Visit   None Visit Diagnoses     Abnormal vaginal bleeding in premenopausal patient    -  Primary   Relevant Orders   CBC with Differential/Platelet (Completed)   TSH (Completed)   Basic  Metabolic Panel (Completed)   Loss of weight       Relevant Orders   CBC with Differential/Platelet (Completed)   Basic Metabolic Panel (Completed)   Acute nonintractable headache, unspecified headache type       Dizziness and giddiness         1. Abnormal vaginal bleeding in premenopausal patient Patient education given on perimenopause,  Mammogram completed October 2023 was within normal limits.  Patient is currently taking hydrochlorothiazide 25 mg once daily, has had weight loss, has significantly increased water intake, dizziness may be attributed to blood pressure drops.  Will have patient check blood pressure on a daily basis, keep a written log and have available for all office visits  Red flags given for prompt reevaluation - CBC with Differential/Platelet - TSH - Basic Metabolic Panel  2. Loss of weight Patient has made weight loss efforts, has increased water intake - CBC with Differential/Platelet - Basic Metabolic Panel  3. Acute nonintractable headache, unspecified headache type Patient education given on supportive care  4. Dizziness and giddiness    I have reviewed the patient's medical history (PMH, PSH, Social History, Family History, Medications, and allergies) , and have been updated if relevant. I spent 30 minutes reviewing chart and  face to face time with patient.     Return if symptoms worsen or fail to improve.    Loraine Grip Mayers, PA-C

## 2022-09-30 NOTE — Progress Notes (Signed)
Virtual Visit Consent   Alicia Robinson, you are scheduled for a virtual visit with a [facility] provider today. Just as with appointments in the office, your consent must be obtained to participate. Your consent will be active for this visit and any virtual visit you may have with one of our providers in the next 365 days. If you have a MyChart account, a copy of this consent can be sent to you electronically.  As this is a virtual visit, video technology does not allow for your provider to perform a traditional examination. This may limit your provider's ability to fully assess your condition. If your provider identifies any concerns that need to be evaluated in person or the need to arrange testing (such as labs, EKG, etc.), we will make arrangements to do so. Although advances in technology are sophisticated, we cannot ensure that it will always work on either your end or our end. If the connection with a video visit is poor, the visit may have to be switched to a telephone visit. With either a video or telephone visit, we are not always able to ensure that we have a secure connection.  By engaging in this virtual visit, you consent to the provision of healthcare and authorize for your insurance to be billed (if applicable) for the services provided during this visit. Depending on your insurance coverage, you may receive a charge related to this service.  I need to obtain your verbal consent now. Are you willing to proceed with your visit today? Alicia Robinson has provided verbal consent on 09/30/2022 for a virtual visit (video or telephone). Apolonio Schneiders, FNP  Date: 09/30/2022 9:18 AM  Virtual Visit via Video Note   I, Apolonio Schneiders, connected with  Alicia Robinson  ([id number], [birth year]) on 09/30/22 at  9:15 AM EST by a video-enabled telemedicine application and verified that I am speaking with the correct person using two identifiers.  Location: Patient: Virtual Visit Location Patient:  Home Provider: Virtual Visit Location Provider: Home Office   I discussed the limitations of evaluation and management by telemedicine and the availability of in person appointments. The patient expressed understanding and agreed to proceed.    History of Present Illness: Alicia Robinson is a 47 y.o. who identifies as a female who was assigned female at birth, and is being seen today with complaints of menstraul cycle that continued for 10 days at the end of December and then returned last week.   Historically she would get cramps with her menstraul cycle but she has not been experiencing that in the past month.  Prior to this she was still regular with a menses monthly.   She is having darker brown discharge, sometimes she is also passing clots.  She had a Pap 11/24/20  She has also been experiencing night sweats   She has also been experiencing lightheaded symptoms when she Robinson up   She has also been experiencing more headaches than usual  Also noting left breast tenderness   She drinks a gallon of water a day and still feels thirsty  She has lost 10lbs in the past week   Denies a history of DM- [place] was [place] and had some blood work there for DM workup and was told she has some insulin resistance- bloodwork unavailable for review  She has been diagnosed with renal insufficiency in the past   She has three adult children   Mother Father and grandmother with  DM  She has a great Aunt with Breast CA no other family history of reproductive CA  She has been getting mammograms annually   History of HTN on HCTZ   Problems:  Patient Active Problem List   Diagnosis Date Noted   Vitamin D insufficiency 08/21/2022   PTSD (post-traumatic stress disorder) 06/28/2021   Generalized anxiety disorder 06/28/2021   Influenza vaccine refused 07/12/2020   Renal insufficiency 07/12/2020   Cannabis use disorder, mild, abuse 09/22/2017   MDD (major depressive  disorder) 09/21/2017   Hyperlipidemia 07/14/2017   GERD (gastroesophageal reflux disease) 07/14/2017   HTN (hypertension) 07/13/2017   MDD (major depressive disorder), single episode, severe with psychosis ([place]) 07/11/2017    Allergies:  Allergies  Allergen Reactions   Nsaids     Renal insufficiency- no per pt   Medications:  Current Outpatient Medications:    ascorbic acid (VITAMIN C) 500 MG tablet, Take 500 mg by mouth daily., Disp: , Rfl:    atorvastatin (LIPITOR) 20 MG tablet, Take 1 tablet (20 mg total) by mouth daily., Disp: 90 tablet, Rfl: 1   buPROPion (WELLBUTRIN XL) 150 MG 24 hr tablet, Take 1 tablet (150 mg total) by mouth every morning. Take along with 300 mg tablet=450 mg, Disp: 30 tablet, Rfl: 3   buPROPion (WELLBUTRIN XL) 300 MG 24 hr tablet, Take 1 tablet (300 mg total) by mouth every morning. Take along with 300 mg tablet=450 mg, Disp: 30 tablet, Rfl: 3   hydrochlorothiazide (HYDRODIURIL) 25 MG tablet, Take 1 tablet (25 mg total) by mouth daily., Disp: 90 tablet, Rfl: 1   omeprazole (PRILOSEC) 20 MG capsule, Take 1 capsule (20 mg total) by mouth 2 (two) times daily before a meal. (Patient taking differently: Take 20 mg by mouth daily.), Disp: 180 capsule, Rfl: 0   traZODone (DESYREL) 50 MG tablet, Take 1-2 tablets (50-100 mg total) by mouth at bedtime as needed for sleep., Disp: 60 tablet, Rfl: 3   Observations/Objective: Patient is well-developed, well-nourished in no acute distress.  Resting comfortably  at home.  Head is normocephalic, atraumatic.  No labored breathing.  Speech is clear and coherent with logical content.  Patient is alert and oriented at baseline.    Assessment and Plan: 1. Weight loss Needs in person evaluation for lab work to r/o metabolic syndrome DM etc  2. Menstrual cycle problem Follow up PCP for pap and bloodwork breast exam   3. Increased thirst Needs in person evaluation for lab work to r/o metabolic syndrome DM etc     Patient  understands in person evaluation and labwork is needed at this time. Message sent to PCP and patient will call to schedule appointment   Follow Up Instructions: I discussed the assessment and treatment plan with the patient. The patient was provided an opportunity to ask questions and all were answered. The patient agreed with the plan and demonstrated an understanding of the instructions.  A copy of instructions were sent to the patient via MyChart unless otherwise noted below.    The patient was advised to call back or seek an in-person evaluation if the symptoms worsen or if the condition fails to improve as anticipated.  Time:  I spent  minutes with the patient via telehealth technology discussing the above problems/concerns.    Apolonio Schneiders, FNP

## 2022-09-30 NOTE — Telephone Encounter (Signed)
  Chief Complaint: Vaginal bleeding 20 + days. Dizziness, weakness, Breast pain Symptoms: above + Chills night sweats Frequency: Bleeding 20 days, Breast pain 1 week.  Pertinent Negatives: Patient denies fever Disposition: '[]'$ ED /'[]'$ Urgent Care (no appt availability in office) / '[]'$ Appointment(In office/virtual)/ '[]'$  [facility]/ '[]'$ Home Care/ '[]'$ Refused Recommended Disposition /'[x]'$ [facility]/ '[]'$  Follow-up with PCP Additional Notes: PT had a VV today and was told to contact her PCP. Pt has had vaginal bleeding for 20+ days. Pt also reports breast tenderness for 1 week. PT reporting weakness/dizziness and night sweats/chills.  Pt will go to mobile unit with PCP follow up scheduled.

## 2022-09-30 NOTE — Patient Instructions (Signed)
We will call you with your lab results.  Kennieth Rad, PA-C Physician Assistant [facility] [email or web address]   Perimenopause Perimenopause is the normal time of a woman's life when the levels of estrogen, the female hormone produced by the ovaries, begin to decrease. This leads to changes in menstrual periods before they stop completely (menopause). Perimenopause can begin 2-8 years before menopause. During perimenopause, the ovaries may or may not produce an egg and a woman can still become pregnant. What are the causes? This condition is caused by a natural change in hormone levels that happens as you get older. What increases the risk? This condition is more likely to start at an earlier age if you have certain medical conditions or have undergone treatments, including: A tumor of the pituitary gland in the brain. A disease that affects the ovaries and hormone production. Certain cancer treatments, such as chemotherapy or hormone therapy, or radiation therapy on the pelvis. Heavy smoking and excessive alcohol use. Family history of early menopause. What are the signs or symptoms? Perimenopausal changes affect each woman differently. Symptoms of this condition may include: Hot flashes. Irregular menstrual periods. Night sweats. Changes in feelings about sex. This could be a decrease in sex drive or an increased discomfort around your sexuality. Vaginal dryness. Headaches. Mood swings. Depression. Problems sleeping (insomnia). Memory problems or trouble concentrating. Irritability. Tiredness. Weight gain. Anxiety. Trouble getting pregnant. How is this diagnosed? This condition is diagnosed based on your medical history, a physical exam, your age, your menstrual history, and your symptoms. Hormone tests may also be done. How is this treated? In some cases, no treatment is needed. You and your health care provider  should make a decision together about whether treatment is necessary. Treatment will be based on your individual condition and preferences. Various treatments are available, such as: Menopausal hormone therapy (MHT). Medicines to treat specific symptoms. Acupuncture. Vitamin or herbal supplements. Before starting treatment, make sure to let your health care provider know if you have a personal or family history of: Heart disease. Breast cancer. Blood clots. Diabetes. Osteoporosis. Follow these instructions at home: Medicines Take over-the-counter and prescription medicines only as told by your health care provider. Take vitamin supplements only as told by your health care provider. Talk with your health care provider before starting any herbal supplements. Lifestyle  Do not use any products that contain nicotine or tobacco, such as cigarettes, e-cigarettes, and chewing tobacco. If you need help quitting, ask your health care provider. Get at least 30 minutes of physical activity on 5 or more days each week. Eat a balanced diet that includes fresh fruits and vegetables, whole grains, soybeans, eggs, lean meat, and low-fat dairy. Avoid alcoholic and caffeinated beverages, as well as spicy foods. This may help prevent hot flashes. Get 7-8 hours of sleep each night. Dress in layers that can be removed to help you manage hot flashes. Find ways to manage stress, such as deep breathing, meditation, or journaling. General instructions  Keep track of your menstrual periods, including: When they occur. How heavy they are and how long they last. How much time passes between periods. Keep track of your symptoms, noting when they start, how often you have them, and how long they last. Use vaginal lubricants or moisturizers to help with vaginal dryness and improve comfort during sex. You can still become pregnant if you are having irregular periods. Make sure you use contraception during  perimenopause if you do not want to get pregnant.  Keep all follow-up visits. This is important. This includes any group therapy or counseling. Contact a health care provider if: You have heavy vaginal bleeding or pass blood clots. Your period lasts more than 2 days longer than normal. Your periods are recurring sooner than 21 days. You bleed after having sex. You have pain during sex. Get help right away if you have: Chest pain, trouble breathing, or trouble talking. Severe depression. Pain when you urinate. Severe headaches. Vision problems. Summary Perimenopause is the time when a woman's body begins to move into menopause. This may happen naturally or as a result of other health problems or medical treatments. Perimenopause can begin 2-8 years before menopause, and it can last for several years. Perimenopausal symptoms can be managed through medicines, lifestyle changes, and complementary therapies such as acupuncture. This information is not intended to replace advice given to you by your health care provider. Make sure you discuss any questions you have with your health care provider. Document Revised: 02/17/2020 Document Reviewed: 02/17/2020 Elsevier Patient Education  [place].

## 2022-09-30 NOTE — Telephone Encounter (Signed)
Pt called and she states that she is currently at work and will go to mobile unit after work at 1:30 pm.

## 2022-09-30 NOTE — Patient Instructions (Signed)
Call PCP office for urgent follow up

## 2022-09-30 NOTE — Telephone Encounter (Signed)
   Reason for Disposition  Periods last > 7 days  Answer Assessment - Initial Assessment Questions 1. AMOUNT: "Describe the bleeding that you are having."    - SPOTTING: spotting, or pinkish / brownish mucous discharge; does not fill panty liner or pad    - MILD:  less than 1 pad / hour; less than patient's usual menstrual bleeding   - MODERATE: 1-2 pads / hour; 1 menstrual cup every 6 hours; small-medium blood clots (e.g., pea, grape, small coin)   - SEVERE: soaking 2 or more pads/hour for 2 or more hours; 1 menstrual cup every 2 hours; bleeding not contained by pads or continuous red blood from vagina; large blood clots (e.g., golf ball, large coin)      Mild - wipes - clots in toilet - very dark 2. ONSET: "When did the bleeding begin?" "Is it continuing now?"     20 days 3. MENSTRUAL PERIOD: "When was the last normal menstrual period?" "How is this different than your period?"     Heavier longer 4. REGULARITY: "How regular are your periods?"     Very regular 5. ABDOMEN PAIN: "Do you have any pain?" "How bad is the pain?"  (e.g., Scale 1-10; mild, moderate, or severe)   - MILD (1-3): doesn't interfere with normal activities, abdomen soft and not tender to touch    - MODERATE (4-7): interferes with normal activities or awakens from sleep, abdomen tender to touch    - SEVERE (8-10): excruciating pain, doubled over, unable to do any normal activities      Cramps lower, lower back 6. PREGNANCY: "Is there any chance you are pregnant?" "When was your last menstrual period?"     no 7. BREASTFEEDING: "Are you breastfeeding?"      8. HORMONE MEDICINES: "Are you taking any hormone medicines, prescription or over-the-counter?" (e.g., birth control pills, estrogen)     no 9. BLOOD THINNER MEDICINES: "Do you take any blood thinners?" (e.g., Coumadin / warfarin, Pradaxa / dabigatran, aspirin)     no 10. CAUSE: "What do you think is causing the bleeding?" (e.g., recent gyn surgery, recent gyn  procedure; known bleeding disorder, cervical cancer, polycystic ovarian disease, fibroids)         No  - unsure 11. HEMODYNAMIC STATUS: "Are you weak or feeling lightheaded?" If Yes, ask: "Can you stand and walk normally?"        Yes - weakness 12. OTHER SYMPTOMS: "What other symptoms are you having with the bleeding?" (e.g., passed tissue, vaginal discharge, fever, menstrual-type cramps)       Clots  Protocols used: Vaginal Bleeding - Abnormal-A-AH

## 2022-10-01 ENCOUNTER — Encounter: Payer: Self-pay | Admitting: Physician Assistant

## 2022-10-01 LAB — CBC WITH DIFFERENTIAL/PLATELET
Basophils Absolute: 0.1 10*3/uL (ref 0.0–0.2)
Basos: 1 %
EOS (ABSOLUTE): 0.3 10*3/uL (ref 0.0–0.4)
Eos: 2 %
Hematocrit: 41.2 % (ref 34.0–46.6)
Hemoglobin: 14.2 g/dL (ref 11.1–15.9)
Immature Grans (Abs): 0.1 10*3/uL (ref 0.0–0.1)
Immature Granulocytes: 1 %
Lymphocytes Absolute: 3.1 10*3/uL (ref 0.7–3.1)
Lymphs: 26 %
MCH: 30 pg (ref 26.6–33.0)
MCHC: 34.5 g/dL (ref 31.5–35.7)
MCV: 87 fL (ref 79–97)
Monocytes Absolute: 0.9 10*3/uL (ref 0.1–0.9)
Monocytes: 8 %
Neutrophils Absolute: 7.3 10*3/uL — ABNORMAL HIGH (ref 1.4–7.0)
Neutrophils: 62 %
Platelets: 525 10*3/uL — ABNORMAL HIGH (ref 150–450)
RBC: 4.74 x10E6/uL (ref 3.77–5.28)
RDW: 15.7 % — ABNORMAL HIGH (ref 11.7–15.4)
WBC: 11.8 10*3/uL — ABNORMAL HIGH (ref 3.4–10.8)

## 2022-10-01 LAB — BASIC METABOLIC PANEL
BUN/Creatinine Ratio: 12 (ref 9–23)
BUN: 14 mg/dL (ref 6–24)
CO2: 23 mmol/L (ref 20–29)
Calcium: 9.8 mg/dL (ref 8.7–10.2)
Chloride: 100 mmol/L (ref 96–106)
Creatinine, Ser: 1.18 mg/dL — ABNORMAL HIGH (ref 0.57–1.00)
Glucose: 113 mg/dL — ABNORMAL HIGH (ref 70–99)
Potassium: 4.2 mmol/L (ref 3.5–5.2)
Sodium: 139 mmol/L (ref 134–144)
eGFR: 57 mL/min/{1.73_m2} — ABNORMAL LOW (ref >59)

## 2022-10-01 LAB — TSH: TSH: 2.11 u[IU]/mL (ref 0.450–4.500)

## 2022-10-03 ENCOUNTER — Other Ambulatory Visit (HOSPITAL_COMMUNITY): Payer: Self-pay

## 2022-10-03 ENCOUNTER — Other Ambulatory Visit: Payer: Self-pay

## 2022-10-07 ENCOUNTER — Other Ambulatory Visit: Payer: Self-pay

## 2022-10-18 DIAGNOSIS — D75839 Thrombocytosis, unspecified: Secondary | ICD-10-CM | POA: Insufficient documentation

## 2022-10-18 DIAGNOSIS — D473 Essential (hemorrhagic) thrombocythemia: Secondary | ICD-10-CM | POA: Insufficient documentation

## 2022-10-18 NOTE — Assessment & Plan Note (Deleted)
-  she has had thrombocytosis for at least 5 years (since her first lab in [place] in 2015), slightly worse lately  -

## 2022-10-18 NOTE — Progress Notes (Deleted)
[facility]   [phone] [phone]   Clinic New consult Note   Patient Care Team: Charlott Rakes, MD as PCP - General (Family Medicine) 10/18/2022  CHIEF COMPLAINTS/PURPOSE OF CONSULTATION:  ***  HISTORY OF PRESENTING ILLNESS:  Alicia Robinson 47 y.o. female is here because of ***  ***She was found to have abnormal CBC from *** ***She denies recent chest pain on exertion, shortness of breath on minimal exertion, pre-syncopal episodes, or palpitations. ***She had not noticed any recent bleeding such as epistaxis, hematuria or hematochezia ***The patient denies over the counter NSAID ingestion. She is not *** on antiplatelets agents. Her last colonoscopy was *** ***She had no prior history or diagnosis of cancer. Her age appropriate screening programs are up-to-date. ***She denies any pica and eats a variety of diet. ***She never donated blood or received blood transfusion ***The patient was prescribed oral iron supplements and she takes ***  MEDICAL HISTORY:  Past Medical History:  Diagnosis Date   Anxiety    Depression    Hyperlipidemia    Hypertension    Stroke ([place]) 2016   "mild"   Suicidal overdose ([place])     SURGICAL HISTORY: Past Surgical History:  Procedure Laterality Date   CHOLECYSTECTOMY     SIGMOIDOSCOPY     pt is unsure about this   TUBAL LIGATION     UPPER GASTROINTESTINAL ENDOSCOPY      SOCIAL HISTORY: Social History   Socioeconomic History   Marital status: Single    Spouse name: Not on file   Number of children: 3   Years of education: Not on file   Highest education level: Associate degree: occupational, Hotel manager, or vocational program  Occupational History   Not on file  Tobacco Use   Smoking status: Never   Smokeless tobacco: Never  Vaping Use   Vaping Use: Never used  Substance and Sexual Activity   Alcohol use: Yes    Comment: weekends, socially   Drug use: Not Currently    Types: Marijuana    Sexual activity: Not Currently  Other Topics Concern   Not on file  Social History Narrative   Not on file   Social Determinants of Health   Financial Resource Strain: Not on file  Food Insecurity: Not on file  Transportation Needs: No Transportation Needs (06/13/2020)   PRAPARE - Transportation    Lack of Transportation (Medical): No    Lack of Transportation (Non-Medical): No  Physical Activity: Not on file  Stress: Not on file  Social Connections: Not on file  Intimate Partner Violence: Not on file    FAMILY HISTORY: Family History  Problem Relation Age of Onset   Hypertension Mother    Diabetes Mother    Hypertension Father    Diabetes Father    Diabetes Maternal Grandmother    Hypertension Maternal Grandmother    Colon cancer Neg Hx    Esophageal cancer Neg Hx    Rectal cancer Neg Hx    Stomach cancer Neg Hx     ALLERGIES:  is allergic to nsaids.  MEDICATIONS:  Current Outpatient Medications  Medication Sig Dispense Refill   ascorbic acid (VITAMIN C) 500 MG tablet Take 500 mg by mouth daily.     atorvastatin (LIPITOR) 20 MG tablet Take 1 tablet (20 mg total) by mouth daily. 90 tablet 1   buPROPion (WELLBUTRIN XL) 150 MG 24 hr tablet Take 1 tablet (150 mg total) by mouth every morning. Take along with 300 mg  tablet=450 mg 30 tablet 3   buPROPion (WELLBUTRIN XL) 300 MG 24 hr tablet Take 1 tablet (300 mg total) by mouth every morning. Take along with 300 mg tablet=450 mg 30 tablet 3   ferrous sulfate 324 MG TBEC Take 324 mg by mouth daily with breakfast.     hydrochlorothiazide (HYDRODIURIL) 25 MG tablet Take 1 tablet (25 mg total) by mouth daily. 90 tablet 1   omeprazole (PRILOSEC) 20 MG capsule Take 1 capsule (20 mg total) by mouth 2 (two) times daily before a meal. (Patient taking differently: Take 20 mg by mouth daily.) 180 capsule 0   traZODone (DESYREL) 50 MG tablet Take 1-2 tablets (50-100 mg total) by mouth at bedtime as needed for sleep. (Patient not taking:  Reported on 09/30/2022) 60 tablet 3   Vitamin D, Ergocalciferol, (DRISDOL) 1.25 MG (50000 UNIT) CAPS capsule Take 50,000 Units by mouth every 7 (seven) days.     No current facility-administered medications for this visit.    REVIEW OF SYSTEMS:   Constitutional: Denies fevers, chills or abnormal night sweats Eyes: Denies blurriness of vision, double vision or watery eyes Ears, nose, mouth, throat, and face: Denies mucositis or sore throat Respiratory: Denies cough, dyspnea or wheezes Cardiovascular: Denies palpitation, chest discomfort or lower extremity swelling Gastrointestinal:  Denies nausea, heartburn or change in bowel habits Skin: Denies abnormal skin rashes Lymphatics: Denies new lymphadenopathy or easy bruising Neurological:Denies numbness, tingling or new weaknesses Behavioral/Psych: Mood is stable, no new changes  All other systems were reviewed with the patient and are negative.  PHYSICAL EXAMINATION: ECOG PERFORMANCE STATUS: {CHL ONC ECOG PS:[phone]}  There were no vitals filed for this visit. There were no vitals filed for this visit.  GENERAL:alert, no distress and comfortable SKIN: skin color, texture, turgor are normal, no rashes or significant lesions EYES: normal, conjunctiva are pink and non-injected, sclera clear OROPHARYNX:no exudate, no erythema and lips, buccal mucosa, and tongue normal  NECK: supple, thyroid normal size, non-tender, without nodularity LYMPH:  no palpable lymphadenopathy in the cervical, axillary or inguinal LUNGS: clear to auscultation and percussion with normal breathing effort HEART: regular rate & rhythm and no murmurs and no lower extremity edema ABDOMEN:abdomen soft, non-tender and normal bowel sounds Musculoskeletal:no cyanosis of digits and no clubbing  PSYCH: alert & oriented x 3 with fluent speech NEURO: no focal motor/sensory deficits  LABORATORY DATA:  I have reviewed the data as listed    Latest Ref Rng & Units 09/30/2022     5:34 PM 06/23/2022    2:12 PM 06/07/2020    3:41 PM  CBC  WBC 3.4 - 10.8 x10E3/uL 11.8  12.4  13.6   Hemoglobin 11.1 - 15.9 g/dL 14.2  12.4  14.0   Hematocrit 34.0 - 46.6 % 41.2  37.1  42.2   Platelets 150 - 450 x10E3/uL 525  527  611        Latest Ref Rng & Units 09/30/2022    5:34 PM 06/23/2022    2:12 PM 04/17/2022    9:53 AM  CMP  Glucose 70 - 99 mg/dL 113  91  96   BUN 6 - 24 mg/dL '14  18  14   '$ Creatinine 0.57 - 1.00 mg/dL 1.18  1.19  1.12   Sodium 134 - 144 mmol/L 139  139  142   Potassium 3.5 - 5.2 mmol/L 4.2  4.2  4.3   Chloride 96 - 106 mmol/L 100  110  102   CO2 20 - 29  mmol/L '23  25  25   '$ Calcium 8.7 - 10.2 mg/dL 9.8  8.6  9.4   Total Protein 6.0 - 8.5 g/dL   6.8   Total Bilirubin 0.0 - 1.2 mg/dL   0.3   Alkaline Phos 44 - 121 IU/L   71   AST 0 - 40 IU/L   29   ALT 0 - 32 IU/L   35      RADIOGRAPHIC STUDIES: I have personally reviewed the radiological images as listed and agreed with the findings in the report. No results found.  ASSESSMENT & PLAN:  Thrombocytosis -she has had thrombocytosis for at least 5 years (since her first lab in [place] in 2015), slightly worse lately  -   All questions were answered. The patient knows to call the clinic with any problems, questions or concerns. I spent {CHL ONC TIME VISIT - [id number] counseling the patient face to face. The total time spent in the appointment was {CHL ONC TIME VISIT - [id number] and more than 50% was on counseling.     Truitt Merle, MD 10/18/22 10:21 PM

## 2022-10-19 ENCOUNTER — Inpatient Hospital Stay: Payer: BC Managed Care – PPO

## 2022-10-19 ENCOUNTER — Inpatient Hospital Stay: Payer: BC Managed Care – PPO | Attending: Hematology | Admitting: Hematology

## 2022-10-19 DIAGNOSIS — Z87891 Personal history of nicotine dependence: Secondary | ICD-10-CM | POA: Insufficient documentation

## 2022-10-19 DIAGNOSIS — D75839 Thrombocytosis, unspecified: Secondary | ICD-10-CM | POA: Insufficient documentation

## 2022-10-19 DIAGNOSIS — Z803 Family history of malignant neoplasm of breast: Secondary | ICD-10-CM | POA: Insufficient documentation

## 2022-10-19 DIAGNOSIS — Z8673 Personal history of transient ischemic attack (TIA), and cerebral infarction without residual deficits: Secondary | ICD-10-CM | POA: Insufficient documentation

## 2022-10-19 DIAGNOSIS — I10 Essential (primary) hypertension: Secondary | ICD-10-CM | POA: Insufficient documentation

## 2022-10-19 DIAGNOSIS — R42 Dizziness and giddiness: Secondary | ICD-10-CM | POA: Insufficient documentation

## 2022-10-19 DIAGNOSIS — D72829 Elevated white blood cell count, unspecified: Secondary | ICD-10-CM | POA: Insufficient documentation

## 2022-10-19 DIAGNOSIS — R5383 Other fatigue: Secondary | ICD-10-CM | POA: Insufficient documentation

## 2022-10-19 NOTE — Progress Notes (Unsigned)
Complete physical exam  Patient: Alicia Robinson   DOB: Sep 16, 1976   47 y.o. Female  MRN: [id number]  Subjective:    No chief complaint on file.   Alicia Robinson is a 47 y.o. female who presents today for a complete physical exam. She reports consuming a {diet types:17450} diet. {types:19826} She generally feels {DESC; WELL/FAIRLY WELL/POORLY:18703}. She reports sleeping {DESC; WELL/FAIRLY WELL/POORLY:18703}. She {does/does not:200015} have additional problems to discuss today.   Last seen 07/2022 by Wynetta Emery PCP Margarita Rana Jerie Basford is a 47 y.o. female who presents for ER f/u.  PCP is Dr. Margarita Rana whom she last saw 06/2021. Her concerns today include:  Patient with history of HTN, HL, MDD, CVA, prediabetes   Patient seen in the emergency room 06/23/2022 with chest pain and left lower back pain without radiculopathy.  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 -In regards to her chest pain, troponin negative x2.  EKG without ischemic changes..   Patient presents today for follow-up from that ER visit wanting to discuss lab results that were done on that visit. She notes that her white blood cell and platelet counts have been elevated.  In looking through her chart it looks like these have been elevated at least for the past 2 years.  No history of blood clots or spontaneous bleeding.  She also notes that her calcium level was a little low.  Previously it was normal.  She notes that creatinine is elevated.  It was 1.19.  Looks like she has been 1.06-1.26 range for the past 2 years.  GFR was 57 with range being in the 50s to 60s over the past few years.  She is not on any NSAIDs.  1. Thrombocytosis Since the thrombocytosis and leukocytosis have been chronic for the past several years, I recommend referral to hematology for further evaluation. - Ambulatory referral to Hematology / Oncology   2. Leukocytosis, unspecified type See #1 above - Ambulatory referral to Hematology /  Oncology   3. Stage 3a chronic kidney disease ([place]) Advised to drink adequate fluids during the day. Advised to avoid NSAIDs.  Kidney function will need to be monitored over time.   4. Hypocalcemia May be due to low vitamin D level.  She is agreeable to having vitamin D level checked. - VITAMIN D 25 Hydroxy (Vit-D Deficiency, Fractures); Future   Need Pap  Tdap    Most recent fall risk assessment:    09/30/2022    5:03 PM  [place] in the past year? 0  Number falls in past yr: 0  Injury with Fall? 0  Risk for fall due to : No Fall Risks  Follow up Falls evaluation completed     Most recent depression screenings:    09/30/2022    5:16 PM 07/30/2022   10:09 AM  PHQ 2/9 Scores  PHQ - 2 Score 2 6  PHQ- 9 Score 4 18    {VISON DENTAL STD PSA (Optional):27386}  {History (Optional):23778}  Patient Care Team: Charlott Rakes, MD as PCP - General (Family Medicine)   Outpatient Medications Prior to Visit  Medication Sig   ascorbic acid (VITAMIN C) 500 MG tablet Take 500 mg by mouth daily.   atorvastatin (LIPITOR) 20 MG tablet Take 1 tablet (20 mg total) by mouth daily.   buPROPion (WELLBUTRIN XL) 150 MG 24 hr tablet Take 1 tablet (150 mg total) by mouth every morning. Take along with 300 mg tablet=450 mg  buPROPion (WELLBUTRIN XL) 300 MG 24 hr tablet Take 1 tablet (300 mg total) by mouth every morning. Take along with 300 mg tablet=450 mg   ferrous sulfate 324 MG TBEC Take 324 mg by mouth daily with breakfast.   hydrochlorothiazide (HYDRODIURIL) 25 MG tablet Take 1 tablet (25 mg total) by mouth daily.   omeprazole (PRILOSEC) 20 MG capsule Take 1 capsule (20 mg total) by mouth 2 (two) times daily before a meal. (Patient taking differently: Take 20 mg by mouth daily.)   traZODone (DESYREL) 50 MG tablet Take 1-2 tablets (50-100 mg total) by mouth at bedtime as needed for sleep. (Patient not taking: Reported on 09/30/2022)   Vitamin D, Ergocalciferol, (DRISDOL) 1.25 MG  (50000 UNIT) CAPS capsule Take 50,000 Units by mouth every 7 (seven) days.   No facility-administered medications prior to visit.    ROS        Objective:     There were no vitals taken for this visit. {Vitals History (Optional):23777}  Physical Exam   No results found for any visits on 10/22/22. {Show previous labs (optional):23779}    Assessment & Plan:    Routine Health Maintenance and Physical Exam  Immunization History  Administered Date(s) Administered   PFIZER(Purple Top)SARS-COV-2 Vaccination 01/12/2020, 02/02/2020    Health Maintenance  Topic Date Due   DTaP/Tdap/Td (1 - Tdap) Never done   COVID-19 Vaccine (3 - 2023-24 season) 05/17/2022   PAP SMEAR-Modifier  11/25/2022   INFLUENZA VACCINE  12/15/2022 (Originally 04/16/2022)   MAMMOGRAM  07/13/2023   COLONOSCOPY (Pts 45-94yr Insurance coverage will need to be confirmed)  08/16/2031   Hepatitis C Screening  Completed   HIV Screening  Completed   HPV VACCINES  Aged Out    Discussed health benefits of physical activity, and encouraged her to engage in regular exercise appropriate for her age and condition.  Problem List Items Addressed This Visit   None  No follow-ups on file.     PAsencion Noble MD

## 2022-10-21 ENCOUNTER — Telehealth: Payer: Self-pay | Admitting: Nurse Practitioner

## 2022-10-21 NOTE — Telephone Encounter (Signed)
R/s pt's new hem appt per 2/5 staff msg. Pt is aware of new appt date/time.

## 2022-10-22 ENCOUNTER — Encounter: Payer: Self-pay | Admitting: Critical Care Medicine

## 2022-10-22 ENCOUNTER — Ambulatory Visit: Payer: BC Managed Care – PPO | Attending: Critical Care Medicine | Admitting: Critical Care Medicine

## 2022-10-22 VITALS — BP 127/85 | HR 78 | Ht 70.0 in | Wt 181.2 lb

## 2022-10-22 DIAGNOSIS — I10 Essential (primary) hypertension: Secondary | ICD-10-CM | POA: Diagnosis not present

## 2022-10-22 DIAGNOSIS — R3589 Other polyuria: Secondary | ICD-10-CM | POA: Diagnosis not present

## 2022-10-22 DIAGNOSIS — N289 Disorder of kidney and ureter, unspecified: Secondary | ICD-10-CM

## 2022-10-22 DIAGNOSIS — T50905A Adverse effect of unspecified drugs, medicaments and biological substances, initial encounter: Secondary | ICD-10-CM | POA: Insufficient documentation

## 2022-10-22 DIAGNOSIS — R631 Polydipsia: Secondary | ICD-10-CM | POA: Insufficient documentation

## 2022-10-22 DIAGNOSIS — D75839 Thrombocytosis, unspecified: Secondary | ICD-10-CM

## 2022-10-22 HISTORY — DX: Adverse effect of unspecified drugs, medicaments and biological substances, initial encounter: T50.905A

## 2022-10-22 NOTE — Assessment & Plan Note (Signed)
Hypertension well-controlled she is only taking 12.5 mg hydrochlorothiazide at this time we will continue same and check renal panel

## 2022-10-22 NOTE — Assessment & Plan Note (Signed)
Plan to check hemoglobin A1c and urinalysis and renal panel

## 2022-10-22 NOTE — Patient Instructions (Addendum)
Decrease bupropion to 150 mg daily we will let your mental health doctor know  Please keep your hematology appointment upcoming  Urinalysis hemoglobin A1c and renal profile were checked today  Keep your physical appointment coming up  Dr. Joya Gaskins will notify Dr. Margarita Rana of our findings  Keep your physical appointment as scheduled  Stay on one half of hydrochlorothiazide pill daily

## 2022-10-22 NOTE — Assessment & Plan Note (Signed)
Renal insufficiency with a creatinine clearance of less than 60 this may be interacting with the high dose of Wellbutrin contributing to polyuria and polydipsia this is a known side effect of this medication

## 2022-10-22 NOTE — Progress Notes (Signed)
Established Patient Office Visit  Subjective   Patient ID: Alicia Robinson, female    DOB: 07-24-1976  Age: 47 y.o. MRN: [id number]  Chief Complaint  Patient presents with   Polydipsia    This is a primary care of Dr. Margarita Rana who comes in today with polydipsia and polyuria.  The patient had been seen on the mobile medicine unit several weeks ago.  Labs were not remarkable.  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  Patient notes increased thirst and she will drink a gallon of water a day.  This been going on for 6 months.  She denies nausea or vomiting.  She does have renal insufficiency as well with a GFR of 57.  Her vaginal bleeding complaint that she went to the mobile unit with is actually resolved itself.  Interestingly her mental health doctor has her on a very large dose of bupropion at 450 mg daily.  With her creatinine a typical dose is 150 mg daily.  She has not been screened for diabetes in 2 years.  She does tend to run a slightly high blood sugar.      Review of Systems  Constitutional:  Negative for chills, diaphoresis, fever, malaise/fatigue and weight loss.  HENT:  Negative for congestion, ear discharge, ear pain, hearing loss, nosebleeds, sore throat and tinnitus.   Eyes:  Negative for blurred vision, double vision, photophobia and discharge.  Respiratory:  Negative for cough, hemoptysis, sputum production, shortness of breath, wheezing and stridor.        No excess mucus  Cardiovascular:  Negative for chest pain, palpitations, orthopnea, claudication, leg swelling and PND.  Gastrointestinal:  Negative for abdominal pain, blood in stool, constipation, diarrhea, heartburn, melena, nausea and vomiting.  Genitourinary:  Positive for frequency. Negative for dysuria, flank pain, hematuria and urgency.       Polyuria flank pain on the right  Musculoskeletal:  Negative for back pain, falls,  joint pain, myalgias and neck pain.  Skin:  Negative for itching and rash.  Neurological:  Negative for dizziness, tingling, tremors, sensory change, speech change, focal weakness, seizures, loss of consciousness, weakness and headaches.  Endo/Heme/Allergies:  Positive for polydipsia. Negative for environmental allergies. Does not bruise/bleed easily.  Psychiatric/Behavioral:  Negative for depression, hallucinations, memory loss, substance abuse and suicidal ideas. The patient is not nervous/anxious and does not have insomnia.   All other systems reviewed and are negative.     Objective:     BP 127/85 (BP Location: Right Arm, Patient Position: Sitting, Cuff Size: Normal)   Pulse 78   Ht '5\' 10"'$  (1.778 m)   Wt 181 lb 3.2 oz (82.2 kg)   SpO2 100%   BMI 26.00 kg/m    Physical Exam Vitals reviewed.  Constitutional:      Appearance: Normal appearance. She is well-developed. She is not diaphoretic.  HENT:     Head: Normocephalic and atraumatic.     Nose: Nose normal. No nasal deformity, septal deviation, mucosal edema or rhinorrhea.     Right Sinus: No maxillary sinus tenderness or frontal sinus tenderness.     Left Sinus: No maxillary sinus tenderness or frontal sinus tenderness.     Mouth/Throat:     Mouth: Mucous membranes are moist.     Pharynx: Oropharynx is clear. No oropharyngeal exudate.  Eyes:     General: No scleral icterus.    Conjunctiva/sclera: Conjunctivae normal.  Pupils: Pupils are equal, round, and reactive to light.  Neck:     Thyroid: No thyromegaly.     Vascular: No carotid bruit or JVD.     Trachea: Trachea normal. No tracheal tenderness or tracheal deviation.  Cardiovascular:     Rate and Rhythm: Normal rate and regular rhythm.     Chest Wall: PMI is not displaced.     Pulses: Normal pulses. No decreased pulses.     Heart sounds: Normal heart sounds, S1 normal and S2 normal. Heart sounds not distant. No murmur heard.    No systolic murmur is present.      No diastolic murmur is present.     No friction rub. No gallop. No S3 or S4 sounds.  Pulmonary:     Effort: Pulmonary effort is normal. No tachypnea, accessory muscle usage or respiratory distress.     Breath sounds: Normal breath sounds. No stridor. No decreased breath sounds, wheezing, rhonchi or rales.  Chest:     Chest wall: No tenderness.  Abdominal:     General: Bowel sounds are normal. There is no distension.     Palpations: Abdomen is soft. Abdomen is not rigid.     Tenderness: There is no abdominal tenderness. There is right CVA tenderness. There is no guarding or rebound.  Musculoskeletal:        General: Normal range of motion.     Cervical back: Normal range of motion and neck supple. No edema, erythema or rigidity. No muscular tenderness. Normal range of motion.  Lymphadenopathy:     Head:     Right side of head: No submental or submandibular adenopathy.     Left side of head: No submental or submandibular adenopathy.     Cervical: No cervical adenopathy.  Skin:    General: Skin is warm and dry.     Coloration: Skin is not pale.     Findings: No rash.     Nails: There is no clubbing.     Comments: Normal skin turgor  Neurological:     Mental Status: She is alert and oriented to person, place, and time.     Sensory: No sensory deficit.  Psychiatric:        Mood and Affect: Mood normal.        Speech: Speech normal.        Behavior: Behavior normal.        Thought Content: Thought content normal.        Judgment: Judgment normal.      No results found for any visits on 10/22/22.    The 10-year ASCVD risk score (Arnett DK, et al., 2019) is: 4.3%    Assessment & Plan:   Problem List Items Addressed This Visit       Cardiovascular and Mediastinum   HTN (hypertension)    Hypertension well-controlled she is only taking 12.5 mg hydrochlorothiazide at this time we will continue same and check renal panel        Genitourinary   Renal insufficiency     Renal insufficiency with a creatinine clearance of less than 60 this may be interacting with the high dose of Wellbutrin contributing to polyuria and polydipsia this is a known side effect of this medication        Hematopoietic and Hemostatic   Thrombocytosis    Patient has upcoming appointment with hematology encouraged to keep same        Other   Polyuria    As per  polydipsia      Relevant Orders   Hemoglobin A1c   Renal Function Panel   Urinalysis   Drug reaction - Primary    Adverse drug reaction I suspect the polyuria polydipsia is contributed to by very high dose of Wellbutrin in the setting of renal insufficiency  Plan is to reduce dosage to 150 mg daily and will notify mental health      Polydipsia    Plan to check hemoglobin A1c and urinalysis and renal panel      30 minutes spent extra time needed sorted out potential drug reaction causing urinary change Return for htn, polyuria, polydipsia.    Asencion Noble, MD

## 2022-10-22 NOTE — Assessment & Plan Note (Signed)
Patient has upcoming appointment with hematology encouraged to keep same

## 2022-10-22 NOTE — Assessment & Plan Note (Signed)
Adverse drug reaction I suspect the polyuria polydipsia is contributed to by very high dose of Wellbutrin in the setting of renal insufficiency  Plan is to reduce dosage to 150 mg daily and will notify mental health

## 2022-10-22 NOTE — Assessment & Plan Note (Signed)
As per polydipsia

## 2022-10-23 LAB — URINALYSIS
Bilirubin, UA: NEGATIVE
Glucose, UA: NEGATIVE
Ketones, UA: NEGATIVE
Nitrite, UA: NEGATIVE
Protein,UA: NEGATIVE
RBC, UA: NEGATIVE
Specific Gravity, UA: 1.009 (ref 1.005–1.030)
Urobilinogen, Ur: 0.2 mg/dL (ref 0.2–1.0)
pH, UA: 7.5 (ref 5.0–7.5)

## 2022-10-23 LAB — HEMOGLOBIN A1C
Est. average glucose Bld gHb Est-mCnc: 108 mg/dL
Hgb A1c MFr Bld: 5.4 % (ref 4.8–5.6)

## 2022-10-23 LAB — RENAL FUNCTION PANEL
Albumin: 4.1 g/dL (ref 3.9–4.9)
BUN/Creatinine Ratio: 6 — ABNORMAL LOW (ref 9–23)
BUN: 7 mg/dL (ref 6–24)
CO2: 22 mmol/L (ref 20–29)
Calcium: 9.4 mg/dL (ref 8.7–10.2)
Chloride: 101 mmol/L (ref 96–106)
Creatinine, Ser: 1.16 mg/dL — ABNORMAL HIGH (ref 0.57–1.00)
Glucose: 108 mg/dL — ABNORMAL HIGH (ref 70–99)
Phosphorus: 3.8 mg/dL (ref 3.0–4.3)
Potassium: 4.3 mmol/L (ref 3.5–5.2)
Sodium: 139 mmol/L (ref 134–144)
eGFR: 58 mL/min/{1.73_m2} — ABNORMAL LOW (ref >59)

## 2022-10-23 NOTE — Progress Notes (Signed)
Let pt know urine is not concentrated suggesting losing water out of kidney due to high bupropron dosing should get better with reduced dose, no diabetes  overall kidney liver and body chemistry levels normal

## 2022-10-24 ENCOUNTER — Telehealth: Payer: Self-pay

## 2022-10-24 NOTE — Telephone Encounter (Signed)
-----   Message from Elsie Stain, MD sent at 10/23/2022  6:15 AM EST ----- Let pt know urine is not concentrated suggesting losing water out of kidney due to high bupropron dosing should get better with reduced dose, no diabetes  overall kidney liver and body chemistry levels normal

## 2022-10-24 NOTE — Telephone Encounter (Signed)
Pt was called and vm was left, Information has been sent to nurse pool.   Interpreter id [phone]

## 2022-10-30 ENCOUNTER — Other Ambulatory Visit: Payer: Self-pay

## 2022-10-30 ENCOUNTER — Inpatient Hospital Stay: Payer: BC Managed Care – PPO

## 2022-10-30 ENCOUNTER — Encounter: Payer: Self-pay | Admitting: Nurse Practitioner

## 2022-10-30 ENCOUNTER — Inpatient Hospital Stay (HOSPITAL_BASED_OUTPATIENT_CLINIC_OR_DEPARTMENT_OTHER): Payer: BC Managed Care – PPO | Admitting: Nurse Practitioner

## 2022-10-30 VITALS — BP 125/88 | HR 94 | Temp 98.2°F | Resp 16 | Wt 185.7 lb

## 2022-10-30 DIAGNOSIS — R5383 Other fatigue: Secondary | ICD-10-CM | POA: Diagnosis not present

## 2022-10-30 DIAGNOSIS — R42 Dizziness and giddiness: Secondary | ICD-10-CM | POA: Diagnosis not present

## 2022-10-30 DIAGNOSIS — Z8673 Personal history of transient ischemic attack (TIA), and cerebral infarction without residual deficits: Secondary | ICD-10-CM | POA: Diagnosis not present

## 2022-10-30 DIAGNOSIS — Z87891 Personal history of nicotine dependence: Secondary | ICD-10-CM | POA: Diagnosis not present

## 2022-10-30 DIAGNOSIS — D75839 Thrombocytosis, unspecified: Secondary | ICD-10-CM

## 2022-10-30 DIAGNOSIS — D72828 Other elevated white blood cell count: Secondary | ICD-10-CM

## 2022-10-30 DIAGNOSIS — I10 Essential (primary) hypertension: Secondary | ICD-10-CM | POA: Diagnosis not present

## 2022-10-30 DIAGNOSIS — Z803 Family history of malignant neoplasm of breast: Secondary | ICD-10-CM | POA: Diagnosis not present

## 2022-10-30 DIAGNOSIS — D72829 Elevated white blood cell count, unspecified: Secondary | ICD-10-CM | POA: Diagnosis not present

## 2022-10-30 LAB — CBC WITH DIFFERENTIAL (CANCER CENTER ONLY)
Abs Immature Granulocytes: 0.02 10*3/uL (ref 0.00–0.07)
Basophils Absolute: 0 10*3/uL (ref 0.0–0.1)
Basophils Relative: 1 %
Eosinophils Absolute: 0.2 10*3/uL (ref 0.0–0.5)
Eosinophils Relative: 3 %
HCT: 38.2 % (ref 36.0–46.0)
Hemoglobin: 13.2 g/dL (ref 12.0–15.0)
Immature Granulocytes: 0 %
Lymphocytes Relative: 26 %
Lymphs Abs: 2.2 10*3/uL (ref 0.7–4.0)
MCH: 30.7 pg (ref 26.0–34.0)
MCHC: 34.6 g/dL (ref 30.0–36.0)
MCV: 88.8 fL (ref 80.0–100.0)
Monocytes Absolute: 0.6 10*3/uL (ref 0.1–1.0)
Monocytes Relative: 7 %
Neutro Abs: 5.5 10*3/uL (ref 1.7–7.7)
Neutrophils Relative %: 63 %
Platelet Count: 449 10*3/uL — ABNORMAL HIGH (ref 150–400)
RBC: 4.3 MIL/uL (ref 3.87–5.11)
RDW: 16 % — ABNORMAL HIGH (ref 11.5–15.5)
WBC Count: 8.6 10*3/uL (ref 4.0–10.5)
nRBC: 0 % (ref 0.0–0.2)

## 2022-10-30 LAB — IRON AND IRON BINDING CAPACITY (CC-WL,HP ONLY)
Iron: 63 ug/dL (ref 28–170)
Saturation Ratios: 21 % (ref 10.4–31.8)
TIBC: 304 ug/dL (ref 250–450)
UIBC: 241 ug/dL

## 2022-10-30 LAB — FERRITIN: Ferritin: 22 ng/mL (ref 11–307)

## 2022-10-30 NOTE — Progress Notes (Signed)
[place]   [phone] [phone]   Clinic New consult Note   Patient Care Team: Charlott Rakes, MD as PCP - General (Family Medicine) Date of Service: 10/30/2022  CHIEF COMPLAINTS/PURPOSE OF CONSULTATION:  Thrombocytosis, referred by PCP Dr. Karle Plumber  HISTORY OF PRESENTING ILLNESS:  Alicia Robinson 47 y.o. female with PMH including HTN, HL, CVA, GERD, renal insufficiency, generalized anxiety disorder, major depressive disorder, and PTSD is here because of thrombocytosis.  Seen by Dr. Wynetta Emery 07/30/2022 for ED follow-up, where she was seen on 06/23/2022 for atypical chest pain.  Workup showed normal electrolytes, mildly elevated SCR 1.19, leukocytosis WBC 12.4 with elevated platelets 527.  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  She had a normal CBC in 2013 (care everywhere), and was first found to have CBC 09/13/13 during ED visit for GI illness and pain. At that time WBC 16, normal hgb and plt. Later on 02/28/2017 with first CBC in our system, WBC 14.3, absolute neutrophils 7.8, platelet 429, and absolute lymphocytes 5.1.  On subsequent CBCs, WBC range 11.1 - 16.2, and platelet range 429 - 611; lymphocytes normalized.  Denies history of chronic or recurrent infections.   Initially from [place] moved here when she was young.  Socially, she is single.  3 adult healthy children. She works remotely with [employer] in [employer].  She is independent with ADLs and usually very active.  She smoked cigarettes for 2 years, quit 28 years ago.  Smokes marijuana occasionally last 1 month ago.  Drinks alcohol socially, denies other drug use.  Has significant family history of cancer or blood disorder, only a great great aunt with breast cancer.  She is up-to-date with age-appropriate cancer screenings.  Today she presents by herself.  From December to January she had a heavy menstrual period that  lasted 1 month.  She was lightheaded and had developed sweats and chills and was told she might be perimenopausal.  Started herself on oral iron which she has been taking for about a month.  She also donates plasma twice per week going on 7 months.  She denies other bleeding such as hematochezia, melena, or epistaxis.  Left chest/breast pain since November/December/2023.  Pain is occasionally present on palpation and/or with deep inspiration.  Cough, dyspnea.  For the past month she tires quickly needing to rest, which is unusual for her.  Usually she is very active on her day off work, cleaning the house, cooking for her family, but lately she has not been able to do as much.  11 pounds weight loss since 07/30/2022, she reportedly went to the [place] had some blood testing done, and was told to change her diet to avoid fatty/fried foods.  She  had CVA in 2016, the details of which are unclear.  She reportedly developed strokelike symptoms of paresthesia and facial droop after a period of intense stress (son went to jail the night prior).  She was evaluated and told she had a "broken blood vessel" in her head.  She took aspirin for 6 months.   MEDICAL HISTORY:  Past Medical History:  Diagnosis Date   Anxiety    Depression    Hyperlipidemia    Hypertension    Stroke [facility]) 2016   "mild"   Suicidal overdose ([place])     SURGICAL HISTORY: Past Surgical History:  Procedure Laterality Date   CHOLECYSTECTOMY     SIGMOIDOSCOPY  pt is unsure about this   TUBAL LIGATION     UPPER GASTROINTESTINAL ENDOSCOPY      SOCIAL HISTORY: Social History   Socioeconomic History   Marital status: Single    Spouse name: Not on file   Number of children: 3   Years of education: Not on file   Highest education level: Associate degree: occupational, Hotel manager, or vocational program  Occupational History   Not on file  Tobacco Use   Smoking status: Never   Smokeless tobacco: Never  Vaping Use    Vaping Use: Never used  Substance and Sexual Activity   Alcohol use: Yes    Comment: weekends, socially   Drug use: Not Currently    Types: Marijuana   Sexual activity: Not Currently  Other Topics Concern   Not on file  Social History Narrative   Not on file   Social Determinants of Health   Financial Resource Strain: Not on file  Food Insecurity: Not on file  Transportation Needs: No Transportation Needs (06/13/2020)   PRAPARE - Transportation    Lack of Transportation (Medical): No    Lack of Transportation (Non-Medical): No  Physical Activity: Not on file  Stress: Not on file  Social Connections: Not on file  Intimate Partner Violence: Not on file    FAMILY HISTORY: Family History  Problem Relation Age of Onset   Hypertension Mother    Diabetes Mother    Hypertension Father    Diabetes Father    Diabetes Maternal Grandmother    Hypertension Maternal Grandmother    Colon cancer Neg Hx    Esophageal cancer Neg Hx    Rectal cancer Neg Hx    Stomach cancer Neg Hx     ALLERGIES:  is allergic to nsaids.  MEDICATIONS:  Current Outpatient Medications  Medication Sig Dispense Refill   ascorbic acid (VITAMIN C) 500 MG tablet Take 500 mg by mouth daily.     atorvastatin (LIPITOR) 20 MG tablet Take 1 tablet (20 mg total) by mouth daily. 90 tablet 1   buPROPion (WELLBUTRIN XL) 150 MG 24 hr tablet Take 1 tablet (150 mg total) by mouth every morning. Take along with 300 mg tablet=450 mg 30 tablet 3   ferrous sulfate 324 MG TBEC Take 324 mg by mouth daily with breakfast.     hydrochlorothiazide (HYDRODIURIL) 25 MG tablet Take 1 tablet (25 mg total) by mouth daily. (Patient taking differently: Take 12.5 mg by mouth daily.) 90 tablet 1   omeprazole (PRILOSEC) 20 MG capsule Take 1 capsule (20 mg total) by mouth 2 (two) times daily before a meal. (Patient taking differently: Take 20 mg by mouth daily.) 180 capsule 0   traZODone (DESYREL) 50 MG tablet Take 1-2 tablets (50-100 mg  total) by mouth at bedtime as needed for sleep. 60 tablet 3   Vitamin D, Ergocalciferol, (DRISDOL) 1.25 MG (50000 UNIT) CAPS capsule Take 50,000 Units by mouth every 7 (seven) days.     No current facility-administered medications for this visit.    REVIEW OF SYSTEMS:   Constitutional: Denies fevers, chills  (+) fatigue  (+) chills/night sweats Eyes: Denies blurriness of vision, double vision or watery eyes Ears, nose, mouth, throat, and face: Denies mucositis or sore throat Respiratory: Denies cough, dyspnea or wheezes Cardiovascular: Denies palpitation, chest discomfort or lower extremity swelling (+) nonexertional left chest wall pain Gastrointestinal:  Denies nausea, pain, constipation, diarrhea, hematochezia, heartburn or change in bowel habits Skin: Denies abnormal skin rashes Lymphatics: Denies new  lymphadenopathy or easy bruising (+) 1 month heavy menstrual bleeding Neurological:Denies numbness, tingling or new weaknesses (+) history of ?CVA 2016 Behavioral/Psych: (+) Anxiety, depression, PTSD  (+) mood is stable All other systems were reviewed with the patient and are negative.  PHYSICAL EXAMINATION: ECOG PERFORMANCE STATUS: 1 - Symptomatic but completely ambulatory  Vitals:   10/30/22 1406  BP: 125/88  Pulse: 94  Resp: 16  Temp: 98.2 F (36.8 C)  SpO2: 100%   Filed Weights   10/30/22 1406  Weight: 185 lb 11.2 oz (84.2 kg)    GENERAL:alert, no distress and comfortable SKIN: No rash EYES: sclera clear NECK: without mass LYMPH:  no palpable cervical, axillary lymphadenopathy  LUNGS: clear with normal breathing effort HEART: regular rate & rhythm, no lower extremity edema ABDOMEN:abdomen soft, non-tender and normal bowel sounds Musculoskeletal:no cyanosis of digits and no clubbing  PSYCH: alert & oriented x 3 with fluent speech NEURO: no focal motor/sensory deficits  LABORATORY DATA:  I have reviewed the data as listed    Latest Ref Rng & Units 10/30/2022     1:16 PM 09/30/2022    5:34 PM 06/23/2022    2:12 PM  CBC  WBC 4.0 - 10.5 K/uL 8.6  11.8  12.4   Hemoglobin 12.0 - 15.0 g/dL 13.2  14.2  12.4   Hematocrit 36.0 - 46.0 % 38.2  41.2  37.1   Platelets 150 - 400 K/uL 449  525  527        Latest Ref Rng & Units 10/22/2022    9:54 AM 09/30/2022    5:34 PM 06/23/2022    2:12 PM  CMP  Glucose 70 - 99 mg/dL 108  113  91   BUN 6 - 24 mg/dL 7  14  18   $ Creatinine 0.57 - 1.00 mg/dL 1.16  1.18  1.19   Sodium 134 - 144 mmol/L 139  139  139   Potassium 3.5 - 5.2 mmol/L 4.3  4.2  4.2   Chloride 96 - 106 mmol/L 101  100  110   CO2 20 - 29 mmol/L 22  23  25   $ Calcium 8.7 - 10.2 mg/dL 9.4  9.8  8.6      RADIOGRAPHIC STUDIES: I have personally reviewed the radiological images as listed and agreed with the findings in the report. No results found.  ASSESSMENT & PLAN: 47 year old female  Leukocytosis and thrombocytosis -We reviewed her medical record in detail with the patient. She had a normal CBC in 2013, developed leukocytosis in 2014 with ED visit for acute GI illness, then leukocytosis and thrombocytosis since 2018. WBC range 11.1 - 16.2, with predominant neutrophils, and platelet range 429 - 611  -We reviewed the common etiologies, including primary bone marrow condition such as ET/MPN, and benign etiologies such as blood loss/iron deficiency, chronic inflammation, vs other -She has no h/o chronic/recurrent infections.  -LMP lasted a month, she believes she is now perimenopausal, she started oral iron 1 month ago.  -Ms. Stipes appears stable. She reports fatigue after 1 month long menstrual period. Exam is benign.  -Today's CBC shows normal WBC, and improved plt 449, ferritin 22, and normal iron/TIBC. Normal hgb. We discussed the possibility of iron deficiency, even without anemia, which can cause thrombocytosis and leukocytosis. I recommend to continue oral iron.  -Will rule out MPN and check [place] to r/o liver/spleen disease. She agrees -F/up after  work up all back   Abnormal menses -She had normal periods until 08/2022 when  her period lasted 1 month -She has fatigue and lightheadedness, possibly from blood loss or perimenopause  -She started herself on oral iron 1 month ago, continue   Atypical chest pain, h/o CVA -She had a stroke in 2016 at age 2, details are unclear whether hemorrhagic vs ischemic. She was on aspirin for 6 months then stopped. No residual deficits -She reports intermittent left chest pain, non-exertional -She went to ED in 06/2022 for this pain, work up was unremarkable. -Today's exam is benign -F/up PCP   Age-appropriate health maintenance -I encouraged her to continue age appropriate health maintenance and cancer screenings, avoid smoking/alcohol, and exercise.  -She is overdue for first screening colonoscopy. Per PCP  PLAN: -Reviewed medical record -Lab today -ABD [place] in the next week -Continue oral iron -F/up after further work up  -Pt seen with Dr. Burr Medico    Orders Placed This Encounter  Procedures   US Abdomen Complete    Standing Status:   Future    Standing Expiration Date:   10/30/2023    Order Specific Question:   Reason for Exam (SYMPTOM  OR DIAGNOSIS REQUIRED)    Answer:   leukocytosis, thrombocytosis, fatigue, r/o MPN    Order Specific Question:   Preferred imaging location?    Answer:   [facility]   CBC with Differential (Cancer Center Only)    Standing Status:   Future    Number of Occurrences:   1    Standing Expiration Date:   10/31/2023   Ferritin    Standing Status:   Future    Number of Occurrences:   1    Standing Expiration Date:   10/31/2023   Iron and Iron Binding Capacity ([facility] only)    Standing Status:   Future    Number of Occurrences:   1    Standing Expiration Date:   10/31/2023   JAK2 (INCLUDING V617F AND EXON 12), MPL,& CALR W/RFL MPN PANEL (NGS)    Standing Status:   Future    Number of Occurrences:   1    Standing Expiration Date:   10/30/2023      All questions were answered. The patient knows to call the clinic with any problems, questions or concerns.     Alicia Feeling, NP 11/01/22   Addendum I have seen the patient, examined her. I agree with the assessment and and plan and have edited the notes.   47 year old female, with past medical history of stroke in 2016, hypertension, menorrhagia, presented with chronic mild thrombocytosis since 2016.  She also has mild leukocytosis with predominant neutrophils, no anemia.  I discussed the common etiology of thrombocytosis, including essential thrombocythemia, and reactive thrombocytosis, secondary to iron deficiency, chronic inflammation, or malignancy.  I will check her CBC and iron studies today, will also obtain MPN panel to rule out ET. We will also obtain US to evaluate her liver and spleen.  All questions were answered.  Will call her with the above results.  Truitt Merle MD 10/30/2022

## 2022-11-01 ENCOUNTER — Encounter: Payer: Self-pay | Admitting: Nurse Practitioner

## 2022-11-01 ENCOUNTER — Telehealth: Payer: Self-pay

## 2022-11-01 NOTE — Telephone Encounter (Addendum)
Called patient to make her aware of the message below and to schedule her US of the abdomen which is 2-23 @ 0845 must be NPO after 0000 the night prior. Patient voiced understanding.    ----- Message from Alla Feeling, NP sent at 11/01/2022  1:30 PM EST ----- Jenny Reichmann, please help schedule abd [place] in the next 1-2 weeks, and phone visit with me a few days later. When you notify patient, please let her know CBC shows normal WBC and plts improved, and to please continue oral iron.   Thanks, Regan Rakers NP

## 2022-11-06 LAB — JAK2 (INCLUDING V617F AND EXON 12), MPL,& CALR W/RFL MPN PANEL (NGS)

## 2022-11-07 ENCOUNTER — Ambulatory Visit: Payer: BC Managed Care – PPO | Admitting: Family Medicine

## 2022-11-07 ENCOUNTER — Other Ambulatory Visit (HOSPITAL_COMMUNITY): Payer: Self-pay

## 2022-11-08 ENCOUNTER — Ambulatory Visit (HOSPITAL_COMMUNITY)
Admission: RE | Admit: 2022-11-08 | Discharge: 2022-11-08 | Disposition: A | Discharge status: Home / Self Care | Payer: BC Managed Care – PPO | Source: Ambulatory Visit | Attending: Nurse Practitioner | Admitting: Nurse Practitioner

## 2022-11-08 DIAGNOSIS — D72828 Other elevated white blood cell count: Secondary | ICD-10-CM | POA: Diagnosis present

## 2022-11-08 DIAGNOSIS — D75839 Thrombocytosis, unspecified: Secondary | ICD-10-CM | POA: Diagnosis present

## 2022-11-11 ENCOUNTER — Encounter: Payer: Self-pay | Admitting: Nurse Practitioner

## 2022-11-11 ENCOUNTER — Inpatient Hospital Stay (HOSPITAL_BASED_OUTPATIENT_CLINIC_OR_DEPARTMENT_OTHER): Payer: BC Managed Care – PPO | Admitting: Nurse Practitioner

## 2022-11-11 DIAGNOSIS — D473 Essential (hemorrhagic) thrombocythemia: Secondary | ICD-10-CM | POA: Diagnosis not present

## 2022-11-11 MED ORDER — HYDROXYUREA 500 MG PO CAPS
ORAL_CAPSULE | ORAL | 0 refills | Status: DC
  Filled 2022-11-11: qty 12, 28d supply, fill #0
  Filled 2022-12-09: qty 12, 28d supply, fill #1

## 2022-11-11 NOTE — Progress Notes (Signed)
Patient Care Team: Alicia Rakes, MD as PCP - General (Family Medicine)   I connected with Alicia Robinson on 11/11/22 at 12:00 PM EST by telephone visit and verified that I am speaking with the correct person using two identifiers.   I discussed the limitations, risks, security and privacy concerns of performing an evaluation and management service by telemedicine and the availability of in-person appointments. I also discussed with the patient that there may be a patient responsible charge related to this service. The patient expressed understanding and agreed to proceed.   Other persons participating in the visit and their role in the encounter: None   Patient's location: Home  Provider's location: [place]: F/up thrombocytosis work up  Alicia Robinson: PENDING Hydrea 500 mg MWF  INTERVAL HISTORY Alicia Robinson presents for phone f/up as scheduled. Seen initially 10/30/22 for thrombocytosis. She is doing well since then. Energy and appetite are stable, feels good. Menstrual period in Feb was very light and only lasted 3 days 2/14 - 2/16, she continues oral iron.  ROS  All other systems reviewed and negative   Past Medical History:  Diagnosis Date   Anxiety    Depression    Hyperlipidemia    Hypertension    Stroke Alicia Robinson) 2016   "mild"   Suicidal overdose (Alicia Robinson)      Past Surgical History:  Procedure Laterality Date   CHOLECYSTECTOMY     SIGMOIDOSCOPY     pt is unsure about this   TUBAL LIGATION     UPPER GASTROINTESTINAL ENDOSCOPY       Outpatient Encounter Medications as of 11/11/2022  Medication Sig Note   ascorbic acid (VITAMIN C) 500 MG tablet Take 500 mg by mouth daily.    atorvastatin (LIPITOR) 20 MG tablet Take 1 tablet (20 mg total) by mouth daily.    buPROPion (WELLBUTRIN XL) 150 MG 24 hr tablet Take 1 tablet (150 mg total) by mouth every morning. Take along with 300 mg tablet=450 mg    ferrous sulfate 324 MG TBEC Take 324 mg by mouth daily  with breakfast.    hydrochlorothiazide (HYDRODIURIL) 25 MG tablet Take 1 tablet (25 mg total) by mouth daily. (Patient taking differently: Take 12.5 mg by mouth daily.)    omeprazole (PRILOSEC) 20 MG capsule Take 1 capsule (20 mg total) by mouth 2 (two) times daily before a meal. (Patient taking differently: Take 20 mg by mouth daily.) 09/30/2022: As needed per patient    traZODone (DESYREL) 50 MG tablet Take 1-2 tablets (50-100 mg total) by mouth at bedtime as needed for sleep.    Vitamin D, Ergocalciferol, (DRISDOL) 1.25 MG (50000 UNIT) CAPS capsule Take 50,000 Units by mouth every 7 (seven) days.    No facility-administered encounter medications on file as of 11/11/2022.     There were no vitals filed for this visit. There is no height or weight on file to calculate BMI.   PHYSICAL EXAM Patient appears well over the phone. Voice is strong, speech is clear. Mood/affect appear normal. No cough or conversational dyspnea  CBC    Component Value Date/Time   WBC 8.6 10/30/2022 1316   WBC 12.4 (H) 06/23/2022 1412   RBC 4.30 10/30/2022 1316   HGB 13.2 10/30/2022 1316   HGB 14.2 09/30/2022 1734   HCT 38.2 10/30/2022 1316   HCT 41.2 09/30/2022 1734   PLT 449 (H) 10/30/2022 1316   PLT 525 (H) 09/30/2022 1734   MCV 88.8 10/30/2022  1316   MCV 87 09/30/2022 1734   MCH 30.7 10/30/2022 1316   MCHC 34.6 10/30/2022 1316   RDW 16.0 (H) 10/30/2022 1316   RDW 15.7 (H) 09/30/2022 1734   LYMPHSABS 2.2 10/30/2022 1316   LYMPHSABS 3.1 09/30/2022 1734   MONOABS 0.6 10/30/2022 1316   EOSABS 0.2 10/30/2022 1316   EOSABS 0.3 09/30/2022 1734   BASOSABS 0.0 10/30/2022 1316   BASOSABS 0.1 09/30/2022 1734     CMP     Component Value Date/Time   NA 139 10/22/2022 0954   K 4.3 10/22/2022 0954   CL 101 10/22/2022 0954   CO2 22 10/22/2022 0954   GLUCOSE 108 (H) 10/22/2022 0954   GLUCOSE 91 06/23/2022 1412   BUN 7 10/22/2022 0954   CREATININE 1.16 (H) 10/22/2022 0954   CALCIUM 9.4 10/22/2022 0954    PROT 6.8 04/17/2022 0953   ALBUMIN 4.1 10/22/2022 0954   AST 29 04/17/2022 0953   ALT 35 (H) 04/17/2022 0953   ALKPHOS 71 04/17/2022 0953   BILITOT 0.3 04/17/2022 0953   GFRNONAA 57 (L) 06/23/2022 1412   GFRAA >60 06/07/2020 1541     ASSESSMENT & PLAN: 47 year old female   MPN/Essential Thrombocythemia (ET), 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 WBC range 11.1 - 16.2, with predominant neutrophils, and platelet range 429 - 611  -10/30/22 iron panel was normal. She is on oral iron for perimenopausal bleeding which is heavy at times -Further work up showed Alicia Robinson, which confirms essential thrombocythemia/MPN -ABD US showed small lesion on the spleen which is favored benign hemangioma. She has no other abd imaging in our system but reportedly had US done in [place] a few months ago, the report is in [place] which she can't translate at this time. We plan to monitor with repeat ABD [place] in 6 months  -I reviewed the work up and diagnosis with pt in detail, we discussed the increased risk of thrombosis with ET and other risk factors such as transformation to leukemia.  -I reviewed the case with Dr. Burr Medico. The recommendation is to start hydrea at low dose (last plt 449 on 2/14, no other cytopenias or CBC abnormalities) 500 mg po daily on MWF -I reviewed the potential side effects, especially fatigue, GI side effects, and cytopenias; and symptom management with pt. She agrees to proceed -Will repeat labs in 2 and 4 weeks from start, then f/up in 4 weeks  2. Atypical chest pain, h/o CVA -She had a stroke in 2016 at age 78, details are unclear whether hemorrhagic vs ischemic. She was on aspirin for 6 months then stopped. No residual deficits -She reports intermittent left chest pain, non-exertional -She went to ED in 06/2022 for this pain, work up was unremarkable.  -10/30/22 exam is benign -F/up PCP  -We discussed ET may have been the cause of her stroke, given the high risk of thrombosis with ET  3. Abnormal menses -She had normal periods until 08/2022 when her period lasted 1 month -She has fatigue and lightheadedness, possibly from blood loss or perimenopause  -She started herself on oral iron in 09/2022, tolerating well -Menstrual period in Feb was much lighter and lasted 3 days   Age-appropriate health maintenance -I encouraged her to continue age appropriate health maintenance and cancer screenings, avoid smoking/alcohol, and exercise.  -She is overdue for first screening colonoscopy. Per PCP  PLAN: -Reviewed  recent labs including MPN panel and abd [place] -JAK2 +, she has MPN/ET -Begin Hydrea 500 mg daily on Mon/Wed/Fri -Lab in 2 and 4 weeks after starting Hydrea -F/up 4 weeks after starting  -Repeat ABD [place] in 6 months  -Case reviewed with Dr. Burr Medico    All questions were answered. The patient knows to call the clinic with any problems, questions or concerns. No barriers to learning were detected. I spent 14 minutes counseling the non patient face to face. The total time spent in the appointment was 25 minutes and more than 50% was on counseling, review of test results, and coordination of care.   Cira Rue, NP-C 11/11/2022

## 2022-11-12 ENCOUNTER — Other Ambulatory Visit: Payer: Self-pay

## 2022-11-12 ENCOUNTER — Telehealth: Payer: Self-pay | Admitting: Nurse Practitioner

## 2022-11-12 NOTE — Telephone Encounter (Signed)
Contacted patient to scheduled appointments. Left message with appointment details and a call back number if patient had any questions or could not accommodate the time we provided.   

## 2022-11-20 ENCOUNTER — Other Ambulatory Visit: Payer: Self-pay

## 2022-11-20 ENCOUNTER — Encounter: Payer: Self-pay | Admitting: Critical Care Medicine

## 2022-11-20 ENCOUNTER — Ambulatory Visit: Payer: BC Managed Care – PPO | Attending: Family Medicine | Admitting: Critical Care Medicine

## 2022-11-20 VITALS — BP 116/77 | HR 76 | Temp 97.8°F | Ht 70.0 in | Wt 186.0 lb

## 2022-11-20 DIAGNOSIS — E559 Vitamin D deficiency, unspecified: Secondary | ICD-10-CM

## 2022-11-20 DIAGNOSIS — K219 Gastro-esophageal reflux disease without esophagitis: Secondary | ICD-10-CM

## 2022-11-20 DIAGNOSIS — N1831 Chronic kidney disease, stage 3a: Secondary | ICD-10-CM

## 2022-11-20 DIAGNOSIS — E785 Hyperlipidemia, unspecified: Secondary | ICD-10-CM

## 2022-11-20 DIAGNOSIS — I10 Essential (primary) hypertension: Secondary | ICD-10-CM | POA: Diagnosis not present

## 2022-11-20 DIAGNOSIS — F323 Major depressive disorder, single episode, severe with psychotic features: Secondary | ICD-10-CM | POA: Diagnosis not present

## 2022-11-20 DIAGNOSIS — F1910 Other psychoactive substance abuse, uncomplicated: Secondary | ICD-10-CM

## 2022-11-20 DIAGNOSIS — D75839 Thrombocytosis, unspecified: Secondary | ICD-10-CM

## 2022-11-20 DIAGNOSIS — Z23 Encounter for immunization: Secondary | ICD-10-CM | POA: Diagnosis not present

## 2022-11-20 DIAGNOSIS — T50905D Adverse effect of unspecified drugs, medicaments and biological substances, subsequent encounter: Secondary | ICD-10-CM

## 2022-11-20 MED ORDER — OMEPRAZOLE 20 MG PO CPDR
20.0000 mg | DELAYED_RELEASE_CAPSULE | Freq: Every day | ORAL | 2 refills | Status: DC
  Filled 2022-11-20: qty 30, 30d supply, fill #0
  Filled 2022-12-16: qty 30, 30d supply, fill #1
  Filled 2023-01-11: qty 30, 30d supply, fill #2
  Filled 2023-02-26: qty 30, 30d supply, fill #3
  Filled 2023-03-24 (×2): qty 30, 30d supply, fill #4
  Filled 2023-04-19 – 2023-04-25 (×3): qty 30, 30d supply, fill #5

## 2022-11-20 MED ORDER — HYDROCHLOROTHIAZIDE 25 MG PO TABS
12.5000 mg | ORAL_TABLET | Freq: Every day | ORAL | 2 refills | Status: DC
  Filled 2022-11-20: qty 45, 90d supply, fill #0
  Filled 2022-12-09: qty 15, 30d supply, fill #0
  Filled 2023-01-06: qty 15, 30d supply, fill #1
  Filled 2023-02-01: qty 15, 30d supply, fill #2
  Filled 2023-03-04: qty 15, 30d supply, fill #3

## 2022-11-20 MED ORDER — ATORVASTATIN CALCIUM 20 MG PO TABS
20.0000 mg | ORAL_TABLET | Freq: Every day | ORAL | 1 refills | Status: DC
  Filled 2022-11-20: qty 90, 90d supply, fill #0
  Filled 2022-12-09: qty 30, 30d supply, fill #0
  Filled 2023-01-06: qty 30, 30d supply, fill #1
  Filled 2023-02-01: qty 30, 30d supply, fill #2
  Filled 2023-03-04: qty 30, 30d supply, fill #3
  Filled 2023-03-24 – 2023-04-02 (×2): qty 30, 30d supply, fill #4
  Filled 2023-04-19 – 2023-04-29 (×2): qty 30, 30d supply, fill #5

## 2022-11-20 MED ORDER — VITAMIN D (ERGOCALCIFEROL) 1.25 MG (50000 UNIT) PO CAPS
50000.0000 [IU] | ORAL_CAPSULE | ORAL | 3 refills | Status: DC
  Filled 2022-11-20: qty 4, 28d supply, fill #0
  Filled 2022-12-16: qty 4, 28d supply, fill #1
  Filled 2023-01-11: qty 4, 28d supply, fill #2
  Filled 2023-02-01: qty 4, 28d supply, fill #3
  Filled 2023-02-03 – 2023-03-04 (×2): qty 4, 28d supply, fill #4
  Filled 2023-03-24 – 2023-03-25 (×2): qty 4, 28d supply, fill #5
  Filled 2023-04-19 – 2023-04-25 (×3): qty 4, 28d supply, fill #6

## 2022-11-20 NOTE — Assessment & Plan Note (Signed)
Continue vitamin D supplement

## 2022-11-20 NOTE — Patient Instructions (Signed)
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

## 2022-11-20 NOTE — Assessment & Plan Note (Signed)
Symptoms improved with discontinuance of bupropion

## 2022-11-20 NOTE — Assessment & Plan Note (Signed)
This has resolved.

## 2022-11-20 NOTE — Assessment & Plan Note (Signed)
Thrombocytosis follow-up per hematology currently on hydroxyurea

## 2022-11-20 NOTE — Assessment & Plan Note (Signed)
Stable chronic kidney disease will monitor

## 2022-11-20 NOTE — Progress Notes (Signed)
Established Patient Office Visit  Subjective   Patient ID: Alicia Robinson, female    DOB: 1975-09-28  Age: 47 y.o. MRN: [id number]  Chief Complaint  Patient presents with   Hypertension    htn, polyuria, polydipsia f/u. No questions / concerns.  No to flu vax.     10/2022 This is a primary care of Dr. Margarita Rana who comes in today with polydipsia and polyuria.  The patient had been seen on the mobile medicine unit several weeks ago.  Labs were not remarkable.  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  Patient notes increased thirst and she will drink a gallon of water a day.  This been going on for 6 months.  She denies nausea or vomiting.  She does have renal insufficiency as well with a GFR of 57.  Her vaginal bleeding complaint that she went to the mobile unit with is actually resolved itself.  Interestingly her mental health doctor has her on a very large dose of bupropion at 450 mg daily.  With her creatinine a typical dose is 150 mg daily.  She has not been screened for diabetes in 2 years.  She does tend to run a slightly high blood sugar.  11/20/22 Requesting change to PCP The patient was seen last month and had polyuria thought to be due to bupropion bupropion was stopped polyuria has resolved.  Patient comes in today requesting to change providers to my clinic.  She has seen hematology and has been diagnosed with essential thrombocytopenia/M PN  Below is documentation of the last hematology visit. Saw [facility] for thrombocythemia 10/2022 1. 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 WBC range 11.1 - 16.2, with predominant neutrophils, and platelet range 429 - 611  -10/30/22 iron panel was normal. She is on oral iron for perimenopausal bleeding which is  heavy at times -Further work up showed [place], which confirms essential thrombocythemia/MPN -ABD US showed small lesion on the spleen which is favored benign hemangioma. She has no other abd imaging in our system but reportedly had [place] done [place] a few months ago, the report is in [place] which she can't translate at this time. We plan to monitor with repeat ABD [place] in 6 months  -I reviewed the work up and diagnosis with pt in detail, we discussed the increased risk of thrombosis with ET and other risk factors such as transformation to leukemia.  -I reviewed the case with Dr. Burr Medico. The recommendation is to start hydrea at low dose (last plt 449 on 2/14, no other cytopenias or CBC abnormalities) 500 mg po daily on MWF -I reviewed the potential side effects, especially fatigue, GI side effects, and cytopenias; and symptom management with pt. She agrees to proceed -Will repeat labs in 2 and 4 weeks from start, then f/up in 4 weeks   2. Atypical chest pain, h/o CVA -She had a stroke in 2016 at age 58, details are unclear whether hemorrhagic vs ischemic. She was on aspirin for 6 months then stopped. No residual deficits -She reports intermittent left chest pain, non-exertional -She went to ED in 06/2022 for this pain, work up was unremarkable. -10/30/22 exam is benign -F/up PCP  -We discussed ET may have been the cause of her stroke, given the high risk of thrombosis  with ET   3. Abnormal menses -She had normal periods until 08/2022 when her period lasted 1 month -She has fatigue and lightheadedness, possibly from blood loss or perimenopause  -She started herself on oral iron in 09/2022, tolerating well -Menstrual period in Feb was much lighter and lasted 3 days   4. Age-appropriate health maintenance -I encouraged her to continue age appropriate health maintenance and cancer screenings, avoid smoking/alcohol, and exercise.  -She is overdue for first screening colonoscopy. Per  PCP   PLAN: -Reviewed recent labs including MPN panel and abd [place] -JAK2 +, she has MPN/ET -Begin Hydrea 500 mg daily on Mon/Wed/Fri -Lab in 2 and 4 weeks after starting Hydrea -F/up 4 weeks after starting  -Repeat ABD [place] in 6 months  -Case reviewed with Dr. Burr Medico   The patient is now on hydroxyurea 3 times weekly.  Follow-up labs are ordered.  Patient also needs a Pap smear to be scheduled.  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  The patient is followed by behavioral health and they were notified of the discontinuance of bupropion.  She does have follow-up with them.  Mental health is stable at this time.       Review of Systems  Constitutional:  Negative for chills, diaphoresis, fever, malaise/fatigue and weight loss.  HENT:  Negative for congestion, ear discharge, ear pain, hearing loss, nosebleeds, sore throat and tinnitus.   Eyes:  Negative for blurred vision, double vision, photophobia and discharge.  Respiratory:  Negative for cough, hemoptysis, sputum production, shortness of breath, wheezing and stridor.        No excess mucus  Cardiovascular:  Negative for chest pain, palpitations, orthopnea, claudication, leg swelling and PND.  Gastrointestinal:  Negative for abdominal pain, blood in stool, constipation, diarrhea, heartburn, melena, nausea and vomiting.  Genitourinary:  Negative for dysuria, flank pain, frequency, hematuria and urgency.       Polyuria flank pain on the right  Musculoskeletal:  Negative for back pain, falls, joint pain, myalgias and neck pain.  Skin:  Negative for itching and rash.  Neurological:  Negative for dizziness, tingling, tremors, sensory change, speech change, focal weakness, seizures, loss of consciousness, weakness and headaches.  Endo/Heme/Allergies:  Negative for environmental allergies and polydipsia. Does not bruise/bleed easily.  Psychiatric/Behavioral:   Negative for depression, hallucinations, memory loss, substance abuse and suicidal ideas. The patient is not nervous/anxious and does not have insomnia.   All other systems reviewed and are negative.     Objective:     BP 116/77 (BP Location: Left Arm, Patient Position: Sitting, Cuff Size: Normal)   Pulse 76   Temp 97.8 F (36.6 C) (Oral)   Ht '5\' 10"'$  (1.778 m)   Wt 186 lb (84.4 kg)   SpO2 100%   BMI 26.69 kg/m    Physical Exam Vitals reviewed.  Constitutional:      Appearance: Normal appearance. She is well-developed. She is not diaphoretic.  HENT:     Head: Normocephalic and atraumatic.     Nose: Nose normal. No nasal deformity, septal deviation, mucosal edema or rhinorrhea.     Right Sinus: No maxillary sinus tenderness or frontal sinus tenderness.     Left Sinus: No maxillary sinus tenderness or frontal sinus tenderness.     Mouth/Throat:     Mouth: Mucous membranes are moist.     Pharynx: Oropharynx is clear. No oropharyngeal exudate.  Eyes:  General: No scleral icterus.    Conjunctiva/sclera: Conjunctivae normal.     Pupils: Pupils are equal, round, and reactive to light.  Neck:     Thyroid: No thyromegaly.     Vascular: No carotid bruit or JVD.     Trachea: Trachea normal. No tracheal tenderness or tracheal deviation.  Cardiovascular:     Rate and Rhythm: Normal rate and regular rhythm.     Chest Wall: PMI is not displaced.     Pulses: Normal pulses. No decreased pulses.     Heart sounds: Normal heart sounds, S1 normal and S2 normal. Heart sounds not distant. No murmur heard.    No systolic murmur is present.     No diastolic murmur is present.     No friction rub. No gallop. No S3 or S4 sounds.  Pulmonary:     Effort: Pulmonary effort is normal. No tachypnea, accessory muscle usage or respiratory distress.     Breath sounds: Normal breath sounds. No stridor. No decreased breath sounds, wheezing, rhonchi or rales.  Chest:     Chest wall: No tenderness.   Abdominal:     General: Bowel sounds are normal. There is no distension.     Palpations: Abdomen is soft. Abdomen is not rigid.     Tenderness: There is no abdominal tenderness. There is no right CVA tenderness, guarding or rebound.  Musculoskeletal:        General: Normal range of motion.     Cervical back: Normal range of motion and neck supple. No edema, erythema or rigidity. No muscular tenderness. Normal range of motion.  Lymphadenopathy:     Head:     Right side of head: No submental or submandibular adenopathy.     Left side of head: No submental or submandibular adenopathy.     Cervical: No cervical adenopathy.  Skin:    General: Skin is warm and dry.     Coloration: Skin is not pale.     Findings: No rash.     Nails: There is no clubbing.     Comments: Normal skin turgor  Neurological:     Mental Status: She is alert and oriented to person, place, and time.     Sensory: No sensory deficit.  Psychiatric:        Mood and Affect: Mood normal.        Speech: Speech normal.        Behavior: Behavior normal.        Thought Content: Thought content normal.        Judgment: Judgment normal.      No results found for any visits on 11/20/22.    The 10-year ASCVD risk score (Arnett DK, et al., 2019) is: 3.6%    Assessment & Plan:   Problem List Items Addressed This Visit       Cardiovascular and Mediastinum   HTN (hypertension)    Hypertension currently at goal on 12 and half milligram hydrochlorothiazide daily will maintain same      Relevant Medications   atorvastatin (LIPITOR) 20 MG tablet   hydrochlorothiazide (HYDRODIURIL) 25 MG tablet     Digestive   GERD (gastroesophageal reflux disease)   Relevant Medications   omeprazole (PRILOSEC) 20 MG capsule     Genitourinary   Stage 3a chronic kidney disease (HCC)    Stable chronic kidney disease will monitor        Hematopoietic and Hemostatic   Thrombocytosis    Thrombocytosis follow-up per hematology  currently on hydroxyurea        Other   MDD (major depressive disorder), single episode, severe with psychosis ([place])   Hyperlipidemia   Relevant Medications   atorvastatin (LIPITOR) 20 MG tablet   hydrochlorothiazide (HYDRODIURIL) 25 MG tablet   Vitamin D insufficiency    Continue vitamin D supplement      RESOLVED: Drug reaction    Symptoms improved with discontinuance of bupropion      RESOLVED: Other psychoactive substance abuse, uncomplicated ([place]) - Primary    This has resolved      Other Visit Diagnoses     Essential hypertension       Relevant Medications   atorvastatin (LIPITOR) 20 MG tablet   hydrochlorothiazide (HYDRODIURIL) 25 MG tablet     Return in about 2 months (around 01/20/2023) for CPE.    Asencion Noble, MD

## 2022-11-20 NOTE — Assessment & Plan Note (Signed)
Hypertension currently at goal on 12 and half milligram hydrochlorothiazide daily will maintain same

## 2022-11-24 ENCOUNTER — Emergency Department (HOSPITAL_COMMUNITY): Payer: BC Managed Care – PPO

## 2022-11-24 ENCOUNTER — Encounter (HOSPITAL_COMMUNITY): Payer: Self-pay | Admitting: Emergency Medicine

## 2022-11-24 ENCOUNTER — Emergency Department (HOSPITAL_COMMUNITY)
Admission: EM | Admit: 2022-11-24 | Discharge: 2022-11-24 | Disposition: A | Discharge status: Home / Self Care | Payer: BC Managed Care – PPO | Attending: Emergency Medicine | Admitting: Emergency Medicine

## 2022-11-24 ENCOUNTER — Other Ambulatory Visit: Payer: Self-pay

## 2022-11-24 DIAGNOSIS — D72829 Elevated white blood cell count, unspecified: Secondary | ICD-10-CM | POA: Insufficient documentation

## 2022-11-24 DIAGNOSIS — R42 Dizziness and giddiness: Secondary | ICD-10-CM | POA: Insufficient documentation

## 2022-11-24 DIAGNOSIS — R079 Chest pain, unspecified: Secondary | ICD-10-CM | POA: Insufficient documentation

## 2022-11-24 DIAGNOSIS — I129 Hypertensive chronic kidney disease with stage 1 through stage 4 chronic kidney disease, or unspecified chronic kidney disease: Secondary | ICD-10-CM | POA: Insufficient documentation

## 2022-11-24 DIAGNOSIS — N183 Chronic kidney disease, stage 3 unspecified: Secondary | ICD-10-CM | POA: Insufficient documentation

## 2022-11-24 DIAGNOSIS — Z79899 Other long term (current) drug therapy: Secondary | ICD-10-CM | POA: Insufficient documentation

## 2022-11-24 LAB — CBG MONITORING, ED: Glucose-Capillary: 112 mg/dL — ABNORMAL HIGH (ref 70–99)

## 2022-11-24 LAB — URINALYSIS, ROUTINE W REFLEX MICROSCOPIC
Bilirubin Urine: NEGATIVE
Glucose, UA: NEGATIVE mg/dL
Hgb urine dipstick: NEGATIVE
Ketones, ur: NEGATIVE mg/dL
Leukocytes,Ua: NEGATIVE
Nitrite: NEGATIVE
Protein, ur: NEGATIVE mg/dL
Specific Gravity, Urine: 1.009 (ref 1.005–1.030)
pH: 6 (ref 5.0–8.0)

## 2022-11-24 LAB — CBC
HCT: 41.1 % (ref 36.0–46.0)
Hemoglobin: 14.3 g/dL (ref 12.0–15.0)
MCH: 31.5 pg (ref 26.0–34.0)
MCHC: 34.8 g/dL (ref 30.0–36.0)
MCV: 90.5 fL (ref 80.0–100.0)
Platelets: 545 10*3/uL — ABNORMAL HIGH (ref 150–400)
RBC: 4.54 MIL/uL (ref 3.87–5.11)
RDW: 15.5 % (ref 11.5–15.5)
WBC: 11 10*3/uL — ABNORMAL HIGH (ref 4.0–10.5)
nRBC: 0 % (ref 0.0–0.2)

## 2022-11-24 LAB — I-STAT BETA HCG BLOOD, ED (MC, WL, AP ONLY): I-stat hCG, quantitative: <5 m[IU]/mL (ref <5)

## 2022-11-24 LAB — BASIC METABOLIC PANEL
Anion gap: 12 (ref 5–15)
BUN: 18 mg/dL (ref 6–20)
CO2: 25 mmol/L (ref 22–32)
Calcium: 9.5 mg/dL (ref 8.9–10.3)
Chloride: 100 mmol/L (ref 98–111)
Creatinine, Ser: 1.28 mg/dL — ABNORMAL HIGH (ref 0.44–1.00)
GFR, Estimated: 52 mL/min — ABNORMAL LOW (ref >60)
Glucose, Bld: 100 mg/dL — ABNORMAL HIGH (ref 70–99)
Potassium: 3.7 mmol/L (ref 3.5–5.1)
Sodium: 137 mmol/L (ref 135–145)

## 2022-11-24 LAB — TROPONIN I (HIGH SENSITIVITY)
Troponin I (High Sensitivity): 4 ng/L (ref <18)
Troponin I (High Sensitivity): 5 ng/L (ref <18)

## 2022-11-24 MED ORDER — LACTATED RINGERS IV BOLUS
1000.0000 mL | Freq: Once | INTRAVENOUS | Status: AC
  Administered 2022-11-24: 1000 mL via INTRAVENOUS

## 2022-11-24 NOTE — ED Triage Notes (Addendum)
Pt to ED via triage c/o chest pain for a couple of hours, reports intermittent for weeks. Also c/o dizziness. No LOC. Currently denies chest pain. C/O HA, Reports increase in stress recently. No s/s of acute distress noted in triage.

## 2022-11-24 NOTE — ED Notes (Signed)
Patient verbalizes understanding of discharge instructions. Opportunity for questioning and answers were provided. Armband removed by staff, pt discharged from ED. Pt ambulatory to ED waiting room with steady gait.  

## 2022-11-24 NOTE — ED Provider Notes (Signed)
[facility] Provider Note   CSN: [id number] Arrival date & time: 11/24/22  1613     History  Chief Complaint  Patient presents with   Chest Pain   Dizziness    Alicia Robinson is a 47 y.o. female.  This is a 47 year old female with history of essential thrombocythemia followed by hematology, hypertension, hyperlipidemia, and stage III kidney disease presenting to the ED for chest pain and dizziness.  Patient states this morning she started having some black spots in her vision, some pain in her left arm and some paresthesias in her left foot.  She then subsequently experienced left-sided chest pain which she states is intermittent and she has experienced before.  She denies any slurring of her words or aphasia, no shortness of breath, abdominal pain, nausea or vomiting.  Her left foot paresthesias have improved and her left arm pain is also improving, she is still experiencing some left-sided chest pain over her breast which does not radiate and has no associated symptoms.     Home Medications Prior to Admission medications   Medication Sig Start Date End Date Taking? Authorizing Provider  ascorbic acid (VITAMIN C) 500 MG tablet Take 500 mg by mouth daily.    [provider]  atorvastatin (LIPITOR) 20 MG tablet Take 1 tablet (20 mg total) by mouth daily. 11/20/22   Elsie Stain, MD  ferrous sulfate 324 MG TBEC Take 324 mg by mouth daily with breakfast.    [provider]  hydrochlorothiazide (HYDRODIURIL) 25 MG tablet Take 0.5 tablets (12.5 mg total) by mouth daily. 11/20/22   Elsie Stain, MD  hydroxyurea (HYDREA) 500 MG capsule Take 1 capsule daily on Mondays, Wednesdays, and Fridays. May take with food to minimize GI side effects. 11/11/22   Alla Feeling, NP  omeprazole (PRILOSEC) 20 MG capsule Take 1 capsule (20 mg total) by mouth daily. 11/20/22   Elsie Stain, MD  traZODone (DESYREL) 50 MG tablet Take 1-2  tablets (50-100 mg total) by mouth at bedtime as needed for sleep. 09/05/22 09/05/23  [name], MD  Vitamin D, Ergocalciferol, (DRISDOL) 1.25 MG (50000 UNIT) CAPS capsule Take 1 capsule (50,000 Units total) by mouth every 7 (seven) days. 11/20/22   Elsie Stain, MD      Allergies    Bupropion and Nsaids    Review of Systems   Review of Systems  Constitutional:  Negative for chills and fever.  Eyes:  Positive for visual disturbance.  Respiratory:  Negative for cough and shortness of breath.   Cardiovascular:  Positive for chest pain. Negative for palpitations.  Gastrointestinal:  Negative for abdominal pain.  Neurological:  Negative for weakness and numbness.    Physical Exam Updated Vital Signs BP (!) 141/88   Pulse 80   Temp 98.3 F (36.8 C)   Resp 18   Ht '5\' 10"'$  (1.778 m)   Wt 84.4 kg   LMP 10/30/2022   SpO2 100%   BMI 26.69 kg/m  Physical Exam Vitals and nursing note reviewed.  Constitutional:      General: She is not in acute distress.    Appearance: She is well-developed.  HENT:     Head: Normocephalic and atraumatic.  Eyes:     Conjunctiva/sclera: Conjunctivae normal.  Cardiovascular:     Rate and Rhythm: Normal rate and regular rhythm.     Heart sounds: No murmur heard. Pulmonary:     Effort: Pulmonary effort  is normal. No respiratory distress.     Breath sounds: Normal breath sounds.  Abdominal:     Palpations: Abdomen is soft.     Tenderness: There is no abdominal tenderness.  Musculoskeletal:        General: No swelling.     Cervical back: Neck supple.     Right lower leg: No tenderness. No edema.     Left lower leg: No tenderness. No edema.  Skin:    General: Skin is warm and dry.     Capillary Refill: Capillary refill takes less than 2 seconds.  Neurological:     General: No focal deficit present.     Mental Status: She is alert and oriented to person, place, and time.     Cranial Nerves: No cranial nerve deficit.     Motor: No  weakness.  Psychiatric:        Mood and Affect: Mood normal.     ED Results / Procedures / Treatments   Labs (all labs ordered are listed, but only abnormal results are displayed) Labs Reviewed  CBG MONITORING, ED - Abnormal; Notable for the following components:      Result Value   Glucose-Capillary 112 (*)    All other components within normal limits  BASIC METABOLIC PANEL  CBC  URINALYSIS, ROUTINE W REFLEX MICROSCOPIC  I-STAT BETA HCG BLOOD, ED ([facility], [facility], [facility] ONLY)  TROPONIN I (HIGH SENSITIVITY)    EKG None  Radiology No results found.  Procedures Procedures    Medications Ordered in ED Medications - No data to display  ED Course/ Medical Decision Making/ A&P                             Medical Decision Making Patient presents overall well-appearing, no acute distress.  She is here for some chest pain and lightheadedness, she has sporadic symptoms such as paresthesias in her left foot and mild arm discomfort, her visual changes have improved.  Differential diagnosis includes TIA, ICH, ACS, complex migraine, pneumothorax, pleural effusion, hyperviscosity syndrome.  Will obtain a CT head to evaluate for any intracranial hemorrhage or other acute findings.  I have low suspicion for TIA given patient's symptoms are nonlocalizing, she had bilateral vision changes with dorsal foot paresthesia which do not typically localize to any specific part.  Will get serial troponins and EKG to evaluate for ACS, chest x-ray ordered to evaluate for pneumothorax or pleural effusion.  CBC sent to evaluate patient's platelet count due to her essential thrombocythemia.  Patient given 1 L LR.  I personally reviewed and interpreted patient's labs and EKG, she has a stable creatinine of 1.28, baseline appears around 1.18, electrolytes otherwise unremarkable.  Troponins 4 and 5 respectively, low suspicion for ACS given her nonischemic EKG which shows sinus rhythm with rate 77, QRS, PR, QTc normal, no  acute ischemic changes. White count mildly elevated at 11, patient has persistently elevated white counts as her previous one was 11.8.  She does have elevated placed at 545, she also has elevated platelets prior 05 25 which appears to be close to her baseline.  Urinalysis noninfectious, urine pregnancy negative.    I personally reviewed and interpreted patient's head CT, no intracranial hemorrhage,  no large territory infarction, no acute findings.  Chest x-ray shows no cardiomegaly and no pulmonary edema.  I discussed with patient we are unsure of the exact cause of her symptoms today.  We still feel TIA  is unlikely, she has no focal neurologic deficits on repeat exam.  Her platelet count is similar to prior, she does not have an increase in her red blood cells or other cell lines, she has no focal neurologic deficits and neurologic symptoms have resolved, low suspicion for hyperviscosity syndrome.  We recommended patient follow-up with her PCP and hematologist outpatient for further workup and management.  Return precautions given if she is having worsening chest pain, dizziness, hemibody weakness or shortness of breath.  Patient was stable at discharge.  Problems Addressed: Chest pain, unspecified type: acute illness or injury that poses a threat to life or bodily functions Dizziness: acute illness or injury that poses a threat to life or bodily functions  Amount and/or Complexity of Data Reviewed Labs: ordered. Decision-making details documented in ED Course. Radiology: ordered and independent interpretation performed. Decision-making details documented in ED Course. ECG/medicine tests: ordered and independent interpretation performed. Decision-making details documented in ED Course.          Final Clinical Impression(s) / ED Diagnoses Final diagnoses:  None    Rx / DC Orders ED Discharge Orders     None         Jimmie Molly, MD 11/24/22 2024    Elnora Morrison,  MD 11/27/22 [id number]

## 2022-11-24 NOTE — Discharge Instructions (Signed)
You were evaluated in the emergency department, your head CT looks great, your labs also looked good, your platelet count was mildly elevated above your baseline at 500.  I gave you fluids which should help.  I recommend you follow-up with your hematologist and PCP outpatient in the next 2 to 3 days for reevaluation.  Return to the ED if you have worsening chest pain, dizziness

## 2022-11-25 ENCOUNTER — Telehealth: Payer: Self-pay | Admitting: Critical Care Medicine

## 2022-11-25 ENCOUNTER — Inpatient Hospital Stay: Payer: BC Managed Care – PPO | Attending: Nurse Practitioner

## 2022-11-25 ENCOUNTER — Other Ambulatory Visit: Payer: Self-pay

## 2022-11-25 ENCOUNTER — Other Ambulatory Visit: Payer: Self-pay | Admitting: Nurse Practitioner

## 2022-11-25 DIAGNOSIS — I10 Essential (primary) hypertension: Secondary | ICD-10-CM | POA: Insufficient documentation

## 2022-11-25 DIAGNOSIS — D473 Essential (hemorrhagic) thrombocythemia: Secondary | ICD-10-CM

## 2022-11-25 DIAGNOSIS — R0789 Other chest pain: Secondary | ICD-10-CM | POA: Diagnosis not present

## 2022-11-25 DIAGNOSIS — Z8673 Personal history of transient ischemic attack (TIA), and cerebral infarction without residual deficits: Secondary | ICD-10-CM | POA: Diagnosis not present

## 2022-11-25 DIAGNOSIS — D75839 Thrombocytosis, unspecified: Secondary | ICD-10-CM | POA: Insufficient documentation

## 2022-11-25 LAB — CBC WITH DIFFERENTIAL (CANCER CENTER ONLY)
Abs Immature Granulocytes: 0.03 10*3/uL (ref 0.00–0.07)
Basophils Absolute: 0.1 10*3/uL (ref 0.0–0.1)
Basophils Relative: 1 %
Eosinophils Absolute: 0.3 10*3/uL (ref 0.0–0.5)
Eosinophils Relative: 3 %
HCT: 40.6 % (ref 36.0–46.0)
Hemoglobin: 13.9 g/dL (ref 12.0–15.0)
Immature Granulocytes: 0 %
Lymphocytes Relative: 23 %
Lymphs Abs: 2.3 10*3/uL (ref 0.7–4.0)
MCH: 31.2 pg (ref 26.0–34.0)
MCHC: 34.2 g/dL (ref 30.0–36.0)
MCV: 91.2 fL (ref 80.0–100.0)
Monocytes Absolute: 0.7 10*3/uL (ref 0.1–1.0)
Monocytes Relative: 8 %
Neutro Abs: 6.4 10*3/uL (ref 1.7–7.7)
Neutrophils Relative %: 65 %
Platelet Count: 474 10*3/uL — ABNORMAL HIGH (ref 150–400)
RBC: 4.45 MIL/uL (ref 3.87–5.11)
RDW: 15.6 % — ABNORMAL HIGH (ref 11.5–15.5)
WBC Count: 9.8 10*3/uL (ref 4.0–10.5)
nRBC: 0 % (ref 0.0–0.2)

## 2022-11-25 LAB — CMP (CANCER CENTER ONLY)
ALT: 46 U/L — ABNORMAL HIGH (ref 0–44)
AST: 26 U/L (ref 15–41)
Albumin: 4.1 g/dL (ref 3.5–5.0)
Alkaline Phosphatase: 57 U/L (ref 38–126)
Anion gap: 7 (ref 5–15)
BUN: 23 mg/dL — ABNORMAL HIGH (ref 6–20)
CO2: 28 mmol/L (ref 22–32)
Calcium: 9.2 mg/dL (ref 8.9–10.3)
Chloride: 104 mmol/L (ref 98–111)
Creatinine: 1.05 mg/dL — ABNORMAL HIGH (ref 0.44–1.00)
GFR, Estimated: >60 mL/min (ref >60)
Glucose, Bld: 147 mg/dL — ABNORMAL HIGH (ref 70–99)
Potassium: 3.8 mmol/L (ref 3.5–5.1)
Sodium: 139 mmol/L (ref 135–145)
Total Bilirubin: 0.5 mg/dL (ref 0.3–1.2)
Total Protein: 6.6 g/dL (ref 6.5–8.1)

## 2022-11-25 NOTE — Telephone Encounter (Signed)
Pt is calling to schedule a Hospital Follow up within 2 days. Please advise CB- [phone]

## 2022-11-26 ENCOUNTER — Encounter (HOSPITAL_COMMUNITY): Payer: Self-pay

## 2022-11-26 NOTE — Telephone Encounter (Signed)
Patient has first available appointment with [facility] for HFU 12/05/2022

## 2022-11-27 ENCOUNTER — Ambulatory Visit: Payer: BC Managed Care – PPO | Admitting: Physician Assistant

## 2022-11-27 NOTE — Progress Notes (Deleted)
Patient ID: Alicia Robinson, female   DOB: 16-Apr-1976, 47 y.o.   MRN: [id number]   After ED visit 3/313/2024 for dizziness and CP.  From ED note: Medical Decision Making Patient presents overall well-appearing, no acute distress.  She is here for some chest pain and lightheadedness, she has sporadic symptoms such as paresthesias in her left foot and mild arm discomfort, her visual changes have improved.  Differential diagnosis includes TIA, ICH, ACS, complex migraine, pneumothorax, pleural effusion, hyperviscosity syndrome.  Will obtain a CT head to evaluate for any intracranial hemorrhage or other acute findings.  I have low suspicion for TIA given patient's symptoms are nonlocalizing, she had bilateral vision changes with dorsal foot paresthesia which do not typically localize to any specific part.  Will get serial troponins and EKG to evaluate for ACS, chest x-ray ordered to evaluate for pneumothorax or pleural effusion.  CBC sent to evaluate patient's platelet count due to her essential thrombocythemia.  Patient given 1 L LR.   I personally reviewed and interpreted patient's labs and EKG, she has a stable creatinine of 1.28, baseline appears around 1.18, electrolytes otherwise unremarkable.  Troponins 4 and 5 respectively, low suspicion for ACS given her nonischemic EKG which shows sinus rhythm with rate 77, QRS, PR, QTc normal, no acute ischemic changes. White count mildly elevated at 11, patient has persistently elevated white counts as her previous one was 11.8.  She does have elevated placed at 545, she also has elevated platelets prior 05 25 which appears to be close to her baseline.  Urinalysis noninfectious, urine pregnancy negative.     I personally reviewed and interpreted patient's head CT, no intracranial hemorrhage,  no large territory infarction, no acute findings.  Chest x-ray shows no cardiomegaly and no pulmonary edema.   I discussed with patient we are unsure of the exact cause of her  symptoms today.  We still feel TIA is unlikely, she has no focal neurologic deficits on repeat exam.  Her platelet count is similar to prior, she does not have an increase in her red blood cells or other cell lines, she has no focal neurologic deficits and neurologic symptoms have resolved, low suspicion for hyperviscosity syndrome.  We recommended patient follow-up with her PCP and hematologist outpatient for further workup and management.  Return precautions given if she is having worsening chest pain, dizziness, hemibody weakness or shortness of breath.   Patient was stable at discharge.   Problems Addressed: Chest pain, unspecified type: acute illness or injury that poses a threat to life or bodily functions Dizziness: acute illness or injury that poses a threat to life or bodily functions   Amount and/or Complexity of Data Reviewed Labs: ordered. Decision-making details documented in ED Course. Radiology: ordered and independent interpretation performed. Decision-making details documented in ED Course. ECG/medicine tests: ordered and independent interpretation performed. Decision-making details documented in ED Course.

## 2022-11-29 ENCOUNTER — Telehealth: Payer: Self-pay

## 2022-11-29 NOTE — Telephone Encounter (Addendum)
Relayed message below as per Cira Rue NP patient had full understanding.   ----- Message from Alla Feeling, NP sent at 11/28/2022 12:22 PM EDT ----- Please confirm pt is taking Hydrea 500 mg every MWF, continue same dose, and keep lab and f/up with me on 3/25.  Thanks, Regan Rakers NP

## 2022-12-05 ENCOUNTER — Ambulatory Visit: Payer: BC Managed Care – PPO | Admitting: Physician Assistant

## 2022-12-08 NOTE — Progress Notes (Signed)
Patient Care Team: [name], MD as PCP - General (Pulmonary Disease)   CHIEF COMPLAINT: Follow up essential thrombocythemia (ET)   CURRENT THERAPY: Hydrea 500 mg 3 x per week (MWF), starting 11/14/22  INTERVAL HISTORY Ms. Ciarcia returns for follow up as scheduled. Last seen by me virtually 11/11/22 to begin hydrea 500 mg MWF. She presented to ED 11/24/22 with chest pain, dizziness, and left foot paresthesias.  Work up was unremarkable. She continues to have intermittent chest pain. She continues Hydrea, tolerating well. Her appetite has increased and gained weight. Bruises easily without bleeding. Denies signs of thrombosis. She was invited to go to [place] July 10th. Asking questions about family testing for ET.  ROS  All other systems reviewed and negative   Past Medical History:  Diagnosis Date   Anxiety    Depression    Drug reaction 10/22/2022   Hyperlipidemia    Hypertension    Stroke [facility]) 2016   "mild"   Suicidal overdose (HCC)      Past Surgical History:  Procedure Laterality Date   CHOLECYSTECTOMY     SIGMOIDOSCOPY     pt is unsure about this   TUBAL LIGATION     UPPER GASTROINTESTINAL ENDOSCOPY       Outpatient Encounter Medications as of 12/09/2022  Medication Sig   ascorbic acid (VITAMIN C) 500 MG tablet Take 500 mg by mouth daily.   atorvastatin (LIPITOR) 20 MG tablet Take 1 tablet (20 mg total) by mouth daily.   ferrous sulfate 324 MG TBEC Take 324 mg by mouth daily with breakfast.   hydrochlorothiazide (HYDRODIURIL) 25 MG tablet Take 0.5 tablets (12.5 mg total) by mouth daily.   omeprazole (PRILOSEC) 20 MG capsule Take 1 capsule (20 mg total) by mouth daily.   traZODone (DESYREL) 50 MG tablet Take 1-2 tablets (50-100 mg total) by mouth at bedtime as needed for sleep.   Vitamin D, Ergocalciferol, (DRISDOL) 1.25 MG (50000 UNIT) CAPS capsule Take 1 capsule (50,000 Units total) by mouth every 7 (seven) days.   [DISCONTINUED] hydroxyurea (HYDREA) 500 MG  capsule Take 1 capsule daily on Mondays, Wednesdays, and Fridays. May take with food to minimize GI side effects.   hydroxyurea (HYDREA) 500 MG capsule Take 1 capsule (500 mg total) by mouth every Monday, Wednesday, and Friday.May take with food to minimize GI side effects.   No facility-administered encounter medications on file as of 12/09/2022.     Today's Vitals   12/09/22 1114  BP: 127/82  Pulse: 65  Resp: 18  Temp: 98.3 F (36.8 C)  TempSrc: Oral  SpO2: 100%  Weight: 200 lb 14.4 oz (91.1 kg)  Height: 5\' 10"  (1.778 m)   Body mass index is 28.83 kg/m.    PHYSICAL EXAM GENERAL:alert, no distress and comfortable SKIN: no rash  EYES: sclera clear NECK: without mass LUNGS: clear with normal breathing effort HEART: regular rate & rhythm, no lower extremity edema ABDOMEN: abdomen soft, non-tender and normal bowel sounds NEURO: alert & oriented x 3 with fluent speech, no focal motor/sensory deficits   CBC    Component Value Date/Time   WBC 8.3 12/09/2022 1058   WBC 11.0 (H) 11/24/2022 1627   RBC 3.80 (L) 12/09/2022 1058   HGB 12.1 12/09/2022 1058   HGB 14.2 09/30/2022 1734   HCT 35.9 (L) 12/09/2022 1058   HCT 41.2 09/30/2022 1734   PLT 419 (H) 12/09/2022 1058   PLT 525 (H) 09/30/2022 1734   MCV 94.5 12/09/2022 1058  MCV 87 09/30/2022 1734   MCH 31.8 12/09/2022 1058   MCHC 33.7 12/09/2022 1058   RDW 15.9 (H) 12/09/2022 1058   RDW 15.7 (H) 09/30/2022 1734   LYMPHSABS 2.4 12/09/2022 1058   LYMPHSABS 3.1 09/30/2022 1734   MONOABS 0.8 12/09/2022 1058   EOSABS 0.2 12/09/2022 1058   EOSABS 0.3 09/30/2022 1734   BASOSABS 0.1 12/09/2022 1058   BASOSABS 0.1 09/30/2022 1734     CMP     Component Value Date/Time   NA 140 12/09/2022 1058   NA 139 10/22/2022 0954   K 3.9 12/09/2022 1058   CL 105 12/09/2022 1058   CO2 31 12/09/2022 1058   GLUCOSE 89 12/09/2022 1058   BUN 16 12/09/2022 1058   BUN 7 10/22/2022 0954   CREATININE 1.00 12/09/2022 1058   CALCIUM 9.0  12/09/2022 1058   PROT 5.9 (L) 12/09/2022 1058   PROT 6.8 04/17/2022 0953   ALBUMIN 3.8 12/09/2022 1058   ALBUMIN 4.1 10/22/2022 0954   AST 21 12/09/2022 1058   ALT 36 12/09/2022 1058   ALKPHOS 52 12/09/2022 1058   BILITOT 0.3 12/09/2022 1058   GFRNONAA >60 12/09/2022 1058   GFRAA >60 06/07/2020 1541     ASSESSMENT & PLAN:47 year old female   MPN/Essential Thrombocythemia (ET), 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 WBC range 11.1 - 16.2, with predominant neutrophils, and platelet range 429 - 611  -10/30/22 iron panel was normal. 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 She has no other abd imaging in our system but reportedly had US done in [place] a few months ago, the report is in [place] which she can't translate at this time. We plan to monitor with repeat ABD [place] in 6 months  -She began low dose hydrea 500 mg po daily on MWF, starting 11/13/22. Tolerating well except weight gain -Ms. Blumstein appears stable. Plt improved from 449 to 419 on hydrea so far, hgb also decreasing but no anemia yet. Will continue same dose -No signs of thrombosis. She would like to go to [place] in July. We reviewed risk reducing behaviors. -Lab monthly, f/up in 3 months    2. Atypical chest pain, h/o CVA -She had a stroke in 2016 at age 39, details are unclear whether hemorrhagic vs ischemic. She was on aspirin for 6 months then stopped. No residual deficits -She reports intermittent left chest pain, non-exertional -She went to ED in 06/2022 for this pain, work up was unremarkable.  -10/30/22 exam is benign -F/up PCP  -We discussed ET may have been the cause of her stroke, given the high risk  of thrombosis with ET -She continues to have intermittent chest pain, seen in ED 11/24/22, work up was unremarkable. I offered cardiology referral and she agreed.   3. Abnormal menses -She had normal periods until 08/2022 when her period lasted 1 month -She has fatigue and lightheadedness, possibly from blood loss or perimenopause  -She started herself on oral iron in 09/2022, tolerating well -Menstrual period in Feb was much lighter and lasted 3 days   Age-appropriate health maintenance -I encouraged her to continue age appropriate health maintenance and cancer screenings, avoid smoking/alcohol, and exercise.  -She is overdue for first screening colonoscopy. Per  PCP  PLAN: -Labs reviewed -Continue low dose hydrea 500 mg on MWF (3x per week) -Discussed testing for family members and thrombosis precautions -Lab monthly and f/up in 3 months     All questions were answered. The patient knows to call the clinic with any problems, questions or concerns. No barriers to learning were detected. I spent 20 minutes counseling the patient face to face. The total time spent in the appointment was 30 minutes and more than 50% was on counseling, review of test results, and coordination of care.   Santiago Glad, NP-C 12/09/2022

## 2022-12-09 ENCOUNTER — Inpatient Hospital Stay: Payer: BC Managed Care – PPO

## 2022-12-09 ENCOUNTER — Inpatient Hospital Stay (HOSPITAL_BASED_OUTPATIENT_CLINIC_OR_DEPARTMENT_OTHER): Payer: BC Managed Care – PPO | Admitting: Nurse Practitioner

## 2022-12-09 ENCOUNTER — Other Ambulatory Visit: Payer: Self-pay

## 2022-12-09 ENCOUNTER — Encounter: Payer: Self-pay | Admitting: Nurse Practitioner

## 2022-12-09 VITALS — BP 127/82 | HR 65 | Temp 98.3°F | Resp 18 | Ht 70.0 in | Wt 200.9 lb

## 2022-12-09 DIAGNOSIS — D473 Essential (hemorrhagic) thrombocythemia: Secondary | ICD-10-CM | POA: Diagnosis not present

## 2022-12-09 DIAGNOSIS — D75839 Thrombocytosis, unspecified: Secondary | ICD-10-CM | POA: Diagnosis not present

## 2022-12-09 LAB — CMP (CANCER CENTER ONLY)
ALT: 36 U/L (ref 0–44)
AST: 21 U/L (ref 15–41)
Albumin: 3.8 g/dL (ref 3.5–5.0)
Alkaline Phosphatase: 52 U/L (ref 38–126)
Anion gap: 4 — ABNORMAL LOW (ref 5–15)
BUN: 16 mg/dL (ref 6–20)
CO2: 31 mmol/L (ref 22–32)
Calcium: 9 mg/dL (ref 8.9–10.3)
Chloride: 105 mmol/L (ref 98–111)
Creatinine: 1 mg/dL (ref 0.44–1.00)
GFR, Estimated: >60 mL/min (ref >60)
Glucose, Bld: 89 mg/dL (ref 70–99)
Potassium: 3.9 mmol/L (ref 3.5–5.1)
Sodium: 140 mmol/L (ref 135–145)
Total Bilirubin: 0.3 mg/dL (ref 0.3–1.2)
Total Protein: 5.9 g/dL — ABNORMAL LOW (ref 6.5–8.1)

## 2022-12-09 LAB — CBC WITH DIFFERENTIAL (CANCER CENTER ONLY)
Abs Immature Granulocytes: 0.04 10*3/uL (ref 0.00–0.07)
Basophils Absolute: 0.1 10*3/uL (ref 0.0–0.1)
Basophils Relative: 1 %
Eosinophils Absolute: 0.2 10*3/uL (ref 0.0–0.5)
Eosinophils Relative: 2 %
HCT: 35.9 % — ABNORMAL LOW (ref 36.0–46.0)
Hemoglobin: 12.1 g/dL (ref 12.0–15.0)
Immature Granulocytes: 1 %
Lymphocytes Relative: 29 %
Lymphs Abs: 2.4 10*3/uL (ref 0.7–4.0)
MCH: 31.8 pg (ref 26.0–34.0)
MCHC: 33.7 g/dL (ref 30.0–36.0)
MCV: 94.5 fL (ref 80.0–100.0)
Monocytes Absolute: 0.8 10*3/uL (ref 0.1–1.0)
Monocytes Relative: 10 %
Neutro Abs: 4.8 10*3/uL (ref 1.7–7.7)
Neutrophils Relative %: 57 %
Platelet Count: 419 10*3/uL — ABNORMAL HIGH (ref 150–400)
RBC: 3.8 MIL/uL — ABNORMAL LOW (ref 3.87–5.11)
RDW: 15.9 % — ABNORMAL HIGH (ref 11.5–15.5)
WBC Count: 8.3 10*3/uL (ref 4.0–10.5)
nRBC: 0 % (ref 0.0–0.2)

## 2022-12-09 MED ORDER — HYDROXYUREA 500 MG PO CAPS
500.0000 mg | ORAL_CAPSULE | ORAL | 2 refills | Status: DC
  Filled 2022-12-09: qty 12, 28d supply, fill #0
  Filled 2023-01-06: qty 12, 28d supply, fill #1
  Filled 2023-02-01: qty 12, 28d supply, fill #2
  Filled 2023-02-26: qty 12, 28d supply, fill #3
  Filled 2023-03-22: qty 12, 28d supply, fill #4

## 2022-12-11 ENCOUNTER — Telehealth (HOSPITAL_COMMUNITY): Payer: BC Managed Care – PPO | Admitting: Student in an Organized Health Care Education/Training Program

## 2022-12-17 ENCOUNTER — Other Ambulatory Visit: Payer: Self-pay

## 2022-12-18 ENCOUNTER — Ambulatory Visit: Payer: BC Managed Care – PPO | Attending: Physician Assistant | Admitting: Physician Assistant

## 2022-12-18 ENCOUNTER — Other Ambulatory Visit (HOSPITAL_COMMUNITY)
Admission: RE | Admit: 2022-12-18 | Discharge: 2022-12-18 | Disposition: A | Discharge status: Home / Self Care | Payer: BC Managed Care – PPO | Source: Ambulatory Visit | Attending: Physician Assistant | Admitting: Physician Assistant

## 2022-12-18 ENCOUNTER — Encounter: Payer: Self-pay | Admitting: Physician Assistant

## 2022-12-18 VITALS — BP 116/76 | HR 75 | Temp 98.6°F | Ht 70.0 in | Wt 192.0 lb

## 2022-12-18 DIAGNOSIS — Z124 Encounter for screening for malignant neoplasm of cervix: Secondary | ICD-10-CM | POA: Insufficient documentation

## 2022-12-18 NOTE — Progress Notes (Signed)
Patient ID: Alicia Robinson, female   DOB: [birth year], 47 y.o.   MRN: [id number]   Alicia Robinson, is a 47 y.o. female  [id number]  [id number]  DOB - 01/10/1976  Chief Complaint  Patient presents with   Gynecologic Exam    Pap.        Subjective:   Alicia Robinson is a 47 y.o. female here today for a pap smear.  Still having regular periods.  Nos issues or concerns.  She has never had an abnormal pap.  No vaginal discharge.    No problems updated.  ALLERGIES: Allergies  Allergen Reactions   Bupropion Other (See Comments)    POLYURIAN   Nsaids     Renal insufficiency- no per pt    PAST MEDICAL HISTORY: Past Medical History:  Diagnosis Date   Anxiety    Depression    Drug reaction 10/22/2022   Hyperlipidemia    Hypertension    Stroke 2016   "mild"   Suicidal overdose     MEDICATIONS AT HOME: Prior to Admission medications   Medication Sig Start Date End Date Taking? Authorizing Provider  ascorbic acid (VITAMIN C) 500 MG tablet Take 500 mg by mouth daily.   Yes [provider]  atorvastatin (LIPITOR) 20 MG tablet Take 1 tablet (20 mg total) by mouth daily. 11/20/22  Yes Elsie Stain, MD  ferrous sulfate 324 MG TBEC Take 324 mg by mouth daily with breakfast.   Yes [provider]  hydrochlorothiazide (HYDRODIURIL) 25 MG tablet Take 0.5 tablets (12.5 mg total) by mouth daily. 11/20/22  Yes Elsie Stain, MD  hydroxyurea (HYDREA) 500 MG capsule Take 1 capsule (500 mg total) by mouth every Monday, Wednesday, and Friday.May take with food to minimize GI side effects. 12/09/22  Yes Alla Feeling, NP  omeprazole (PRILOSEC) 20 MG capsule Take 1 capsule (20 mg total) by mouth daily. 11/20/22  Yes Elsie Stain, MD  traZODone (DESYREL) 50 MG tablet Take 1-2 tablets (50-100 mg total) by mouth at bedtime as needed for sleep. 09/05/22 09/05/23 Yes [name], MD  Vitamin D, Ergocalciferol, (DRISDOL) 1.25 MG (50000 UNIT) CAPS capsule Take 1 capsule  (50,000 Units total) by mouth every 7 (seven) days. 11/20/22  Yes Elsie Stain, MD    ROS: Neg HEENT Neg resp Neg cardiac Neg GI Neg GU Neg MS Neg psych Neg neuro  Objective:   Vitals:   12/18/22 0852  BP: 116/76  Pulse: 75  Temp: 98.6 F (37 C)  TempSrc: Oral  SpO2: 99%  Weight: 192 lb (87.1 kg)  Height: 5\' 10"  (1.778 m)   Exam General appearance : Awake, alert, not in any distress. Speech Clear. Not toxic looking GU:  external genitalia WNL.  Speculum inserted and small amount of blood at os.  Pap taken.   Extremities: B/L Lower Ext shows no edema, both legs are warm to touch Neurology: Awake alert, and oriented X 3, CN II-XII intact, Non focal Skin: No Rash  Data Review Lab Results  Component Value Date   HGBA1C 5.4 10/22/2022   HGBA1C 5.7 (H) 03/10/2020   HGBA1C 5.4 11/19/2018    Assessment & Plan   1. Screening for cervical cancer - Cytology - PAP([facility])  Return for keep next appt with Dr Joya Gaskins.  The patient was given clear instructions to go to ER or return to medical center if symptoms don't improve, worsen or new problems develop. The patient verbalized understanding. The patient was told  to call to get lab results if they haven't heard anything in the next week.      Freeman Caldron, PA-C [facility] and [facility] [place], [place]   12/18/2022, 9:08 AM

## 2022-12-18 NOTE — Patient Instructions (Signed)
Pap Test Why am I having this test? A Pap test, also called a Pap smear, is a screening test to check for signs of: Infection. Cancer of the cervix. The cervix is the lower part of the uterus that opens into the vagina. Changes that may be a sign that cancer is developing (precancerous changes). Women need this test on a regular basis. In general, you should have a Pap test every 3 years until you reach menopause or age 49. Women aged 30-60 may choose to have their Pap test done at the same time as an HPV (human papillomavirus) test every 5 years (instead of every 3 years). Your health care provider may recommend having Pap tests more or less often depending on your medical conditions and past Pap test results. What is being tested? Cervical cells are tested for signs of infection or abnormalities. What kind of sample is taken?  Your health care provider will collect a sample of cells from the surface of your cervix. This will be done using a small cotton swab, plastic spatula, or brush that is inserted into your vagina using a tool called a speculum. This sample is often collected during a pelvic exam, when you are lying on your back on an exam table with your feet in footrests (stirrups). In some cases, fluids (secretions) from the cervix or vagina may also be collected. How do I prepare for this test? Be aware of where you are in your menstrual cycle. If you are menstruating on the day of the test, you may be asked to reschedule. You may need to reschedule if you have a known vaginal infection on the day of the test. Follow instructions from your health care provider about: Changing or stopping your regular medicines. Some medicines can cause abnormal test results, such as vaginal medicines and tetracycline. Avoiding douching 2-3 days before or the day of the test. Tell a health care provider about: Any allergies you have. All medicines you are taking, including vitamins, herbs, eye drops,  creams, and over-the-counter medicines. Any bleeding problems you have. Any surgeries you have had. Any medical conditions you have. Whether you are pregnant or may be pregnant. How are the results reported? Your test results will be reported as either abnormal or normal. What do the results mean? A normal test result means that you do not have signs of cancer of the cervix. An abnormal result may mean that you have: Cancer. A Pap test by itself is not enough to diagnose cancer. You will have more tests done if cancer is suspected. Precancerous changes in your cervix. Inflammation of the cervix. An STI (sexually transmitted infection). A fungal infection. A parasite infection. Talk with your health care provider about what your results mean. In some cases, your health care provider may do more testing to confirm the results. Questions to ask your health care provider Ask your health care provider, or the department that is doing the test: When will my results be ready? How will I get my results? What are my treatment options? What other tests do I need? What are my next steps? Summary In general, women should have a Pap test every 3 years until they reach menopause or age 49. Your health care provider will collect a sample of cells from the surface of your cervix. This will be done using a small cotton swab, plastic spatula, or brush. In some cases, fluids (secretions) from the cervix or vagina may also be collected. This information is not   intended to replace advice given to you by your health care provider. Make sure you discuss any questions you have with your health care provider. Document Revised: 12/01/2020 Document Reviewed: 12/01/2020 Elsevier Patient Education  2023 [employer]  

## 2022-12-20 LAB — CYTOLOGY - PAP
Comment: NEGATIVE
Diagnosis: NEGATIVE
High risk HPV: NEGATIVE

## 2022-12-26 ENCOUNTER — Telehealth: Payer: Self-pay | Admitting: Nurse Practitioner

## 2022-12-26 ENCOUNTER — Telehealth: Payer: Self-pay

## 2022-12-26 NOTE — Telephone Encounter (Signed)
Notified Patient of completion of FMLA forms. Fax transmission confirmation received. Copy of forms placed for pick-up as requested by Patient. No other needs or concerns voiced at this time. 

## 2022-12-27 ENCOUNTER — Telehealth (INDEPENDENT_AMBULATORY_CARE_PROVIDER_SITE_OTHER): Payer: Self-pay

## 2022-12-27 NOTE — Telephone Encounter (Signed)
Pt was called and is aware of results, DOB was confirmed.  ?

## 2022-12-27 NOTE — Telephone Encounter (Signed)
-----   Message from Guy Franco, RN sent at 12/27/2022 12:02 PM EDT -----  ----- Message ----- From: Anders Simmonds, PA-C Sent: 12/23/2022  10:46 AM EDT To: Guy Franco, RN  Your pap smear was normal.  Next pap smear will be due in 2027.  Thanks, Georgian Co, PA-C

## 2023-01-02 ENCOUNTER — Other Ambulatory Visit (HOSPITAL_COMMUNITY): Payer: Self-pay

## 2023-01-02 ENCOUNTER — Other Ambulatory Visit: Payer: Self-pay

## 2023-01-02 ENCOUNTER — Ambulatory Visit: Payer: BC Managed Care – PPO | Attending: Family Medicine | Admitting: Critical Care Medicine

## 2023-01-02 ENCOUNTER — Encounter: Payer: Self-pay | Admitting: Critical Care Medicine

## 2023-01-02 VITALS — BP 125/80 | HR 82 | Ht 70.0 in | Wt 191.2 lb

## 2023-01-02 DIAGNOSIS — F330 Major depressive disorder, recurrent, mild: Secondary | ICD-10-CM

## 2023-01-02 DIAGNOSIS — L608 Other nail disorders: Secondary | ICD-10-CM | POA: Insufficient documentation

## 2023-01-02 DIAGNOSIS — R519 Headache, unspecified: Secondary | ICD-10-CM | POA: Insufficient documentation

## 2023-01-02 DIAGNOSIS — D75839 Thrombocytosis, unspecified: Secondary | ICD-10-CM | POA: Diagnosis not present

## 2023-01-02 DIAGNOSIS — F411 Generalized anxiety disorder: Secondary | ICD-10-CM | POA: Diagnosis not present

## 2023-01-02 DIAGNOSIS — N1831 Chronic kidney disease, stage 3a: Secondary | ICD-10-CM

## 2023-01-02 DIAGNOSIS — J309 Allergic rhinitis, unspecified: Secondary | ICD-10-CM | POA: Insufficient documentation

## 2023-01-02 DIAGNOSIS — E785 Hyperlipidemia, unspecified: Secondary | ICD-10-CM

## 2023-01-02 DIAGNOSIS — E559 Vitamin D deficiency, unspecified: Secondary | ICD-10-CM

## 2023-01-02 DIAGNOSIS — G44229 Chronic tension-type headache, not intractable: Secondary | ICD-10-CM

## 2023-01-02 DIAGNOSIS — I10 Essential (primary) hypertension: Secondary | ICD-10-CM

## 2023-01-02 DIAGNOSIS — J301 Allergic rhinitis due to pollen: Secondary | ICD-10-CM

## 2023-01-02 MED ORDER — OLOPATADINE HCL 0.1 % OP SOLN
1.0000 [drp] | Freq: Two times a day (BID) | OPHTHALMIC | 12 refills | Status: DC
  Filled 2023-01-02: qty 5, 30d supply, fill #0

## 2023-01-02 MED ORDER — AZELASTINE HCL 0.1 % NA SOLN
2.0000 | Freq: Two times a day (BID) | NASAL | 12 refills | Status: DC
  Filled 2023-01-02 (×2): qty 30, 30d supply, fill #0

## 2023-01-02 NOTE — Assessment & Plan Note (Signed)
Associated allergic conjunctivitis Plan Astelin 2 sprays each nostril daily and saline rinse

## 2023-01-02 NOTE — Progress Notes (Signed)
Established Patient Office Visit  Subjective   Patient ID: Alicia Robinson, female    DOB: May 07, 1976  Age: 47 y.o. MRN: [id number]  Chief Complaint  Patient presents with   Hypertension   URI     10/2022 This is a primary care of Dr. Alvis Lemmings who comes in today with polydipsia and polyuria.  The patient had been seen on the mobile medicine unit several weeks ago.  Labs were not remarkable.  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  Patient notes increased thirst and she will drink a gallon of water a day.  This been going on for 6 months.  She denies nausea or vomiting.  She does have renal insufficiency as well with a GFR of 57.  Her vaginal bleeding complaint that she went to the mobile unit with is actually resolved itself.  Interestingly her mental health doctor has her on a very large dose of bupropion at 450 mg daily.  With her creatinine a typical dose is 150 mg daily.  She has not been screened for diabetes in 2 years.  She does tend to run a slightly high blood sugar.  11/20/22 Requesting change to PCP The patient was seen last month and had polyuria thought to be due to bupropion bupropion was stopped polyuria has resolved.  Patient comes in today requesting to change providers to my clinic.  She has seen hematology and has been diagnosed with essential thrombocytopenia/M PN  Below is documentation of the last hematology visit. Saw [facility] for thrombocythemia 10/2022 1. 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 WBC range 11.1 - 16.2, with predominant neutrophils, and platelet range 429 - 611  -10/30/22 iron panel was normal. She is on oral iron for perimenopausal bleeding which is heavy at times -Further work up showed JAK2 Val617Phe, which confirms  essential thrombocythemia/MPN -ABD US showed small lesion on the spleen which is favored benign hemangioma. She has no other abd imaging in our system but reportedly had US done in [place] a few months ago, the report is in [place] which she can't translate at this time. We plan to monitor with repeat ABD [place] in 6 months  -I reviewed the work up and diagnosis with pt in detail, we discussed the increased risk of thrombosis with ET and other risk factors such as transformation to leukemia.  -I reviewed the case with Dr. Mosetta Putt. The recommendation is to start hydrea at low dose (last plt 449 on 2/14, no other cytopenias or CBC abnormalities) 500 mg po daily on MWF -I reviewed the potential side effects, especially fatigue, GI side effects, and cytopenias; and symptom management with pt. She agrees to proceed -Will repeat labs in 2 and 4 weeks from start, then f/up in 4 weeks   2. Atypical chest pain, h/o CVA -She had a stroke in 2016 at age 89, details are unclear whether hemorrhagic vs ischemic. She was on aspirin for 6 months then stopped. No residual deficits -She reports intermittent left chest pain, non-exertional -She went to ED in 06/2022 for this pain, work up was unremarkable. -10/30/22 exam is benign -F/up PCP  -We discussed ET may have been the cause of her stroke, given the high risk of thrombosis with ET   3. Abnormal menses -She had normal periods until 08/2022  when her period lasted 1 month -She has fatigue and lightheadedness, possibly from blood loss or perimenopause  -She started herself on oral iron in 09/2022, tolerating well -Menstrual period in Feb was much lighter and lasted 3 days   4. Age-appropriate health maintenance -I encouraged her to continue age appropriate health maintenance and cancer screenings, avoid smoking/alcohol, and exercise.  -She is overdue for first screening colonoscopy. Per PCP   PLAN: -Reviewed recent labs including MPN panel and abd  [place] -JAK2 +, she has MPN/ET -Begin Hydrea 500 mg daily on Mon/Wed/Fri -Lab in 2 and 4 weeks after starting Hydrea -F/up 4 weeks after starting  -Repeat ABD [place] in 6 months  -Case reviewed with Dr. Mosetta Putt   The patient is now on hydroxyurea 3 times weekly.  Follow-up labs are ordered.  Patient also needs a Pap smear to be scheduled.  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  The patient is followed by behavioral health and they were notified of the discontinuance of bupropion.  She does have follow-up with them.  Mental health is stable at this time.  01/03/23 This patient is seen for primary care follow-up from a March visit.  She does have thrombocytosis and is under care by hematology on hydroxyurea  3 times weekly.  Today she brings a concern that she still having neck and frontal headaches, dry mouth, snoring, URI type symptoms, body aches, eye irritation and sneezing.  These have been going on for the past month.  She also has a blackened area at the tips of her nails.  This can be associated with hydroxyurea.  The headaches have been going on some time as have been the dry mouth snoring.  She drinks 1 glass of wine a day.  She denies chronic fatigue during the day. Blood pressure on arrival is excellent and she is on the hydrochlorothiazide 12 and half milligram daily  The patient is yet to follow-up with mental health after we stopped the Wellbutrin earlier this year due to side effects  Below is the assessment from last hematology visit  1. 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 WBC range 11.1 - 16.2, with predominant neutrophils, and platelet range 429 - 611  -10/30/22 iron panel was normal. She is on oral iron for perimenopausal bleeding which is heavy  at times -Further work up showed JAK2 Val617Phe, which confirms essential thrombocythemia/MPN -ABD US showed small lesion on the spleen which is favored benign hemangioma. She has no other abd imaging in our system but reportedly had US done in [place] a few months ago, the report is in [place] which she can't translate at this time. We plan to monitor with repeat ABD [place] in 6 months  -She began low dose hydrea 500 mg po daily on MWF, starting 11/13/22. Tolerating well except weight gain -Ms. Bastarache appears stable. Plt improved from 449 to 419 on hydrea so far, hgb also decreasing but no anemia yet. Will continue same dose -No signs of thrombosis. She would like to go to [place] in July. We reviewed risk reducing behaviors. -Lab monthly, f/up in 3 months    2. Atypical chest pain, h/o CVA -She had a stroke in 2016 at age 61, details are unclear whether hemorrhagic vs ischemic. She was  on aspirin for 6 months then stopped. No residual deficits -She reports intermittent left chest pain, non-exertional -She went to ED in 06/2022 for this pain, work up was unremarkable.  -10/30/22 exam is benign -F/up PCP  -We discussed ET may have been the cause of her stroke, given the high risk of thrombosis with ET -She continues to have intermittent chest pain, seen in ED 11/24/22, work up was unremarkable. I offered cardiology referral and she agreed.   3. Abnormal menses -She had normal periods until 08/2022 when her period lasted 1 month -She has fatigue and lightheadedness, possibly from blood loss or perimenopause  -She started herself on oral iron in 09/2022, tolerating well -Menstrual period in Feb was much lighter and lasted 3 days   4. Age-appropriate health maintenance -I encouraged her to continue age appropriate health maintenance and cancer screenings, avoid smoking/alcohol, and exercise.  -She is overdue for first screening colonoscopy. Per PCP   PLAN: -Labs reviewed -Continue low dose  hydrea 500 mg on MWF (3x per week) -Discussed testing for family members and thrombosis precautions -Lab monthly and f/up in 3 months          Review of Systems  Constitutional:  Negative for chills, diaphoresis, fever, malaise/fatigue and weight loss.  HENT:  Positive for congestion. Negative for ear discharge, ear pain, hearing loss, nosebleeds, sore throat and tinnitus.        Dry mouth  Eyes:  Negative for blurred vision, double vision, photophobia and discharge.  Respiratory:  Negative for cough, hemoptysis, sputum production, shortness of breath, wheezing and stridor.        No excess mucus  Cardiovascular:  Negative for chest pain, palpitations, orthopnea, claudication, leg swelling and PND.  Gastrointestinal:  Negative for abdominal pain, blood in stool, constipation, diarrhea, heartburn, melena, nausea and vomiting.  Genitourinary:  Negative for dysuria, flank pain, frequency, hematuria and urgency.       Polyuria flank pain on the right  Musculoskeletal:  Positive for myalgias. Negative for back pain, falls, joint pain and neck pain.  Skin:  Negative for itching and rash.  Neurological:  Positive for headaches. Negative for dizziness, tingling, tremors, sensory change, speech change, focal weakness, seizures, loss of consciousness and weakness.  Endo/Heme/Allergies:  Negative for environmental allergies and polydipsia. Does not bruise/bleed easily.  Psychiatric/Behavioral:  Negative for depression, hallucinations, memory loss, substance abuse and suicidal ideas. The patient is not nervous/anxious and does not have insomnia.   All other systems reviewed and are negative.     Objective:     BP 123/85   Pulse 82   Ht  (1.778 m)   Wt 191 lb 3.2 oz (86.7 kg)   LMP 12/21/2022 (Exact Date)   SpO2 100%   BMI 27.43 kg/m    Physical Exam Vitals reviewed.  Constitutional:      Appearance: Normal appearance. She is well-developed. She is not diaphoretic.  HENT:      Head: Normocephalic and atraumatic.     Right Ear: Tympanic membrane, ear canal and external ear normal. There is no impacted cerumen.     Left Ear: Tympanic membrane, ear canal and external ear normal. There is no impacted cerumen.     Nose: Congestion and rhinorrhea present. No nasal deformity, septal deviation or mucosal edema.     Right Sinus: No maxillary sinus tenderness or frontal sinus tenderness.     Left Sinus: No maxillary sinus tenderness or frontal sinus tenderness.     Mouth/Throat:  Mouth: Mucous membranes are dry.     Pharynx: Oropharynx is clear. No oropharyngeal exudate or posterior oropharyngeal erythema.  Eyes:     General: No scleral icterus.    Conjunctiva/sclera: Conjunctivae normal.     Pupils: Pupils are equal, round, and reactive to light.  Neck:     Thyroid: No thyromegaly.     Vascular: No carotid bruit or JVD.     Trachea: Trachea normal. No tracheal tenderness or tracheal deviation.  Cardiovascular:     Rate and Rhythm: Normal rate and regular rhythm.     Chest Wall: PMI is not displaced.     Pulses: Normal pulses. No decreased pulses.     Heart sounds: Normal heart sounds, S1 normal and S2 normal. Heart sounds not distant. No murmur heard.    No systolic murmur is present.     No diastolic murmur is present.     No friction rub. No gallop. No S3 or S4 sounds.  Pulmonary:     Effort: Pulmonary effort is normal. No tachypnea, accessory muscle usage or respiratory distress.     Breath sounds: Normal breath sounds. No stridor. No decreased breath sounds, wheezing, rhonchi or rales.  Chest:     Chest wall: No tenderness.  Abdominal:     General: Bowel sounds are normal. There is no distension.     Palpations: Abdomen is soft. Abdomen is not rigid.     Tenderness: There is no abdominal tenderness. There is no right CVA tenderness, guarding or rebound.  Musculoskeletal:        General: Normal range of motion.     Cervical back: Normal range of motion and  neck supple. No edema, erythema or rigidity. No muscular tenderness. Normal range of motion.  Lymphadenopathy:     Head:     Right side of head: No submental or submandibular adenopathy.     Left side of head: No submental or submandibular adenopathy.     Cervical: No cervical adenopathy.  Skin:    General: Skin is warm and dry.     Coloration: Skin is not pale.     Findings: No rash.     Nails: There is no clubbing.     Comments: Dry skin, blackened area tips of all nails  Neurological:     Mental Status: She is alert and oriented to person, place, and time.     Sensory: No sensory deficit.  Psychiatric:        Mood and Affect: Mood normal.        Speech: Speech normal.        Behavior: Behavior normal.        Thought Content: Thought content normal.        Judgment: Judgment normal.      No results found for any visits on 01/02/23.    The ASCVD Risk score (Arnett DK, et al., 2019) failed to calculate for the following reasons:   The patient has a prior MI or stroke diagnosis    Assessment & Plan:   Problem List Items Addressed This Visit       Cardiovascular and Mediastinum   HTN (hypertension) - Primary    Hypertension at acceptable levels continue hydrochlorothiazide as prescribed        Respiratory   Allergic rhinitis    Associated allergic conjunctivitis Plan Astelin 2 sprays each nostril daily and saline rinse        Genitourinary   RESOLVED: Stage 3a chronic kidney disease  Renal function improved at last visit        Hematopoietic and Hemostatic   Thrombocytosis    Per hematology I am concerned hydroxyurea is causing nailbed discoloration and she will start hair and nail vitamin        Other   Hyperlipidemia    Monitor last visit was normal      MDD (major depressive disorder)    Asked patient to follow-up with mental health      Generalized anxiety disorder    Asked patient to follow-up with a mental health      Vitamin D  insufficiency    Continue vitamin D supplement      Chronic headaches    Likely tension in relation take Tylenol Extra Strength 4 times daily as needed      Discolored nails    Suspect from hydroxyurea asked to take hair and nail supplement     Return in about 4 months (around 05/04/2023) for primary care follow up, htn.    Shan Levans, MD

## 2023-01-02 NOTE — Assessment & Plan Note (Signed)
Renal function improved at last visit

## 2023-01-02 NOTE — Assessment & Plan Note (Signed)
Suspect from hydroxyurea asked to take hair and nail supplement

## 2023-01-02 NOTE — Assessment & Plan Note (Signed)
Likely tension in relation take Tylenol Extra Strength 4 times daily as needed

## 2023-01-02 NOTE — Assessment & Plan Note (Signed)
Continue vitamin D supplement

## 2023-01-02 NOTE — Assessment & Plan Note (Signed)
Per hematology I am concerned hydroxyurea is causing nailbed discoloration and she will start hair and nail vitamin

## 2023-01-02 NOTE — Assessment & Plan Note (Signed)
Asked patient to follow-up with a mental health

## 2023-01-02 NOTE — Assessment & Plan Note (Signed)
Asked patient to follow-up with mental health

## 2023-01-02 NOTE — Patient Instructions (Addendum)
You do not have infection you have severe allergic changes in your nose and eyes likely from the severe pollen were currently experiencing  Start Astelin 2 sprays each nostril twice daily  I would also use a saline salt water nasal rinse do that for the Astelin 2 to 3 sprays twice daily in your nostrils  Start Aveeno cream for dry skin  Use Biotene mouthwash at night rinse and spit out  Start a hair and nail vitamin to get this over-the-counter 1 daily this is likely secondary to the hydroxyurea you are on  Use extra strength Tylenol to 4 times daily as needed for headache  Contact your mental health provider get a follow-up appointment  If headache continues we can send you to neurology this may be migraine in nature  Blood pressure excellent stay on the current dose of hydrochlorothiazide  Return to Dr. Delford Field 4 months

## 2023-01-02 NOTE — Assessment & Plan Note (Signed)
Monitor last visit was normal

## 2023-01-02 NOTE — Assessment & Plan Note (Signed)
Hypertension at acceptable levels continue hydrochlorothiazide as prescribed

## 2023-01-02 NOTE — Progress Notes (Signed)
Patient states that nails are turning black, along side with head and body aches. Started a couple of weeks ago.  Patient states that she also has a cold and is experiencing fatigue. Started yesterday.

## 2023-01-05 NOTE — Progress Notes (Unsigned)
Patient Care Team: Alicia Frisk, MD as PCP - General (Pulmonary Disease)   CHIEF COMPLAINT: Follow up essential thrombocythemia/ET  CURRENT THERAPY: Hydrea 500 mg 3x/week MWF, starting 11/14/2022  INTERVAL HISTORY Alicia Robinson returns for symptom management. Last seen by me 12/09/22. She continues Hydrea MWF, plt has been improving. In the past month she has noticed the following new/worsening symptoms and she would like to discuss: Congestion, watery eyes, tearing, drenching night sweats and fell off the bed twice, shorter periods, blurry vision, headaches, abdominal cramping pain that wraps around her back, teeth/gums hurting, mood swings and irritability, burning in her hands and feet, lower appetite, and some bruising on her forearms.  She notes that some of these were present prior to diagnosis of ET and starting Hydrea, but some are new and others have worsened.  She was given allergy medication which has helped congestion and watery eyes.  No fever.  She remains active and goes to the gym most days a week, denies bleeding, leg swelling, new cough, dyspnea.  Denies change in her bowel habits.  She has done a lot of research online, has questions about ET and her medication, and would like to know more about her condition.  ROS  All other systems reviewed and negative  Past Medical History:  Diagnosis Date   Anxiety    Depression    Drug reaction 10/22/2022   Hyperlipidemia    Hypertension    Stroke 2016   "mild"   Suicidal overdose      Past Surgical History:  Procedure Laterality Date   CHOLECYSTECTOMY     SIGMOIDOSCOPY     pt is unsure about this   TUBAL LIGATION     UPPER GASTROINTESTINAL ENDOSCOPY       Outpatient Encounter Medications as of 01/06/2023  Medication Sig   ascorbic acid (VITAMIN C) 500 MG tablet Take 500 mg by mouth daily.   atorvastatin (LIPITOR) 20 MG tablet Take 1 tablet (20 mg total) by mouth daily.   azelastine (ASTELIN) 0.1 % nasal spray Place 2  sprays into both nostrils 2 (two) times daily. Use in each nostril as directed   ferrous sulfate 324 MG TBEC Take 324 mg by mouth daily with breakfast.   hydrochlorothiazide (HYDRODIURIL) 25 MG tablet Take 0.5 tablets (12.5 mg total) by mouth daily.   hydroxyurea (HYDREA) 500 MG capsule Take 1 capsule (500 mg total) by mouth every Monday, Wednesday, and Friday.May take with food to minimize GI side effects.   Multiple Vitamins-Minerals (HAIR SKIN AND NAILS FORMULA) TABS Take 1 tablet by mouth daily.   olopatadine (PATANOL) 0.1 % ophthalmic solution Place 1 drop into both eyes 2 (two) times daily.   omeprazole (PRILOSEC) 20 MG capsule Take 1 capsule (20 mg total) by mouth daily.   traZODone (DESYREL) 50 MG tablet Take 1-2 tablets (50-100 mg total) by mouth at bedtime as needed for sleep.   Turmeric (QC TUMERIC COMPLEX) 500 MG CAPS Take 2 tablets by mouth daily.   Vitamin D, Ergocalciferol, (DRISDOL) 1.25 MG (50000 UNIT) CAPS capsule Take 1 capsule (50,000 Units total) by mouth every 7 (seven) days.   No facility-administered encounter medications on file as of 01/06/2023.     Today's Vitals   01/06/23 1002  BP: 122/86  Pulse: 68  Resp: 17  Temp: 98.1 F (36.7 C)  TempSrc: Oral  SpO2: 100%  Weight: 197 lb 6.4 oz (89.5 kg)   Body mass index is 28.32 kg/m.   PHYSICAL  EXAM GENERAL:alert, no distress and comfortable SKIN: no rash.  2 small bruises on forearms EYES: sclera clear LUNGS:  normal breathing effort HEART:  no lower extremity edema NEURO: alert & oriented x 3 with fluent speech, no focal motor/sensory deficits    CBC    Component Value Date/Time   WBC 8.7 01/06/2023 0945   WBC 11.0 (H) 11/24/2022 1627   RBC 4.42 01/06/2023 0945   HGB 13.9 01/06/2023 0945   HGB 14.2 09/30/2022 1734   HCT 41.9 01/06/2023 0945   HCT 41.2 09/30/2022 1734   PLT 501 (H) 01/06/2023 0945   PLT 525 (H) 09/30/2022 1734   MCV 94.8 01/06/2023 0945   MCV 87 09/30/2022 1734   MCH 31.4  01/06/2023 0945   MCHC 33.2 01/06/2023 0945   RDW 14.4 01/06/2023 0945   RDW 15.7 (H) 09/30/2022 1734   LYMPHSABS 2.6 01/06/2023 0945   LYMPHSABS 3.1 09/30/2022 1734   MONOABS 0.7 01/06/2023 0945   EOSABS 0.3 01/06/2023 0945   EOSABS 0.3 09/30/2022 1734   BASOSABS 0.0 01/06/2023 0945   BASOSABS 0.1 09/30/2022 1734     CMP     Component Value Date/Time   NA 138 01/06/2023 0945   NA 139 10/22/2022 0954   K 4.3 01/06/2023 0945   CL 103 01/06/2023 0945   CO2 31 01/06/2023 0945   GLUCOSE 87 01/06/2023 0945   BUN 17 01/06/2023 0945   BUN 7 10/22/2022 0954   CREATININE 1.14 (H) 01/06/2023 0945   CALCIUM 9.6 01/06/2023 0945   PROT 7.1 01/06/2023 0945   PROT 6.8 04/17/2022 0953   ALBUMIN 4.3 01/06/2023 0945   ALBUMIN 4.1 10/22/2022 0954   AST 26 01/06/2023 0945   ALT 37 01/06/2023 0945   ALKPHOS 55 01/06/2023 0945   BILITOT 0.3 01/06/2023 0945   GFRNONAA 59 (L) 01/06/2023 0945   GFRAA >60 06/07/2020 1541     ASSESSMENT & PLAN:47 year old female   MPN/Essential Thrombocythemia (ET), 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 WBC range 11.1 - 16.2, with predominant neutrophils, and platelet range 429 - 611  -10/30/22 iron panel was normal. 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 She has no other abd imaging in our system but reportedly had US done in [place] a few months ago, the report is in [place] which she can't translate at this time. We plan to monitor with repeat ABD [place] in 6 months  -She began low dose hydrea 500 mg po daily on MWF, starting 11/13/22. Tolerating well except weight gain, plt count improved in the first month -Mr. Kosch appears stable. She has  many symptoms/SE's that may not all be related or from ET/Hydrea. She feels they are mild and tolerable.  -Labs show plt 501 (since starting hydrea 449, 545, 474, 419), may have some degree of inflammation from allergy flare. She would like to continue current dose for now and monitor closely.  -Continue Hydrea 500 mg MWF, lab in 2 and 4 weeks -If plt higher next time, will increase to 5x per week (if tolerating well) -I reviewed MPN panel and answered her many thoughtful questions about ET to her satisfaction   2. SE's:  Congestion, watery eyes, tearing, drenching night sweats and  fell off the bed twice, shorter periods, blurry vision, headaches, abdominal cramping pain that wraps around her back, teeth/gums hurting, mood swings and irritability, burning in her hands and feet, lower appetite, and some bruising on her forearms -We discussed the above in detail, likely not all related to each other or ET/hydrea -Congestion, watery eyes/blurry vision, tearing, headaches, teeth pain are likely allergy/sinus issues, continue symptom management -night sweats, shorter periods, cramping, mood lability are likely from perimenopause -burning hands/feet - recent A1c is 5.4, no known B12 deficiency. Monitor -fatigue, low appetite, mild bruising are likely from hydrea. These are mild and tolerable. Will continue current dose and monitor for now -We discussed symptom management -OK to take skin/hair/nail vitamin, turmeric, and curcumin; no DDIs with Hydrea   3. Atypical chest pain, h/o CVA -She had a stroke in 2016 at age 21, details are unclear whether hemorrhagic vs ischemic. She was on aspirin for 6 months then stopped. No residual deficits -She reports intermittent left chest pain, non-exertional -She went to ED in 06/2022 for this pain, work up was unremarkable.  -10/30/22 exam is benign -F/up PCP  -We discussed ET may have been the cause of her stroke, given the high risk of thrombosis with ET -She  continues to have intermittent chest pain, seen in ED 11/24/22, work up was unremarkable. I offered cardiology referral and she agreed.   4. Abnormal menses -She had normal periods until 08/2022 when her period lasted 1 month -She has fatigue and lightheadedness, possibly from blood loss or perimenopause  -She started herself on oral iron in 09/2022, tolerating well -Menstrual period the past few months have been shorter and lighter, with nigh sweats. Likely perimenopause   5. Age-appropriate health maintenance -I encouraged her to continue age appropriate health maintenance and cancer screenings, avoid smoking/alcohol, and exercise.  -She is overdue for first screening colonoscopy. Per PCP    PLAN: -Labs reviewed, plt 501 -Continue Hydrea 500 mg 3x per week MWF -Lab in 2 and 4 weeks, f/up 6/24 as scheduled, or sooner if needed -Symptom management for SEs above -Answered questions    All questions were answered. The patient knows to call the clinic with any problems, questions or concerns. No barriers to learning were detected. I spent 25 minutes counseling the patient face to face. The total time spent in the appointment was 40 minutes and more than 50% was on counseling, review of test results, and coordination of care.   Alicia Glad, NP-C 01/06/2023

## 2023-01-06 ENCOUNTER — Other Ambulatory Visit: Payer: Self-pay

## 2023-01-06 ENCOUNTER — Inpatient Hospital Stay: Payer: BC Managed Care – PPO

## 2023-01-06 ENCOUNTER — Encounter: Payer: Self-pay | Admitting: Nurse Practitioner

## 2023-01-06 ENCOUNTER — Inpatient Hospital Stay: Payer: BC Managed Care – PPO | Attending: Nurse Practitioner | Admitting: Nurse Practitioner

## 2023-01-06 VITALS — BP 122/86 | HR 68 | Temp 98.1°F | Resp 17 | Wt 197.4 lb

## 2023-01-06 DIAGNOSIS — R0789 Other chest pain: Secondary | ICD-10-CM | POA: Diagnosis not present

## 2023-01-06 DIAGNOSIS — Z79899 Other long term (current) drug therapy: Secondary | ICD-10-CM | POA: Insufficient documentation

## 2023-01-06 DIAGNOSIS — Z8673 Personal history of transient ischemic attack (TIA), and cerebral infarction without residual deficits: Secondary | ICD-10-CM | POA: Diagnosis not present

## 2023-01-06 DIAGNOSIS — D473 Essential (hemorrhagic) thrombocythemia: Secondary | ICD-10-CM | POA: Diagnosis not present

## 2023-01-06 LAB — CBC WITH DIFFERENTIAL (CANCER CENTER ONLY)
Abs Immature Granulocytes: 0.03 10*3/uL (ref 0.00–0.07)
Basophils Absolute: 0 10*3/uL (ref 0.0–0.1)
Basophils Relative: 1 %
Eosinophils Absolute: 0.3 10*3/uL (ref 0.0–0.5)
Eosinophils Relative: 3 %
HCT: 41.9 % (ref 36.0–46.0)
Hemoglobin: 13.9 g/dL (ref 12.0–15.0)
Immature Granulocytes: 0 %
Lymphocytes Relative: 30 %
Lymphs Abs: 2.6 10*3/uL (ref 0.7–4.0)
MCH: 31.4 pg (ref 26.0–34.0)
MCHC: 33.2 g/dL (ref 30.0–36.0)
MCV: 94.8 fL (ref 80.0–100.0)
Monocytes Absolute: 0.7 10*3/uL (ref 0.1–1.0)
Monocytes Relative: 9 %
Neutro Abs: 5 10*3/uL (ref 1.7–7.7)
Neutrophils Relative %: 57 %
Platelet Count: 501 10*3/uL — ABNORMAL HIGH (ref 150–400)
RBC: 4.42 MIL/uL (ref 3.87–5.11)
RDW: 14.4 % (ref 11.5–15.5)
WBC Count: 8.7 10*3/uL (ref 4.0–10.5)
nRBC: 0 % (ref 0.0–0.2)

## 2023-01-06 LAB — CMP (CANCER CENTER ONLY)
ALT: 37 U/L (ref 0–44)
AST: 26 U/L (ref 15–41)
Albumin: 4.3 g/dL (ref 3.5–5.0)
Alkaline Phosphatase: 55 U/L (ref 38–126)
Anion gap: 4 — ABNORMAL LOW (ref 5–15)
BUN: 17 mg/dL (ref 6–20)
CO2: 31 mmol/L (ref 22–32)
Calcium: 9.6 mg/dL (ref 8.9–10.3)
Chloride: 103 mmol/L (ref 98–111)
Creatinine: 1.14 mg/dL — ABNORMAL HIGH (ref 0.44–1.00)
GFR, Estimated: 59 mL/min — ABNORMAL LOW (ref >60)
Glucose, Bld: 87 mg/dL (ref 70–99)
Potassium: 4.3 mmol/L (ref 3.5–5.1)
Sodium: 138 mmol/L (ref 135–145)
Total Bilirubin: 0.3 mg/dL (ref 0.3–1.2)
Total Protein: 7.1 g/dL (ref 6.5–8.1)

## 2023-01-07 ENCOUNTER — Other Ambulatory Visit: Payer: Self-pay

## 2023-01-13 ENCOUNTER — Other Ambulatory Visit: Payer: Self-pay

## 2023-01-20 ENCOUNTER — Ambulatory Visit: Payer: Self-pay | Admitting: Family Medicine

## 2023-01-20 ENCOUNTER — Inpatient Hospital Stay: Payer: BC Managed Care – PPO | Attending: Nurse Practitioner

## 2023-01-20 ENCOUNTER — Other Ambulatory Visit: Payer: Self-pay

## 2023-01-20 DIAGNOSIS — D473 Essential (hemorrhagic) thrombocythemia: Secondary | ICD-10-CM | POA: Diagnosis present

## 2023-01-20 LAB — CBC WITH DIFFERENTIAL (CANCER CENTER ONLY)
Abs Immature Granulocytes: 0.03 10*3/uL (ref 0.00–0.07)
Basophils Absolute: 0.1 10*3/uL (ref 0.0–0.1)
Basophils Relative: 1 %
Eosinophils Absolute: 0.2 10*3/uL (ref 0.0–0.5)
Eosinophils Relative: 3 %
HCT: 39.7 % (ref 36.0–46.0)
Hemoglobin: 13.9 g/dL (ref 12.0–15.0)
Immature Granulocytes: 0 %
Lymphocytes Relative: 26 %
Lymphs Abs: 2.2 10*3/uL (ref 0.7–4.0)
MCH: 32.9 pg (ref 26.0–34.0)
MCHC: 35 g/dL (ref 30.0–36.0)
MCV: 94.1 fL (ref 80.0–100.0)
Monocytes Absolute: 0.7 10*3/uL (ref 0.1–1.0)
Monocytes Relative: 9 %
Neutro Abs: 5.1 10*3/uL (ref 1.7–7.7)
Neutrophils Relative %: 61 %
Platelet Count: 454 10*3/uL — ABNORMAL HIGH (ref 150–400)
RBC: 4.22 MIL/uL (ref 3.87–5.11)
RDW: 14 % (ref 11.5–15.5)
WBC Count: 8.2 10*3/uL (ref 4.0–10.5)
nRBC: 0 % (ref 0.0–0.2)

## 2023-01-20 LAB — CMP (CANCER CENTER ONLY)
ALT: 23 U/L (ref 0–44)
AST: 20 U/L (ref 15–41)
Albumin: 4.3 g/dL (ref 3.5–5.0)
Alkaline Phosphatase: 50 U/L (ref 38–126)
Anion gap: 4 — ABNORMAL LOW (ref 5–15)
BUN: 16 mg/dL (ref 6–20)
CO2: 30 mmol/L (ref 22–32)
Calcium: 9.3 mg/dL (ref 8.9–10.3)
Chloride: 103 mmol/L (ref 98–111)
Creatinine: 1.02 mg/dL — ABNORMAL HIGH (ref 0.44–1.00)
GFR, Estimated: >60 mL/min (ref >60)
Glucose, Bld: 72 mg/dL (ref 70–99)
Potassium: 4.2 mmol/L (ref 3.5–5.1)
Sodium: 137 mmol/L (ref 135–145)
Total Bilirubin: 0.4 mg/dL (ref 0.3–1.2)
Total Protein: 6.9 g/dL (ref 6.5–8.1)

## 2023-01-22 ENCOUNTER — Other Ambulatory Visit: Payer: Self-pay

## 2023-01-22 ENCOUNTER — Telehealth: Payer: Self-pay

## 2023-01-22 NOTE — Telephone Encounter (Addendum)
Called patient and relayed message below as per Santiago Glad NP. Checked to see if patient needed refills but she had 2 refills remaining. Patient voiced full understanding.    ----- Message from Pollyann Samples, NP sent at 01/22/2023  9:29 AM EDT ----- Jonny Ruiz, please let her know plt count 454. I would like to get her <400. I recommend to take hydrea 1 tab daily M-F (5 days per week). Please refill if she needs.   Thanks, Clayborn Heron NP

## 2023-02-03 ENCOUNTER — Other Ambulatory Visit: Payer: Self-pay

## 2023-02-03 ENCOUNTER — Other Ambulatory Visit (HOSPITAL_COMMUNITY): Payer: Self-pay

## 2023-02-03 ENCOUNTER — Inpatient Hospital Stay: Payer: BC Managed Care – PPO

## 2023-02-03 DIAGNOSIS — D473 Essential (hemorrhagic) thrombocythemia: Secondary | ICD-10-CM

## 2023-02-03 LAB — CBC WITH DIFFERENTIAL (CANCER CENTER ONLY)
Abs Immature Granulocytes: 0.03 10*3/uL (ref 0.00–0.07)
Basophils Absolute: 0 10*3/uL (ref 0.0–0.1)
Basophils Relative: 1 %
Eosinophils Absolute: 0.2 10*3/uL (ref 0.0–0.5)
Eosinophils Relative: 2 %
HCT: 38.9 % (ref 36.0–46.0)
Hemoglobin: 13.6 g/dL (ref 12.0–15.0)
Immature Granulocytes: 0 %
Lymphocytes Relative: 24 %
Lymphs Abs: 1.9 10*3/uL (ref 0.7–4.0)
MCH: 32.5 pg (ref 26.0–34.0)
MCHC: 35 g/dL (ref 30.0–36.0)
MCV: 92.8 fL (ref 80.0–100.0)
Monocytes Absolute: 0.7 10*3/uL (ref 0.1–1.0)
Monocytes Relative: 8 %
Neutro Abs: 5.3 10*3/uL (ref 1.7–7.7)
Neutrophils Relative %: 65 %
Platelet Count: 428 10*3/uL — ABNORMAL HIGH (ref 150–400)
RBC: 4.19 MIL/uL (ref 3.87–5.11)
RDW: 14.1 % (ref 11.5–15.5)
WBC Count: 8.1 10*3/uL (ref 4.0–10.5)
nRBC: 0 % (ref 0.0–0.2)

## 2023-02-03 LAB — CMP (CANCER CENTER ONLY)
ALT: 49 U/L — ABNORMAL HIGH (ref 0–44)
AST: 44 U/L — ABNORMAL HIGH (ref 15–41)
Albumin: 4.1 g/dL (ref 3.5–5.0)
Alkaline Phosphatase: 49 U/L (ref 38–126)
Anion gap: 5 (ref 5–15)
BUN: 13 mg/dL (ref 6–20)
CO2: 29 mmol/L (ref 22–32)
Calcium: 9.1 mg/dL (ref 8.9–10.3)
Chloride: 106 mmol/L (ref 98–111)
Creatinine: 0.98 mg/dL (ref 0.44–1.00)
GFR, Estimated: >60 mL/min (ref >60)
Glucose, Bld: 77 mg/dL (ref 70–99)
Potassium: 4.1 mmol/L (ref 3.5–5.1)
Sodium: 140 mmol/L (ref 135–145)
Total Bilirubin: 0.4 mg/dL (ref 0.3–1.2)
Total Protein: 6.8 g/dL (ref 6.5–8.1)

## 2023-02-27 ENCOUNTER — Other Ambulatory Visit: Payer: Self-pay

## 2023-02-27 ENCOUNTER — Telehealth: Payer: Self-pay | Admitting: Nurse Practitioner

## 2023-02-27 NOTE — Telephone Encounter (Signed)
Called patient twice, had left a message regarding her new appointment times/dates. Also left a callback number in case needing to reschedule.

## 2023-03-04 ENCOUNTER — Other Ambulatory Visit (HOSPITAL_COMMUNITY): Payer: Self-pay

## 2023-03-05 ENCOUNTER — Encounter: Payer: Self-pay | Admitting: Critical Care Medicine

## 2023-03-05 ENCOUNTER — Ambulatory Visit: Payer: BC Managed Care – PPO | Attending: Critical Care Medicine | Admitting: Critical Care Medicine

## 2023-03-05 VITALS — BP 128/78 | HR 71

## 2023-03-05 DIAGNOSIS — K219 Gastro-esophageal reflux disease without esophagitis: Secondary | ICD-10-CM

## 2023-03-05 DIAGNOSIS — I10 Essential (primary) hypertension: Secondary | ICD-10-CM | POA: Diagnosis not present

## 2023-03-05 DIAGNOSIS — D75839 Thrombocytosis, unspecified: Secondary | ICD-10-CM

## 2023-03-05 DIAGNOSIS — E782 Mixed hyperlipidemia: Secondary | ICD-10-CM | POA: Diagnosis not present

## 2023-03-05 DIAGNOSIS — F330 Major depressive disorder, recurrent, mild: Secondary | ICD-10-CM

## 2023-03-05 DIAGNOSIS — E559 Vitamin D deficiency, unspecified: Secondary | ICD-10-CM | POA: Diagnosis not present

## 2023-03-05 NOTE — Assessment & Plan Note (Signed)
Continue as needed omeprazole and I authorize it is okay to take sodium bicarbonate tablets as needed for gas and acid

## 2023-03-05 NOTE — Assessment & Plan Note (Signed)
Follows with hematology reassess CBC

## 2023-03-05 NOTE — Assessment & Plan Note (Signed)
Reassess vitamin D level 

## 2023-03-05 NOTE — Assessment & Plan Note (Signed)
Reassess cholesterol level continue atorvastatin

## 2023-03-05 NOTE — Assessment & Plan Note (Signed)
Mental health improved off medication follow-up with behavioral health

## 2023-03-05 NOTE — Progress Notes (Signed)
1  Complete physical exam  Patient: Alicia Robinson   DOB: 1975-12-17   47 y.o. Female  MRN: [id number]  Subjective:    Chief Complaint  Patient presents with   Annual Exam    Alicia Robinson is a 47 y.o. female who presents today for a complete physical exam. She reports consuming a low fat diet. Home exercise routine includes walking 1 hrs per days. She generally feels well. She reports sleeping well. She does not have additional problems to discuss today.   Last OV 12/2022 10/2022 This is a primary care of Dr. Alvis Lemmings who comes in today with polydipsia and polyuria.  The patient had been seen on the mobile medicine unit several weeks ago.  Labs were not remarkable.  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   Patient notes increased thirst and she will drink a gallon of water a day.  This been going on for 6 months.  She denies nausea or vomiting.  She does have renal insufficiency as well with a GFR of 57.  Her vaginal bleeding complaint that she went to the mobile unit with is actually resolved itself.   Interestingly her mental health doctor has her on a very large dose of bupropion at 450 mg daily.  With her creatinine a typical dose is 150 mg daily.  She has not been screened for diabetes in 2 years.  She does tend to run a slightly high blood sugar.   11/20/22 Requesting change to PCP The patient was seen last month and had polyuria thought to be due to bupropion bupropion was stopped polyuria has resolved.  Patient comes in today requesting to change providers to my clinic.  She has seen hematology and has been diagnosed with essential thrombocytopenia/M PN   Below is documentation of the last hematology visit. Saw [facility] for thrombocythemia 10/2022 1.         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 WBC range 11.1 - 16.2, with predominant neutrophils, and platelet range 429 - 611  -10/30/22 iron panel was normal. 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 She has no other abd imaging in our system but reportedly had US done in [place] a few months ago, the report is in [place] which she can't translate at this time. We plan to monitor with repeat ABD [place] in 6 months  -I reviewed the work up and diagnosis with pt in detail, we discussed the increased risk of thrombosis with ET and other risk factors such as transformation to leukemia.  -I reviewed the case with Dr. Mosetta Putt. The recommendation is to start hydrea at low dose (last plt 449 on 2/14, no other cytopenias or CBC abnormalities) 500 mg po daily on MWF -I reviewed the potential side effects, especially fatigue, GI side effects, and cytopenias; and symptom management with pt. She agrees to proceed -Will repeat labs in 2 and 4 weeks from start, then f/up in 4 weeks   2. Atypical chest pain, h/o CVA -She had a stroke in 2016 at age 50, details are unclear whether hemorrhagic vs ischemic. She was on aspirin for 6  months then stopped. No residual deficits -She reports intermittent left chest pain, non-exertional -She went to ED in 06/2022 for this pain, work up was unremarkable. -10/30/22 exam is benign -F/up PCP  -We discussed ET may have been the cause of her stroke, given the high risk of thrombosis with ET   3. Abnormal menses -She had normal periods until 08/2022 when her period lasted 1 month -She has fatigue and lightheadedness, possibly from blood loss or perimenopause  -She started herself on oral iron in 09/2022, tolerating well -Menstrual period in Feb was much lighter and lasted 3 days    4.         Age-appropriate health maintenance -I encouraged her to continue age appropriate health maintenance and cancer screenings, avoid smoking/alcohol, and exercise.  -She is overdue for first screening colonoscopy. Per PCP   PLAN: -Reviewed recent labs including MPN panel and abd [place] -JAK2 +, she has MPN/ET -Begin Hydrea 500 mg daily on Mon/Wed/Fri -Lab in 2 and 4 weeks after starting Hydrea -F/up 4 weeks after starting  -Repeat ABD [place] in 6 months  -Case reviewed with Dr. Mosetta Putt    The patient is now on hydroxyurea 3 times weekly.  Follow-up labs are ordered.  Patient also needs a Pap smear to be scheduled.  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  The patient is followed by behavioral health and they were notified of the discontinuance of bupropion.  She does have follow-up with them.  Mental health is stable at this time.   01/03/23 This patient is seen for primary care follow-up from a March visit.  She does have thrombocytosis and is under care by hematology on hydroxyurea  3 times weekly.  Today she brings a concern that she still having neck and frontal headaches, dry mouth, snoring, URI type symptoms, body aches, eye irritation and sneezing.  These have been going on for the past month.  She also has a blackened area at the tips of her nails.  This can be associated with hydroxyurea.  The headaches have been going on some time as have been the dry mouth snoring.  She drinks 1 glass of wine a day.  She denies chronic fatigue during the day. Blood pressure on arrival is excellent and she is on the hydrochlorothiazide 12 and half milligram daily   The patient is yet to follow-up with mental health after we stopped the Wellbutrin earlier this year due to side effects   Below is the assessment from last hematology visit   1.         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 WBC range 11.1 - 16.2, with predominant neutrophils, and platelet range 429 - 611  -10/30/22 iron panel was normal. 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 She has no other abd imaging in our system but reportedly had US done in [place] a few months ago, the report is in [place] which she can't translate at this time. We plan to monitor with repeat ABD [place] in 6 months  -She began low dose hydrea 500 mg po daily on  MWF, starting 11/13/22. Tolerating well except weight gain -Ms. Caya appears stable. Plt improved from 449 to 419 on hydrea so far, hgb also decreasing but no anemia yet. Will continue same dose -No signs of thrombosis. She would like to go to [place] in July. We reviewed risk reducing behaviors. -Lab monthly, f/up in 3 months    2. Atypical chest pain, h/o CVA -She had a stroke in 2016 at age 29, details are unclear whether hemorrhagic vs ischemic. She was on aspirin for 6 months then stopped. No residual deficits -She reports intermittent left chest pain, non-exertional -She went to ED in 06/2022 for this pain, work up was unremarkable.  -10/30/22 exam is benign -F/up PCP  -We discussed ET may have been the cause of her stroke, given the high risk of thrombosis with ET -She continues to have intermittent chest pain, seen in ED 11/24/22, work up was unremarkable. I offered cardiology referral and she agreed.   3. Abnormal menses -She had normal periods until 08/2022 when her period lasted 1 month -She has fatigue and lightheadedness, possibly from blood loss or perimenopause  -She started herself on oral iron in 09/2022, tolerating well -Menstrual period in Feb  was much lighter and lasted 3 days   4.         Age-appropriate health maintenance -I encouraged her to continue age appropriate health maintenance and cancer screenings, avoid smoking/alcohol, and exercise.  -She is overdue for first screening colonoscopy. Per PCP   PLAN: -Labs reviewed -Continue low dose hydrea 500 mg on MWF (3x per week) -Discussed testing for family members and thrombosis precautions -Lab monthly and f/up in 3 months     03/05/23 Patient seen in return follow-up he needs a physical exam.  She states all of her multiple complaints from the last visit are now resolved.  She is no longer having polyuria is no longer having headaches.  She does have some knee and back pain.  We need to follow-up her blood counts cholesterol and vitamin D levels.  Most recent fall risk assessment:    03/05/2023   10:26 AM  Fall Risk   Falls in the past year? 0  Number falls in past yr: 0  Injury with Fall? 0  Risk for fall due to : No Fall Risks     Most recent depression screenings:    03/05/2023   10:26 AM 01/02/2023    9:06 AM  PHQ 2/9 Scores  PHQ - 2 Score 0 1  PHQ- 9 Score  8        Patient Care Team: [name], MD as PCP - General (Pulmonary Disease)   Outpatient Medications Prior to Visit  Medication Sig   ascorbic acid (VITAMIN C) 500 MG tablet Take 500 mg by mouth daily.   atorvastatin (LIPITOR) 20 MG tablet Take 1 tablet (20 mg total) by mouth daily.   azelastine (ASTELIN) 0.1 % nasal spray Place 2 sprays into both nostrils 2 (two) times daily. Use in each nostril as directed   ferrous sulfate 324 MG TBEC Take 324 mg by mouth daily with breakfast.   hydroxyurea (HYDREA) 500 MG capsule Take 1 capsule (500 mg total) by mouth every Monday, Wednesday, and Friday.May take with food to minimize GI side effects.   Multiple Vitamins-Minerals (HAIR SKIN AND NAILS FORMULA) TABS Take 1 tablet by mouth daily.   olopatadine (PATANOL) 0.1 % ophthalmic solution Place  1 drop into both eyes 2 (two) times daily.  omeprazole (PRILOSEC) 20 MG capsule Take 1 capsule (20 mg total) by mouth daily.   traZODone (DESYREL) 50 MG tablet Take 1-2 tablets (50-100 mg total) by mouth at bedtime as needed for sleep.   Turmeric (QC TUMERIC COMPLEX) 500 MG CAPS Take 2 tablets by mouth daily.   Vitamin D, Ergocalciferol, (DRISDOL) 1.25 MG (50000 UNIT) CAPS capsule Take 1 capsule (50,000 Units total) by mouth every 7 (seven) days.   [DISCONTINUED] hydrochlorothiazide (HYDRODIURIL) 25 MG tablet Take 0.5 tablets (12.5 mg total) by mouth daily.   No facility-administered medications prior to visit.    Review of Systems  Constitutional:  Negative for chills, diaphoresis, fever, malaise/fatigue and weight loss.  HENT:  Negative for congestion, hearing loss, nosebleeds, sore throat and tinnitus.   Eyes:  Negative for blurred vision, photophobia and redness.  Respiratory:  Negative for cough, hemoptysis, sputum production, shortness of breath, wheezing and stridor.   Cardiovascular:  Negative for chest pain, palpitations, orthopnea, claudication, leg swelling and PND.  Gastrointestinal:  Negative for abdominal pain, blood in stool, constipation, diarrhea, heartburn, nausea and vomiting.  Genitourinary:  Negative for dysuria, flank pain, frequency, hematuria and urgency.  Musculoskeletal:  Negative for back pain, falls, joint pain, myalgias and neck pain.  Skin:  Negative for itching and rash.  Neurological:  Negative for dizziness, tingling, tremors, sensory change, speech change, focal weakness, seizures, loss of consciousness, weakness and headaches.  Endo/Heme/Allergies:  Negative for environmental allergies and polydipsia. Does not bruise/bleed easily.  Psychiatric/Behavioral:  Negative for depression, memory loss, substance abuse and suicidal ideas. The patient is not nervous/anxious and does not have insomnia.           Objective:     BP 128/78   Pulse 71   SpO2 99%     Physical Exam Vitals reviewed.  Constitutional:      Appearance: Normal appearance. She is well-developed. She is not diaphoretic.  HENT:     Head: Normocephalic and atraumatic.     Nose: No nasal deformity, septal deviation, mucosal edema or rhinorrhea.     Right Sinus: No maxillary sinus tenderness or frontal sinus tenderness.     Left Sinus: No maxillary sinus tenderness or frontal sinus tenderness.     Mouth/Throat:     Pharynx: No oropharyngeal exudate.  Eyes:     General: No scleral icterus.    Conjunctiva/sclera: Conjunctivae normal.     Pupils: Pupils are equal, round, and reactive to light.  Neck:     Thyroid: No thyromegaly.     Vascular: No carotid bruit or JVD.     Trachea: Trachea normal. No tracheal tenderness or tracheal deviation.  Cardiovascular:     Rate and Rhythm: Normal rate and regular rhythm.     Chest Wall: PMI is not displaced.     Pulses: Normal pulses. No decreased pulses.     Heart sounds: Normal heart sounds, S1 normal and S2 normal. Heart sounds not distant. No murmur heard.    No systolic murmur is present.     No diastolic murmur is present.     No friction rub. No gallop. No S3 or S4 sounds.  Pulmonary:     Effort: No tachypnea, accessory muscle usage or respiratory distress.     Breath sounds: No stridor. No decreased breath sounds, wheezing, rhonchi or rales.  Chest:     Chest wall: No tenderness.  Abdominal:     General: Bowel sounds are normal. There is no distension.  Palpations: Abdomen is soft. Abdomen is not rigid.     Tenderness: There is no abdominal tenderness. There is no guarding or rebound.  Musculoskeletal:        General: Normal range of motion.     Cervical back: Normal range of motion and neck supple. No edema, erythema or rigidity. No muscular tenderness. Normal range of motion.  Lymphadenopathy:     Head:     Right side of head: No submental or submandibular adenopathy.     Left side of head: No submental or  submandibular adenopathy.     Cervical: No cervical adenopathy.  Skin:    General: Skin is warm and dry.     Coloration: Skin is not pale.     Findings: No rash.     Nails: There is no clubbing.  Neurological:     Mental Status: She is alert and oriented to person, place, and time.     Sensory: No sensory deficit.  Psychiatric:        Speech: Speech normal.        Behavior: Behavior normal.      No results found for any visits on 03/05/23.     Assessment & Plan:    Routine Health Maintenance and Physical Exam  Immunization History  Administered Date(s) Administered   PFIZER(Purple Top)SARS-COV-2 Vaccination 01/12/2020, 02/02/2020   Tdap 11/20/2022    Health Maintenance  Topic Date Due   COVID-19 Vaccine (3 - Pfizer risk series) 03/01/2020   INFLUENZA VACCINE  04/17/2023   MAMMOGRAM  07/13/2023   PAP SMEAR-Modifier  12/17/2025   Colonoscopy  08/16/2031   DTaP/Tdap/Td (2 - Td or Tdap) 11/19/2032   Hepatitis C Screening  Completed   HIV Screening  Completed   HPV VACCINES  Aged Out    Discussed health benefits of physical activity, and encouraged her to engage in regular exercise appropriate for her age and condition.  Problem List Items Addressed This Visit       Cardiovascular and Mediastinum   HTN (hypertension) - Primary    Off medication blood pressure is at goal we will hold hydrochlorothiazide for now      Relevant Orders   BMP8+eGFR     Digestive   GERD (gastroesophageal reflux disease)    Continue as needed omeprazole and I authorize it is okay to take sodium bicarbonate tablets as needed for gas and acid        Hematopoietic and Hemostatic   Thrombocytosis    Follows with hematology reassess CBC      Relevant Orders   CBC with Differential/Platelet     Other   Hyperlipidemia    Reassess cholesterol level continue atorvastatin      Relevant Orders   Lipid panel   MDD (major depressive disorder)    Mental health improved off medication  follow-up with behavioral health      Vitamin D insufficiency    Reassess vitamin D level      Relevant Orders   VITAMIN D 25 Hydroxy (Vit-D Deficiency, Fractures)   Return in about 6 months (around 09/04/2023) for htn, primary care follow up.     Shan Levans, MD

## 2023-03-05 NOTE — Patient Instructions (Signed)
Stop hydrochlorothiazide  No other medication changes  I am okay if you take the bicarbonate supplement  Blood counts cholesterol vitamin D will be checked today along with kidney function  See exercises for your knee and back attached  Return to Dr. Delford Field 6 months

## 2023-03-05 NOTE — Assessment & Plan Note (Signed)
Off medication blood pressure is at goal we will hold hydrochlorothiazide for now

## 2023-03-06 ENCOUNTER — Telehealth: Payer: Self-pay | Admitting: Critical Care Medicine

## 2023-03-06 DIAGNOSIS — I10 Essential (primary) hypertension: Secondary | ICD-10-CM

## 2023-03-06 DIAGNOSIS — E559 Vitamin D deficiency, unspecified: Secondary | ICD-10-CM

## 2023-03-06 DIAGNOSIS — E782 Mixed hyperlipidemia: Secondary | ICD-10-CM

## 2023-03-06 LAB — LIPID PANEL
Chol/HDL Ratio: 5.1 ratio — ABNORMAL HIGH (ref 0.0–4.4)
Cholesterol, Total: 173 mg/dL (ref 100–199)
HDL: 34 mg/dL — ABNORMAL LOW (ref >39)
LDL Chol Calc (NIH): 107 mg/dL — ABNORMAL HIGH (ref 0–99)
Triglycerides: 185 mg/dL — ABNORMAL HIGH (ref 0–149)
VLDL Cholesterol Cal: 32 mg/dL (ref 5–40)

## 2023-03-06 LAB — BMP8+EGFR
BUN/Creatinine Ratio: 19 (ref 9–23)
BUN: 9 mg/dL (ref 6–24)
CO2: 23 mmol/L (ref 20–29)
Calcium: 9.1 mg/dL (ref 8.7–10.2)
Chloride: 98 mmol/L (ref 96–106)
Creatinine, Ser: 0.47 mg/dL — ABNORMAL LOW (ref 0.57–1.00)
Glucose: 296 mg/dL — ABNORMAL HIGH (ref 70–99)
Potassium: 4.2 mmol/L (ref 3.5–5.2)
Sodium: 135 mmol/L (ref 134–144)
eGFR: 117 mL/min/{1.73_m2} (ref >59)

## 2023-03-06 LAB — CBC WITH DIFFERENTIAL/PLATELET
Basophils Absolute: 0.1 10*3/uL (ref 0.0–0.2)
Basos: 1 %
EOS (ABSOLUTE): 0.2 10*3/uL (ref 0.0–0.4)
Eos: 3 %
Hematocrit: 44.5 % (ref 34.0–46.6)
Hemoglobin: 14.8 g/dL (ref 11.1–15.9)
Immature Grans (Abs): 0 10*3/uL (ref 0.0–0.1)
Immature Granulocytes: 0 %
Lymphocytes Absolute: 2.4 10*3/uL (ref 0.7–3.1)
Lymphs: 29 %
MCH: 31.8 pg (ref 26.6–33.0)
MCHC: 33.3 g/dL (ref 31.5–35.7)
MCV: 96 fL (ref 79–97)
Monocytes Absolute: 0.8 10*3/uL (ref 0.1–0.9)
Monocytes: 9 %
Neutrophils Absolute: 4.9 10*3/uL (ref 1.4–7.0)
Neutrophils: 58 %
Platelets: 432 10*3/uL (ref 150–450)
RBC: 4.66 x10E6/uL (ref 3.77–5.28)
RDW: 14.2 % (ref 11.7–15.4)
WBC: 8.4 10*3/uL (ref 3.4–10.8)

## 2023-03-06 LAB — VITAMIN D 25 HYDROXY (VIT D DEFICIENCY, FRACTURES): Vit D, 25-Hydroxy: 19 ng/mL — ABNORMAL LOW (ref 30.0–100.0)

## 2023-03-06 NOTE — Telephone Encounter (Signed)
Noted  

## 2023-03-06 NOTE — Telephone Encounter (Signed)
I spoke to the patient about her lab results they do not seem to be concordant with her condition.  I am concerned they were processed incorrectly.  I would like her to come in for a repeat set of blood draws I will put the orders in and she should not be billed for the repeat labs  She wishes to come in next Tuesday for her blood

## 2023-03-06 NOTE — Progress Notes (Signed)
I called the patient and she is aware her lab results are not congruent with her medical conditions she does not have diabetes.  She will return next week for repeat blood draw at no cost.  She understood and was happy with the phone call notification and will return next week for repeat blood draw

## 2023-03-10 ENCOUNTER — Inpatient Hospital Stay: Payer: BC Managed Care – PPO | Admitting: Nurse Practitioner

## 2023-03-10 ENCOUNTER — Inpatient Hospital Stay: Payer: BC Managed Care – PPO

## 2023-03-11 ENCOUNTER — Ambulatory Visit: Payer: BC Managed Care – PPO | Attending: Critical Care Medicine

## 2023-03-11 DIAGNOSIS — E559 Vitamin D deficiency, unspecified: Secondary | ICD-10-CM

## 2023-03-11 DIAGNOSIS — I10 Essential (primary) hypertension: Secondary | ICD-10-CM

## 2023-03-11 DIAGNOSIS — E782 Mixed hyperlipidemia: Secondary | ICD-10-CM

## 2023-03-12 LAB — CBC WITH DIFFERENTIAL/PLATELET
Basophils Absolute: 0.1 10*3/uL (ref 0.0–0.2)
Basos: 1 %
EOS (ABSOLUTE): 0.3 10*3/uL (ref 0.0–0.4)
Eos: 4 %
Hematocrit: 39.4 % (ref 34.0–46.6)
Hemoglobin: 13.1 g/dL (ref 11.1–15.9)
Immature Grans (Abs): 0 10*3/uL (ref 0.0–0.1)
Immature Granulocytes: 0 %
Lymphocytes Absolute: 2.5 10*3/uL (ref 0.7–3.1)
Lymphs: 31 %
MCH: 31.5 pg (ref 26.6–33.0)
MCHC: 33.2 g/dL (ref 31.5–35.7)
MCV: 95 fL (ref 79–97)
Monocytes Absolute: 0.7 10*3/uL (ref 0.1–0.9)
Monocytes: 9 %
Neutrophils Absolute: 4.5 10*3/uL (ref 1.4–7.0)
Neutrophils: 55 %
Platelets: 461 10*3/uL — ABNORMAL HIGH (ref 150–450)
RBC: 4.16 x10E6/uL (ref 3.77–5.28)
RDW: 13.8 % (ref 11.7–15.4)
WBC: 8 10*3/uL (ref 3.4–10.8)

## 2023-03-12 LAB — COMPREHENSIVE METABOLIC PANEL
ALT: 25 IU/L (ref 0–32)
AST: 21 IU/L (ref 0–40)
Albumin: 4 g/dL (ref 3.9–4.9)
Alkaline Phosphatase: 55 IU/L (ref 44–121)
BUN/Creatinine Ratio: 18 (ref 9–23)
BUN: 19 mg/dL (ref 6–24)
Bilirubin Total: 0.3 mg/dL (ref 0.0–1.2)
CO2: 25 mmol/L (ref 20–29)
Calcium: 9.3 mg/dL (ref 8.7–10.2)
Chloride: 104 mmol/L (ref 96–106)
Creatinine, Ser: 1.08 mg/dL — ABNORMAL HIGH (ref 0.57–1.00)
Globulin, Total: 2.1 g/dL (ref 1.5–4.5)
Glucose: 84 mg/dL (ref 70–99)
Potassium: 5.3 mmol/L — ABNORMAL HIGH (ref 3.5–5.2)
Sodium: 140 mmol/L (ref 134–144)
Total Protein: 6.1 g/dL (ref 6.0–8.5)
eGFR: 63 mL/min/{1.73_m2} (ref >59)

## 2023-03-12 LAB — LIPID PANEL
Chol/HDL Ratio: 2.3 ratio (ref 0.0–4.4)
Cholesterol, Total: 159 mg/dL (ref 100–199)
HDL: 69 mg/dL (ref >39)
LDL Chol Calc (NIH): 79 mg/dL (ref 0–99)
Triglycerides: 51 mg/dL (ref 0–149)
VLDL Cholesterol Cal: 11 mg/dL (ref 5–40)

## 2023-03-12 LAB — VITAMIN D 25 HYDROXY (VIT D DEFICIENCY, FRACTURES): Vit D, 25-Hydroxy: 56 ng/mL (ref 30.0–100.0)

## 2023-03-12 NOTE — Progress Notes (Signed)
Let pt know all labs normal  she does NOT have diabetes, cholesterol is normal, vit D now normal  Stay on cholesterol pill and Vit D supplement

## 2023-03-13 ENCOUNTER — Telehealth: Payer: Self-pay

## 2023-03-13 NOTE — Telephone Encounter (Signed)
-----   Message from [name], MD sent at 03/12/2023  5:46 AM EDT ----- Let pt know all labs normal  she does NOT have diabetes, cholesterol is normal, vit D now normal  Stay on cholesterol pill and Vit D supplement

## 2023-03-13 NOTE — Telephone Encounter (Signed)
Pt was called and is aware of results, DOB was confirmed.  ?

## 2023-03-22 ENCOUNTER — Other Ambulatory Visit (HOSPITAL_COMMUNITY): Payer: Self-pay

## 2023-03-23 NOTE — Progress Notes (Unsigned)
Patient Care Team: [name], MD as PCP - General (Pulmonary Disease)   CHIEF COMPLAINT: Follow up essential thrombocythemia/ET  CURRENT THERAPY: Hydrea 500 mg 3x/week (MWF) starting 11/14/22, increased to 5x/week (M-F) starting 01/22/23  INTERVAL HISTORY Alicia Robinson returns for follow up as scheduled. Last seen by me 01/06/23. Labs 01/22/23 showed persistent high plt, Hydrea was increased to 500 mg M-F.  She notes the pharmacy only gives her 12 tablets at a time.  She is doing well overall, no significant changes in her health except some mild body aches.  PCP gave her exercises.  She also has mild intermittent pain in the left upper arm when she does certain motions.  This seems more muscular than joint related.  Denies obvious injury or strain.  Otherwise doing well.  Denies bruising/bleeding.  Bowels moving normally.  Denies cough, chest pain, dyspnea or signs of thrombosis.  ROS  All other systems reviewed and negative  Past Medical History:  Diagnosis Date   Anxiety    Depression    Drug reaction 10/22/2022   Hyperlipidemia    Hypertension    Stroke [facility]) 2016   "mild"   Suicidal overdose (HCC)      Past Surgical History:  Procedure Laterality Date   CHOLECYSTECTOMY     SIGMOIDOSCOPY     pt is unsure about this   TUBAL LIGATION     UPPER GASTROINTESTINAL ENDOSCOPY       Outpatient Encounter Medications as of 03/26/2023  Medication Sig   ascorbic acid (VITAMIN C) 500 MG tablet Take 500 mg by mouth daily.   atorvastatin (LIPITOR) 20 MG tablet Take 1 tablet (20 mg total) by mouth daily.   azelastine (ASTELIN) 0.1 % nasal spray Place 2 sprays into both nostrils 2 (two) times daily. Use in each nostril as directed   Multiple Vitamins-Minerals (HAIR SKIN AND NAILS FORMULA) TABS Take 1 tablet by mouth daily.   olopatadine (PATANOL) 0.1 % ophthalmic solution Place 1 drop into both eyes 2 (two) times daily.   omeprazole (PRILOSEC) 20 MG capsule Take 1 capsule (20 mg total)  by mouth daily.   traZODone (DESYREL) 50 MG tablet Take 1-2 tablets (50-100 mg total) by mouth at bedtime as needed for sleep.   Turmeric (QC TUMERIC COMPLEX) 500 MG CAPS Take 2 tablets by mouth daily.   Vitamin D, Ergocalciferol, (DRISDOL) 1.25 MG (50000 UNIT) CAPS capsule Take 1 capsule (50,000 Units total) by mouth every 7 (seven) days.   [DISCONTINUED] ferrous sulfate 324 MG TBEC Take 324 mg by mouth daily with breakfast.   [DISCONTINUED] hydroxyurea (HYDREA) 500 MG capsule Take 1 capsule (500 mg total) by mouth every Monday, Wednesday, and Friday.May take with food to minimize GI side effects.   ferrous sulfate 324 MG TBEC Take 1 tablet (324 mg total) by mouth daily with breakfast.   hydroxyurea (HYDREA) 500 MG capsule Take 1 capsule (500 mg total) by mouth as directed. 5 days per week M-F   No facility-administered encounter medications on file as of 03/26/2023.     Today's Vitals   03/26/23 0907  BP: (!) 137/91  Pulse: 79  Resp: 17  Temp: 100.2 F (37.9 C)  TempSrc: Oral  SpO2: 100%  Weight: 201 lb (91.2 kg)   Body mass index is 28.84 kg/m.   PHYSICAL EXAM GENERAL:alert, no distress and comfortable SKIN: no rash  EYES: sclera clear LUNGS: clear with normal breathing effort HEART: regular rate & rhythm, no lower extremity edema ABDOMEN:  abdomen soft, non-tender and normal bowel sounds MSK: normal strength/tone. No LUE edema or abnormality. No ttp NEURO: alert & oriented x 3 with fluent speech, no focal motor/sensory deficits   CBC    Component Value Date/Time   WBC 7.8 03/26/2023 0837   WBC 11.0 (H) 11/24/2022 1627   RBC 4.01 03/26/2023 0837   HGB 13.2 03/26/2023 0837   HGB 13.1 03/11/2023 1108   HCT 38.3 03/26/2023 0837   HCT 39.4 03/11/2023 1108   PLT 356 03/26/2023 0837   PLT 461 (H) 03/11/2023 1108   MCV 95.5 03/26/2023 0837   MCV 95 03/11/2023 1108   MCH 32.9 03/26/2023 0837   MCHC 34.5 03/26/2023 0837   RDW 14.2 03/26/2023 0837   RDW 13.8 03/11/2023  1108   LYMPHSABS 2.4 03/26/2023 0837   LYMPHSABS 2.5 03/11/2023 1108   MONOABS 0.7 03/26/2023 0837   EOSABS 0.2 03/26/2023 0837   EOSABS 0.3 03/11/2023 1108   BASOSABS 0.0 03/26/2023 0837   BASOSABS 0.1 03/11/2023 1108     CMP     Component Value Date/Time   NA 140 03/26/2023 0837   NA 140 03/11/2023 1108   K 4.5 03/26/2023 0837   CL 108 03/26/2023 0837   CO2 28 03/26/2023 0837   GLUCOSE 84 03/26/2023 0837   BUN 16 03/26/2023 0837   BUN 19 03/11/2023 1108   CREATININE 0.95 03/26/2023 0837   CALCIUM 9.2 03/26/2023 0837   PROT 6.8 03/26/2023 0837   PROT 6.1 03/11/2023 1108   ALBUMIN 3.8 03/26/2023 0837   ALBUMIN 4.0 03/11/2023 1108   AST 27 03/26/2023 0837   ALT 37 03/26/2023 0837   ALKPHOS 46 03/26/2023 0837   BILITOT 0.5 03/26/2023 0837   GFRNONAA >60 03/26/2023 0837   GFRAA >60 06/07/2020 1541     ASSESSMENT & PLAN:47 year old female   MPN/Essential Thrombocythemia (ET), 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 WBC range 11.1 - 16.2, with predominant neutrophils, and platelet range 429 - 611  -10/30/22 iron panel was normal. 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 She has no other abd imaging in our system but reportedly had US done in [place] earlier in 2024; I don't have access and the report is in Spanish which she can't translate at this time. We plan to monitor with repeat ABD [place] in 6 months  -She began low dose hydrea 500 mg po daily on MWF, starting 11/13/22. Tolerating well except weight gain, plt count improved in the first month then fluctuated, remained >450 and hydrea was increased to 5 days per week M-F in 01/2023 -Alicia Robinson is clinically doing  well. Has some body and LUE aches, exam is benign. I reviewed SE profile for hydrea, arthralgia and limp pain is listed 3-9%; could be related. Overall tolerable. Otherwise tolerating well -Labs reviewed, CBC is normal, plt in normal range, continue same dose hydrea 500 mg M-F 5 days per week. -Lab q2 months, f/up in 6 months, or sooner if needed   2. Atypical chest pain, h/o CVA -She had a stroke in 2016 at age 26, details are unclear whether hemorrhagic vs ischemic. She was on aspirin for 6 months then stopped. No residual deficits -She reports intermittent left chest  pain, non-exertional -She went to ED in 06/2022 for this pain, work up was unremarkable.  -10/30/22 exam is benign -F/up PCP  -We discussed ET may have been the cause of her stroke, given the high risk of thrombosis with ET -She continues to have intermittent chest pain, seen in ED 11/24/22, work up was unremarkable. I referred her to cardiology -No CP recently    3. Age-appropriate health maintenance -I encouraged her to continue age appropriate health maintenance and cancer screenings, avoid smoking/alcohol, and exercise.  -She is overdue for first screening colonoscopy. Per PCP    PLAN: -Labs reviewed, plt 356 normal; continue same dose hydrea 500 mg 5 days /week M-F -Continue symptom management -Lab q2 months -F/up in 6 months, or sooner if needed   All questions were answered. The patient knows to call the clinic with any problems, questions or concerns. No barriers to learning were detected.  Santiago Glad, NP-C 03/26/2023

## 2023-03-24 ENCOUNTER — Other Ambulatory Visit (HOSPITAL_COMMUNITY): Payer: Self-pay

## 2023-03-24 ENCOUNTER — Other Ambulatory Visit: Payer: Self-pay

## 2023-03-25 ENCOUNTER — Other Ambulatory Visit (HOSPITAL_COMMUNITY): Payer: Self-pay

## 2023-03-26 ENCOUNTER — Other Ambulatory Visit (HOSPITAL_COMMUNITY): Payer: Self-pay

## 2023-03-26 ENCOUNTER — Inpatient Hospital Stay: Payer: BC Managed Care – PPO | Admitting: Nurse Practitioner

## 2023-03-26 ENCOUNTER — Encounter: Payer: Self-pay | Admitting: Nurse Practitioner

## 2023-03-26 ENCOUNTER — Inpatient Hospital Stay: Payer: BC Managed Care – PPO | Attending: Nurse Practitioner

## 2023-03-26 ENCOUNTER — Other Ambulatory Visit: Payer: Self-pay

## 2023-03-26 VITALS — BP 137/91 | HR 79 | Temp 100.2°F | Resp 17 | Wt 201.0 lb

## 2023-03-26 DIAGNOSIS — D473 Essential (hemorrhagic) thrombocythemia: Secondary | ICD-10-CM | POA: Insufficient documentation

## 2023-03-26 DIAGNOSIS — R0789 Other chest pain: Secondary | ICD-10-CM | POA: Diagnosis not present

## 2023-03-26 DIAGNOSIS — M79622 Pain in left upper arm: Secondary | ICD-10-CM | POA: Diagnosis not present

## 2023-03-26 DIAGNOSIS — Z8673 Personal history of transient ischemic attack (TIA), and cerebral infarction without residual deficits: Secondary | ICD-10-CM | POA: Diagnosis not present

## 2023-03-26 LAB — CMP (CANCER CENTER ONLY)
ALT: 37 U/L (ref 0–44)
AST: 27 U/L (ref 15–41)
Albumin: 3.8 g/dL (ref 3.5–5.0)
Alkaline Phosphatase: 46 U/L (ref 38–126)
Anion gap: 4 — ABNORMAL LOW (ref 5–15)
BUN: 16 mg/dL (ref 6–20)
CO2: 28 mmol/L (ref 22–32)
Calcium: 9.2 mg/dL (ref 8.9–10.3)
Chloride: 108 mmol/L (ref 98–111)
Creatinine: 0.95 mg/dL (ref 0.44–1.00)
GFR, Estimated: >60 mL/min (ref >60)
Glucose, Bld: 84 mg/dL (ref 70–99)
Potassium: 4.5 mmol/L (ref 3.5–5.1)
Sodium: 140 mmol/L (ref 135–145)
Total Bilirubin: 0.5 mg/dL (ref 0.3–1.2)
Total Protein: 6.8 g/dL (ref 6.5–8.1)

## 2023-03-26 LAB — CBC WITH DIFFERENTIAL (CANCER CENTER ONLY)
Abs Immature Granulocytes: 0.03 10*3/uL (ref 0.00–0.07)
Basophils Absolute: 0 10*3/uL (ref 0.0–0.1)
Basophils Relative: 1 %
Eosinophils Absolute: 0.2 10*3/uL (ref 0.0–0.5)
Eosinophils Relative: 3 %
HCT: 38.3 % (ref 36.0–46.0)
Hemoglobin: 13.2 g/dL (ref 12.0–15.0)
Immature Granulocytes: 0 %
Lymphocytes Relative: 31 %
Lymphs Abs: 2.4 10*3/uL (ref 0.7–4.0)
MCH: 32.9 pg (ref 26.0–34.0)
MCHC: 34.5 g/dL (ref 30.0–36.0)
MCV: 95.5 fL (ref 80.0–100.0)
Monocytes Absolute: 0.7 10*3/uL (ref 0.1–1.0)
Monocytes Relative: 9 %
Neutro Abs: 4.4 10*3/uL (ref 1.7–7.7)
Neutrophils Relative %: 56 %
Platelet Count: 356 10*3/uL (ref 150–400)
RBC: 4.01 MIL/uL (ref 3.87–5.11)
RDW: 14.2 % (ref 11.5–15.5)
WBC Count: 7.8 10*3/uL (ref 4.0–10.5)
nRBC: 0 % (ref 0.0–0.2)

## 2023-03-26 MED ORDER — FERROUS SULFATE 324 MG PO TBEC
324.0000 mg | DELAYED_RELEASE_TABLET | Freq: Every day | ORAL | 3 refills | Status: DC
  Filled 2023-03-26: qty 100, 100d supply, fill #0
  Filled 2023-11-03: qty 20, 20d supply, fill #1

## 2023-03-26 MED ORDER — HYDROXYUREA 500 MG PO CAPS
500.0000 mg | ORAL_CAPSULE | ORAL | 2 refills | Status: DC
  Filled 2023-03-26 (×2): qty 20, 28d supply, fill #0
  Filled 2023-04-19 – 2023-04-25 (×3): qty 20, 28d supply, fill #1
  Filled 2023-05-19: qty 20, 28d supply, fill #2

## 2023-03-27 ENCOUNTER — Other Ambulatory Visit (HOSPITAL_COMMUNITY): Payer: Self-pay

## 2023-03-27 ENCOUNTER — Other Ambulatory Visit: Payer: Self-pay

## 2023-03-27 ENCOUNTER — Encounter: Payer: Self-pay | Admitting: Pharmacist

## 2023-04-02 ENCOUNTER — Other Ambulatory Visit (HOSPITAL_COMMUNITY): Payer: Self-pay

## 2023-04-19 ENCOUNTER — Other Ambulatory Visit (HOSPITAL_COMMUNITY): Payer: Self-pay

## 2023-04-21 ENCOUNTER — Other Ambulatory Visit: Payer: Self-pay

## 2023-04-24 ENCOUNTER — Encounter (HOSPITAL_COMMUNITY): Payer: Self-pay

## 2023-04-25 ENCOUNTER — Other Ambulatory Visit: Payer: Self-pay

## 2023-04-25 ENCOUNTER — Other Ambulatory Visit (HOSPITAL_COMMUNITY): Payer: Self-pay

## 2023-04-29 ENCOUNTER — Other Ambulatory Visit (HOSPITAL_COMMUNITY): Payer: Self-pay

## 2023-05-08 ENCOUNTER — Encounter: Payer: Self-pay | Admitting: Critical Care Medicine

## 2023-05-08 ENCOUNTER — Ambulatory Visit: Payer: BC Managed Care – PPO | Attending: Critical Care Medicine | Admitting: Critical Care Medicine

## 2023-05-08 ENCOUNTER — Other Ambulatory Visit: Payer: Self-pay

## 2023-05-08 VITALS — BP 119/78 | HR 69 | Ht 70.0 in | Wt 202.8 lb

## 2023-05-08 DIAGNOSIS — E785 Hyperlipidemia, unspecified: Secondary | ICD-10-CM

## 2023-05-08 DIAGNOSIS — K219 Gastro-esophageal reflux disease without esophagitis: Secondary | ICD-10-CM

## 2023-05-08 DIAGNOSIS — E559 Vitamin D deficiency, unspecified: Secondary | ICD-10-CM

## 2023-05-08 DIAGNOSIS — I10 Essential (primary) hypertension: Secondary | ICD-10-CM

## 2023-05-08 DIAGNOSIS — D75839 Thrombocytosis, unspecified: Secondary | ICD-10-CM

## 2023-05-08 MED ORDER — OMEPRAZOLE 20 MG PO CPDR
20.0000 mg | DELAYED_RELEASE_CAPSULE | Freq: Every day | ORAL | 2 refills | Status: DC
Start: 2023-05-08 — End: 2023-12-22
  Filled 2023-05-08: qty 90, 90d supply, fill #0
  Filled 2023-05-19: qty 30, 30d supply, fill #0
  Filled 2023-06-22: qty 30, 30d supply, fill #1
  Filled 2023-07-16: qty 30, 30d supply, fill #2
  Filled 2023-07-25 – 2023-08-17 (×2): qty 30, 30d supply, fill #3
  Filled 2023-09-16: qty 30, 30d supply, fill #4

## 2023-05-08 MED ORDER — VITAMIN D (ERGOCALCIFEROL) 1.25 MG (50000 UNIT) PO CAPS
50000.0000 [IU] | ORAL_CAPSULE | ORAL | 3 refills | Status: DC
  Filled 2023-05-08: qty 14, 98d supply, fill #0
  Filled 2023-05-19: qty 4, 28d supply, fill #0
  Filled 2023-06-22: qty 4, 28d supply, fill #1
  Filled 2023-07-16: qty 4, 28d supply, fill #2
  Filled 2023-07-25 – 2023-08-29 (×3): qty 4, 28d supply, fill #3
  Filled 2023-09-16 – 2023-09-21 (×3): qty 4, 28d supply, fill #4
  Filled 2023-10-19 – 2023-10-23 (×2): qty 4, 28d supply, fill #5
  Filled 2023-11-16: qty 4, 28d supply, fill #6
  Filled 2023-12-19: qty 4, 28d supply, fill #7
  Filled 2024-01-17 – 2024-02-24 (×3): qty 4, 28d supply, fill #8
  Filled 2024-03-11 – 2024-03-15 (×2): qty 4, 28d supply, fill #9
  Filled 2024-04-13: qty 4, 28d supply, fill #10

## 2023-05-08 MED ORDER — ATORVASTATIN CALCIUM 20 MG PO TABS
20.0000 mg | ORAL_TABLET | Freq: Every day | ORAL | 1 refills | Status: DC
Start: 2023-05-08 — End: 2023-10-05
  Filled 2023-05-08: qty 90, 90d supply, fill #0
  Filled 2023-05-19: qty 30, 30d supply, fill #0
  Filled 2023-05-20: qty 90, 90d supply, fill #0
  Filled 2023-05-28: qty 30, 30d supply, fill #0
  Filled 2023-06-26: qty 30, 30d supply, fill #1
  Filled 2023-07-16 – 2023-07-25 (×2): qty 30, 30d supply, fill #2
  Filled 2023-08-17: qty 30, 30d supply, fill #3
  Filled 2023-09-16: qty 30, 30d supply, fill #4

## 2023-05-08 NOTE — Assessment & Plan Note (Signed)
Controlled off medication with lifestyle medicine approach

## 2023-05-08 NOTE — Assessment & Plan Note (Signed)
Continue vitamin D supplement

## 2023-05-08 NOTE — Patient Instructions (Signed)
No changes in medications future refills given to extend out 6 months Return 6 months

## 2023-05-08 NOTE — Progress Notes (Signed)
1  Established office visit  Patient: Alicia Robinson   DOB: Jan 23, 1976   47 y.o. Female  MRN: [id number]  Subjective:    Chief Complaint  Patient presents with   Medical Management of Chronic Issues    No concerns    Last OV 12/2022 10/2022 This is a primary care of Dr. Alvis Lemmings who comes in today with polydipsia and polyuria.  The patient had been seen on the mobile medicine unit several weeks ago.  Labs were not remarkable.  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   Patient notes increased thirst and she will drink a gallon of water a day.  This been going on for 6 months.  She denies nausea or vomiting.  She does have renal insufficiency as well with a GFR of 57.  Her vaginal bleeding complaint that she went to the mobile unit with is actually resolved itself.   Interestingly her mental health doctor has her on a very large dose of bupropion at 450 mg daily.  With her creatinine a typical dose is 150 mg daily.  She has not been screened for diabetes in 2 years.  She does tend to run a slightly high blood sugar.   11/20/22 Requesting change to PCP The patient was seen last month and had polyuria thought to be due to bupropion bupropion was stopped polyuria has resolved.  Patient comes in today requesting to change providers to my clinic.  She has seen hematology and has been diagnosed with essential thrombocytopenia/M PN   Below is documentation of the last hematology visit. Saw [facility] for thrombocythemia 10/2022 1.         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 WBC range 11.1 - 16.2, with predominant neutrophils, and platelet range 429 - 611  -10/30/22 iron panel was normal. She is on oral iron for perimenopausal bleeding which is heavy at times -Further  work up showed JAK2 Val617Phe, which confirms essential thrombocythemia/MPN -ABD US showed small lesion on the spleen which is favored benign hemangioma. She has no other abd imaging in our system but reportedly had US done in [place] a few months ago, the report is in [place] which she can't translate at this time. We plan to monitor with repeat ABD [place] in 6 months  -I reviewed the work up and diagnosis with pt in detail, we discussed the increased risk of thrombosis with ET and other risk factors such as transformation to leukemia.  -I reviewed the case with Dr. Mosetta Putt. The recommendation is to start hydrea at low dose (last plt 449 on 2/14, no other cytopenias or CBC abnormalities) 500 mg po daily on MWF -I reviewed the potential side effects, especially fatigue, GI side effects, and cytopenias; and symptom management with pt. She agrees to proceed -Will repeat labs in 2 and 4 weeks from start, then f/up in 4 weeks   2. Atypical chest pain, h/o CVA -She had a stroke in 2016 at age 89, details are unclear whether hemorrhagic vs ischemic. She was on aspirin for 6 months then stopped. No residual deficits -She reports intermittent left chest pain, non-exertional -She went to ED in 06/2022 for this pain, work up was unremarkable. -10/30/22 exam is benign -F/up PCP  -We discussed ET may have been the cause of her  stroke, given the high risk of thrombosis with ET   3. Abnormal menses -She had normal periods until 08/2022 when her period lasted 1 month -She has fatigue and lightheadedness, possibly from blood loss or perimenopause  -She started herself on oral iron in 09/2022, tolerating well -Menstrual period in Feb was much lighter and lasted 3 days   4.         Age-appropriate health maintenance -I encouraged her to continue age appropriate health maintenance and cancer screenings, avoid smoking/alcohol, and exercise.  -She is overdue for first screening colonoscopy. Per PCP    PLAN: -Reviewed recent labs including MPN panel and abd [place] -JAK2 +, she has MPN/ET -Begin Hydrea 500 mg daily on Mon/Wed/Fri -Lab in 2 and 4 weeks after starting Hydrea -F/up 4 weeks after starting  -Repeat ABD [place] in 6 months  -Case reviewed with Dr. Mosetta Putt    The patient is now on hydroxyurea 3 times weekly.  Follow-up labs are ordered.  Patient also needs a Pap smear to be scheduled.  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  The patient is followed by behavioral health and they were notified of the discontinuance of bupropion.  She does have follow-up with them.  Mental health is stable at this time.   01/03/23 This patient is seen for primary care follow-up from a March visit.  She does have thrombocytosis and is under care by hematology on hydroxyurea  3 times weekly.  Today she brings a concern that she still having neck and frontal headaches, dry mouth, snoring, URI type symptoms, body aches, eye irritation and sneezing.  These have been going on for the past month.  She also has a blackened area at the tips of her nails.  This can be associated with hydroxyurea.  The headaches have been going on some time as have been the dry mouth snoring.  She drinks 1 glass of wine a day.  She denies chronic fatigue during the day. Blood pressure on arrival is excellent and she is on the hydrochlorothiazide 12 and half milligram daily   The patient is yet to follow-up with mental health after we stopped the Wellbutrin earlier this year due to side effects   Below is the assessment from last hematology visit   1.         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 WBC range 11.1 - 16.2, with predominant neutrophils, and platelet range 429 - 611  -10/30/22 iron panel was  normal. 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 She has no other abd imaging in our system but reportedly had US done in [place] a few months ago, the report is in [place] which she can't translate at this time. We plan to monitor with repeat ABD [place] in 6 months  -She began low dose hydrea 500 mg po daily on MWF, starting 11/13/22. Tolerating well except weight gain -Ms. Koo appears stable. Plt improved from 449 to 419 on hydrea so far, hgb also decreasing but no anemia yet. Will continue same dose -No signs of thrombosis. She would like to go to  [place] in [place]. We reviewed risk reducing behaviors. -Lab monthly, f/up in 3 months    2. Atypical chest pain, h/o CVA -She had a stroke in 2016 at age 48, details are unclear whether hemorrhagic vs ischemic. She was on aspirin for 6 months then stopped. No residual deficits -She reports intermittent left chest pain, non-exertional -She went to ED in 06/2022 for this pain, work up was unremarkable.  -10/30/22 exam is benign -F/up PCP  -We discussed ET may have been the cause of her stroke, given the high risk of thrombosis with ET -She continues to have intermittent chest pain, seen in ED 11/24/22, work up was unremarkable. I offered cardiology referral and she agreed.   3. Abnormal menses -She had normal periods until 08/2022 when her period lasted 1 month -She has fatigue and lightheadedness, possibly from blood loss or perimenopause  -She started herself on oral iron in 09/2022, tolerating well -Menstrual period in Feb was much lighter and lasted 3 days   4.         Age-appropriate health maintenance -I encouraged her to continue age appropriate health maintenance and cancer screenings, avoid smoking/alcohol, and exercise.  -She is overdue for  first screening colonoscopy. Per PCP   PLAN: -Labs reviewed -Continue low dose hydrea 500 mg on MWF (3x per week) -Discussed testing for family members and thrombosis precautions -Lab monthly and f/up in 3 months     03/05/23 Patient seen in return follow-up he needs a physical exam.  She states all of her multiple complaints from the last visit are now resolved.  She is no longer having polyuria is no longer having headaches.  She does have some knee and back pain.  We need to follow-up her blood counts cholesterol and vitamins  8/22 Patient seen in return follow-up she is doing well.  She has been off blood pressure medicines for several months blood pressure on arrival is good 119/78.  She is eating a healthier diet at this time.  She is moving and exercising more.  She remains on atorvastatin daily but no other prescription medicines except for as needed trazodone.  She does take vitamin D weekly.  She needs refills on multiple medications.  There are no other complaints. Most recent fall risk assessment:    03/05/2023   10:26 AM  Fall Risk   Falls in the past year? 0  Number falls in past yr: 0  Injury with Fall? 0  Risk for fall due to : No Fall Risks     Most recent depression screenings:    05/08/2023    8:40 AM 03/05/2023   10:26 AM  PHQ 2/9 Scores  PHQ - 2 Score 0 0  PHQ- 9 Score 6         Patient Care Team: [name], MD as PCP - General (Pulmonary Disease)   Outpatient Medications Prior to Visit  Medication Sig   ascorbic acid (VITAMIN C) 500 MG tablet Take 500 mg by mouth daily.   azelastine (ASTELIN) 0.1 % nasal spray Place 2 sprays into both nostrils 2 (two) times daily. Use in each nostril as directed   ferrous sulfate 324 MG TBEC Take 1 tablet (324 mg total) by mouth daily with breakfast.   hydroxyurea (HYDREA) 500 MG capsule Take 1 capsule (500 mg total) by mouth as directed 5 days per week M-F   Multiple Vitamins-Minerals (HAIR SKIN AND NAILS  FORMULA) TABS Take 1 tablet by mouth daily.   olopatadine (  PATANOL) 0.1 % ophthalmic solution Place 1 drop into both eyes 2 (two) times daily.   traZODone (DESYREL) 50 MG tablet Take 1-2 tablets (50-100 mg total) by mouth at bedtime as needed for sleep.   Turmeric (QC TUMERIC COMPLEX) 500 MG CAPS Take 2 tablets by mouth daily.   [DISCONTINUED] atorvastatin (LIPITOR) 20 MG tablet Take 1 tablet (20 mg total) by mouth daily.   [DISCONTINUED] omeprazole (PRILOSEC) 20 MG capsule Take 1 capsule (20 mg total) by mouth daily.   [DISCONTINUED] Vitamin D, Ergocalciferol, (DRISDOL) 1.25 MG (50000 UNIT) CAPS capsule Take 1 capsule (50,000 Units total) by mouth every 7 (seven) days.   No facility-administered medications prior to visit.    Review of Systems  Constitutional:  Negative for chills, diaphoresis, fever, malaise/fatigue and weight loss.  HENT:  Negative for congestion, hearing loss, nosebleeds, sore throat and tinnitus.   Eyes:  Negative for blurred vision, photophobia and redness.  Respiratory:  Negative for cough, hemoptysis, sputum production, shortness of breath, wheezing and stridor.   Cardiovascular:  Negative for chest pain, palpitations, orthopnea, claudication, leg swelling and PND.  Gastrointestinal:  Negative for abdominal pain, blood in stool, constipation, diarrhea, heartburn, nausea and vomiting.  Genitourinary:  Negative for dysuria, flank pain, frequency, hematuria and urgency.  Musculoskeletal:  Negative for back pain, falls, joint pain, myalgias and neck pain.  Skin:  Negative for itching and rash.  Neurological:  Negative for dizziness, tingling, tremors, sensory change, speech change, focal weakness, seizures, loss of consciousness, weakness and headaches.  Endo/Heme/Allergies:  Negative for environmental allergies and polydipsia. Does not bruise/bleed easily.  Psychiatric/Behavioral:  Negative for depression, memory loss, substance abuse and suicidal ideas. The patient is  not nervous/anxious and does not have insomnia.           Objective:     BP 119/78 (BP Location: Left Arm, Patient Position: Sitting, Cuff Size: Normal)   Pulse 69   Ht 5\' 10"  (1.778 m)   Wt 202 lb 12.8 oz (92 kg)   LMP 04/27/2023   SpO2 100%   BMI 29.10 kg/m    Physical Exam Vitals reviewed.  Constitutional:      Appearance: Normal appearance. She is well-developed. She is not diaphoretic.  HENT:     Head: Normocephalic and atraumatic.     Nose: No nasal deformity, septal deviation, mucosal edema or rhinorrhea.     Right Sinus: No maxillary sinus tenderness or frontal sinus tenderness.     Left Sinus: No maxillary sinus tenderness or frontal sinus tenderness.     Mouth/Throat:     Pharynx: No oropharyngeal exudate.  Eyes:     General: No scleral icterus.    Conjunctiva/sclera: Conjunctivae normal.     Pupils: Pupils are equal, round, and reactive to light.  Neck:     Thyroid: No thyromegaly.     Vascular: No carotid bruit or JVD.     Trachea: Trachea normal. No tracheal tenderness or tracheal deviation.  Cardiovascular:     Rate and Rhythm: Normal rate and regular rhythm.     Chest Wall: PMI is not displaced.     Pulses: Normal pulses. No decreased pulses.     Heart sounds: Normal heart sounds, S1 normal and S2 normal. Heart sounds not distant. No murmur heard.    No systolic murmur is present.     No diastolic murmur is present.     No friction rub. No gallop. No S3 or S4 sounds.  Pulmonary:  Effort: No tachypnea, accessory muscle usage or respiratory distress.     Breath sounds: No stridor. No decreased breath sounds, wheezing, rhonchi or rales.  Chest:     Chest wall: No tenderness.  Abdominal:     General: Bowel sounds are normal. There is no distension.     Palpations: Abdomen is soft. Abdomen is not rigid.     Tenderness: There is no abdominal tenderness. There is no guarding or rebound.  Musculoskeletal:        General: Normal range of motion.      Cervical back: Normal range of motion and neck supple. No edema, erythema or rigidity. No muscular tenderness. Normal range of motion.  Lymphadenopathy:     Head:     Right side of head: No submental or submandibular adenopathy.     Left side of head: No submental or submandibular adenopathy.     Cervical: No cervical adenopathy.  Skin:    General: Skin is warm and dry.     Coloration: Skin is not pale.     Findings: No rash.     Nails: There is no clubbing.  Neurological:     Mental Status: She is alert and oriented to person, place, and time.     Sensory: No sensory deficit.  Psychiatric:        Speech: Speech normal.        Behavior: Behavior normal.      No results found for any visits on 05/08/23.     Assessment & Plan:      Problem List Items Addressed This Visit       Digestive   GERD (gastroesophageal reflux disease)   Relevant Medications   omeprazole (PRILOSEC) 20 MG capsule     Other   Hyperlipidemia   Relevant Medications   atorvastatin (LIPITOR) 20 MG tablet    Return in about 6 months (around 11/08/2023) for primary care follow up.     Shan Levans, MD

## 2023-05-08 NOTE — Assessment & Plan Note (Signed)
Improved with hydroxyurea per hematology

## 2023-05-19 ENCOUNTER — Other Ambulatory Visit (HOSPITAL_COMMUNITY): Payer: Self-pay

## 2023-05-20 ENCOUNTER — Other Ambulatory Visit: Payer: Self-pay

## 2023-05-28 ENCOUNTER — Other Ambulatory Visit (HOSPITAL_COMMUNITY): Payer: Self-pay

## 2023-05-28 ENCOUNTER — Inpatient Hospital Stay: Payer: BC Managed Care – PPO | Attending: Nurse Practitioner

## 2023-05-28 DIAGNOSIS — D473 Essential (hemorrhagic) thrombocythemia: Secondary | ICD-10-CM | POA: Diagnosis present

## 2023-05-28 LAB — CMP (CANCER CENTER ONLY)
ALT: 36 U/L (ref 0–44)
AST: 30 U/L (ref 15–41)
Albumin: 4.5 g/dL (ref 3.5–5.0)
Alkaline Phosphatase: 55 U/L (ref 38–126)
Anion gap: 5 (ref 5–15)
BUN: 6 mg/dL (ref 6–20)
CO2: 30 mmol/L (ref 22–32)
Calcium: 9.9 mg/dL (ref 8.9–10.3)
Chloride: 105 mmol/L (ref 98–111)
Creatinine: 0.99 mg/dL (ref 0.44–1.00)
GFR, Estimated: >60 mL/min (ref >60)
Glucose, Bld: 94 mg/dL (ref 70–99)
Potassium: 4.1 mmol/L (ref 3.5–5.1)
Sodium: 140 mmol/L (ref 135–145)
Total Bilirubin: 0.5 mg/dL (ref 0.3–1.2)
Total Protein: 7.5 g/dL (ref 6.5–8.1)

## 2023-05-28 LAB — CBC WITH DIFFERENTIAL (CANCER CENTER ONLY)
Abs Immature Granulocytes: 0.01 10*3/uL (ref 0.00–0.07)
Basophils Absolute: 0.1 10*3/uL (ref 0.0–0.1)
Basophils Relative: 1 %
Eosinophils Absolute: 0.2 10*3/uL (ref 0.0–0.5)
Eosinophils Relative: 3 %
HCT: 42.6 % (ref 36.0–46.0)
Hemoglobin: 14.7 g/dL (ref 12.0–15.0)
Immature Granulocytes: 0 %
Lymphocytes Relative: 35 %
Lymphs Abs: 2.3 10*3/uL (ref 0.7–4.0)
MCH: 32.6 pg (ref 26.0–34.0)
MCHC: 34.5 g/dL (ref 30.0–36.0)
MCV: 94.5 fL (ref 80.0–100.0)
Monocytes Absolute: 0.5 10*3/uL (ref 0.1–1.0)
Monocytes Relative: 7 %
Neutro Abs: 3.7 10*3/uL (ref 1.7–7.7)
Neutrophils Relative %: 54 %
Platelet Count: 472 10*3/uL — ABNORMAL HIGH (ref 150–400)
RBC: 4.51 MIL/uL (ref 3.87–5.11)
RDW: 13.2 % (ref 11.5–15.5)
WBC Count: 6.8 10*3/uL (ref 4.0–10.5)
nRBC: 0 % (ref 0.0–0.2)

## 2023-05-30 ENCOUNTER — Other Ambulatory Visit: Payer: Self-pay

## 2023-05-30 ENCOUNTER — Other Ambulatory Visit (HOSPITAL_COMMUNITY): Payer: Self-pay

## 2023-05-30 ENCOUNTER — Telehealth: Payer: Self-pay

## 2023-05-30 MED ORDER — HYDROXYUREA 500 MG PO CAPS
500.0000 mg | ORAL_CAPSULE | Freq: Every day | ORAL | 2 refills | Status: DC
  Filled 2023-05-30 – 2023-06-22 (×2): qty 30, 30d supply, fill #0
  Filled 2023-07-16: qty 30, 30d supply, fill #1
  Filled 2023-07-25 – 2023-08-29 (×3): qty 30, 30d supply, fill #2

## 2023-05-30 NOTE — Telephone Encounter (Signed)
Spoke with pt via telephone to inform pt that Santiago Glad, NP has reviewed pt's recent labs.  Stated that pt's Plt were 472 which is over the goal plt count of <400.  Asked pt if she was taking her hydroxyurea.  Pt stated "Yes" she takes 500mg  M-F.  Stated that Clayborn Heron would now like the pt to take 500mg  daily (M-Sun).  Stated Clayborn Heron will continue to monitor the pt's labs and will make adjustments based off the lab results.  Pt verbalized understanding and had no further questions or concerns.

## 2023-06-22 ENCOUNTER — Other Ambulatory Visit (HOSPITAL_COMMUNITY): Payer: Self-pay

## 2023-06-23 ENCOUNTER — Other Ambulatory Visit (HOSPITAL_COMMUNITY): Payer: Self-pay

## 2023-06-26 ENCOUNTER — Other Ambulatory Visit: Payer: Self-pay

## 2023-07-16 ENCOUNTER — Other Ambulatory Visit: Payer: Self-pay

## 2023-07-16 ENCOUNTER — Other Ambulatory Visit (HOSPITAL_COMMUNITY): Payer: Self-pay

## 2023-07-19 ENCOUNTER — Other Ambulatory Visit: Payer: Self-pay | Admitting: Critical Care Medicine

## 2023-07-19 DIAGNOSIS — Z1231 Encounter for screening mammogram for malignant neoplasm of breast: Secondary | ICD-10-CM

## 2023-07-25 ENCOUNTER — Other Ambulatory Visit: Payer: Self-pay

## 2023-07-25 ENCOUNTER — Other Ambulatory Visit (HOSPITAL_COMMUNITY): Payer: Self-pay

## 2023-07-30 ENCOUNTER — Inpatient Hospital Stay: Payer: BC Managed Care – PPO | Attending: Nurse Practitioner

## 2023-07-30 DIAGNOSIS — D473 Essential (hemorrhagic) thrombocythemia: Secondary | ICD-10-CM | POA: Diagnosis present

## 2023-07-30 LAB — CBC WITH DIFFERENTIAL (CANCER CENTER ONLY)
Abs Immature Granulocytes: 0.03 10*3/uL (ref 0.00–0.07)
Basophils Absolute: 0.1 10*3/uL (ref 0.0–0.1)
Basophils Relative: 1 %
Eosinophils Absolute: 0.2 10*3/uL (ref 0.0–0.5)
Eosinophils Relative: 2 %
HCT: 39 % (ref 36.0–46.0)
Hemoglobin: 13.4 g/dL (ref 12.0–15.0)
Immature Granulocytes: 0 %
Lymphocytes Relative: 32 %
Lymphs Abs: 2.7 10*3/uL (ref 0.7–4.0)
MCH: 32.7 pg (ref 26.0–34.0)
MCHC: 34.4 g/dL (ref 30.0–36.0)
MCV: 95.1 fL (ref 80.0–100.0)
Monocytes Absolute: 0.6 10*3/uL (ref 0.1–1.0)
Monocytes Relative: 8 %
Neutro Abs: 4.9 10*3/uL (ref 1.7–7.7)
Neutrophils Relative %: 57 %
Platelet Count: 381 10*3/uL (ref 150–400)
RBC: 4.1 MIL/uL (ref 3.87–5.11)
RDW: 13.7 % (ref 11.5–15.5)
WBC Count: 8.5 10*3/uL (ref 4.0–10.5)
nRBC: 0 % (ref 0.0–0.2)

## 2023-07-30 LAB — CMP (CANCER CENTER ONLY)
ALT: 39 U/L (ref 0–44)
AST: 34 U/L (ref 15–41)
Albumin: 4.2 g/dL (ref 3.5–5.0)
Alkaline Phosphatase: 49 U/L (ref 38–126)
Anion gap: 4 — ABNORMAL LOW (ref 5–15)
BUN: 16 mg/dL (ref 6–20)
CO2: 28 mmol/L (ref 22–32)
Calcium: 9.4 mg/dL (ref 8.9–10.3)
Chloride: 104 mmol/L (ref 98–111)
Creatinine: 1 mg/dL (ref 0.44–1.00)
GFR, Estimated: >60 mL/min (ref >60)
Glucose, Bld: 130 mg/dL — ABNORMAL HIGH (ref 70–99)
Potassium: 4.2 mmol/L (ref 3.5–5.1)
Sodium: 136 mmol/L (ref 135–145)
Total Bilirubin: 0.4 mg/dL (ref <1.2)
Total Protein: 7 g/dL (ref 6.5–8.1)

## 2023-07-31 ENCOUNTER — Telehealth: Payer: Self-pay

## 2023-07-31 NOTE — Telephone Encounter (Signed)
-----   Message from Pollyann Samples sent at 07/30/2023  7:02 PM EST ----- Please let pt know PLT count is normal, please continue same dose hydrea (500 mg po once daily).   Thanks, Clayborn Heron NP

## 2023-07-31 NOTE — Telephone Encounter (Signed)
Pt advised of flab results and recommendations. She agrees with this plan of care

## 2023-08-06 ENCOUNTER — Other Ambulatory Visit (HOSPITAL_COMMUNITY)
Admission: RE | Admit: 2023-08-06 | Discharge: 2023-08-06 | Disposition: A | Discharge status: Home / Self Care | Payer: BC Managed Care – PPO | Source: Ambulatory Visit | Attending: Physician Assistant | Admitting: Physician Assistant

## 2023-08-06 ENCOUNTER — Ambulatory Visit: Payer: BC Managed Care – PPO | Attending: Physician Assistant | Admitting: Physician Assistant

## 2023-08-06 ENCOUNTER — Encounter: Payer: Self-pay | Admitting: Physician Assistant

## 2023-08-06 VITALS — BP 129/85 | HR 66 | Wt 208.6 lb

## 2023-08-06 DIAGNOSIS — N898 Other specified noninflammatory disorders of vagina: Secondary | ICD-10-CM | POA: Insufficient documentation

## 2023-08-06 DIAGNOSIS — L02224 Furuncle of groin: Secondary | ICD-10-CM | POA: Diagnosis not present

## 2023-08-06 NOTE — Progress Notes (Signed)
Patient ID: Alicia Robinson, female   DOB: 1976/05/22, 47 y.o.   MRN: [id number]   Alicia Robinson, is a 47 y.o. female  [id number]  [id number]  DOB - 05/31/1976  No chief complaint on file.      Subjective:   Alicia Robinson is a 47 y.o. female here today for hyperpigmented area in her L groin after having had a tender area/boil/inflammation about 2 weeks ago.  She has noticed a little more discharge than usual.  No STD risk factors.  No itching, burning, or dysuria.  The tender area in the groin has resolved.  Uses Nair in bikini area No problems updated.  ALLERGIES: Allergies  Allergen Reactions   Bupropion Other (See Comments)    POLYURIAN   Nsaids     Renal insufficiency- no per pt    PAST MEDICAL HISTORY: Past Medical History:  Diagnosis Date   Anxiety    Depression    Drug reaction 10/22/2022   Hyperlipidemia    Hypertension    Stroke [facility]) 2016   "mild"   Suicidal overdose (HCC)     MEDICATIONS AT HOME: Prior to Admission medications   Medication Sig Start Date End Date Taking? Authorizing Provider  ascorbic acid (VITAMIN C) 500 MG tablet Take 500 mg by mouth daily.   Yes [provider]  atorvastatin (LIPITOR) 20 MG tablet Take 1 tablet (20 mg total) by mouth daily. 05/08/23  Yes [name], MD  ferrous sulfate 324 MG TBEC Take 1 tablet (324 mg total) by mouth daily with breakfast. 03/26/23  Yes Pollyann Samples, NP  hydroxyurea (HYDREA) 500 MG capsule Take 1 capsule (500 mg total) by mouth daily. 05/30/23  Yes Pollyann Samples, NP  Multiple Vitamins-Minerals (HAIR SKIN AND NAILS FORMULA) TABS Take 1 tablet by mouth daily.   Yes [provider]  omeprazole (PRILOSEC) 20 MG capsule Take 1 capsule (20 mg total) by mouth daily. 05/08/23  Yes [name], MD  Vitamin D, Ergocalciferol, (DRISDOL) 1.25 MG (50000 UNIT) CAPS capsule Take 1 capsule (50,000 Units total) by mouth every 7 (seven) days. 05/08/23  Yes [name], MD  azelastine  (ASTELIN) 0.1 % nasal spray Place 2 sprays into both nostrils 2 (two) times daily. Use in each nostril as directed 01/02/23   [name], MD  olopatadine (PATANOL) 0.1 % ophthalmic solution Place 1 drop into both eyes 2 (two) times daily. 01/02/23   [name], MD  traZODone (DESYREL) 50 MG tablet Take 1-2 tablets (50-100 mg total) by mouth at bedtime as needed for sleep. 09/05/22 09/05/23  Bobbye Morton, MD  Turmeric (QC TUMERIC COMPLEX) 500 MG CAPS Take 2 tablets by mouth daily.    [provider]    ROS: Neg HEENT Neg resp Neg cardiac Neg GI Neg MS Neg psych Neg neuro  Objective:   Vitals:   08/06/23 1000  BP: 129/85  Pulse: 66  SpO2: 99%  Weight: 208 lb 9.6 oz (94.6 kg)   Exam General appearance : Awake, alert, not in any distress. Speech Clear. Not toxic looking HEENT: Atraumatic and Normocephalic Neck: Supple, no JVD. No cervical lymphadenopathy.  Chest: Good air entry bilaterally, CTAB.  No rales/rhonchi/wheezing CVS: S1 S2 regular, no murmurs.  L inguinal region at leg crease there is a 2cm area that is hyperpigmented that appears consistent with a healing boil or recently inflamed hair follicle.  There is NO LN in either inguinal region Extremities: B/L Lower Ext shows  no edema, both legs are warm to touch Neurology: Awake alert, and oriented X 3, CN II-XII intact, Non focal Skin: No Rash  Data Review Lab Results  Component Value Date   HGBA1C 5.4 10/22/2022   HGBA1C 5.7 (H) 03/10/2020   HGBA1C 5.4 11/19/2018    Assessment & Plan   1. Vaginal discharge - Cervicovaginal ancillary only  2. Boil of groin Resolved and has thin hyperpigmented skin consistent with recent boil or inflamed follicle.      Return for appt in January with Dr Alvis Lemmings as scheduled.  The patient was given clear instructions to go to ER or return to medical center if symptoms don't improve, worsen or new problems develop. The patient verbalized understanding. The  patient was told to call to get lab results if they haven't heard anything in the next week.      Georgian Co, PA-C [facility] [place], [place] [phone]   08/06/2023, 10:13 AM

## 2023-08-07 LAB — CERVICOVAGINAL ANCILLARY ONLY
Bacterial Vaginitis (gardnerella): NEGATIVE
Candida Glabrata: NEGATIVE
Candida Vaginitis: NEGATIVE
Chlamydia: NEGATIVE
Comment: NEGATIVE
Comment: NEGATIVE
Comment: NEGATIVE
Comment: NEGATIVE
Comment: NEGATIVE
Comment: NORMAL
Neisseria Gonorrhea: NEGATIVE
Trichomonas: NEGATIVE

## 2023-08-08 ENCOUNTER — Telehealth: Payer: Self-pay

## 2023-08-08 NOTE — Telephone Encounter (Signed)
Patient viewed results and Doctor comment through [facility]

## 2023-08-08 NOTE — Telephone Encounter (Signed)
-----   Message from Arroyo sent at 08/07/2023  1:43 PM EST ----- Your vaginal swab was normal.  No imbalance of bacteria or yeast.  No STD either(no chlamydia, gonorrhea, trichomonas).  Thanks, Georgian Co, PA-C

## 2023-08-12 ENCOUNTER — Ambulatory Visit
Admission: RE | Admit: 2023-08-12 | Discharge: 2023-08-12 | Disposition: A | Discharge status: Home / Self Care | Payer: BC Managed Care – PPO | Source: Ambulatory Visit

## 2023-08-12 DIAGNOSIS — Z1231 Encounter for screening mammogram for malignant neoplasm of breast: Secondary | ICD-10-CM

## 2023-08-13 ENCOUNTER — Encounter: Payer: Self-pay | Admitting: Pharmacist

## 2023-08-13 ENCOUNTER — Other Ambulatory Visit: Payer: Self-pay

## 2023-08-13 ENCOUNTER — Other Ambulatory Visit (HOSPITAL_COMMUNITY): Payer: Self-pay

## 2023-08-17 ENCOUNTER — Other Ambulatory Visit (HOSPITAL_COMMUNITY): Payer: Self-pay

## 2023-08-18 ENCOUNTER — Other Ambulatory Visit: Payer: Self-pay

## 2023-08-18 ENCOUNTER — Telehealth: Payer: Self-pay | Admitting: Physician Assistant

## 2023-08-18 ENCOUNTER — Other Ambulatory Visit: Payer: Self-pay | Admitting: Physician Assistant

## 2023-08-18 DIAGNOSIS — R928 Other abnormal and inconclusive findings on diagnostic imaging of breast: Secondary | ICD-10-CM

## 2023-08-19 ENCOUNTER — Telehealth: Payer: Self-pay

## 2023-08-19 NOTE — Telephone Encounter (Signed)
-----   Message from Roney Jaffe sent at 08/13/2023  2:09 PM EST ----- Please call patient and let her know that her mammogram did show the need for further evaluation in the right breast.  The breast center will reach out to her to schedule a diagnostic mammogram and possibly an ultrasound of the right breast.

## 2023-08-19 NOTE — Telephone Encounter (Signed)
Patient viewed results and Doctor comment through [facility]

## 2023-08-29 ENCOUNTER — Other Ambulatory Visit: Payer: Self-pay

## 2023-08-30 ENCOUNTER — Ambulatory Visit
Admission: RE | Admit: 2023-08-30 | Discharge: 2023-08-30 | Disposition: A | Discharge status: Home / Self Care | Payer: BC Managed Care – PPO | Source: Ambulatory Visit | Attending: Physician Assistant

## 2023-08-30 ENCOUNTER — Other Ambulatory Visit: Payer: Self-pay | Admitting: Physician Assistant

## 2023-08-30 DIAGNOSIS — N6489 Other specified disorders of breast: Secondary | ICD-10-CM

## 2023-08-30 DIAGNOSIS — R928 Other abnormal and inconclusive findings on diagnostic imaging of breast: Secondary | ICD-10-CM

## 2023-09-16 ENCOUNTER — Other Ambulatory Visit (HOSPITAL_BASED_OUTPATIENT_CLINIC_OR_DEPARTMENT_OTHER): Payer: Self-pay

## 2023-09-16 ENCOUNTER — Other Ambulatory Visit (HOSPITAL_COMMUNITY): Payer: Self-pay

## 2023-09-16 ENCOUNTER — Other Ambulatory Visit: Payer: Self-pay

## 2023-09-16 ENCOUNTER — Other Ambulatory Visit: Payer: Self-pay | Admitting: Nurse Practitioner

## 2023-09-16 MED ORDER — HYDROXYUREA 500 MG PO CAPS
500.0000 mg | ORAL_CAPSULE | Freq: Every day | ORAL | 2 refills | Status: DC
  Filled 2023-09-16 – 2023-09-25 (×3): qty 30, 30d supply, fill #0
  Filled 2023-10-23: qty 30, 30d supply, fill #1
  Filled 2023-12-19: qty 30, 30d supply, fill #2

## 2023-09-18 ENCOUNTER — Other Ambulatory Visit (HOSPITAL_BASED_OUTPATIENT_CLINIC_OR_DEPARTMENT_OTHER): Payer: Self-pay

## 2023-09-19 ENCOUNTER — Other Ambulatory Visit (HOSPITAL_BASED_OUTPATIENT_CLINIC_OR_DEPARTMENT_OTHER): Payer: Self-pay

## 2023-09-21 ENCOUNTER — Other Ambulatory Visit (HOSPITAL_COMMUNITY): Payer: Self-pay

## 2023-09-23 ENCOUNTER — Ambulatory Visit: Payer: Self-pay | Admitting: Critical Care Medicine

## 2023-09-23 ENCOUNTER — Ambulatory Visit: Payer: BC Managed Care – PPO | Attending: Critical Care Medicine | Admitting: Family Medicine

## 2023-09-23 ENCOUNTER — Other Ambulatory Visit: Payer: Self-pay

## 2023-09-23 ENCOUNTER — Other Ambulatory Visit (HOSPITAL_COMMUNITY): Payer: Self-pay

## 2023-09-23 VITALS — BP 129/84 | HR 64 | Ht 70.0 in | Wt 214.2 lb

## 2023-09-23 DIAGNOSIS — N898 Other specified noninflammatory disorders of vagina: Secondary | ICD-10-CM

## 2023-09-23 MED ORDER — CLOTRIMAZOLE 1 % VA CREA
1.0000 | TOPICAL_CREAM | Freq: Every day | VAGINAL | 0 refills | Status: DC
  Filled 2023-09-23: qty 45, 7d supply, fill #0

## 2023-09-23 NOTE — Progress Notes (Signed)
 Subjective:  Patient ID: Alicia Robinson, female    DOB: 1976-02-10  Age: 48 y.o. MRN: [id number]  CC: Medical Management of Chronic Issues (Irritation in vaginal area)   HPI Alicia Robinson is a 48 y.o. year old female with a history of hypertension, thrombocythemia, hyperlipidemia, GERD She presents for an acute visit.  Interval History: Discussed the use of AI scribe software for clinical note transcription with the patient, who gave verbal consent to proceed.  She presents with vaginal irritation. She describes the discomfort as pain on the outside of the vagina, specifically on the right side. The pain is likened to a spot that hurts when touched, similar to a pimple but more painful. She denies any associated discharge or itching. She had a similar episode a long time ago that resulted in a growth. She also had a recent episode of vaginal discharge two months ago, but all tests for bacterial infection and STDs were negative. She has not been sexually active for over three years.        Past Medical History:  Diagnosis Date   Anxiety    Depression    Drug reaction 10/22/2022   Hyperlipidemia    Hypertension    Stroke (HCC) 2016   mild   Suicidal overdose (HCC)     Past Surgical History:  Procedure Laterality Date   CHOLECYSTECTOMY     SIGMOIDOSCOPY     pt is unsure about this   TUBAL LIGATION     UPPER GASTROINTESTINAL ENDOSCOPY      Family History  Problem Relation Age of Onset   Hypertension Mother    Diabetes Mother    Hypertension Father    Diabetes Father    Diabetes Maternal Grandmother    Hypertension Maternal Grandmother    Colon cancer Neg Hx    Esophageal cancer Neg Hx    Rectal cancer Neg Hx    Stomach cancer Neg Hx     Social History   Socioeconomic History   Marital status: Single    Spouse name: Not on file   Number of children: 3   Years of education: Not on file   Highest education level: Associate degree: occupational, scientist, product/process development, or  vocational program  Occupational History   Not on file  Tobacco Use   Smoking status: Never   Smokeless tobacco: Never  Vaping Use   Vaping status: Never Used  Substance and Sexual Activity   Alcohol use: Yes    Comment: weekends, socially   Drug use: Not Currently    Types: Marijuana   Sexual activity: Not Currently  Other Topics Concern   Not on file  Social History Narrative   Not on file   Social Drivers of Health   Financial Resource Strain: Low Risk  (09/23/2023)   Overall Financial Resource Strain (CARDIA)    Difficulty of Paying Living Expenses: Not very hard  Food Insecurity: No Food Insecurity (09/23/2023)   Hunger Vital Sign    Worried About Running Out of Food in the Last Year: Never true    Ran Out of Food in the Last Year: Never true  Transportation Needs: No Transportation Needs (09/23/2023)   PRAPARE - Administrator, Civil Service (Medical): No    Lack of Transportation (Non-Medical): No  Physical Activity: Sufficiently Active (09/23/2023)   Exercise Vital Sign    Days of Exercise per Week: 5 days    Minutes of Exercise per Session: 90 min  Stress: No Stress  Concern Present (09/23/2023)   [employer] of Occupational Health - Occupational Stress Questionnaire    Feeling of Stress : Not at all  Social Connections: Moderately Integrated (09/23/2023)   Social Connection and Isolation Panel [NHANES]    Frequency of Communication with Friends and Family: Three times a week    Frequency of Social Gatherings with Friends and Family: Once a week    Attends Religious Services: More than 4 times per year    Active Member of [employer] or Organizations: Yes    Attends Engineer, Structural: More than 4 times per year    Marital Status: Never married    Allergies  Allergen Reactions   Bupropion  Other (See Comments)    POLYURIAN   Nsaids     Renal insufficiency- no per pt    Outpatient Medications Prior to Visit  Medication Sig Dispense Refill    ascorbic acid (VITAMIN C) 500 MG tablet Take 500 mg by mouth daily.     atorvastatin  (LIPITOR) 20 MG tablet Take 1 tablet (20 mg total) by mouth daily. 90 tablet 1   ferrous sulfate  324 MG TBEC Take 1 tablet (324 mg total) by mouth daily with breakfast. 30 tablet 3   hydroxyurea  (HYDREA ) 500 MG capsule Take 1 capsule (500 mg total) by mouth daily. 30 capsule 2   Multiple Vitamins-Minerals (HAIR SKIN AND NAILS FORMULA) TABS Take 1 tablet by mouth daily.     omeprazole  (PRILOSEC) 20 MG capsule Take 1 capsule (20 mg total) by mouth daily. 90 capsule 2   Vitamin D , Ergocalciferol , (DRISDOL ) 1.25 MG (50000 UNIT) CAPS capsule Take 1 capsule (50,000 Units total) by mouth every 7 (seven) days. 14 capsule 3   azelastine  (ASTELIN ) 0.1 % nasal spray Place 2 sprays into both nostrils 2 (two) times daily. Use in each nostril as directed 30 mL 12   olopatadine  (PATANOL) 0.1 % ophthalmic solution Place 1 drop into both eyes 2 (two) times daily. 5 mL 12   traZODone  (DESYREL ) 50 MG tablet Take 1-2 tablets (50-100 mg total) by mouth at bedtime as needed for sleep. 60 tablet 3   Turmeric (QC TUMERIC COMPLEX) 500 MG CAPS Take 2 tablets by mouth daily.     No facility-administered medications prior to visit.     ROS Review of Systems  Constitutional:  Negative for activity change and appetite change.  HENT:  Negative for sinus pressure and sore throat.   Respiratory:  Negative for chest tightness, shortness of breath and wheezing.   Cardiovascular:  Negative for chest pain and palpitations.  Gastrointestinal:  Negative for abdominal distention, abdominal pain and constipation.  Genitourinary: Negative.   Musculoskeletal: Negative.   Psychiatric/Behavioral:  Negative for behavioral problems and dysphoric mood.     Objective:  BP 129/84   Pulse 64   Ht 5' 10 (1.778 m)   Wt 214 lb 3.2 oz (97.2 kg)   SpO2 98%   BMI 30.73 kg/m      09/23/2023    1:38 PM 08/06/2023   10:00 AM 05/08/2023    8:40 AM   BP/Weight  Systolic BP 129 129 119  Diastolic BP 84 85 78  Wt. (Lbs) 214.2 208.6 202.8  BMI 30.73 kg/m2 29.93 kg/m2 29.1 kg/m2      Physical Exam Constitutional:      Appearance: She is well-developed.  Cardiovascular:     Rate and Rhythm: Normal rate.     Heart sounds: Normal heart sounds. No murmur heard. Pulmonary:  Effort: Pulmonary effort is normal.     Breath sounds: Normal breath sounds. No wheezing or rales.  Chest:     Chest wall: No tenderness.  Abdominal:     General: Bowel sounds are normal. There is no distension.     Palpations: Abdomen is soft. There is no mass.     Tenderness: There is no abdominal tenderness.  Genitourinary:    Comments: Normal appearance of perineum and vulva with no evidence of cyst or furuncle.  Scratch mark noted on right side of vestibule Musculoskeletal:        General: Normal range of motion.     Right lower leg: No edema.     Left lower leg: No edema.  Neurological:     Mental Status: She is alert and oriented to person, place, and time.  Psychiatric:        Mood and Affect: Mood normal.        Latest Ref Rng & Units 07/30/2023    1:24 PM 05/28/2023    1:30 PM 03/26/2023    8:37 AM  CMP  Glucose 70 - 99 mg/dL 869  94  84   BUN 6 - 20 mg/dL 16  6  16    Creatinine 0.44 - 1.00 mg/dL 8.99  9.00  9.04   Sodium 135 - 145 mmol/L 136  140  140   Potassium 3.5 - 5.1 mmol/L 4.2  4.1  4.5   Chloride 98 - 111 mmol/L 104  105  108   CO2 22 - 32 mmol/L 28  30  28    Calcium  8.9 - 10.3 mg/dL 9.4  9.9  9.2   Total Protein 6.5 - 8.1 g/dL 7.0  7.5  6.8   Total Bilirubin <1.2 mg/dL 0.4  0.5  0.5   Alkaline Phos 38 - 126 U/L 49  55  46   AST 15 - 41 U/L 34  30  27   ALT 0 - 44 U/L 39  36  37     Lipid Panel     Component Value Date/Time   CHOL 159 03/11/2023 1108   TRIG 51 03/11/2023 1108   HDL 69 03/11/2023 1108   CHOLHDL 2.3 03/11/2023 1108   CHOLHDL 3.3 11/19/2018 0633   VLDL 16 11/19/2018 0633   LDLCALC 79 03/11/2023 1108     CBC    Component Value Date/Time   WBC 8.5 07/30/2023 1324   WBC 11.0 (H) 11/24/2022 1627   RBC 4.10 07/30/2023 1324   HGB 13.4 07/30/2023 1324   HGB 13.1 03/11/2023 1108   HCT 39.0 07/30/2023 1324   HCT 39.4 03/11/2023 1108   PLT 381 07/30/2023 1324   PLT 461 (H) 03/11/2023 1108   MCV 95.1 07/30/2023 1324   MCV 95 03/11/2023 1108   MCH 32.7 07/30/2023 1324   MCHC 34.4 07/30/2023 1324   RDW 13.7 07/30/2023 1324   RDW 13.8 03/11/2023 1108   LYMPHSABS 2.7 07/30/2023 1324   LYMPHSABS 2.5 03/11/2023 1108   MONOABS 0.6 07/30/2023 1324   EOSABS 0.2 07/30/2023 1324   EOSABS 0.3 03/11/2023 1108   BASOSABS 0.1 07/30/2023 1324   BASOSABS 0.1 03/11/2023 1108    Lab Results  Component Value Date   HGBA1C 5.4 10/22/2022    Assessment & Plan:      Vaginal Irritation Localized pain on the right side of the vulva, no discharge or itching. Physical exam revealed a small scratch-like mark, no evidence of a cyst or boil. -She declines  vaginal swab - Prescribe antifungal cream to be applied to the affected area.    Meds ordered this encounter  Medications   clotrimazole  (GYNE-LOTRIMIN ) 1 % vaginal cream    Sig: Place 1 Applicatorful vaginally at bedtime.    Dispense:  45 g    Refill:  0    Follow-up: Return in about 3 months (around 12/22/2023) for Chronic medical conditions.       Corrina Sabin, MD, FAAFP. [facility] [place], [place] [phone]   09/23/2023, 2:33 PM

## 2023-09-23 NOTE — Patient Instructions (Signed)

## 2023-09-23 NOTE — Assessment & Plan Note (Signed)
 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 WBC range 11.1 - 16.2, with predominant neutrophils, and platelet range 429 - 611  -10/30/22 iron panel was normal. She is on oral iron for perimenopausal bleeding which is heavy at times -Further work up showed JAK2 Val617Phe, which confirms essential thrombocythemia/MPN -ABD US  showed small lesion on the spleen which is favored benign hemangioma. -she is on hydrea  now, started in 10/2022, due to her previous history of stroke in 2016

## 2023-09-24 ENCOUNTER — Inpatient Hospital Stay: Payer: BC Managed Care – PPO | Attending: Nurse Practitioner

## 2023-09-24 ENCOUNTER — Inpatient Hospital Stay (HOSPITAL_BASED_OUTPATIENT_CLINIC_OR_DEPARTMENT_OTHER): Payer: BC Managed Care – PPO | Admitting: Hematology

## 2023-09-24 VITALS — BP 130/90 | HR 83 | Temp 98.2°F | Resp 18 | Wt 210.0 lb

## 2023-09-24 DIAGNOSIS — D473 Essential (hemorrhagic) thrombocythemia: Secondary | ICD-10-CM | POA: Insufficient documentation

## 2023-09-24 DIAGNOSIS — Z9049 Acquired absence of other specified parts of digestive tract: Secondary | ICD-10-CM | POA: Diagnosis not present

## 2023-09-24 DIAGNOSIS — I10 Essential (primary) hypertension: Secondary | ICD-10-CM | POA: Diagnosis not present

## 2023-09-24 DIAGNOSIS — Z7964 Long term (current) use of myelosuppressive agent: Secondary | ICD-10-CM | POA: Diagnosis not present

## 2023-09-24 DIAGNOSIS — Z8673 Personal history of transient ischemic attack (TIA), and cerebral infarction without residual deficits: Secondary | ICD-10-CM | POA: Diagnosis not present

## 2023-09-24 DIAGNOSIS — Z79899 Other long term (current) drug therapy: Secondary | ICD-10-CM | POA: Diagnosis not present

## 2023-09-24 LAB — CBC WITH DIFFERENTIAL (CANCER CENTER ONLY)
Abs Immature Granulocytes: 0.03 10*3/uL (ref 0.00–0.07)
Basophils Absolute: 0.1 10*3/uL (ref 0.0–0.1)
Basophils Relative: 1 %
Eosinophils Absolute: 0.1 10*3/uL (ref 0.0–0.5)
Eosinophils Relative: 1 %
HCT: 39.6 % (ref 36.0–46.0)
Hemoglobin: 13.7 g/dL (ref 12.0–15.0)
Immature Granulocytes: 0 %
Lymphocytes Relative: 24 %
Lymphs Abs: 2.5 10*3/uL (ref 0.7–4.0)
MCH: 33.2 pg (ref 26.0–34.0)
MCHC: 34.6 g/dL (ref 30.0–36.0)
MCV: 95.9 fL (ref 80.0–100.0)
Monocytes Absolute: 0.7 10*3/uL (ref 0.1–1.0)
Monocytes Relative: 7 %
Neutro Abs: 7.1 10*3/uL (ref 1.7–7.7)
Neutrophils Relative %: 67 %
Platelet Count: 412 10*3/uL — ABNORMAL HIGH (ref 150–400)
RBC: 4.13 MIL/uL (ref 3.87–5.11)
RDW: 13 % (ref 11.5–15.5)
WBC Count: 10.4 10*3/uL (ref 4.0–10.5)
nRBC: 0 % (ref 0.0–0.2)

## 2023-09-24 LAB — CMP (CANCER CENTER ONLY)
ALT: 45 U/L — ABNORMAL HIGH (ref 0–44)
AST: 33 U/L (ref 15–41)
Albumin: 4.4 g/dL (ref 3.5–5.0)
Alkaline Phosphatase: 54 U/L (ref 38–126)
Anion gap: 7 (ref 5–15)
BUN: 17 mg/dL (ref 6–20)
CO2: 28 mmol/L (ref 22–32)
Calcium: 9.8 mg/dL (ref 8.9–10.3)
Chloride: 104 mmol/L (ref 98–111)
Creatinine: 0.98 mg/dL (ref 0.44–1.00)
GFR, Estimated: >60 mL/min (ref >60)
Glucose, Bld: 107 mg/dL — ABNORMAL HIGH (ref 70–99)
Potassium: 4.1 mmol/L (ref 3.5–5.1)
Sodium: 139 mmol/L (ref 135–145)
Total Bilirubin: 0.6 mg/dL (ref 0.0–1.2)
Total Protein: 7.2 g/dL (ref 6.5–8.1)

## 2023-09-24 NOTE — Progress Notes (Signed)
 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 WBC range 11.1 - 16.2, with predominant neutrophils, and platelet range 429 - 611  -10/30/22 iron panel was normal. She is on oral iron for perimenopausal bleeding which is heavy at times -Further work up showed JAK2 Val617Phe, which confirms essential thrombocythemia/MPN -ABD US  showed small lesion on the spleen which is favored benign hemangioma. -she is on hydrea  now, started in 10/2022, due to her previous history of stroke in 2016    Assessment and Plan    Essential Thrombocythemia (ET)   ET is a chronic myeloproliferative disorder characterized by elevated platelet count. Platelet count is well-controlled with Hydrea  (hydroxyurea ) 500 mg, with recent counts around 412, consistently below the target of 500. ET increases the risk of thrombotic events, including DVT, PE, stroke, and MI. She is allergic to aspirin , commonly used in ET management. She had a stroke in 2016, possibly related to ET, stress, and previous hypertension.   - Continue Hydrea  500 mg daily - Monitor platelet count every three months   - Schedule follow-up appointment in six months    Stroke   Experienced a stroke in 2016, potentially related to ET, stress, and previous hypertension. Not currently on antihypertensive medications and reports feeling well.   - Monitor for new neurological symptoms   - Reassess stroke risk factors periodically    General Health  Maintenance   Not diabetic and does not currently have hypertension. Taking Hydrea  for ET and vitamins.   - Continue current medications   - Follow up with primary care for routine health maintenance    Plan -Lab reviewed, platelet is well-controlled -Continue Hydrea  500 mg daily - Schedule follow-up appointment with Lacie in six months   - Perform lab tests every three months to monitor platelet count.         SUMMARY OF ONCOLOGIC HISTORY: Oncology History   No history exists.     Discussed the use of AI scribe software for clinical note transcription with the patient, who gave verbal consent to proceed.  History of Present Illness   The patient, a 48 year old with a history of Essential Thrombocytopenia (ET), presents for a routine follow-up. She is currently on Hydroxyurea  500mg  with no reported side effects. The patient's platelet count is well controlled, fluctuating between 381 and 412, never exceeding 500.  In 2016, the patient experienced a stroke, which was attributed to stress at the time. She was living in [place]  and reported a lifestyle that included frequent partying. The patient was unaware of her platelet count at the time of the stroke. She was diagnosed with ET last year. The patient had high blood pressure in 2016, but has since been taken off the medication. She is not diabetic and currently takes Hydroxyurea  for high cholesterol and vitamins.  The patient reports feeling well overall. She is allergic to aspirin  and was previously taken off it by her doctor. She lives in [place] and is scheduled for follow-up labs every three  months with a six-month follow-up appointment.         All other systems were reviewed with the patient and are negative.  MEDICAL HISTORY:  Past Medical History:  Diagnosis Date   Anxiety    Depression    Drug reaction 10/22/2022   Hyperlipidemia    Hypertension    Stroke (HCC) 2016   mild   Suicidal overdose (HCC)      SURGICAL HISTORY: Past Surgical History:  Procedure Laterality Date   CHOLECYSTECTOMY     SIGMOIDOSCOPY     pt is unsure about this   TUBAL LIGATION     UPPER GASTROINTESTINAL ENDOSCOPY      I have reviewed the social history and family history with the patient and they are unchanged from previous note.  ALLERGIES:  is allergic to bupropion  and nsaids.  MEDICATIONS:  Current Outpatient Medications  Medication Sig Dispense Refill   ascorbic acid (VITAMIN C) 500 MG tablet Take 500 mg by mouth daily.     atorvastatin  (LIPITOR) 20 MG tablet Take 1 tablet (20 mg total) by mouth daily. 90 tablet 1   azelastine  (ASTELIN ) 0.1 % nasal spray Place 2 sprays into both nostrils 2 (two) times daily. Use in each nostril as directed 30 mL 12   clotrimazole  (GYNE-LOTRIMIN ) 1 % vaginal cream Place 1 Applicatorful vaginally at bedtime. 45 g 0   ferrous sulfate  324 MG TBEC Take 1 tablet (324 mg total) by mouth daily with breakfast. 30 tablet 3   hydroxyurea  (HYDREA ) 500 MG capsule Take 1 capsule (500 mg total) by mouth daily. 30 capsule 2   Multiple Vitamins-Minerals (HAIR SKIN AND NAILS FORMULA) TABS Take 1 tablet by mouth daily.     olopatadine  (PATANOL) 0.1 % ophthalmic solution Place 1 drop into both eyes 2 (two) times daily. 5 mL 12   omeprazole  (PRILOSEC) 20 MG capsule Take 1 capsule (20 mg total) by mouth daily. 90 capsule 2   traZODone  (DESYREL ) 50 MG tablet Take 1-2 tablets (50-100 mg total) by mouth at bedtime as needed for sleep. 60 tablet 3   Turmeric (QC TUMERIC COMPLEX) 500 MG CAPS Take 2 tablets by mouth daily.     Vitamin D , Ergocalciferol , (DRISDOL ) 1.25 MG (50000 UNIT) CAPS capsule Take 1 capsule (50,000 Units total) by mouth every 7 (seven) days. 14 capsule 3   No current facility-administered medications for this visit.    PHYSICAL EXAMINATION: ECOG PERFORMANCE STATUS: 0 - Asymptomatic  Vitals:   09/24/23 1302  BP: (!) 130/90  Pulse: 83  Resp: 18  Temp: 98.2 F (36.8  C)  SpO2: 99%   Wt Readings from Last 3 Encounters:  09/24/23 210 lb (95.3 kg)  09/23/23 214 lb 3.2 oz (97.2 kg)  08/06/23 208 lb 9.6 oz (94.6 kg)     GENERAL:alert, no distress and comfortable SKIN: skin color, texture, turgor are normal, no rashes or significant lesions EYES: normal, Conjunctiva are pink and non-injected, sclera clear NECK: supple, thyroid normal size, non-tender, without nodularity LYMPH:  no palpable lymphadenopathy in the cervical, axillary  LUNGS: clear to auscultation and percussion with normal breathing effort HEART: regular rate & rhythm and no murmurs and no lower extremity edema ABDOMEN:abdomen soft, non-tender and normal bowel sounds Musculoskeletal:no cyanosis of digits and no clubbing  NEURO: alert & oriented x 3 with fluent speech, no focal motor/sensory deficits    LABORATORY DATA:  I have reviewed the data as listed    Latest Ref Rng & Units 09/24/2023  12:53 PM 07/30/2023    1:24 PM 05/28/2023    1:30 PM  CBC  WBC 4.0 - 10.5 K/uL 10.4  8.5  6.8   Hemoglobin 12.0 - 15.0 g/dL 86.2  86.5  85.2   Hematocrit 36.0 - 46.0 % 39.6  39.0  42.6   Platelets 150 - 400 K/uL 412  381  472         Latest Ref Rng & Units 09/24/2023   12:53 PM 07/30/2023    1:24 PM 05/28/2023    1:30 PM  CMP  Glucose 70 - 99 mg/dL 892  869  94   BUN 6 - 20 mg/dL 17  16  6    Creatinine 0.44 - 1.00 mg/dL 9.01  8.99  9.00   Sodium 135 - 145 mmol/L 139  136  140   Potassium 3.5 - 5.1 mmol/L 4.1  4.2  4.1   Chloride 98 - 111 mmol/L 104  104  105   CO2 22 - 32 mmol/L 28  28  30    Calcium  8.9 - 10.3 mg/dL 9.8  9.4  9.9   Total Protein 6.5 - 8.1 g/dL 7.2  7.0  7.5   Total Bilirubin 0.0 - 1.2 mg/dL 0.6  0.4  0.5   Alkaline Phos 38 - 126 U/L 54  49  55   AST 15 - 41 U/L 33  34  30   ALT 0 - 44 U/L 45  39  36       RADIOGRAPHIC STUDIES: I have personally reviewed the radiological images as listed and agreed with the findings in the report. No results found.    Orders  Placed This Encounter  Procedures   CMP (Cancer Center only)    Standing Status:   Standing    Number of Occurrences:   100    Expiration Date:   09/23/2024   All questions were answered. The patient knows to call the clinic with any problems, questions or concerns. No barriers to learning was detected. The total time spent in the appointment was 15 minutes.     Onita Mattock, MD 09/24/2023

## 2023-09-25 ENCOUNTER — Other Ambulatory Visit: Payer: Self-pay

## 2023-09-25 ENCOUNTER — Other Ambulatory Visit (HOSPITAL_BASED_OUTPATIENT_CLINIC_OR_DEPARTMENT_OTHER): Payer: Self-pay

## 2023-09-26 ENCOUNTER — Other Ambulatory Visit (HOSPITAL_COMMUNITY): Payer: Self-pay

## 2023-10-03 ENCOUNTER — Observation Stay (HOSPITAL_COMMUNITY): Payer: BC Managed Care – PPO

## 2023-10-03 ENCOUNTER — Emergency Department (HOSPITAL_COMMUNITY): Payer: BC Managed Care – PPO

## 2023-10-03 ENCOUNTER — Encounter (HOSPITAL_COMMUNITY): Payer: Self-pay | Admitting: Emergency Medicine

## 2023-10-03 ENCOUNTER — Observation Stay (HOSPITAL_COMMUNITY)
Admission: EM | Admit: 2023-10-03 | Discharge: 2023-10-05 | Disposition: A | Discharge status: Home / Self Care | Payer: BC Managed Care – PPO | Attending: Internal Medicine | Admitting: Internal Medicine

## 2023-10-03 ENCOUNTER — Other Ambulatory Visit: Payer: Self-pay

## 2023-10-03 DIAGNOSIS — I63511 Cerebral infarction due to unspecified occlusion or stenosis of right middle cerebral artery: Secondary | ICD-10-CM | POA: Diagnosis not present

## 2023-10-03 DIAGNOSIS — Z7982 Long term (current) use of aspirin: Secondary | ICD-10-CM | POA: Diagnosis not present

## 2023-10-03 DIAGNOSIS — I10 Essential (primary) hypertension: Secondary | ICD-10-CM | POA: Insufficient documentation

## 2023-10-03 DIAGNOSIS — R21 Rash and other nonspecific skin eruption: Secondary | ICD-10-CM | POA: Diagnosis not present

## 2023-10-03 DIAGNOSIS — Z79899 Other long term (current) drug therapy: Secondary | ICD-10-CM | POA: Insufficient documentation

## 2023-10-03 DIAGNOSIS — R4182 Altered mental status, unspecified: Secondary | ICD-10-CM | POA: Diagnosis present

## 2023-10-03 DIAGNOSIS — I6389 Other cerebral infarction: Secondary | ICD-10-CM | POA: Diagnosis not present

## 2023-10-03 DIAGNOSIS — E785 Hyperlipidemia, unspecified: Secondary | ICD-10-CM | POA: Diagnosis not present

## 2023-10-03 DIAGNOSIS — D473 Essential (hemorrhagic) thrombocythemia: Secondary | ICD-10-CM | POA: Insufficient documentation

## 2023-10-03 DIAGNOSIS — I639 Cerebral infarction, unspecified: Principal | ICD-10-CM | POA: Diagnosis present

## 2023-10-03 LAB — DIFFERENTIAL
Abs Immature Granulocytes: 0.02 10*3/uL (ref 0.00–0.07)
Basophils Absolute: 0.1 10*3/uL (ref 0.0–0.1)
Basophils Relative: 1 %
Eosinophils Absolute: 0.1 10*3/uL (ref 0.0–0.5)
Eosinophils Relative: 1 %
Immature Granulocytes: 0 %
Lymphocytes Relative: 25 %
Lymphs Abs: 2.6 10*3/uL (ref 0.7–4.0)
Monocytes Absolute: 0.7 10*3/uL (ref 0.1–1.0)
Monocytes Relative: 7 %
Neutro Abs: 6.9 10*3/uL (ref 1.7–7.7)
Neutrophils Relative %: 66 %

## 2023-10-03 LAB — COMPREHENSIVE METABOLIC PANEL
ALT: 52 U/L — ABNORMAL HIGH (ref 0–44)
AST: 37 U/L (ref 15–41)
Albumin: 4.6 g/dL (ref 3.5–5.0)
Alkaline Phosphatase: 51 U/L (ref 38–126)
Anion gap: 9 (ref 5–15)
BUN: 20 mg/dL (ref 6–20)
CO2: 26 mmol/L (ref 22–32)
Calcium: 9.7 mg/dL (ref 8.9–10.3)
Chloride: 104 mmol/L (ref 98–111)
Creatinine, Ser: 0.68 mg/dL (ref 0.44–1.00)
GFR, Estimated: >60 mL/min (ref >60)
Glucose, Bld: 100 mg/dL — ABNORMAL HIGH (ref 70–99)
Potassium: 4.3 mmol/L (ref 3.5–5.1)
Sodium: 139 mmol/L (ref 135–145)
Total Bilirubin: 0.6 mg/dL (ref 0.0–1.2)
Total Protein: 7.6 g/dL (ref 6.5–8.1)

## 2023-10-03 LAB — PROTIME-INR
INR: 1 (ref 0.8–1.2)
Prothrombin Time: 13.1 s (ref 11.4–15.2)

## 2023-10-03 LAB — CBC
HCT: 40.6 % (ref 36.0–46.0)
Hemoglobin: 13.8 g/dL (ref 12.0–15.0)
MCH: 33.5 pg (ref 26.0–34.0)
MCHC: 34 g/dL (ref 30.0–36.0)
MCV: 98.5 fL (ref 80.0–100.0)
Platelets: 418 10*3/uL — ABNORMAL HIGH (ref 150–400)
RBC: 4.12 MIL/uL (ref 3.87–5.11)
RDW: 13.2 % (ref 11.5–15.5)
WBC: 10.5 10*3/uL (ref 4.0–10.5)
nRBC: 0 % (ref 0.0–0.2)

## 2023-10-03 LAB — CBG MONITORING, ED: Glucose-Capillary: 109 mg/dL — ABNORMAL HIGH (ref 70–99)

## 2023-10-03 LAB — ETHANOL: Alcohol, Ethyl (B): <10 mg/dL (ref <10)

## 2023-10-03 LAB — RAPID URINE DRUG SCREEN, HOSP PERFORMED
Amphetamines: NOT DETECTED
Barbiturates: NOT DETECTED
Benzodiazepines: NOT DETECTED
Cocaine: NOT DETECTED
Opiates: NOT DETECTED
Tetrahydrocannabinol: NOT DETECTED

## 2023-10-03 LAB — URINALYSIS, ROUTINE W REFLEX MICROSCOPIC
Bilirubin Urine: NEGATIVE
Glucose, UA: NEGATIVE mg/dL
Hgb urine dipstick: NEGATIVE
Ketones, ur: NEGATIVE mg/dL
Leukocytes,Ua: NEGATIVE
Nitrite: NEGATIVE
Protein, ur: NEGATIVE mg/dL
Specific Gravity, Urine: 1.004 — ABNORMAL LOW (ref 1.005–1.030)
pH: 5 (ref 5.0–8.0)

## 2023-10-03 LAB — I-STAT CHEM 8, ED
BUN: 24 mg/dL — ABNORMAL HIGH (ref 6–20)
Calcium, Ion: 1.2 mmol/L (ref 1.15–1.40)
Chloride: 104 mmol/L (ref 98–111)
Creatinine, Ser: 0.9 mg/dL (ref 0.44–1.00)
Glucose, Bld: 95 mg/dL (ref 70–99)
HCT: 42 % (ref 36.0–46.0)
Hemoglobin: 14.3 g/dL (ref 12.0–15.0)
Potassium: 4.7 mmol/L (ref 3.5–5.1)
Sodium: 139 mmol/L (ref 135–145)
TCO2: 27 mmol/L (ref 22–32)

## 2023-10-03 LAB — APTT: aPTT: 26 s (ref 24–36)

## 2023-10-03 LAB — HCG, SERUM, QUALITATIVE: Preg, Serum: NEGATIVE

## 2023-10-03 MED ORDER — IOHEXOL 350 MG/ML SOLN
100.0000 mL | Freq: Once | INTRAVENOUS | Status: AC | PRN
  Administered 2023-10-03: 100 mL via INTRAVENOUS

## 2023-10-03 MED ORDER — STROKE: EARLY STAGES OF RECOVERY BOOK
Freq: Once | Status: AC
  Filled 2023-10-03 (×2): qty 1

## 2023-10-03 MED ORDER — ACETAMINOPHEN 160 MG/5ML PO SOLN: 650.0000 mg | ORAL | Status: DC | PRN

## 2023-10-03 MED ORDER — DIPHENHYDRAMINE HCL 25 MG PO CAPS: 50.0000 mg | ORAL_CAPSULE | Freq: Four times a day (QID) | ORAL | Status: DC | PRN

## 2023-10-03 MED ORDER — SENNOSIDES-DOCUSATE SODIUM 8.6-50 MG PO TABS: 1.0000 | ORAL_TABLET | Freq: Every evening | ORAL | Status: DC | PRN

## 2023-10-03 MED ORDER — HYDROXYUREA 500 MG PO CAPS
500.0000 mg | ORAL_CAPSULE | Freq: Every day | ORAL | Status: DC
Start: 2023-10-04 — End: 2023-10-05
  Administered 2023-10-04 – 2023-10-05 (×2): 500 mg via ORAL
  Filled 2023-10-03 (×2): qty 1

## 2023-10-03 MED ORDER — ACETAMINOPHEN 650 MG RE SUPP: 650.0000 mg | RECTAL | Status: DC | PRN

## 2023-10-03 MED ORDER — ACETAMINOPHEN 325 MG PO TABS
650.0000 mg | ORAL_TABLET | Freq: Once | ORAL | Status: AC
  Administered 2023-10-03: 650 mg via ORAL
  Filled 2023-10-03: qty 2

## 2023-10-03 MED ORDER — ACETAMINOPHEN 325 MG PO TABS
650.0000 mg | ORAL_TABLET | ORAL | Status: DC | PRN
  Administered 2023-10-05: 650 mg via ORAL
  Filled 2023-10-03: qty 2

## 2023-10-03 MED ORDER — ASPIRIN 81 MG PO CHEW
81.0000 mg | CHEWABLE_TABLET | Freq: Once | ORAL | Status: AC
  Administered 2023-10-03: 81 mg via ORAL
  Filled 2023-10-03: qty 1

## 2023-10-03 MED ORDER — ATORVASTATIN CALCIUM 10 MG PO TABS
20.0000 mg | ORAL_TABLET | Freq: Every day | ORAL | Status: DC
  Administered 2023-10-03 – 2023-10-04 (×2): 20 mg via ORAL
  Filled 2023-10-03 (×2): qty 2

## 2023-10-03 NOTE — ED Notes (Signed)
Pt no longer has slurred speech, and states that the last thing she remembers is going to the gym this AM. Pt daughter says pt is a lot better, and the confused look in her eyes is gone now.

## 2023-10-03 NOTE — ED Triage Notes (Signed)
Pt here form home with daughter with c/o slurred speech and memory loss , mom up at 0430 then called her daughter around 1050 confused ( memory loss) , and slurred speech , no slurred speech on arrival , no weakness noted

## 2023-10-03 NOTE — ED Notes (Signed)
Gold tubes sent to lab in addition to other lab work

## 2023-10-03 NOTE — H&P (Addendum)
History and Physical    Alicia Robinson  ZOX:[id number]  DOB: Feb 13, 1976  DOA: 10/03/2023 PCP: [name], MD   Patient coming from: home  Chief Complaint: Confusion  HPI: Alicia Robinson is a 48 y.o. female with medical history of thrombocythemia, prior CVA who presents to the hospital with confusion.  Patient is still having short-term memory loss and therefore her daughter gives the history.  Apparently she noted around 10:50 today that the patient was confused.  She did not know where she was and was asking random questions.  In addition she noted that her speech was extremely slow.  This has steadily improved to where she is more oriented and no longer having speech difficulty but it seems that she is still having trouble remembering recent events.  For example she complains of a frontal headache and she cannot remember that she just received Tylenol and has been asking "everyone who comes in the room" for some Tylenol. The patient does not complain of any focal numbness weakness or visual changes.  When discussing her cerebellar infarct, she states that she last had a stroke in 2017 and was told that there was a problem with the artery in the back of her brain.  ED Course:  MRI reveals and acute cerebellar CVA  Review of Systems:  Noted to have a rash on her neck which her daughter states started in the ED.  Patient does not complain of any itching in the area. All other systems reviewed and apart from HPI, are negative.  Past Medical History:  Diagnosis Date   Anxiety    Depression    Drug reaction 10/22/2022   Hyperlipidemia    Hypertension    Stroke [facility]) 2016   "mild"   Suicidal overdose (HCC)     Past Surgical History:  Procedure Laterality Date   CHOLECYSTECTOMY     SIGMOIDOSCOPY     pt is unsure about this   TUBAL LIGATION     UPPER GASTROINTESTINAL ENDOSCOPY      Social History:   reports that she has never smoked. She has never used smokeless tobacco.  She reports current alcohol use. She reports that she does not currently use drugs after having used the following drugs: Marijuana.  Allergies  Allergen Reactions   Bupropion Other (See Comments)    POLYURIAN   Nsaids     Renal insufficiency- no per pt    Family History  Problem Relation Age of Onset   Hypertension Mother    Diabetes Mother    Hypertension Father    Diabetes Father    Diabetes Maternal Grandmother    Hypertension Maternal Grandmother    Colon cancer Neg Hx    Esophageal cancer Neg Hx    Rectal cancer Neg Hx    Stomach cancer Neg Hx      Prior to Admission medications   Medication Sig Start Date End Date Taking? Authorizing Provider  ascorbic acid (VITAMIN C) 500 MG tablet Take 500 mg by mouth daily.    [provider]  atorvastatin (LIPITOR) 20 MG tablet Take 1 tablet (20 mg total) by mouth daily. 05/08/23   [name], MD  azelastine (ASTELIN) 0.1 % nasal spray Place 2 sprays into both nostrils 2 (two) times daily. Use in each nostril as directed 01/02/23   [name], MD  clotrimazole (GYNE-LOTRIMIN) 1 % vaginal cream Place 1 Applicatorful vaginally at bedtime. 09/23/23   Hoy Register, MD  ferrous sulfate 324  MG TBEC Take 1 tablet (324 mg total) by mouth daily with breakfast. 03/26/23   Pollyann Samples, NP  hydroxyurea (HYDREA) 500 MG capsule Take 1 capsule (500 mg total) by mouth daily. 09/16/23   Pollyann Samples, NP  Multiple Vitamins-Minerals (HAIR SKIN AND NAILS FORMULA) TABS Take 1 tablet by mouth daily.    [provider]  olopatadine (PATANOL) 0.1 % ophthalmic solution Place 1 drop into both eyes 2 (two) times daily. 01/02/23   [name], MD  omeprazole (PRILOSEC) 20 MG capsule Take 1 capsule (20 mg total) by mouth daily. 05/08/23   [name], MD  traZODone (DESYREL) 50 MG tablet Take 1-2 tablets (50-100 mg total) by mouth at bedtime as needed for sleep. 09/05/22 09/05/23  Bobbye Morton, MD  Turmeric (QC  TUMERIC COMPLEX) 500 MG CAPS Take 2 tablets by mouth daily.    [provider]  Vitamin D, Ergocalciferol, (DRISDOL) 1.25 MG (50000 UNIT) CAPS capsule Take 1 capsule (50,000 Units total) by mouth every 7 (seven) days. 05/08/23   [name], MD    Physical Exam: Wt Readings from Last 3 Encounters:  09/24/23 95.3 kg  09/23/23 97.2 kg  08/06/23 94.6 kg   Vitals:   10/03/23 1348 10/03/23 1350 10/03/23 1450 10/03/23 1530  BP: (!) 157/94  (!) 153/97 (!) 142/96  Pulse: 80  70 72  Resp: 18  (!) 22 20  Temp:  98.7 F (37.1 C)    TempSrc:  Oral    SpO2: 100%  99% 100%      Constitutional:  Calm & comfortable Eyes: PERRLA, lids and conjunctivae normal ENT:  Mucous membranes are moist.  Pharynx clear of exudate   Normal dentition.  Respiratory:  Clear to auscultation bilaterally  Normal respiratory effort.  Cardiovascular:  S1 & S2 heard, regular rate and rhythm No Murmurs Abdomen:  Non distended No tenderness, No masses Bowel sounds normal Extremities:  No clubbing / cyanosis No pedal edema  Skin:  - erythematous rash encompassing the entire anterior neck Neurologic:  AAO x 3 CN 2-12 grossly intact Sensation intact Strength 5/5 in all 4 extremities Psychiatric:  Normal Mood and affect    Labs on Admission: I have personally reviewed following labs and imaging studies  CBC: Recent Labs  Lab 10/03/23 1417 10/03/23 1421  WBC 10.5  --   NEUTROABS 6.9  --   HGB 13.8 14.3  HCT 40.6 42.0  MCV 98.5  --   PLT 418*  --    Basic Metabolic Panel: Recent Labs  Lab 10/03/23 1417 10/03/23 1421  NA 139 139  K 4.3 4.7  CL 104 104  CO2 26  --   GLUCOSE 100* 95  BUN 20 24*  CREATININE 0.68 0.90  CALCIUM 9.7  --    GFR: Estimated Creatinine Clearance: 94.5 mL/min (by C-G formula based on SCr of 0.9 mg/dL). Liver Function Tests: Recent Labs  Lab 10/03/23 1417  AST 37  ALT 52*  ALKPHOS 51  BILITOT 0.6  PROT 7.6  ALBUMIN 4.6      Coagulation Profile: Recent Labs  Lab 10/03/23 1417  INR 1.0   Cardiac Enzymes: No results for input(s): "CKTOTAL", "CKMB", "CKMBINDEX", "TROPONINI" in the last 168 hours. BNP (last 3 results) No results for input(s): "PROBNP" in the last 8760 hours. HbA1C: No results for input(s): "HGBA1C" in the last 72 hours. CBG: Recent Labs  Lab 10/03/23 1347  GLUCAP 109*     Radiological Exams on  Admission: MR BRAIN WO CONTRAST Result Date: 10/03/2023 CLINICAL DATA:  Neuro deficit, acute, stroke suspected. Slurred speech. Memory loss. EXAM: MRI HEAD WITHOUT CONTRAST TECHNIQUE: Multiplanar, multiecho pulse sequences of the brain and surrounding structures were obtained without intravenous contrast. COMPARISON:  Head CT 10/03/2023 FINDINGS: Brain: There is an 8 mm acute infarct peripherally in the posterosuperior left cerebellar hemisphere. Multiple small chronic cerebellar infarcts are present bilaterally. Scattered small T2 hyperintensities in the cerebral white matter bilaterally are nonspecific but compatible with mild chronic small vessel ischemic disease. The ventricles are normal in size. No intracranial hemorrhage, mass, midline shift, or extra-axial fluid collection is identified. Vascular: Major intracranial vascular flow voids are preserved. Skull and upper cervical spine: Unremarkable bone marrow signal. Sinuses/Orbits: Unremarkable orbits. Right maxillary sinus mucous retention cyst. Trace right mastoid fluid. Other: None. IMPRESSION: 1. Small acute left cerebellar infarct. 2. Chronic bilateral cerebellar infarcts. 3. Mild chronic small vessel ischemic disease in the cerebral white matter. Electronically Signed   By: Sebastian Ache M.D.   On: 10/03/2023 17:01   CT HEAD WO CONTRAST Result Date: 10/03/2023 CLINICAL DATA:  Transient slurred speech and memory loss, concern for TIA EXAM: CT HEAD WITHOUT CONTRAST TECHNIQUE: Contiguous axial images were obtained from the base of the skull  through the vertex without intravenous contrast. RADIATION DOSE REDUCTION: This exam was performed according to the departmental dose-optimization program which includes automated exposure control, adjustment of the mA and/or kV according to patient size and/or use of iterative reconstruction technique. COMPARISON:  11/24/2022 FINDINGS: Brain: No evidence of acute infarction, hemorrhage, mass, mass effect, or midline shift. No hydrocephalus or extra-axial fluid collection. Vascular: No hyperdense vessel. Skull: Negative for fracture or focal lesion. Sinuses/Orbits: No acute finding. Other: The mastoid air cells are well aerated. IMPRESSION: No acute intracranial process. Electronically Signed   By: Wiliam Ke M.D.   On: 10/03/2023 16:01    EKG: Independently reviewed. NSR  Assessment/Plan Principal Problem:   Stroke (HCC) - Acute cerebellar infarct noted on MRI - Interestingly she has prior infarcts in the cerebellum as well - basic CVA work up ordered - I have spoken with Neuro, Dr Amada Jupiter who is recommending a CTA head/neck considering the location of her infarcts -Neurochecks ordered as she is still having symptoms  Active Problems: Rash -Thus far she has only received Tylenol in the ED  which she takes as outpt as well - PRN Benadryl ordered    Essential thrombocythemia (HCC) - cont Hydroxurea   Of note, med rec has not been completed yet but I have ordered a statin and hydroxyurea based on her prior dosages. These will need to be reviewed once med rec done.     DVT prophylaxis: SCD  Code Status: Full code  Consults called: Neuro  Admission status:  Level of care: Telemetry Medical  Calvert Cantor MD [facility]    10/03/2023, 5:41 PM

## 2023-10-03 NOTE — ED Notes (Signed)
Patient transported to MRI 

## 2023-10-03 NOTE — ED Provider Triage Note (Signed)
Emergency Medicine Provider Triage Evaluation Note  Alicia Robinson , a 48 y.o. female  was evaluated in triage.  Pt complains of speech difficulty.  Patient has a prior history of stroke.  Patient was brought in by her daughter.  Patient was last known normal about 4:30 AM.  This morning when she spoke to her daughter at about 1050 she seemed to be confused and did not remember certain things.  She also had some slurred speech..  Review of Systems  Positive: Confusion Negative: Weakness  Physical Exam  BP (!) 157/94 (BP Location: Right Arm)   Pulse 80   Temp 98.7 F (37.1 C) (Oral)   Resp 18   SpO2 100%  Gen:   Awake, no distress   Resp:  Normal effort  MSK:   Moves extremities without difficulty  Other:  Neurologic exam: Patient without aphasia able to name watch painting in computer, no pronator drift bilateral upper extremities, able to lift both legs against gravity without drift, sensation intact throughout, no facial droop  Medical Decision Making  Medically screening exam initiated at 2:10 PM.  Appropriate orders placed.  Alicia Robinson was informed that the remainder of the evaluation will be completed by another provider, this initial triage assessment does not replace that evaluation, and the importance of remaining in the ED until their evaluation is complete.  Patient presents with possible stroke TIA type symptoms.  Last known normal was 0430.  Patient outside tPA window.  LVO screen negative.  Will proceed with stroke TIA workup   Alicia Dibbles, MD 10/03/23 [phone]

## 2023-10-04 ENCOUNTER — Observation Stay (HOSPITAL_BASED_OUTPATIENT_CLINIC_OR_DEPARTMENT_OTHER): Payer: BC Managed Care – PPO

## 2023-10-04 ENCOUNTER — Other Ambulatory Visit: Payer: Self-pay

## 2023-10-04 ENCOUNTER — Observation Stay (HOSPITAL_COMMUNITY): Payer: BC Managed Care – PPO

## 2023-10-04 DIAGNOSIS — I639 Cerebral infarction, unspecified: Secondary | ICD-10-CM

## 2023-10-04 DIAGNOSIS — I6389 Other cerebral infarction: Secondary | ICD-10-CM

## 2023-10-04 DIAGNOSIS — Z7982 Long term (current) use of aspirin: Secondary | ICD-10-CM | POA: Diagnosis not present

## 2023-10-04 DIAGNOSIS — I10 Essential (primary) hypertension: Secondary | ICD-10-CM | POA: Diagnosis not present

## 2023-10-04 DIAGNOSIS — Z79899 Other long term (current) drug therapy: Secondary | ICD-10-CM | POA: Diagnosis not present

## 2023-10-04 DIAGNOSIS — D473 Essential (hemorrhagic) thrombocythemia: Secondary | ICD-10-CM | POA: Diagnosis not present

## 2023-10-04 DIAGNOSIS — R4182 Altered mental status, unspecified: Secondary | ICD-10-CM | POA: Diagnosis present

## 2023-10-04 DIAGNOSIS — R21 Rash and other nonspecific skin eruption: Secondary | ICD-10-CM | POA: Diagnosis not present

## 2023-10-04 DIAGNOSIS — E785 Hyperlipidemia, unspecified: Secondary | ICD-10-CM | POA: Diagnosis not present

## 2023-10-04 LAB — LIPID PANEL
Cholesterol: 144 mg/dL (ref 0–200)
HDL: 59 mg/dL (ref >40)
LDL Cholesterol: 72 mg/dL (ref 0–99)
Total CHOL/HDL Ratio: 2.4 {ratio}
Triglycerides: 64 mg/dL (ref <150)
VLDL: 13 mg/dL (ref 0–40)

## 2023-10-04 LAB — ECHOCARDIOGRAM COMPLETE
Area-P 1/2: 1.96 cm2
S' Lateral: 2.9 cm

## 2023-10-04 LAB — HEMOGLOBIN A1C
Hgb A1c MFr Bld: 5.4 % (ref 4.8–5.6)
Mean Plasma Glucose: 108.28 mg/dL

## 2023-10-04 LAB — HIV ANTIBODY (ROUTINE TESTING W REFLEX): HIV Screen 4th Generation wRfx: NONREACTIVE

## 2023-10-04 MED ORDER — ASPIRIN 81 MG PO TBEC
81.0000 mg | DELAYED_RELEASE_TABLET | Freq: Every day | ORAL | Status: DC
  Administered 2023-10-04 – 2023-10-05 (×2): 81 mg via ORAL
  Filled 2023-10-04 (×2): qty 1

## 2023-10-04 MED ORDER — CLOPIDOGREL BISULFATE 75 MG PO TABS
75.0000 mg | ORAL_TABLET | Freq: Every day | ORAL | Status: DC
  Administered 2023-10-04 – 2023-10-05 (×2): 75 mg via ORAL
  Filled 2023-10-04 (×3): qty 1

## 2023-10-04 MED ORDER — ATORVASTATIN CALCIUM 40 MG PO TABS
40.0000 mg | ORAL_TABLET | Freq: Every day | ORAL | Status: DC
  Administered 2023-10-05: 40 mg via ORAL
  Filled 2023-10-04: qty 1

## 2023-10-04 NOTE — Progress Notes (Signed)
Occupational Therapy Evaluation Patient Details Name: Alicia Robinson MRN: [id number] DOB: 18-Apr-1976 Today's Date: 10/04/2023   History of Present Illness Alicia Robinson is a 48 y.o. female who presents to the hospital with confusion.MRI reveals and acute cerebellar CVA, as well as  previous cerebellar infarcts.PMH: thrombocythemia, prior CVA in 2017   Clinical Impression   Patient is a 48 year old female who was admitted for above.  Patient was living at home alone prior level. Patient was noted to have one onset of blurry vision and dizziness with mobility in hallway with BP of 125/94 mmhg. Patient reporting symptoms gone upon return to bed. Patient was CGA for toileting tasks with unsteadiness noted with mobility. Patient was educated on having support for next few days from family with patient indicating that daughter can drop patient off at patients mothers house to have someone nearby. Recommended for patient to have caregiver support in next level of care. No OT follow up anticipated in next level of care.       If plan is discharge home, recommend the following: Assistance with cooking/housework;Direct supervision/assist for medications management;Assist for transportation;Help with stairs or ramp for entrance;Direct supervision/assist for financial management    Functional Status Assessment  Patient has had a recent decline in their functional status and demonstrates the ability to make significant improvements in function in a reasonable and predictable amount of time.  Equipment Recommendations  None recommended by OT       Precautions / Restrictions Precautions Precautions: Fall Restrictions Weight Bearing Restrictions Per Provider Order: No      Mobility Bed Mobility Overal bed mobility: Independent                  Transfers Overall transfer level: Independent                            ADL either performed or assessed with clinical judgement    ADL Overall ADL's : Needs assistance/impaired Eating/Feeding: Modified independent;Sitting   Grooming: Supervision/safety;Set up;Standing Grooming Details (indicate cue type and reason): at sink in bathroom no overt LOB but unsteady Upper Body Bathing: Set up;Sitting   Lower Body Bathing: Set up;Sit to/from stand;Sitting/lateral leans   Upper Body Dressing : Set up;Sitting   Lower Body Dressing: Contact guard assist;Supervision/safety;Sit to/from stand Lower Body Dressing Details (indicate cue type and reason): to don sneakers. pants after toileting tasks. Toilet Transfer: Supervision/safety;Contact guard Engineer, manufacturing Details (indicate cue type and reason): walking to bathroom down hallway with unsteady gait no overt LOB Toileting- Clothing Manipulation and Hygiene: Contact guard assist;Sit to/from stand               Vision Patient Visual Report: No change from baseline Vision Assessment?: No apparent visual deficits            Pertinent Vitals/Pain Pain Assessment Pain Assessment: Faces Faces Pain Scale: Hurts whole lot Pain Location: HA Pain Descriptors / Indicators: Discomfort, Aching Pain Intervention(s): Limited activity within patient's tolerance, Monitored during session     Extremity/Trunk Assessment Upper Extremity Assessment Upper Extremity Assessment: Overall WFL for tasks assessed   Lower Extremity Assessment Lower Extremity Assessment: Defer to PT evaluation   Cervical / Trunk Assessment Cervical / Trunk Assessment: Normal   Communication Communication Communication: No apparent difficulties   Cognition Arousal: Alert Behavior During Therapy: WFL for tasks assessed/performed Overall Cognitive Status: Within Functional Limits for tasks assessed  General Comments: patient oriented, able to remember what MD said earlier in AM. reported feeling at baseline                Home Living Family/patient expects to be  discharged to:: Private residence Living Arrangements: Alone Available Help at Discharge: Family;Available PRN/intermittently Type of Home: Apartment Home Access: Stairs to enter Entrance Stairs-Number of Steps: flight Entrance Stairs-Rails: Right;Left Home Layout: One level     Bathroom Shower/Tub: Chief Strategy Officer: Standard     Home Equipment: None   Additional Comments: daughter  leaving country 1/20      Prior Functioning/Environment Prior Level of Function : Working/employed;Independent/Modified Independent;Driving                        OT Problem List: Decreased activity tolerance;Impaired balance (sitting and/or standing);Decreased safety awareness;Decreased knowledge of use of DME or AE      OT Treatment/Interventions: Patient/family education;Self-care/ADL training;Therapeutic activities;Balance training    OT Goals(Current goals can be found in the care plan section) Acute Rehab OT Goals Patient Stated Goal: to get better OT Goal Formulation: With patient Time For Goal Achievement: 10/18/23 Potential to Achieve Goals: Fair  OT Frequency: Min 1X/week    Co-evaluation   Reason for Co-Treatment: To address functional/ADL transfers PT goals addressed during session: Mobility/safety with mobility OT goals addressed during session: ADL's and self-care      AM-PAC OT "6 Clicks" Daily Activity     Outcome Measure Help from another person eating meals?: None Help from another person taking care of personal grooming?: None Help from another person toileting, which includes using toliet, bedpan, or urinal?: A Little Help from another person bathing (including washing, rinsing, drying)?: A Little Help from another person to put on and taking off regular upper body clothing?: None Help from another person to put on and taking off regular lower body clothing?: A Little 6 Click Score: 21   End of Session    Activity Tolerance: Patient  tolerated treatment well Patient left: in bed;with call bell/phone within reach (in ED)  OT Visit Diagnosis: Unsteadiness on feet (R26.81);Other abnormalities of gait and mobility (R26.89)                Time: 1001-1021 OT Time Calculation (min): 20 min Charges:  OT General Charges $OT Visit: 1 Visit OT Evaluation $OT Eval Low Complexity: 1 Low  Mavrik Bynum OTR/L, MS Acute Rehabilitation Department Office# [phone]   Selinda Flavin 10/04/2023, 4:36 PM

## 2023-10-04 NOTE — Evaluation (Addendum)
Physical Therapy Evaluation Patient Details Name: Alicia Robinson MRN: [id number] DOB: [birth year] Today's Date: 10/04/2023  History of Present Illness  Alicia Robinson is a 48 y.o. female with medical history of thrombocythemia, prior CVA in 2017 who presents to the hospital with confusion.MRI reveals and acute cerebellar CVA, as well as  previous cerebellar infarcts.  Clinical Impression  Pt admitted with above diagnosis.  Pt currently with functional limitations due to the deficits listed below (see PT Problem List). Pt will benefit from acute skilled PT to increase their independence and safety with mobility to allow discharge.     Patient reports  that she has been ambulating to Br without assistance. Patient reports that she feels at her baseline.  Patient  ambulated x 40' and reported onset of blurry vision, HA and dizziness. Patient ambulated back to room./ BP 127/91. Patient reported symptoms subsided and ambulated again x 50' x 2. BP 125/94.  Patient requires no device.Patient is noted to  have  a slightly guarded gait , Appeared legs wobbly, not ataxic,no balance loss noted.  Patient  has a daughter present  but will be leaving town soon.Other  family  lives Out of town.  Continue PT  while in acute care to ensure no changes in function as patient lives alone in second floor apartment.      If plan is discharge home, recommend the following: Assistance with cooking/housework;Assist for transportation;Help with stairs or ramp for entrance   Can travel by private vehicle        Equipment Recommendations None recommended by PT  Recommendations for Other Services       Functional Status Assessment Patient has had a recent decline in their functional status and demonstrates the ability to make significant improvements in function in a reasonable and predictable amount of time.     Precautions / Restrictions Precautions Precautions: Fall Restrictions Weight Bearing Restrictions Per  Provider Order: No      Mobility  Bed Mobility Overal bed mobility: Independent                  Transfers Overall transfer level: Independent                      Ambulation/Gait Ambulation/Gait assistance: Independent Gait Distance (Feet): 80 Feet (then 50 x 2) Assistive device: None Gait Pattern/deviations: Step-through pattern, Drifts right/left Gait velocity: decr     General Gait Details: after first 44' pt. reported increased HA and blurred vision and felt dizzy.  Patient returned to room,  BP taken. Patient then asked to ambulate to BR, no further  episode of dizziness. Gait is slightly off, slightly bobs legs.  Stairs            Wheelchair Mobility     Tilt Bed    Modified Rankin (Stroke Patients Only)       Balance Overall balance assessment: Needs assistance   Sitting balance-Leahy Scale: Normal     Standing balance support: No upper extremity supported Standing balance-Leahy Scale: Good                               Pertinent Vitals/Pain Pain Assessment Pain Assessment: Faces Faces Pain Scale: Hurts whole lot Pain Location: HA Pain Descriptors / Indicators: Discomfort, Aching Pain Intervention(s): Monitored during session, Limited activity within patient's tolerance    Home Living Family/patient expects to be discharged to:: Private residence Living Arrangements:  Alone Available Help at Discharge: Family;Available PRN/intermittently Type of Home: Apartment Home Access: Stairs to enter Entrance Stairs-Rails: Right;Left Entrance Stairs-Number of Steps: flight   Home Layout: One level Home Equipment: None Additional Comments: daughter  leaving country 1/20    Prior Function Prior Level of Function : Working/employed;Independent/Modified Independent;Driving                     Extremity/Trunk Assessment        Lower Extremity Assessment Lower Extremity Assessment: Overall WFL for tasks  assessed    Cervical / Trunk Assessment Cervical / Trunk Assessment: Normal  Communication   Communication Communication: No apparent difficulties  Cognition Arousal: Alert Behavior During Therapy: WFL for tasks assessed/performed Overall Cognitive Status: Within Functional Limits for tasks assessed                                          General Comments      Exercises     Assessment/Plan    PT Assessment Patient needs continued PT services  PT Problem List Decreased balance;Decreased knowledge of precautions;Decreased mobility;Decreased activity tolerance       PT Treatment Interventions Therapeutic activities;Gait training;Patient/family education;Functional mobility training    PT Goals (Current goals can be found in the Care Plan section)  Acute Rehab PT Goals Patient Stated Goal: go home PT Goal Formulation: With patient Time For Goal Achievement: 10/18/23 Potential to Achieve Goals: Good    Frequency Min 1X/week     Co-evaluation PT/OT/SLP Co-Evaluation/Treatment: Yes Reason for Co-Treatment: To address functional/ADL transfers PT goals addressed during session: Mobility/safety with mobility OT goals addressed during session: ADL's and self-care       AM-PAC PT "6 Clicks" Mobility  Outcome Measure Help needed turning from your back to your side while in a flat bed without using bedrails?: None Help needed moving from lying on your back to sitting on the side of a flat bed without using bedrails?: None Help needed moving to and from a bed to a chair (including a wheelchair)?: None Help needed standing up from a chair using your arms (e.g., wheelchair or bedside chair)?: None Help needed to walk in hospital room?: A Little Help needed climbing 3-5 steps with a railing? : A Little 6 Click Score: 22    End of Session   Activity Tolerance: Patient tolerated treatment well Patient left: in bed;with call bell/phone within reach Nurse  Communication: Mobility status PT Visit Diagnosis: Unsteadiness on feet (R26.81);Difficulty in walking, not elsewhere classified (R26.2)    Time: 1610-9604 PT Time Calculation (min) (ACUTE ONLY): 21 min   Charges:   PT Evaluation $PT Eval Low Complexity: 1 Low   PT General Charges $$ ACUTE PT VISIT: 1 Visit         Blanchard Kelch PT Acute Rehabilitation Services Office [phone] Weekend [phone]   Rada Hay 10/04/2023, 12:17 PM

## 2023-10-04 NOTE — ED Notes (Addendum)
Call placed to [facility] for transportation

## 2023-10-04 NOTE — Progress Notes (Signed)
Echocardiogram 2D Echocardiogram has been performed.  Warren Lacy Taris Galindo RDCS 10/04/2023, 9:16 AM

## 2023-10-04 NOTE — Progress Notes (Signed)
[facility] Progress Note  Patient: Alicia Robinson     VHQ:[id number]  DOA: 10/03/2023   PCP: [name], MD       HPI:  Alicia Robinson is a 48 y.o. female with medical history of thrombocythemia, prior CVA who presents to the hospital with confusion.  Patient is still having short-term memory loss and therefore her daughter gives the history.  Apparently she noted around 10:50 today that the patient was confused.  She did not know where she was and was asking random questions.  In addition she noted that her speech was extremely slow.  This has steadily improved to where she is more oriented and no longer having speech difficulty but it seems that she is still having trouble remembering recent events.  For example she complains of a frontal headache and she cannot remember that she just received Tylenol and has been asking "everyone who comes in the room" for some Tylenol.   Subjective:  Still has a mild headache. Vision a little blurry and may be due to not having her glasses.   Assessment and Plan: Principal Problem:   Stroke [facility]) - Acute cerebellar infarct noted on MRI - Interestingly she has prior infarcts in the cerebellum as well - short term memory issues resolved - f/u neuro w/u and recommendations - no therapy needs   Active Problems: Rash - resolved     Essential thrombocythemia (HCC) - cont Hydroxurea       Code Status: Full Code Total time on patient care: 25 min DVT prophylaxis:  SCD's Start: 10/03/23 1809     Objective:   Vitals:   10/04/23 0509 10/04/23 0800 10/04/23 1107 10/04/23 1535  BP: (!) 120/58 132/81 (!) 172/111 (!) 136/90  Pulse: 67 64 72 66  Resp: 17 18 18 17   Temp: 97.8 F (36.6 C) 97.8 F (36.6 C) 97.8 F (36.6 C) (!) 97.2 F (36.2 C)  TempSrc: Oral Oral Oral Oral  SpO2: 98% 99% 100% 97%   There were no vitals filed for this visit. Exam: General exam: Appears comfortable  HEENT: oral mucosa moist Respiratory system:  Clear to auscultation.  Cardiovascular system: S1 & S2 heard  Gastrointestinal system: Abdomen soft, non-tender, nondistended. Normal bowel sounds   Extremities: No cyanosis, clubbing or edema Psychiatry:  Mood & affect appropriate.      CBC: Recent Labs  Lab 10/03/23 1417 10/03/23 1421  WBC 10.5  --   NEUTROABS 6.9  --   HGB 13.8 14.3  HCT 40.6 42.0  MCV 98.5  --   PLT 418*  --    Basic Metabolic Panel: Recent Labs  Lab 10/03/23 1417 10/03/23 1421  NA 139 139  K 4.3 4.7  CL 104 104  CO2 26  --   GLUCOSE 100* 95  BUN 20 24*  CREATININE 0.68 0.90  CALCIUM 9.7  --      Scheduled Meds:   stroke: early stages of recovery book   Does not apply Once   atorvastatin  20 mg Oral Daily   hydroxyurea  500 mg Oral Daily    Imaging and lab data personally reviewed   Author: Calvert Cantor  10/04/2023 4:12 PM  To contact [facility]>   Check the care team in [facility] and look for the attending/consulting [facility] provider listed  Log into [email or web address] and use [facility] universal password   Go to> "[facility]"  and find provider  If you still have difficulty reaching the provider, please  page the [facility] (Director on Call) for the Hospitalists listed on amion

## 2023-10-04 NOTE — ED Provider Notes (Signed)
[name] Provider Note   CSN: [id number] Arrival date & time: 10/03/23  1342     History  Chief Complaint  Patient presents with   Memory Loss    Alicia Robinson is a 48 y.o. female.  Patient had an episode today where she was confused and had slurred speech according to her daughter.  Now the daughter states she is back to normal  The history is provided by the patient and medical records. No language interpreter was used.  Altered Mental Status Presenting symptoms: confusion   Severity:  Mild Most recent episode:  Today Episode history:  Single Timing:  Intermittent Chronicity:  New Context: not alcohol use   Associated symptoms: no abdominal pain, no hallucinations, no headaches, no rash and no seizures        Home Medications Prior to Admission medications   Medication Sig Start Date End Date Taking? Authorizing Provider  ascorbic acid (VITAMIN C) 500 MG tablet Take 500 mg by mouth daily.   Yes [provider]  atorvastatin (LIPITOR) 20 MG tablet Take 1 tablet (20 mg total) by mouth daily. 05/08/23  Yes [name], MD  ferrous sulfate 324 MG TBEC Take 1 tablet (324 mg total) by mouth daily with breakfast. 03/26/23  Yes Pollyann Samples, NP  hydroxyurea (HYDREA) 500 MG capsule Take 1 capsule (500 mg total) by mouth daily. 09/16/23  Yes Pollyann Samples, NP  Multiple Vitamins-Minerals (HAIR SKIN AND NAILS FORMULA) TABS Take 1 tablet by mouth daily.   Yes [provider]  omeprazole (PRILOSEC) 20 MG capsule Take 1 capsule (20 mg total) by mouth daily. 05/08/23  Yes [name], MD  Vitamin D, Ergocalciferol, (DRISDOL) 1.25 MG (50000 UNIT) CAPS capsule Take 1 capsule (50,000 Units total) by mouth every 7 (seven) days. 05/08/23  Yes [name], MD  azelastine (ASTELIN) 0.1 % nasal spray Place 2 sprays into both nostrils 2 (two) times daily. Use in each nostril as directed 01/02/23   [name], MD  clotrimazole (GYNE-LOTRIMIN) 1 % vaginal cream Place 1 Applicatorful vaginally at bedtime. 09/23/23   Hoy Register, MD  olopatadine (PATANOL) 0.1 % ophthalmic solution Place 1 drop into both eyes 2 (two) times daily. 01/02/23   [name], MD  traZODone (DESYREL) 50 MG tablet Take 1-2 tablets (50-100 mg total) by mouth at bedtime as needed for sleep. 09/05/22 09/05/23  Bobbye Morton, MD  Turmeric (QC TUMERIC COMPLEX) 500 MG CAPS Take 2 tablets by mouth daily.    [provider]      Allergies    Bupropion, Aspirin, and Nsaids    Review of Systems   Review of Systems  Constitutional:  Negative for appetite change and fatigue.  HENT:  Negative for congestion, ear discharge and sinus pressure.   Eyes:  Negative for discharge.  Respiratory:  Negative for cough.   Cardiovascular:  Negative for chest pain.  Gastrointestinal:  Negative for abdominal pain and diarrhea.  Genitourinary:  Negative for frequency and hematuria.  Musculoskeletal:  Negative for back pain.  Skin:  Negative for rash.  Neurological:  Negative for seizures and headaches.       Slurred speech  Psychiatric/Behavioral:  Positive for confusion. Negative for hallucinations.     Physical Exam Updated Vital Signs BP (!) 172/111 (BP Location: Left Arm)   Pulse 72   Temp 97.8 F (36.6 C) (Oral)   Resp 18   SpO2  100%  Physical Exam Vitals and nursing note reviewed.  Constitutional:      Appearance: She is well-developed.  HENT:     Head: Normocephalic.     Nose: Nose normal.  Eyes:     General: No scleral icterus.    Conjunctiva/sclera: Conjunctivae normal.  Neck:     Thyroid: No thyromegaly.  Cardiovascular:     Rate and Rhythm: Normal rate and regular rhythm.     Heart sounds: No murmur heard.    No friction rub. No gallop.  Pulmonary:     Breath sounds: No stridor. No wheezing or rales.  Chest:     Chest wall: No tenderness.  Abdominal:     General: There is no distension.      Tenderness: There is no abdominal tenderness. There is no rebound.  Musculoskeletal:        General: Normal range of motion.     Cervical back: Neck supple.  Lymphadenopathy:     Cervical: No cervical adenopathy.  Skin:    Findings: No erythema or rash.  Neurological:     Mental Status: She is alert and oriented to person, place, and time.     Motor: No abnormal muscle tone.     Coordination: Coordination normal.  Psychiatric:        Behavior: Behavior normal.     ED Results / Procedures / Treatments   Labs (all labs ordered are listed, but only abnormal results are displayed) Labs Reviewed  CBC - Abnormal; Notable for the following components:      Result Value   Platelets 418 (*)    All other components within normal limits  COMPREHENSIVE METABOLIC PANEL - Abnormal; Notable for the following components:   Glucose, Bld 100 (*)    ALT 52 (*)    All other components within normal limits  URINALYSIS, ROUTINE W REFLEX MICROSCOPIC - Abnormal; Notable for the following components:   Color, Urine COLORLESS (*)    Specific Gravity, Urine 1.004 (*)    All other components within normal limits  CBG MONITORING, ED - Abnormal; Notable for the following components:   Glucose-Capillary 109 (*)    All other components within normal limits  I-STAT CHEM 8, ED - Abnormal; Notable for the following components:   BUN 24 (*)    All other components within normal limits  ETHANOL  PROTIME-INR  APTT  DIFFERENTIAL  RAPID URINE DRUG SCREEN, HOSP PERFORMED  HCG, SERUM, QUALITATIVE  HIV ANTIBODY (ROUTINE TESTING W REFLEX)  LIPID PANEL  HEMOGLOBIN A1C    EKG None  Radiology CT ANGIO HEAD NECK W WO CM Result Date: 10/03/2023 CLINICAL DATA:  Slurred speech and memory loss EXAM: CT ANGIOGRAPHY HEAD AND NECK WITH AND WITHOUT CONTRAST TECHNIQUE: Multidetector CT imaging of the head and neck was performed using the standard protocol during bolus administration of intravenous contrast.  Multiplanar CT image reconstructions and MIPs were obtained to evaluate the vascular anatomy. Carotid stenosis measurements (when applicable) are obtained utilizing NASCET criteria, using the distal internal carotid diameter as the denominator. RADIATION DOSE REDUCTION: This exam was performed according to the departmental dose-optimization program which includes automated exposure control, adjustment of the mA and/or kV according to patient size and/or use of iterative reconstruction technique. CONTRAST:  OMNIPAQUE IOHEXOL 350 MG/ML SOLN COMPARISON:  None Available. FINDINGS: CTA NECK FINDINGS Skeleton: No acute abnormality or high grade bony spinal canal stenosis. Other neck: Normal pharynx, larynx and major salivary glands. No cervical lymphadenopathy. Unremarkable thyroid  gland. Upper chest: No pneumothorax or pleural effusion. No nodules or masses. Aortic arch: There is no calcific atherosclerosis of the aortic arch. Conventional 3 vessel aortic branching pattern. RIGHT carotid system: Normal without aneurysm, dissection or stenosis. LEFT carotid system: Normal without aneurysm, dissection or stenosis. Vertebral arteries: Codominant configuration. There is no dissection, occlusion or flow-limiting stenosis to the skull base (V1-V3 segments). CTA HEAD FINDINGS POSTERIOR CIRCULATION: Vertebral arteries are normal. No proximal occlusion of the anterior or inferior cerebellar arteries. Basilar artery is normal. Superior cerebellar arteries are normal. Posterior cerebral arteries are normal. ANTERIOR CIRCULATION: Intracranial internal carotid arteries are normal. Anterior cerebral arteries are normal. Middle cerebral arteries are normal. Venous sinuses: As permitted by contrast timing, patent. Anatomic variants: None Review of the MIP images confirms the above findings. IMPRESSION: No emergent large vessel occlusion or high-grade stenosis of the intracranial arteries. Electronically Signed   By: Deatra Robinson  M.D.   On: 10/03/2023 21:48   MR BRAIN WO CONTRAST Result Date: 10/03/2023 CLINICAL DATA:  Neuro deficit, acute, stroke suspected. Slurred speech. Memory loss. EXAM: MRI HEAD WITHOUT CONTRAST TECHNIQUE: Multiplanar, multiecho pulse sequences of the brain and surrounding structures were obtained without intravenous contrast. COMPARISON:  Head CT 10/03/2023 FINDINGS: Brain: There is an 8 mm acute infarct peripherally in the posterosuperior left cerebellar hemisphere. Multiple small chronic cerebellar infarcts are present bilaterally. Scattered small T2 hyperintensities in the cerebral white matter bilaterally are nonspecific but compatible with mild chronic small vessel ischemic disease. The ventricles are normal in size. No intracranial hemorrhage, mass, midline shift, or extra-axial fluid collection is identified. Vascular: Major intracranial vascular flow voids are preserved. Skull and upper cervical spine: Unremarkable bone marrow signal. Sinuses/Orbits: Unremarkable orbits. Right maxillary sinus mucous retention cyst. Trace right mastoid fluid. Other: None. IMPRESSION: 1. Small acute left cerebellar infarct. 2. Chronic bilateral cerebellar infarcts. 3. Mild chronic small vessel ischemic disease in the cerebral white matter. Electronically Signed   By: Sebastian Ache M.D.   On: 10/03/2023 17:01   CT HEAD WO CONTRAST Result Date: 10/03/2023 CLINICAL DATA:  Transient slurred speech and memory loss, concern for TIA EXAM: CT HEAD WITHOUT CONTRAST TECHNIQUE: Contiguous axial images were obtained from the base of the skull through the vertex without intravenous contrast. RADIATION DOSE REDUCTION: This exam was performed according to the departmental dose-optimization program which includes automated exposure control, adjustment of the mA and/or kV according to patient size and/or use of iterative reconstruction technique. COMPARISON:  11/24/2022 FINDINGS: Brain: No evidence of acute infarction, hemorrhage, mass,  mass effect, or midline shift. No hydrocephalus or extra-axial fluid collection. Vascular: No hyperdense vessel. Skull: Negative for fracture or focal lesion. Sinuses/Orbits: No acute finding. Other: The mastoid air cells are well aerated. IMPRESSION: No acute intracranial process. Electronically Signed   By: Wiliam Ke M.D.   On: 10/03/2023 16:01    Procedures Procedures    Medications Ordered in ED Medications   stroke: early stages of recovery book (has no administration in time range)  acetaminophen (TYLENOL) tablet 650 mg (has no administration in time range)    Or  acetaminophen (TYLENOL) 160 MG/5ML solution 650 mg (has no administration in time range)    Or  acetaminophen (TYLENOL) suppository 650 mg (has no administration in time range)  senna-docusate (Senokot-S) tablet 1 tablet (has no administration in time range)  atorvastatin (LIPITOR) tablet 20 mg (20 mg Oral Given 10/04/23 1100)  hydroxyurea (HYDREA) capsule 500 mg (500 mg Oral Given 10/04/23 1100)  diphenhydrAMINE (BENADRYL)  capsule 50 mg (has no administration in time range)  acetaminophen (TYLENOL) tablet 650 mg (650 mg Oral Given 10/03/23 1519)  aspirin chewable tablet 81 mg (81 mg Oral Given 10/03/23 1835)  iohexol (OMNIPAQUE) 350 MG/ML injection 100 mL (100 mLs Intravenous Contrast Given 10/03/23 2043)    ED Course/ Medical Decision Making/ A&P     CRITICAL CARE Performed by: Bethann Berkshire Total critical care time: 40 minutes Critical care time was exclusive of separately billable procedures and treating other patients. Critical care was necessary to treat or prevent imminent or life-threatening deterioration. Critical care was time spent personally by me on the following activities: development of treatment plan with patient and/or surrogate as well as nursing, discussions with consultants, evaluation of patient's response to treatment, examination of patient, obtaining history from patient or surrogate, ordering  and performing treatments and interventions, ordering and review of laboratory studies, ordering and review of radiographic studies, pulse oximetry and re-evaluation of patient's condition.   1}                              Medical Decision Making Amount and/or Complexity of Data Reviewed Labs: ordered. Radiology: ordered.  Risk OTC drugs. Decision regarding hospitalization.  This patient presents to the ED for concern of slurred speech and confusion, this involves an extensive number of treatment options, and is a complaint that carries with it a high risk of complications and morbidity.  The differential diagnosis includes metabolic encephalopathy, stroke   Co morbidities that complicate the patient evaluation  History of stroke   Additional history obtained:  Additional history obtained from daughter External records from outside source obtained and reviewed including full records   Lab Tests:  I Ordered, and personally interpreted labs.  The pertinent results include: CBC and chemistries unremarkable   Imaging Studies ordered:  I ordered imaging studies including MRI brain I independently visualized and interpreted imaging which showed acute stroke I agree with the radiologist interpretation   Cardiac Monitoring: / EKG:  The patient was maintained on a cardiac monitor.  I personally viewed and interpreted the cardiac monitored which showed an underlying rhythm of: Normal sinus rhythm   Consultations Obtained:  I requested consultation with the neurology and hospitalist,  and discussed lab and imaging findings as well as pertinent plan - they recommend: Mid to hospitalist with neurology consult   Problem List / ED Course / Critical interventions / Medication management  Stroke No medicines ordered Reevaluation of the patient after these medicines showed that the patient improved I have reviewed the patients home medicines and have made adjustments as  needed   Social Determinants of Health:  None   Test / Admission - Considered:  None MRI shows acute stroke.  I spoke with neurology Dr. Amada Jupiter and he recommended admit to [facility] by the hospitalist with neurology consult Acute stroke   Final Clinical Impression(s) / ED Diagnoses Final diagnoses:  None    Rx / DC Orders ED Discharge Orders     None         Bethann Berkshire, MD 10/04/23 1205

## 2023-10-04 NOTE — Progress Notes (Signed)
EEG complete - results pending 

## 2023-10-04 NOTE — Consult Note (Signed)
NEUROLOGY CONSULT NOTE   Date of service: October 04, 2023 Patient Name: Alicia Robinson MRN:  [id number] DOB:  May 18, 1976 Chief Complaint: "confusion" Requesting Provider: Calvert Cantor, MD  History of Present Illness  Alicia Robinson is a 48 y.o. 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 MRI revealed small acute left cerebellar infarct and chronic bilateral cerebellar infarcts. CTA negative for LVO.   Per chart review, prior stroke was attributed to lifestyle and stress. Allergy to aspirin is noted. She is not on any blood thinner, takes Lipitor 20mg  for HLD.   ROS  Comprehensive ROS performed and pertinent positives documented in HPI   Past History   Past Medical History:  Diagnosis Date   Anxiety    Depression    Drug reaction 10/22/2022   Hyperlipidemia    Hypertension    Stroke [facility]) 2016   "mild"   Suicidal overdose (HCC)     Past Surgical History:  Procedure Laterality Date   CHOLECYSTECTOMY     SIGMOIDOSCOPY     pt is unsure about this   TUBAL LIGATION     UPPER GASTROINTESTINAL ENDOSCOPY      Family History: Family History  Problem Relation Age of Onset   Hypertension Mother    Diabetes Mother    Hypertension Father    Diabetes Father    Diabetes Maternal Grandmother    Hypertension Maternal Grandmother    Colon cancer Neg Hx    Esophageal cancer Neg Hx    Rectal cancer Neg Hx    Stomach cancer Neg Hx     Social History  reports that she has never smoked. She has never used smokeless tobacco. She reports current alcohol use. She reports that she does not currently use drugs after having used the following drugs: Marijuana.  Allergies  Allergen Reactions   Bupropion Other (See Comments)    POLYURIAN   Aspirin     Kidney Damage   Nsaids     Renal  insufficiency- no per pt    Medications   Current Facility-Administered Medications:     stroke: early stages of recovery book, , Does not apply, Once, Calvert Cantor, MD   acetaminophen (TYLENOL) tablet 650 mg, 650 mg, Oral, Q4H PRN **OR** acetaminophen (TYLENOL) 160 MG/5ML solution 650 mg, 650 mg, Per Tube, Q4H PRN **OR** acetaminophen (TYLENOL) suppository 650 mg, 650 mg, Rectal, Q4H PRN, Rizwan, Saima, MD   atorvastatin (LIPITOR) tablet 20 mg, 20 mg, Oral, Daily, Rizwan, Saima, MD, 20 mg at 10/03/23 1835   diphenhydrAMINE (BENADRYL) capsule 50 mg, 50 mg, Oral, Q6H PRN, Rizwan, Saima, MD   hydroxyurea (HYDREA) capsule 500 mg, 500 mg, Oral, Daily, Rizwan, Saima, MD   senna-docusate (Senokot-S) tablet 1 tablet, 1 tablet, Oral, QHS PRN, Calvert Cantor, MD  Current Outpatient Medications:    ascorbic acid (VITAMIN C) 500 MG tablet, Take 500 mg by mouth daily., Disp: , Rfl:    atorvastatin (LIPITOR) 20 MG tablet, Take 1 tablet (20 mg total) by mouth daily., Disp: 90 tablet, Rfl: 1   ferrous sulfate 324 MG TBEC, Take 1 tablet (324 mg total) by mouth daily with breakfast., Disp: 30 tablet, Rfl: 3   hydroxyurea (HYDREA) 500 MG capsule, Take 1 capsule (500 mg total) by mouth daily., Disp: 30 capsule, Rfl: 2   Multiple Vitamins-Minerals (HAIR SKIN AND NAILS FORMULA)  TABS, Take 1 tablet by mouth daily., Disp: , Rfl:    omeprazole (PRILOSEC) 20 MG capsule, Take 1 capsule (20 mg total) by mouth daily., Disp: 90 capsule, Rfl: 2   Vitamin D, Ergocalciferol, (DRISDOL) 1.25 MG (50000 UNIT) CAPS capsule, Take 1 capsule (50,000 Units total) by mouth every 7 (seven) days., Disp: 14 capsule, Rfl: 3   azelastine (ASTELIN) 0.1 % nasal spray, Place 2 sprays into both nostrils 2 (two) times daily. Use in each nostril as directed, Disp: 30 mL, Rfl: 12   clotrimazole (GYNE-LOTRIMIN) 1 % vaginal cream, Place 1 Applicatorful vaginally at bedtime., Disp: 45 g, Rfl: 0   olopatadine (PATANOL) 0.1 % ophthalmic solution,  Place 1 drop into both eyes 2 (two) times daily., Disp: 5 mL, Rfl: 12   traZODone (DESYREL) 50 MG tablet, Take 1-2 tablets (50-100 mg total) by mouth at bedtime as needed for sleep., Disp: 60 tablet, Rfl: 3   Turmeric (QC TUMERIC COMPLEX) 500 MG CAPS, Take 2 tablets by mouth daily., Disp: , Rfl:   Vitals   Vitals:   10/04/23 0045 10/04/23 0116 10/04/23 0509 10/04/23 0800  BP: (!) 144/86  (!) 120/58 132/81  Pulse: 79  67 64  Resp: 16  17 18   Temp:  (!) 97.4 F (36.3 C) 97.8 F (36.6 C) 97.8 F (36.6 C)  TempSrc:  Oral Oral Oral  SpO2: 97%  98% 99%    There is no height or weight on file to calculate BMI.  Physical Exam   Constitutional: Appears well-developed and well-nourished.  Psych: Affect appropriate to situation.  Eyes: No scleral injection.  HENT: No OP obstruction.  Head: Normocephalic.  Cardiovascular: Normal rate and regular rhythm.  Respiratory: Effort normal, non-labored breathing.  GI: Soft.  No distension. There is no tenderness.  Skin: WDI.   Neurologic Examination   Neuro: Mental Status: Patient is awake, alert, oriented to person, place, month, year, and situation. Patient is able to give a clear and coherent history. No signs of aphasia or neglect Cranial Nerves: II: Visual Fields are full. Pupils are equal, round, and reactive to light.   III,IV, VI: EOMI without ptosis or diploplia.  V: Facial sensation is symmetric to temperature VII: Facial movement is symmetric.  VIII: hearing is intact to voice X: Uvula elevates symmetrically XI: Shoulder shrug is symmetric. XII: tongue is midline without atrophy or fasciculations.  Motor: Tone is normal. Bulk is normal. 5/5 strength was present in all four extremities.  Sensory: Sensation is symmetric to light touch and temperature in the arms and legs. Cerebellar: FNF and HKS are intact bilaterally   Labs/Imaging/Neurodiagnostic studies   CBC:  Recent Labs  Lab 11/02/23 1417 02-Nov-2023 1421  WBC  10.5  --   NEUTROABS 6.9  --   HGB 13.8 14.3  HCT 40.6 42.0  MCV 98.5  --   PLT 418*  --    Basic Metabolic Panel:  Lab Results  Component Value Date   NA 139 2023-11-02   K 4.7 02-Nov-2023   CO2 26 Nov 02, 2023   GLUCOSE 95 02-Nov-2023   BUN 24 (H) November 02, 2023   CREATININE 0.90 2023-11-02   CALCIUM 9.7 November 02, 2023   GFRNONAA >60 2023/11/02   GFRAA >60 06/07/2020   Lipid Panel:  Lab Results  Component Value Date   LDLCALC 72 10/04/2023   HgbA1c:  Lab Results  Component Value Date   HGBA1C 5.4 11/02/2023   Urine Drug Screen:     Component Value Date/Time   LABOPIA NONE  DETECTED 10/03/2023 1541   COCAINSCRNUR NONE DETECTED 10/03/2023 1541   LABBENZ NONE DETECTED 10/03/2023 1541   AMPHETMU NONE DETECTED 10/03/2023 1541   THCU NONE DETECTED 10/03/2023 1541   LABBARB NONE DETECTED 10/03/2023 1541    Alcohol Level     Component Value Date/Time   ETH <10 10/03/2023 1417   INR  Lab Results  Component Value Date   INR 1.0 10/03/2023   APTT  Lab Results  Component Value Date   APTT 26 10/03/2023   AED levels: No results found for: "PHENYTOIN", "ZONISAMIDE", "LAMOTRIGINE", "LEVETIRACETA"  CT Head without contrast(Personally reviewed): - No acute intracranial process.   CT angio Head and Neck with contrast(Personally reviewed): - No emergent large vessel occlusion or high-grade stenosis of the intracranial arteries.  MRI Brain(Personally reviewed): - Small acute left cerebellar infarct. - Chronic bilateral cerebellar infarcts. - Mild chronic small vessel ischemic disease in the cerebral white matter.  ASSESSMENT   SHARIKA FARAONE is a 48 y.o. female with PMH significant for prior stroke (2016), HTN, HLD, Anxiety/Depression who presented to [facility] 1/17 with altered mental status and confusion, with short-term memoty loss also noted.   MRI revealed small acute left cerebellar infarct and chronic bilateral cerebellar infarcts. CTA negative for LVO. Stroke workup has been  ordered. Pending transfer to [facility].   Patient previously on aspirin after her previous stroke but was taken off, no allergic reactions or bad side effects noted. She said previous doctors told her aspirin was bad on her kidneys. She has been on Hydrea for one year.   Impression: Acute Ischemic Left Cerebellar Infarct, likely due to small vessel disease  RECOMMENDATIONS  - Frequent Neuro checks per stroke unit protocol - MRI Brain stroke protocol (completed) - Vascular imaging - CT Angio head and neck (completed) - TTE (Pending results) - Lipid panel (LDL:72) ` - Statin - Increase Lipitor to 40 mg - A1C (5.4) - Antithrombotic - Recommend Aspirin and Plavix for 3 weeks, then plavix alone due to past issues with aspirin.  Continue Home Hydrea. Discussed with Stroke Team.  - DVT ppx - lovenox - SBP goal - <220 for 24-48 after symptom onset, then gradually normalize. PRN labetalol if HR>60 and PRN Hydralazine if HR<60  Avoid hypotension - Telemetry monitoring for arrhythmia - 72h - Swallow screen - will be performed prior to PO intake - Stroke education - will be given - PT/OT/SLP - Dispo: admit for stroke workup  ______________________________________________________________________  Pt seen by Neuro NP/APP and later by MD. Note/plan to be edited by MD as needed.    Lynnae January, DNP, AGACNP-BC [facility] Please use AMION for contact information & EPIC for messaging.  I have seen the patient and reviewed the above note.  She has a small cerebellar infarct, however the symptoms that she describes including repetitive questioning despite otherwise apparently clear sensorium, inability to remember people after they walked out of the room for a few minutes, etc. 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  I do wonder if the MRI was performed for transient global amnesia and found an incidental stroke, though the etiology of TGA is a little bit up in the air and both  the cerebellum and hippocampus are posterior circulation.  In any case, it would not change management and I would favor continuing stroke workup as above.  With her young age and the possibility of an embolic stroke, I do wonder if TEE/cardiac monitoring would be prudent, but will defer to  the stroke team in the morning.  Ritta Slot, MD [facility] [phone]  If 7pm- 7am, please page neurology on call as listed in AMION.

## 2023-10-04 NOTE — Progress Notes (Signed)
   10/04/23 1700  Vitals  Temp 98 F (36.7 C)  Temp Source Oral  BP (!) 134/91  MAP (mmHg) 104  BP Location Left Arm  BP Method Automatic  Level of Consciousness  Level of Consciousness Alert  MEWS COLOR  MEWS Score Color Green  Oxygen Therapy  SpO2 98 %  O2 Device Room Air  MEWS Score  MEWS Temp 0  MEWS Systolic 0  MEWS Pulse 0  MEWS RR 0  MEWS LOC 0  MEWS Score 0   New admit from [facility] ED

## 2023-10-04 NOTE — Progress Notes (Signed)
Speech Language Pathology Treatment:    Patient Details Name: Alicia Robinson MRN: [id number] DOB: 11/19/1975 Today's Date: 10/04/2023 Time:  -     Pt is oriented and reports being at her baseline per OT. OT did not notice any deficits in speech-language-cognition. Pt said she can stay with her mother per recommendations from OT due to slight unsteadiness (may be her baseline). Full assessment not needed.     Royce Macadamia  10/04/2023, 1:51 PM

## 2023-10-05 ENCOUNTER — Observation Stay (HOSPITAL_COMMUNITY): Payer: BC Managed Care – PPO

## 2023-10-05 DIAGNOSIS — D473 Essential (hemorrhagic) thrombocythemia: Secondary | ICD-10-CM | POA: Diagnosis not present

## 2023-10-05 DIAGNOSIS — I639 Cerebral infarction, unspecified: Secondary | ICD-10-CM

## 2023-10-05 DIAGNOSIS — I6389 Other cerebral infarction: Secondary | ICD-10-CM | POA: Diagnosis not present

## 2023-10-05 DIAGNOSIS — I63342 Cerebral infarction due to thrombosis of left cerebellar artery: Secondary | ICD-10-CM | POA: Diagnosis not present

## 2023-10-05 DIAGNOSIS — R4182 Altered mental status, unspecified: Secondary | ICD-10-CM | POA: Diagnosis not present

## 2023-10-05 LAB — ANTITHROMBIN III: AntiThromb III Func: 103 % (ref 75–120)

## 2023-10-05 MED ORDER — ASPIRIN 81 MG PO TBEC
81.0000 mg | DELAYED_RELEASE_TABLET | Freq: Every day | ORAL | 12 refills | Status: DC
  Filled 2023-10-05 – 2023-10-07 (×2): qty 30, 30d supply, fill #0

## 2023-10-05 MED ORDER — ATORVASTATIN CALCIUM 40 MG PO TABS
40.0000 mg | ORAL_TABLET | Freq: Every day | ORAL | 1 refills | Status: DC
  Filled 2023-10-05 – 2023-10-07 (×2): qty 30, 30d supply, fill #0
  Filled 2023-11-03: qty 30, 30d supply, fill #1

## 2023-10-05 MED ORDER — CLOPIDOGREL BISULFATE 75 MG PO TABS
75.0000 mg | ORAL_TABLET | Freq: Every day | ORAL | 0 refills | Status: DC
  Filled 2023-10-05 – 2023-10-07 (×2): qty 20, 20d supply, fill #0

## 2023-10-05 NOTE — Progress Notes (Signed)
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  She has been on Hydrea due to her previous history of stroke in 2016.  She has been compliant.  She had a episode of syncope and confusion 2 days ago, and was admitted for stroke.  She has recovered fully.    Objective:  Vitals:   10/05/23 0405 10/05/23 0801  BP: 110/77 134/82  Pulse: 69 68  Resp: 17 17  Temp: 97.9 F (36.6 C) 98 F (36.7 C)  SpO2: 99% 97%    Body mass index is 30.13 kg/m.  Intake/Output Summary (Last 24 hours) at 10/05/2023 1513 Last data filed at 10/04/2023 2100 Gross per 24 hour  Intake 120 ml  Output --  Net 120 ml     Sclerae unicteric  Oropharynx clear  No peripheral adenopathy  Lungs clear -- no rales or rhonchi  Heart regular rate and rhythm  Abdomen benign  MSK no focal spinal tenderness, no peripheral edema  Neuro nonfocal   CBG (last 3)  Recent Labs    10/03/23 1347  GLUCAP 109*     Labs:  Lab Results  Component Value Date   WBC 10.5 10/03/2023   HGB 14.3 10/03/2023   HCT 42.0 10/03/2023   MCV 98.5 10/03/2023   PLT 418 (H) 10/03/2023   NEUTROABS 6.9 10/03/2023     Urine Studies No results for input(s): "UHGB", "CRYS" in the last 72 hours.  Invalid input(s): "UACOL", "UAPR", "USPG", "UPH", "UTP", "UGL", "UKET", "UBIL", "UNIT", "UROB", "ULEU", "UEPI", "UWBC", "URBC", "UBAC", "CAST", "UCOM", "BILUA"  Basic Metabolic Panel: Recent Labs  Lab 10/03/23 1417 10/03/23 1421  NA 139 139  K 4.3 4.7  CL 104 104  CO2 26  --   GLUCOSE 100* 95  BUN 20 24*  CREATININE 0.68 0.90  CALCIUM 9.7  --    GFR Estimated Creatinine Clearance: 94.5 mL/min (by C-G formula based on SCr of 0.9 mg/dL). Liver Function Tests: Recent Labs  Lab 10/03/23 1417  AST 37  ALT 52*  ALKPHOS 51  BILITOT 0.6  PROT 7.6  ALBUMIN 4.6   No results for input(s): "LIPASE", "AMYLASE" in the  last 168 hours. No results for input(s): "AMMONIA" in the last 168 hours. Coagulation profile Recent Labs  Lab 10/03/23 1417  INR 1.0    CBC: Recent Labs  Lab 10/03/23 1417 10/03/23 1421  WBC 10.5  --   NEUTROABS 6.9  --   HGB 13.8 14.3  HCT 40.6 42.0  MCV 98.5  --   PLT 418*  --    Cardiac Enzymes: No results for input(s): "CKTOTAL", "CKMB", "CKMBINDEX", "TROPONINI" in the last 168 hours. BNP: Invalid input(s): "POCBNP" CBG: Recent Labs  Lab 10/03/23 1347  GLUCAP 109*   D-Dimer No results for input(s): "DDIMER" in the last 72 hours. Hgb A1c Recent Labs    10/03/23 2048  HGBA1C 5.4   Lipid Profile Recent Labs    10/04/23 0508  CHOL 144  HDL 59  LDLCALC 72  TRIG 64  CHOLHDL 2.4   Thyroid function studies No results for input(s): "TSH", "T4TOTAL", "T3FREE", "THYROIDAB" in the last 72 hours.  Invalid input(s): "FREET3" Anemia work up No results for input(s): "VITAMINB12", "FOLATE", "FERRITIN", "TIBC", "IRON", "RETICCTPCT" in the last 72 hours. Microbiology No results found for this or any previous visit (from the past 240 hours).    Studies:  EEG adult Result Date: 10/05/2023 Charlsie Quest, MD     10/05/2023  8:46 AM Patient Name: Alicia Robinson MRN: [id number] Epilepsy Attending: Charlsie Quest Referring Physician/Provider: Rejeana Brock, MD Date: 10/04/2023 Duration: 36.02 mins Patient history: 48 y.o. female with PMH significant for prior stroke (2016), HTN, HLD, Anxiety/Depression who presented to [facility] 1/17 with altered mental status and confusion, with short-term memoty loss also noted. EEG to evaluate for seizure Level of alertness: Awake, asleep AEDs during EEG study: None Technical aspects: This EEG study was done with scalp electrodes positioned according to the 10-20 International system of electrode placement. Electrical activity was reviewed with band pass filter of 1-70Hz , sensitivity of 7 uV/mm, display speed of 49mm/sec with a 60Hz   notched filter applied as appropriate. EEG data were recorded continuously and digitally stored.  Video monitoring was available and reviewed as appropriate. Description: The posterior dominant rhythm consists of 8 Hz activity of moderate voltage (25-35 uV) seen predominantly in posterior head regions, symmetric and reactive to eye opening and eye closing. Sleep was characterized by vertex waves, sleep spindles (12 to 14 Hz), maximal frontocentral region. EEG showed intermittent generalized 5 to 7 Hz theta slowing. Hyperventilation and photic stimulation were not performed.   ABNORMALITY - Intermittent slow, generalized IMPRESSION: This study is suggestive of mild diffuse encephalopathy. No seizures or epileptiform discharges were seen throughout the recording. Charlsie Quest   ECHOCARDIOGRAM COMPLETE Result Date: 10/04/2023    ECHOCARDIOGRAM REPORT   Patient Name:   Alicia Robinson Date of Exam: 10/04/2023 Medical Rec #:  [id number]     Height:       70.0 in Accession #:    [id number]    Weight:       210.0 lb Date of Birth:  [birth year]    BSA:          2.131 m Patient Age:    47 years      BP:           132/81 mmHg Patient Gender: F             HR:           66 bpm. Exam Location:  Inpatient Procedure: 2D Echo, Color Doppler and Cardiac Doppler Indications:    Stroke i63.9  History:        Patient has no prior history of Echocardiogram examinations.                 Risk Factors:Hypertension and Dyslipidemia.  Sonographer:    Irving Burton Senior RDCS Referring Phys: [id number] SAIMA RIZWAN IMPRESSIONS  1. Left ventricular ejection fraction, by estimation, is 60 to 65%. The left ventricle has normal function. The left ventricle has no regional wall motion abnormalities. Left ventricular diastolic parameters were normal.  2. Right ventricular systolic function is normal. The right ventricular size is normal. There is normal pulmonary artery systolic pressure. The estimated right ventricular systolic pressure is 21.3 mmHg.  3.  The mitral valve is normal in structure. Mild mitral valve regurgitation. No evidence of mitral stenosis.  4. The aortic valve is tricuspid. Aortic valve regurgitation is not visualized. No aortic stenosis is present.  5. The inferior vena cava is normal in size with greater than 50% respiratory variability, suggesting right atrial pressure of 3 mmHg. Comparison(s): No prior Echocardiogram. FINDINGS  Left Ventricle: Left ventricular ejection fraction, by estimation, is 60 to 65%. The left ventricle has normal function. The left ventricle has no regional wall motion  abnormalities. The left ventricular internal cavity size was normal in size. There is  no left ventricular hypertrophy. Left ventricular diastolic parameters were normal. Right Ventricle: The right ventricular size is normal. Right ventricular systolic function is normal. There is normal pulmonary artery systolic pressure. The tricuspid regurgitant velocity is 2.14 m/s, and with an assumed right atrial pressure of 3 mmHg,  the estimated right ventricular systolic pressure is 21.3 mmHg. Left Atrium: Left atrial size was normal in size. Right Atrium: Right atrial size was normal in size. Pericardium: There is no evidence of pericardial effusion. Mitral Valve: The mitral valve is normal in structure. Mild mitral valve regurgitation. No evidence of mitral valve stenosis. Tricuspid Valve: The tricuspid valve is normal in structure. Tricuspid valve regurgitation is mild . No evidence of tricuspid stenosis. Aortic Valve: The aortic valve is tricuspid. Aortic valve regurgitation is not visualized. No aortic stenosis is present. Pulmonic Valve: The pulmonic valve was normal in structure. Pulmonic valve regurgitation is trivial. No evidence of pulmonic stenosis. Aorta: The aortic root and ascending aorta are structurally normal, with no evidence of dilitation. Venous: The inferior vena cava is normal in size with greater than 50% respiratory variability, suggesting  right atrial pressure of 3 mmHg. IAS/Shunts: No atrial level shunt detected by color flow Doppler.  LEFT VENTRICLE PLAX 2D LVIDd:         4.70 cm   Diastology LVIDs:         2.90 cm   LV e' medial:    7.94 cm/s LV PW:         1.00 cm   LV E/e' medial:  8.7 LV IVS:        0.80 cm   LV e' lateral:   8.16 cm/s LVOT diam:     2.10 cm   LV E/e' lateral: 8.5 LV SV:         81 LV SV Index:   38 LVOT Area:     3.46 cm  RIGHT VENTRICLE RV S prime:     10.30 cm/s TAPSE (M-mode): 2.0 cm LEFT ATRIUM             Index        RIGHT ATRIUM           Index LA diam:        3.70 cm 1.74 cm/m   RA Area:     19.60 cm LA Vol (A2C):   55.0 ml 25.81 ml/m  RA Volume:   55.90 ml  26.23 ml/m LA Vol (A4C):   40.3 ml 18.91 ml/m LA Biplane Vol: 47.6 ml 22.34 ml/m  AORTIC VALVE LVOT Vmax:   111.00 cm/s LVOT Vmean:  82.600 cm/s LVOT VTI:    0.235 m  AORTA Ao Root diam: 3.50 cm Ao Asc diam:  3.30 cm MITRAL VALVE               TRICUSPID VALVE MV Area (PHT): 1.96 cm    TR Peak grad:   18.3 mmHg MV Decel Time: 388 msec    TR Vmax:        214.00 cm/s MV E velocity: 69.20 cm/s MV A velocity: 63.40 cm/s  SHUNTS MV E/A ratio:  1.09        Systemic VTI:  0.24 m                            Systemic Diam: 2.10 cm Olga Millers MD Electronically signed  by Olga Millers MD Signature Date/Time: 10/04/2023/1:43:56 PM    Final    CT ANGIO HEAD NECK W WO CM Result Date: 10/03/2023 CLINICAL DATA:  Slurred speech and memory loss EXAM: CT ANGIOGRAPHY HEAD AND NECK WITH AND WITHOUT CONTRAST TECHNIQUE: Multidetector CT imaging of the head and neck was performed using the standard protocol during bolus administration of intravenous contrast. Multiplanar CT image reconstructions and MIPs were obtained to evaluate the vascular anatomy. Carotid stenosis measurements (when applicable) are obtained utilizing NASCET criteria, using the distal internal carotid diameter as the denominator. RADIATION DOSE REDUCTION: This exam was performed according to the  departmental dose-optimization program which includes automated exposure control, adjustment of the mA and/or kV according to patient size and/or use of iterative reconstruction technique. CONTRAST:  OMNIPAQUE IOHEXOL 350 MG/ML SOLN COMPARISON:  None Available. FINDINGS: CTA NECK FINDINGS Skeleton: No acute abnormality or high grade bony spinal canal stenosis. Other neck: Normal pharynx, larynx and major salivary glands. No cervical lymphadenopathy. Unremarkable thyroid gland. Upper chest: No pneumothorax or pleural effusion. No nodules or masses. Aortic arch: There is no calcific atherosclerosis of the aortic arch. Conventional 3 vessel aortic branching pattern. RIGHT carotid system: Normal without aneurysm, dissection or stenosis. LEFT carotid system: Normal without aneurysm, dissection or stenosis. Vertebral arteries: Codominant configuration. There is no dissection, occlusion or flow-limiting stenosis to the skull base (V1-V3 segments). CTA HEAD FINDINGS POSTERIOR CIRCULATION: Vertebral arteries are normal. No proximal occlusion of the anterior or inferior cerebellar arteries. Basilar artery is normal. Superior cerebellar arteries are normal. Posterior cerebral arteries are normal. ANTERIOR CIRCULATION: Intracranial internal carotid arteries are normal. Anterior cerebral arteries are normal. Middle cerebral arteries are normal. Venous sinuses: As permitted by contrast timing, patent. Anatomic variants: None Review of the MIP images confirms the above findings. IMPRESSION: No emergent large vessel occlusion or high-grade stenosis of the intracranial arteries. Electronically Signed   By: Deatra Robinson M.D.   On: 10/03/2023 21:48   MR BRAIN WO CONTRAST Result Date: 10/03/2023 CLINICAL DATA:  Neuro deficit, acute, stroke suspected. Slurred speech. Memory loss. EXAM: MRI HEAD WITHOUT CONTRAST TECHNIQUE: Multiplanar, multiecho pulse sequences of the brain and surrounding structures were obtained without  intravenous contrast. COMPARISON:  Head CT 10/03/2023 FINDINGS: Brain: There is an 8 mm acute infarct peripherally in the posterosuperior left cerebellar hemisphere. Multiple small chronic cerebellar infarcts are present bilaterally. Scattered small T2 hyperintensities in the cerebral white matter bilaterally are nonspecific but compatible with mild chronic small vessel ischemic disease. The ventricles are normal in size. No intracranial hemorrhage, mass, midline shift, or extra-axial fluid collection is identified. Vascular: Major intracranial vascular flow voids are preserved. Skull and upper cervical spine: Unremarkable bone marrow signal. Sinuses/Orbits: Unremarkable orbits. Right maxillary sinus mucous retention cyst. Trace right mastoid fluid. Other: None. IMPRESSION: 1. Small acute left cerebellar infarct. 2. Chronic bilateral cerebellar infarcts. 3. Mild chronic small vessel ischemic disease in the cerebral white matter. Electronically Signed   By: Sebastian Ache M.D.   On: 10/03/2023 17:01   CT HEAD WO CONTRAST Result Date: 10/03/2023 CLINICAL DATA:  Transient slurred speech and memory loss, concern for TIA EXAM: CT HEAD WITHOUT CONTRAST TECHNIQUE: Contiguous axial images were obtained from the base of the skull through the vertex without intravenous contrast. RADIATION DOSE REDUCTION: This exam was performed according to the departmental dose-optimization program which includes automated exposure control, adjustment of the mA and/or kV according to patient size and/or use of iterative reconstruction technique. COMPARISON:  11/24/2022 FINDINGS: Brain: No  evidence of acute infarction, hemorrhage, mass, mass effect, or midline shift. No hydrocephalus or extra-axial fluid collection. Vascular: No hyperdense vessel. Skull: Negative for fracture or focal lesion. Sinuses/Orbits: No acute finding. Other: The mastoid air cells are well aerated. IMPRESSION: No acute intracranial process. Electronically Signed   By:  Wiliam Ke M.D.   On: 10/03/2023 16:01    Assessment: 48 y.o. female   Small acute left cerebellar infarct Syncope and confusion, resolved Essential thrombocythemia, on Hydrea    Plan:  -Her thrombocytosis has been well-controlled with Hydrea, platelets was 418 when she was admitted.  I doubt her stroke is related to treated the ET -Agree with antiplatelet per neurology -I did not think she needs higher dose of Hydrea for now, but will continue monitoring her CBC. -I think antiphospholipid syndrome needs to be ruled out given her 2 episodes of stroke in age, lab work for antiphospholipid syndrome has been placed by primary team. -I will f/u as needed in hospital, and see her back in a month after discharge   Malachy Mood, MD 10/05/2023  3:13 PM

## 2023-10-05 NOTE — Procedures (Signed)
Patient Name: Alicia Robinson  MRN: [id number]  Epilepsy Attending: Charlsie Quest  Referring Physician/Provider: Rejeana Brock, MD  Date: 10/04/2023 Duration: 36.02 mins  Patient history: 48 y.o. female with PMH significant for prior stroke (2016), HTN, HLD, Anxiety/Depression who presented to [facility] 1/17 with altered mental status and confusion, with short-term memoty loss also noted. EEG to evaluate for seizure  Level of alertness: Awake, asleep  AEDs during EEG study: None  Technical aspects: This EEG study was done with scalp electrodes positioned according to the 10-20 International system of electrode placement. Electrical activity was reviewed with band pass filter of 1-70Hz , sensitivity of 7 uV/mm, display speed of 13mm/sec with a 60Hz  notched filter applied as appropriate. EEG data were recorded continuously and digitally stored.  Video monitoring was available and reviewed as appropriate.  Description: The posterior dominant rhythm consists of 8 Hz activity of moderate voltage (25-35 uV) seen predominantly in posterior head regions, symmetric and reactive to eye opening and eye closing. Sleep was characterized by vertex waves, sleep spindles (12 to 14 Hz), maximal frontocentral region. EEG showed intermittent generalized 5 to 7 Hz theta slowing. Hyperventilation and photic stimulation were not performed.     ABNORMALITY - Intermittent slow, generalized  IMPRESSION: This study is suggestive of mild diffuse encephalopathy. No seizures or epileptiform discharges were seen throughout the recording.  [name] Annabelle Harman

## 2023-10-05 NOTE — Progress Notes (Addendum)
STROKE TEAM PROGRESS NOTE   BRIEF HPI [name] is a 48 y.o. 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  Presenting symptoms are suspicious for TGA.  MRI brain revealed small acute left cerebellar infarct as well as chronic bilateral cerebellar infarcts.  Patient states that when she had her previous stroke, she used alcohol and other drugs.  She has stopped doing these things and states that she exercises regularly and lives a healthier lifestyle now.  Given multiple strokes in a young patient, will obtain hypercoagulable workup, TCD bubble study and send patient home with 30-day cardiac monitor.  NIH on Admission 0  INTERIM HISTORY/SUBJECTIVE Patient has remained hemodynamically stable and afebrile.  Her altered mental status has improved, but some short-term memory difficulty still persist on exam.   OBJECTIVE  CBC    Component Value Date/Time   WBC 10.5 10/03/2023 1417   RBC 4.12 10/03/2023 1417   HGB 14.3 10/03/2023 1421   HGB 13.7 09/24/2023 1253   HGB 13.1 03/11/2023 1108   HCT 42.0 10/03/2023 1421   HCT 39.4 03/11/2023 1108   PLT 418 (H) 10/03/2023 1417   PLT 412 (H) 09/24/2023 1253   PLT 461 (H) 03/11/2023 1108   MCV 98.5 10/03/2023 1417   MCV 95 03/11/2023 1108   MCH 33.5 10/03/2023 1417   MCHC 34.0 10/03/2023 1417   RDW 13.2 10/03/2023 1417   RDW 13.8 03/11/2023 1108   LYMPHSABS 2.6 10/03/2023 1417   LYMPHSABS 2.5 03/11/2023 1108   MONOABS 0.7 10/03/2023 1417   EOSABS 0.1 10/03/2023 1417   EOSABS 0.3 03/11/2023 1108   BASOSABS 0.1 10/03/2023 1417   BASOSABS 0.1 03/11/2023 1108    BMET    Component Value Date/Time   NA 139 10/03/2023 1421   NA 140 03/11/2023 1108   K 4.7 10/03/2023 1421   CL 104 10/03/2023 1421   CO2 26 10/03/2023 1417   GLUCOSE 95 10/03/2023 1421   BUN 24 (H)  10/03/2023 1421   BUN 19 03/11/2023 1108   CREATININE 0.90 10/03/2023 1421   CREATININE 0.98 09/24/2023 1253   CALCIUM 9.7 10/03/2023 1417   EGFR 63 03/11/2023 1108   GFRNONAA >60 10/03/2023 1417   GFRNONAA >60 09/24/2023 1253    IMAGING past 24 hours EEG adult Result Date: 10/05/2023 Charlsie Quest, MD     10/05/2023  8:46 AM Patient Name: Alicia Robinson MRN: [id number] Epilepsy Attending: Charlsie Quest Referring Physician/Provider: Rejeana Brock, MD Date: 10/04/2023 Duration: 36.02 mins Patient history: 48 y.o. female with PMH significant for prior stroke (2016), HTN, HLD, Anxiety/Depression who presented to [facility] 1/17 with altered mental status and confusion, with short-term memoty loss also noted. EEG to evaluate for seizure Level of alertness: Awake, asleep AEDs during EEG study: None Technical aspects: This EEG study was done with scalp electrodes positioned according to the 10-20 International system of electrode placement. Electrical activity was reviewed with band pass filter of 1-70Hz , sensitivity of 7 uV/mm, display speed of 62mm/sec with a 60Hz  notched filter applied as appropriate. EEG data were recorded continuously and digitally stored.  Video monitoring was available and reviewed as appropriate. Description: The posterior dominant rhythm consists of 8 Hz activity of moderate voltage (25-35 uV) seen predominantly in posterior head regions, symmetric and reactive to eye opening and eye closing. Sleep was characterized by vertex waves, sleep spindles (12 to  14 Hz), maximal frontocentral region. EEG showed intermittent generalized 5 to 7 Hz theta slowing. Hyperventilation and photic stimulation were not performed.   ABNORMALITY - Intermittent slow, generalized IMPRESSION: This study is suggestive of mild diffuse encephalopathy. No seizures or epileptiform discharges were seen throughout the recording. Priyanka Annabelle Harman    Vitals:   10/04/23 2001 10/05/23 0017 10/05/23 0405 10/05/23  0801  BP: 134/88 130/81 110/77 134/82  Pulse: 71 69 69 68  Resp: 16 18 17 17   Temp: 98.3 F (36.8 C) 97.9 F (36.6 C) 97.9 F (36.6 C) 98 F (36.7 C)  TempSrc: Oral Oral Oral Oral  SpO2: 99% 96% 99% 97%  Weight:      Height:         PHYSICAL EXAM General:  Alert, well-nourished, well-developed patient in no acute distress Psych:  Mood and affect appropriate for situation CV: Regular rate and rhythm on monitor Respiratory:  Regular, unlabored respirations on room air   NEURO:  Mental Status: AA&Ox3, patient is able to give clear and coherent history of present illness.  3 out of 3 rate registration, 1 out of 3 recall, able to name 12 animals with 4 feet when asked Speech/Language: speech is without dysarthria or aphasia.    Cranial Nerves:  II: PERRL. Visual fields full.  III, IV, VI: EOMI. Eyelids elevate symmetrically.  V: Sensation is intact to light touch and symmetrical to face.  VII: Face is symmetrical resting and smiling VIII: hearing intact to voice. IX, X: Phonation is normal.  ZO:[id number] shrug 5/5. XII: tongue is midline without fasciculations. Motor: 5/5 strength to all muscle groups tested.  Tone: is normal and bulk is normal Sensation- Intact to light touch bilaterally.   Coordination: FTN intact bilaterally. Gait- deferred  Most Recent NIH 0   ASSESSMENT/PLAN  Acute Ischemic Infarct:  left cerebellar infarct, as well as suspicion for TGA Etiology: Likely small vessel disease CT head No acute abnormality.  CTA head & neck LVO or hemodynamically significant stenosis MRI small acute left cerebellar infarct, chronic bilateral cerebellar infarcts and mild chronic small vessel ischemic disease 2D Echo EF 60 to 65%, normal left atrial size, no atrial level shunt TCD bubble study no DVT EEG negative for seizure Hypercoagulable panel pending Recommend 30-day cardiac monitor on discharge LDL 72 HgbA1c 5.4 VTE prophylaxis -SCDs No antithrombotic prior  to admission, now on aspirin 81 mg daily and clopidogrel 75 mg daily for 3 weeks and then aspirin alone. Therapy recommendations: none Disposition: home  Hx of Stroke/TIA Per pt, she has a history of a stroke in 2017, which she attributes to unhealthy lifestyle choices (smoking and heavy alcohol, abstain since).  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 Cortney E Ernestina Columbia , MSN, AGACNP-BC [facility] See Amion for schedule and pager information 10/05/2023 3:20 PM  ATTENDING NOTE: I reviewed above note and agree with the assessment  and plan. Pt was seen and examined.   No family is at the bedside. Pt is lying in bed, awake, alert, eyes open, orientated to age, place, time. No aphasia, fluent language, following all simple commands. Able to name and repeat. Delayed recall 1/3 with cues. No gaze palsy, tracking bilaterally, visual field full, PERRL. No facial droop. Tongue midline. Bilateral UEs 5/5, no drift. Bilaterally LEs 5/5, no drift. Sensation symmetrical bilaterally, b/l FTN intact, gait not tested.  Pt stated that she had episode of confusion, memory loss, not knowing where she was, what she was doing, etc. Had no recollection for short period of time. Clinically resembles TGA but MRI showed left cerebellar small infarct in the setting of chronic  b/l small cerebellar infarct. She had stroke in 2017 when she had slurry speech and left sided numbness, CT and MRI this time showed chronic b/l cerebellar small infarcts, right larger than left, which again not consistent with her symptoms at that time. Overall her strokes are more consistent with small vessel disease, but we did have further work up such as hypercoagulable labs pending, TCD bubble study no PFO, and recommend 30 day monitoring as outpt. Continue hydrea and also DAPT for 3 weeks and then ASA alone. Increased lipitor from 20 to 40.  Will follow up at [facility] in 4-6 weeks.   For detailed assessment and plan, please refer to above/below as I have made changes wherever appropriate.   Neurology will sign off. Please call with questions. Pt will follow up with stroke clinic NP at [facility] in about 4-6 weeks. Thanks for the consult.   Marvel Plan, MD PhD Stroke Neurology 10/05/2023 3:40 PM      To contact Stroke Continuity provider, please refer to [email or web address]. After hours, contact General Neurology

## 2023-10-05 NOTE — Progress Notes (Signed)
PROGRESS NOTE                                                                                                                                                                                                             Patient Demographics:    Alicia Robinson, is a 48 y.o. female, DOB - 06-03-1976, ZOX:[id number]  Outpatient Primary MD for the patient is [name], MD    LOS - 0  Admit date - 10/03/2023    Chief Complaint  Patient presents with   Memory Loss       Brief Narrative (HPI from H&P)   48 y.o. female with medical history of thrombocythemia, prior CVA who presents to the hospital with confusion.  Patient is still having short-term memory loss and therefore her daughter gives the history.  Apparently she noted around 10:50 today that the patient was confused.  She did not know where she was and was asking random questions.  In addition she noted that her speech was extremely slow.  This has steadily improved to where she is more oriented and no longer having speech difficulty but it seems that she is still having trouble remembering recent events.  For example she complains of a frontal headache and she cannot remember that she just received Tylenol and has been asking "everyone who comes in the room" for some Tylenol.    Subjective:    Alicia Robinson today has, No headache, No chest pain, No abdominal pain - No Nausea, No new weakness tingling or numbness, no SOB.   Assessment  & Plan :   Small acute left cerebellar infarct.  With some elements of transient global amnesia, she has prior infarcts in the cerebellum as well, short term memory issues resolved, f/u neuro w/u and recommendations, Stroke team to evaluate, currently on DAPT 3 weeks thereafter Plavix alone, on statin continue.  EEG unremarkable.  Echo stable.  CTA is stable.  Clinically improved.  Has underlying essential thrombocythemia for which hydroxyurea  will be continued, neurology has requested oncology evaluation which will be requested to make sure it is not contributing to CVA.   Rash - resolved   Essential thrombocythemia (HCC)  - cont Hydroxurea, Onco to see   Lab Results  Component Value Date   HGBA1C 5.4 10/03/2023   Lab Results  Component Value Date  CHOL 144 10/04/2023   HDL 59 10/04/2023   LDLCALC 72 10/04/2023   TRIG 64 10/04/2023   CHOLHDL 2.4 10/04/2023         Condition - Fair  Family Communication  :  None present  Code Status :  Full  Consults  :  Neuro, Onc  PUD Prophylaxis :    Procedures  :     MRI - 1. Small acute left cerebellar infarct. 2. Chronic bilateral cerebellar infarcts. 3. Mild chronic small vessel ischemic disease in the cerebral white matter.   TTE - 1. Left ventricular ejection fraction, by estimation, is 60 to 65%. The left ventricle has normal function. The left ventricle has no regional wall motion abnormalities. Left ventricular diastolic parameters were normal.  2. Right ventricular systolic function is normal. The right ventricular size is normal. There is normal pulmonary artery systolic pressure. The estimated right ventricular systolic pressure is 21.3 mmHg.  3. The mitral valve is normal in structure. Mild mitral valve regurgitation. No evidence of mitral stenosis.  4. The aortic valve is tricuspid. Aortic valve regurgitation is not visualized. No aortic stenosis is present.  5. The inferior vena cava is normal in size with greater than 50% respiratory variability, suggesting right atrial pressure of 3 mmHg.   CTA -  No emergent large vessel occlusion or high-grade stenosis of the intracranial arteries  EEG - Intermittent slow, generalized IMPRESSION: This study is suggestive of mild diffuse encephalopathy. No seizures or epileptiform discharges were seen throughout the recording.      Disposition Plan  :    Status is: Observation  DVT Prophylaxis  :    SCD's Start:  10/03/23 1809   Lab Results  Component Value Date   PLT 418 (H) 10/03/2023    Diet :  Diet Order             Diet regular Room service appropriate? Yes; Fluid consistency: Thin  Diet effective now                    Inpatient Medications  Scheduled Meds:  aspirin EC  81 mg Oral Daily   atorvastatin  40 mg Oral Daily   clopidogrel  75 mg Oral Daily   hydroxyurea  500 mg Oral Daily   Continuous Infusions: PRN Meds:.acetaminophen **OR** acetaminophen (TYLENOL) oral liquid 160 mg/5 mL **OR** acetaminophen, diphenhydrAMINE, senna-docusate  Antibiotics  :    Anti-infectives (From admission, onward)    None         Objective:   Vitals:   10/04/23 2001 10/05/23 0017 10/05/23 0405 10/05/23 0801  BP: 134/88 130/81 110/77 134/82  Pulse: 71 69 69 68  Resp: 16 18 17 17   Temp: 98.3 F (36.8 C) 97.9 F (36.6 C) 97.9 F (36.6 C) 98 F (36.7 C)  TempSrc: Oral Oral Oral Oral  SpO2: 99% 96% 99% 97%  Weight:      Height:        Wt Readings from Last 3 Encounters:  10/04/23 95.2 kg  09/24/23 95.3 kg  09/23/23 97.2 kg     Intake/Output Summary (Last 24 hours) at 10/05/2023 1020 Last data filed at 10/04/2023 2100 Gross per 24 hour  Intake 120 ml  Output --  Net 120 ml     Physical Exam  Awake Alert, No new F.N deficits, Normal affect [place].AT,PERRAL Supple Neck, No JVD,   Symmetrical Chest wall movement, Good air movement bilaterally, CTAB RRR,No Gallops,Rubs or new Murmurs,  +ve B.Sounds,  Abd Soft, No tenderness,   No Cyanosis, Clubbing or edema       Data Review:    Recent Labs  Lab 10/03/23 1417 10/03/23 1421  WBC 10.5  --   HGB 13.8 14.3  HCT 40.6 42.0  PLT 418*  --   MCV 98.5  --   MCH 33.5  --   MCHC 34.0  --   RDW 13.2  --   LYMPHSABS 2.6  --   MONOABS 0.7  --   EOSABS 0.1  --   BASOSABS 0.1  --     Recent Labs  Lab 10/03/23 1417 10/03/23 1421 10/03/23 2048  NA 139 139  --   K 4.3 4.7  --   CL 104 104  --   CO2 26  --   --    ANIONGAP 9  --   --   GLUCOSE 100* 95  --   BUN 20 24*  --   CREATININE 0.68 0.90  --   AST 37  --   --   ALT 52*  --   --   ALKPHOS 51  --   --   BILITOT 0.6  --   --   ALBUMIN 4.6  --   --   INR 1.0  --   --   HGBA1C  --   --  5.4  CALCIUM 9.7  --   --       Recent Labs  Lab 10/03/23 1417 10/03/23 2048  INR 1.0  --   HGBA1C  --  5.4  CALCIUM 9.7  --     --------------------------------------------------------------------------------------------------------------- Lab Results  Component Value Date   CHOL 144 10/04/2023   HDL 59 10/04/2023   LDLCALC 72 10/04/2023   TRIG 64 10/04/2023   CHOLHDL 2.4 10/04/2023    Lab Results  Component Value Date   HGBA1C 5.4 10/03/2023      Radiology Reports EEG adult Result Date: 10/05/2023 Charlsie Quest, MD     10/05/2023  8:46 AM Patient Name: MAZLYN RUSSIN MRN: [id number] Epilepsy Attending: Charlsie Quest Referring Physician/Provider: Rejeana Brock, MD Date: 10/04/2023 Duration: 36.02 mins Patient history: 48 y.o. female with PMH significant for prior stroke (2016), HTN, HLD, Anxiety/Depression who presented to [facility] 1/17 with altered mental status and confusion, with short-term memoty loss also noted. EEG to evaluate for seizure Level of alertness: Awake, asleep AEDs during EEG study: None Technical aspects: This EEG study was done with scalp electrodes positioned according to the 10-20 International system of electrode placement. Electrical activity was reviewed with band pass filter of 1-70Hz , sensitivity of 7 uV/mm, display speed of 46mm/sec with a 60Hz  notched filter applied as appropriate. EEG data were recorded continuously and digitally stored.  Video monitoring was available and reviewed as appropriate. Description: The posterior dominant rhythm consists of 8 Hz activity of moderate voltage (25-35 uV) seen predominantly in posterior head regions, symmetric and reactive to eye opening and eye closing. Sleep was  characterized by vertex waves, sleep spindles (12 to 14 Hz), maximal frontocentral region. EEG showed intermittent generalized 5 to 7 Hz theta slowing. Hyperventilation and photic stimulation were not performed.   ABNORMALITY - Intermittent slow, generalized IMPRESSION: This study is suggestive of mild diffuse encephalopathy. No seizures or epileptiform discharges were seen throughout the recording. Charlsie Quest   ECHOCARDIOGRAM COMPLETE Result Date: 10/04/2023    ECHOCARDIOGRAM REPORT   Patient Name:   TABRIA MUA Date of Exam: 10/04/2023 Medical Rec #:  [id number]     Height:       70.0 in Accession #:    [id number]    Weight:       210.0 lb Date of Birth:  1976-08-28    BSA:          2.131 m Patient Age:    47 years      BP:           132/81 mmHg Patient Gender: F             HR:           66 bpm. Exam Location:  Inpatient Procedure: 2D Echo, Color Doppler and Cardiac Doppler Indications:    Stroke i63.9  History:        Patient has no prior history of Echocardiogram examinations.                 Risk Factors:Hypertension and Dyslipidemia.  Sonographer:    Irving Burton Senior RDCS Referring Phys: [id number] SAIMA RIZWAN IMPRESSIONS  1. Left ventricular ejection fraction, by estimation, is 60 to 65%. The left ventricle has normal function. The left ventricle has no regional wall motion abnormalities. Left ventricular diastolic parameters were normal.  2. Right ventricular systolic function is normal. The right ventricular size is normal. There is normal pulmonary artery systolic pressure. The estimated right ventricular systolic pressure is 21.3 mmHg.  3. The mitral valve is normal in structure. Mild mitral valve regurgitation. No evidence of mitral stenosis.  4. The aortic valve is tricuspid. Aortic valve regurgitation is not visualized. No aortic stenosis is present.  5. The inferior vena cava is normal in size with greater than 50% respiratory variability, suggesting right atrial pressure of 3 mmHg. Comparison(s):  No prior Echocardiogram. FINDINGS  Left Ventricle: Left ventricular ejection fraction, by estimation, is 60 to 65%. The left ventricle has normal function. The left ventricle has no regional wall motion abnormalities. The left ventricular internal cavity size was normal in size. There is  no left ventricular hypertrophy. Left ventricular diastolic parameters were normal. Right Ventricle: The right ventricular size is normal. Right ventricular systolic function is normal. There is normal pulmonary artery systolic pressure. The tricuspid regurgitant velocity is 2.14 m/s, and with an assumed right atrial pressure of 3 mmHg,  the estimated right ventricular systolic pressure is 21.3 mmHg. Left Atrium: Left atrial size was normal in size. Right Atrium: Right atrial size was normal in size. Pericardium: There is no evidence of pericardial effusion. Mitral Valve: The mitral valve is normal in structure. Mild mitral valve regurgitation. No evidence of mitral valve stenosis. Tricuspid Valve: The tricuspid valve is normal in structure. Tricuspid valve regurgitation is mild . No evidence of tricuspid stenosis. Aortic Valve: The aortic valve is tricuspid. Aortic valve regurgitation is not visualized. No aortic stenosis is present. Pulmonic Valve: The pulmonic valve was normal in structure. Pulmonic valve regurgitation is trivial. No evidence of pulmonic stenosis. Aorta: The aortic root and ascending aorta are structurally normal, with no evidence of dilitation. Venous: The inferior vena cava is normal in size with greater than 50% respiratory variability, suggesting right atrial pressure of 3 mmHg. IAS/Shunts: No atrial level shunt detected by color flow Doppler.  LEFT VENTRICLE PLAX 2D LVIDd:         4.70 cm   Diastology LVIDs:         2.90 cm   LV e' medial:    7.94 cm/s LV PW:  1.00 cm   LV E/e' medial:  8.7 LV IVS:        0.80 cm   LV e' lateral:   8.16 cm/s LVOT diam:     2.10 cm   LV E/e' lateral: 8.5 LV SV:          81 LV SV Index:   38 LVOT Area:     3.46 cm  RIGHT VENTRICLE RV S prime:     10.30 cm/s TAPSE (M-mode): 2.0 cm LEFT ATRIUM             Index        RIGHT ATRIUM           Index LA diam:        3.70 cm 1.74 cm/m   RA Area:     19.60 cm LA Vol (A2C):   55.0 ml 25.81 ml/m  RA Volume:   55.90 ml  26.23 ml/m LA Vol (A4C):   40.3 ml 18.91 ml/m LA Biplane Vol: 47.6 ml 22.34 ml/m  AORTIC VALVE LVOT Vmax:   111.00 cm/s LVOT Vmean:  82.600 cm/s LVOT VTI:    0.235 m  AORTA Ao Root diam: 3.50 cm Ao Asc diam:  3.30 cm MITRAL VALVE               TRICUSPID VALVE MV Area (PHT): 1.96 cm    TR Peak grad:   18.3 mmHg MV Decel Time: 388 msec    TR Vmax:        214.00 cm/s MV E velocity: 69.20 cm/s MV A velocity: 63.40 cm/s  SHUNTS MV E/A ratio:  1.09        Systemic VTI:  0.24 m                            Systemic Diam: 2.10 cm Olga Millers MD Electronically signed by Olga Millers MD Signature Date/Time: 10/04/2023/1:43:56 PM    Final    CT ANGIO HEAD NECK W WO CM Result Date: 10/03/2023 CLINICAL DATA:  Slurred speech and memory loss EXAM: CT ANGIOGRAPHY HEAD AND NECK WITH AND WITHOUT CONTRAST TECHNIQUE: Multidetector CT imaging of the head and neck was performed using the standard protocol during bolus administration of intravenous contrast. Multiplanar CT image reconstructions and MIPs were obtained to evaluate the vascular anatomy. Carotid stenosis measurements (when applicable) are obtained utilizing NASCET criteria, using the distal internal carotid diameter as the denominator. RADIATION DOSE REDUCTION: This exam was performed according to the departmental dose-optimization program which includes automated exposure control, adjustment of the mA and/or kV according to patient size and/or use of iterative reconstruction technique. CONTRAST:  OMNIPAQUE IOHEXOL 350 MG/ML SOLN COMPARISON:  None Available. FINDINGS: CTA NECK FINDINGS Skeleton: No acute abnormality or high grade bony spinal canal stenosis. Other  neck: Normal pharynx, larynx and major salivary glands. No cervical lymphadenopathy. Unremarkable thyroid gland. Upper chest: No pneumothorax or pleural effusion. No nodules or masses. Aortic arch: There is no calcific atherosclerosis of the aortic arch. Conventional 3 vessel aortic branching pattern. RIGHT carotid system: Normal without aneurysm, dissection or stenosis. LEFT carotid system: Normal without aneurysm, dissection or stenosis. Vertebral arteries: Codominant configuration. There is no dissection, occlusion or flow-limiting stenosis to the skull base (V1-V3 segments). CTA HEAD FINDINGS POSTERIOR CIRCULATION: Vertebral arteries are normal. No proximal occlusion of the anterior or inferior cerebellar arteries. Basilar artery is normal. Superior cerebellar arteries are normal. Posterior cerebral arteries are normal. ANTERIOR  CIRCULATION: Intracranial internal carotid arteries are normal. Anterior cerebral arteries are normal. Middle cerebral arteries are normal. Venous sinuses: As permitted by contrast timing, patent. Anatomic variants: None Review of the MIP images confirms the above findings. IMPRESSION: No emergent large vessel occlusion or high-grade stenosis of the intracranial arteries. Electronically Signed   By: Deatra Robinson M.D.   On: 10/03/2023 21:48   MR BRAIN WO CONTRAST Result Date: 10/03/2023 CLINICAL DATA:  Neuro deficit, acute, stroke suspected. Slurred speech. Memory loss. EXAM: MRI HEAD WITHOUT CONTRAST TECHNIQUE: Multiplanar, multiecho pulse sequences of the brain and surrounding structures were obtained without intravenous contrast. COMPARISON:  Head CT 10/03/2023 FINDINGS: Brain: There is an 8 mm acute infarct peripherally in the posterosuperior left cerebellar hemisphere. Multiple small chronic cerebellar infarcts are present bilaterally. Scattered small T2 hyperintensities in the cerebral white matter bilaterally are nonspecific but compatible with mild chronic small vessel  ischemic disease. The ventricles are normal in size. No intracranial hemorrhage, mass, midline shift, or extra-axial fluid collection is identified. Vascular: Major intracranial vascular flow voids are preserved. Skull and upper cervical spine: Unremarkable bone marrow signal. Sinuses/Orbits: Unremarkable orbits. Right maxillary sinus mucous retention cyst. Trace right mastoid fluid. Other: None. IMPRESSION: 1. Small acute left cerebellar infarct. 2. Chronic bilateral cerebellar infarcts. 3. Mild chronic small vessel ischemic disease in the cerebral white matter. Electronically Signed   By: Sebastian Ache M.D.   On: 10/03/2023 17:01   CT HEAD WO CONTRAST Result Date: 10/03/2023 CLINICAL DATA:  Transient slurred speech and memory loss, concern for TIA EXAM: CT HEAD WITHOUT CONTRAST TECHNIQUE: Contiguous axial images were obtained from the base of the skull through the vertex without intravenous contrast. RADIATION DOSE REDUCTION: This exam was performed according to the departmental dose-optimization program which includes automated exposure control, adjustment of the mA and/or kV according to patient size and/or use of iterative reconstruction technique. COMPARISON:  11/24/2022 FINDINGS: Brain: No evidence of acute infarction, hemorrhage, mass, mass effect, or midline shift. No hydrocephalus or extra-axial fluid collection. Vascular: No hyperdense vessel. Skull: Negative for fracture or focal lesion. Sinuses/Orbits: No acute finding. Other: The mastoid air cells are well aerated. IMPRESSION: No acute intracranial process. Electronically Signed   By: Wiliam Ke M.D.   On: 10/03/2023 16:01      Signature  -   Susa Raring M.D on 10/05/2023 at 10:20 AM   -  To page go to [email or web address]

## 2023-10-05 NOTE — Progress Notes (Signed)
Mobility Specialist: Progress Note   10/05/23 1213  Mobility  Activity Ambulated independently in hallway  Level of Assistance Independent  Assistive Device None  Distance Ambulated (ft) 500 ft  Activity Response Tolerated well  Mobility Referral Yes  Mobility visit 1 Mobility  Mobility Specialist Start Time (ACUTE ONLY) 1056  Mobility Specialist Stop Time (ACUTE ONLY) 1101  Mobility Specialist Time Calculation (min) (ACUTE ONLY) 5 min    Received pt ambulating in room having no complaints and agreeable to mobility. Pt was asymptomatic throughout ambulation and returned to room w/o fault. Left ambulating in room w/ call bell in reach and all needs met.   Maurene Capes Mobility Specialist Please contact via SecureChat or Rehab office at [phone]

## 2023-10-05 NOTE — Discharge Instructions (Signed)
Follow with Primary MD [name], MD in 7 days   Get CBC, CMP,  -  checked next visit with your primary MD   Activity: As tolerated with Full fall precautions use walker/cane & assistance as needed  Disposition Home   Diet: Heart Healthy  Special Instructions: If you have smoked or chewed Tobacco  in the last 2 yrs please stop smoking, stop any regular Alcohol  and or any Recreational drug use.  On your next visit with your primary care physician please Get Medicines reviewed and adjusted.  Please request your Prim.MD to go over all Hospital Tests and Procedure/Radiological results at the follow up, please get all Hospital records sent to your Prim MD by signing hospital release before you go home.  If you experience worsening of your admission symptoms, develop shortness of breath, life threatening emergency, suicidal or homicidal thoughts you must seek medical attention immediately by calling 911 or calling your MD immediately  if symptoms less severe.  You Must read complete instructions/literature along with all the possible adverse reactions/side effects for all the Medicines you take and that have been prescribed to you. Take any new Medicines after you have completely understood and accpet all the possible adverse reactions/side effects.   Do not drive when taking Pain medications.  Do not take more than prescribed Pain, Sleep and Anxiety Medications

## 2023-10-05 NOTE — Plan of Care (Signed)
  Problem: Coping: Goal: Will verbalize positive feelings about self Outcome: Progressing   Problem: Health Behavior/Discharge Planning: Goal: Ability to manage health-related needs will improve Outcome: Progressing   Problem: Self-Care: Goal: Ability to participate in self-care as condition permits will improve Outcome: Progressing   Problem: Education: Goal: Knowledge of General Education information will improve Description: Including pain rating scale, medication(s)/side effects and non-pharmacologic comfort measures Outcome: Progressing

## 2023-10-05 NOTE — Discharge Summary (Signed)
CYNTIA BESSON NWG:[id number] DOB: [birth year] DOA: 10/03/2023  PCP: [name], MD  Admit date: 10/03/2023  Discharge date: 10/05/2023  Admitted From: Home   Disposition:  Home   Recommendations for Outpatient Follow-up:   Follow up with PCP in 1-2 weeks  PCP Please obtain BMP/CBC, 2 view CXR in 1week,  (see Discharge instructions)   PCP Please follow up on the following pending results:    Home Health: None   Equipment/Devices: None  Consultations: Neuro Discharge Condition: Stable    CODE STATUS: Full    Diet Recommendation: Heart Healthy     Chief Complaint  Patient presents with   Memory Loss     Brief history of present illness from the day of admission and additional interim summary    48 y.o. female with medical history of thrombocythemia, prior CVA who presents to the hospital with confusion.  Patient is still having short-term memory loss and therefore her daughter gives the history.  Apparently she noted around 10:50 today that the patient was confused.  She did not know where she was and was asking random questions.  In addition she noted that her speech was extremely slow.  This has steadily improved to where she is more oriented and no longer having speech difficulty but it seems that she is still having trouble remembering recent events.  For example she complains of a frontal headache and she cannot remember that she just received Tylenol and has been asking "everyone who comes in the room" for some Tylenol.                                                                  Hospital Course   Small acute left cerebellar infarct.  With some elements of transient global amnesia, she has prior infarcts in the cerebellum as well, short term memory issues resolved, f/u neuro w/u and  recommendations, Stroke team to evaluate, currently on DAPT 3 weeks thereafter ASA alone, on statin continue.  EEG unremarkable.  Echo stable.  CTA is stable.  Clinically improved.  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 Roda Shutters who has cleared the patient to be discharged home, she will follow-up with PCP, neurology and hematology oncology postdischarge.  Will also seen by oncology while she was here.  Symptoms much improved and almost back to her baseline now.   Rash - resolved   Essential thrombocythemia (HCC)  - cont Hydroxurea, follow with Onc      Discharge diagnosis     Principal Problem:   Stroke [facility]) Active Problems:   Essential thrombocythemia [facility])    Discharge instructions    Discharge Instructions     Ambulatory referral to Neurology   Complete by: As directed    Follow up  with stroke clinic NP at [facility] in about 4-6 weeks. Thanks.   Diet - low sodium heart healthy   Complete by: As directed    Discharge instructions   Complete by: As directed    Follow with Primary MD [name], MD in 7 days   Get CBC, CMP,  -  checked next visit with your primary MD   Activity: As tolerated with Full fall precautions use walker/cane & assistance as needed  Disposition Home   Diet: Heart Healthy  Special Instructions: If you have smoked or chewed Tobacco  in the last 2 yrs please stop smoking, stop any regular Alcohol  and or any Recreational drug use.  On your next visit with your primary care physician please Get Medicines reviewed and adjusted.  Please request your Prim.MD to go over all Hospital Tests and Procedure/Radiological results at the follow up, please get all Hospital records sent to your Prim MD by signing hospital release before you go home.  If you experience worsening of your admission symptoms, develop shortness of breath, life threatening emergency, suicidal or homicidal thoughts you must  seek medical attention immediately by calling 911 or calling your MD immediately  if symptoms less severe.  You Must read complete instructions/literature along with all the possible adverse reactions/side effects for all the Medicines you take and that have been prescribed to you. Take any new Medicines after you have completely understood and accpet all the possible adverse reactions/side effects.   Do not drive when taking Pain medications.  Do not take more than prescribed Pain, Sleep and Anxiety Medications   Increase activity slowly   Complete by: As directed        Discharge Medications   Allergies as of 10/05/2023       Reactions   Bupropion Other (See Comments)   POLYURIAN   Aspirin    Kidney Damage   Nsaids    Renal insufficiency- no per pt        Medication List     TAKE these medications    ascorbic acid 500 MG tablet Commonly known as: VITAMIN C Take 500 mg by mouth daily.   aspirin EC 81 MG tablet Take 1 tablet (81 mg total) by mouth daily. Swallow whole. Start taking on: October 06, 2023   atorvastatin 40 MG tablet Commonly known as: LIPITOR Take 1 tablet (40 mg total) by mouth daily. Start taking on: October 06, 2023 What changed:  medication strength how much to take   Azelastine HCl 137 MCG/SPRAY Soln Place 2 sprays into both nostrils 2 (two) times daily. Use in each nostril as directed   clopidogrel 75 MG tablet Commonly known as: PLAVIX Take 1 tablet (75 mg total) by mouth daily. Start taking on: October 06, 2023   clotrimazole 1 % vaginal cream Commonly known as: GYNE-LOTRIMIN Place 1 Applicatorful vaginally at bedtime.   ferrous sulfate 324 (65 Fe) MG Tbec Take 1 tablet (324 mg total) by mouth daily with breakfast.   Hair Skin and Nails Formula Tabs Take 1 tablet by mouth daily.   hydroxyurea 500 MG capsule Commonly known as: HYDREA Take 1 capsule (500 mg total) by mouth daily.   olopatadine 0.1 % ophthalmic solution Commonly  known as: PATANOL Place 1 drop into both eyes 2 (two) times daily.   omeprazole 20 MG capsule Commonly known as: PRILOSEC Take 1 capsule (20 mg total) by mouth daily.   QC Tumeric Complex 500 MG Caps Generic drug: Turmeric Take  2 tablets by mouth daily.   traZODone 50 MG tablet Commonly known as: DESYREL Take 1-2 tablets (50-100 mg total) by mouth at bedtime as needed for sleep.   Vitamin D (Ergocalciferol) 1.25 MG (50000 UNIT) Caps capsule Commonly known as: DRISDOL Take 1 capsule (50,000 Units total) by mouth every 7 (seven) days.         Follow-up Information     [facility]. Schedule an appointment as soon as possible for a visit in 1 month(s).   Specialty: Neurology Why: stroke clinic Contact information: [address] [place] [address] [phone]        [name], MD. Schedule an appointment as soon as possible for a visit in 1 week(s).   Specialty: Pulmonary Disease Contact information: [address] [place] [address] [phone]                 Major procedures and Radiology Reports - PLEASE review detailed and final reports thoroughly  -       VAS [place] TRANSCRANIAL DOPPLER W BUBBLES Result Date: 10/05/2023  Transcranial Doppler with Bubble Patient Name:  ADILENA TELLER  Date of Exam:   10/05/2023 Medical Rec #: [id number]      Accession #:    [id number] Date of Birth: [birth year]     Patient Gender: F Patient Age:   7 years Exam Location:  [facility] Procedure:      VAS [place] TRANSCRANIAL DOPPLER W BUBBLES Referring Phys: Scheryl Marten XU --------------------------------------------------------------------------------  Indications: Stroke. Comparison Study: No previous exams Performing Technologist: Sherren Kerns RVS  Examination Guidelines: A complete evaluation includes B-mode imaging, spectral Doppler, color Doppler, and power Doppler as needed of all accessible  portions of each vessel. Bilateral testing is considered an integral part of a complete examination. Limited examinations for reoccurring indications may be performed as noted.  Summary: No HITS at rest or during Valsalva. Negative transcranial Doppler Bubble study with no evidence of right to left intracardiac communication.  A vascular evaluation was performed. The left middle cerebral artery was studied. An IV was inserted into the patient's right AC. Verbal informed consent was obtained.  *See table(s) above for TCD measurements and observations.    Preliminary    EEG adult Result Date: 10/05/2023 Charlsie Quest, MD     10/05/2023  8:46 AM Patient Name: ZISSY EAMES MRN: [id number] Epilepsy Attending: Charlsie Quest Referring Physician/Provider: Rejeana Brock, MD Date: 10/04/2023 Duration: 36.02 mins Patient history: 48 y.o. female with PMH significant for prior stroke (2016), HTN, HLD, Anxiety/Depression who presented to [facility] 1/17 with altered mental status and confusion, with short-term memoty loss also noted. EEG to evaluate for seizure Level of alertness: Awake, asleep AEDs during EEG study: None Technical aspects: This EEG study was done with scalp electrodes positioned according to the 10-20 International system of electrode placement. Electrical activity was reviewed with band pass filter of 1-70Hz , sensitivity of 7 uV/mm, display speed of 35mm/sec with a 60Hz  notched filter applied as appropriate. EEG data were recorded continuously and digitally stored.  Video monitoring was available and reviewed as appropriate. Description: The posterior dominant rhythm consists of 8 Hz activity of moderate voltage (25-35 uV) seen predominantly in posterior head regions, symmetric and reactive to eye opening and eye closing. Sleep was characterized by vertex waves, sleep spindles (12 to 14 Hz), maximal frontocentral region. EEG showed intermittent generalized 5 to 7 Hz theta slowing. Hyperventilation and  photic stimulation were not performed.   ABNORMALITY - Intermittent slow, generalized IMPRESSION: This study is suggestive of mild diffuse encephalopathy. No seizures or epileptiform discharges were seen throughout the recording. Charlsie Quest   ECHOCARDIOGRAM COMPLETE Result Date: 10/04/2023    ECHOCARDIOGRAM REPORT   Patient Name:   ANAYJAH ZAKRZEWSKI Date of Exam: 10/04/2023 Medical Rec #:  [id number]     Height:       70.0 in Accession #:    [id number]    Weight:       210.0 lb Date of Birth:  08-20-1976    BSA:          2.131 m Patient Age:    47 years      BP:           132/81 mmHg Patient Gender: F             HR:           66 bpm. Exam Location:  Inpatient Procedure: 2D Echo, Color Doppler and Cardiac Doppler Indications:    Stroke i63.9  History:        Patient has no prior history of Echocardiogram examinations.                 Risk Factors:Hypertension and Dyslipidemia.  Sonographer:    Irving Burton Senior RDCS Referring Phys: [id number] SAIMA RIZWAN IMPRESSIONS  1. Left ventricular ejection fraction, by estimation, is 60 to 65%. The left ventricle has normal function. The left ventricle has no regional wall motion abnormalities. Left ventricular diastolic parameters were normal.  2. Right ventricular systolic function is normal. The right ventricular size is normal. There is normal pulmonary artery systolic pressure. The estimated right ventricular systolic pressure is 21.3 mmHg.  3. The mitral valve is normal in structure. Mild mitral valve regurgitation. No evidence of mitral stenosis.  4. The aortic valve is tricuspid. Aortic valve regurgitation is not visualized. No aortic stenosis is present.  5. The inferior vena cava is normal in size with greater than 50% respiratory variability, suggesting right atrial pressure of 3 mmHg. Comparison(s): No prior Echocardiogram. FINDINGS  Left Ventricle: Left ventricular ejection fraction, by estimation, is 60 to 65%. The left ventricle has normal function. The left ventricle  has no regional wall motion abnormalities. The left ventricular internal cavity size was normal in size. There is  no left ventricular hypertrophy. Left ventricular diastolic parameters were normal. Right Ventricle: The right ventricular size is normal. Right ventricular systolic function is normal. There is normal pulmonary artery systolic pressure. The tricuspid regurgitant velocity is 2.14 m/s, and with an assumed right atrial pressure of 3 mmHg,  the estimated right ventricular systolic pressure is 21.3 mmHg. Left Atrium: Left atrial size was normal in size. Right Atrium: Right atrial size was normal in size. Pericardium: There is no evidence of pericardial effusion. Mitral Valve: The mitral valve is normal in structure. Mild mitral valve regurgitation. No evidence of mitral valve stenosis. Tricuspid Valve: The tricuspid valve is normal in structure. Tricuspid valve regurgitation is mild . No evidence of tricuspid stenosis. Aortic Valve: The aortic valve is tricuspid. Aortic valve regurgitation is not visualized. No aortic stenosis is present. Pulmonic Valve: The pulmonic valve was normal in structure. Pulmonic valve regurgitation is trivial. No evidence of pulmonic stenosis. Aorta: The aortic root and ascending aorta are structurally normal, with no evidence of dilitation. Venous: The inferior vena cava is normal in size with greater than 50% respiratory variability, suggesting right atrial pressure  of 3 mmHg. IAS/Shunts: No atrial level shunt detected by color flow Doppler.  LEFT VENTRICLE PLAX 2D LVIDd:         4.70 cm   Diastology LVIDs:         2.90 cm   LV e' medial:    7.94 cm/s LV PW:         1.00 cm   LV E/e' medial:  8.7 LV IVS:        0.80 cm   LV e' lateral:   8.16 cm/s LVOT diam:     2.10 cm   LV E/e' lateral: 8.5 LV SV:         81 LV SV Index:   38 LVOT Area:     3.46 cm  RIGHT VENTRICLE RV S prime:     10.30 cm/s TAPSE (M-mode): 2.0 cm LEFT ATRIUM             Index        RIGHT ATRIUM            Index LA diam:        3.70 cm 1.74 cm/m   RA Area:     19.60 cm LA Vol (A2C):   55.0 ml 25.81 ml/m  RA Volume:   55.90 ml  26.23 ml/m LA Vol (A4C):   40.3 ml 18.91 ml/m LA Biplane Vol: 47.6 ml 22.34 ml/m  AORTIC VALVE LVOT Vmax:   111.00 cm/s LVOT Vmean:  82.600 cm/s LVOT VTI:    0.235 m  AORTA Ao Root diam: 3.50 cm Ao Asc diam:  3.30 cm MITRAL VALVE               TRICUSPID VALVE MV Area (PHT): 1.96 cm    TR Peak grad:   18.3 mmHg MV Decel Time: 388 msec    TR Vmax:        214.00 cm/s MV E velocity: 69.20 cm/s MV A velocity: 63.40 cm/s  SHUNTS MV E/A ratio:  1.09        Systemic VTI:  0.24 m                            Systemic Diam: 2.10 cm Olga Millers MD Electronically signed by Olga Millers MD Signature Date/Time: 10/04/2023/1:43:56 PM    Final    CT ANGIO HEAD NECK W WO CM Result Date: 10/03/2023 CLINICAL DATA:  Slurred speech and memory loss EXAM: CT ANGIOGRAPHY HEAD AND NECK WITH AND WITHOUT CONTRAST TECHNIQUE: Multidetector CT imaging of the head and neck was performed using the standard protocol during bolus administration of intravenous contrast. Multiplanar CT image reconstructions and MIPs were obtained to evaluate the vascular anatomy. Carotid stenosis measurements (when applicable) are obtained utilizing NASCET criteria, using the distal internal carotid diameter as the denominator. RADIATION DOSE REDUCTION: This exam was performed according to the departmental dose-optimization program which includes automated exposure control, adjustment of the mA and/or kV according to patient size and/or use of iterative reconstruction technique. CONTRAST:  OMNIPAQUE IOHEXOL 350 MG/ML SOLN COMPARISON:  None Available. FINDINGS: CTA NECK FINDINGS Skeleton: No acute abnormality or high grade bony spinal canal stenosis. Other neck: Normal pharynx, larynx and major salivary glands. No cervical lymphadenopathy. Unremarkable thyroid gland. Upper chest: No pneumothorax or pleural effusion. No nodules or  masses. Aortic arch: There is no calcific atherosclerosis of the aortic arch. Conventional 3 vessel aortic branching pattern. RIGHT carotid system: Normal without aneurysm, dissection  or stenosis. LEFT carotid system: Normal without aneurysm, dissection or stenosis. Vertebral arteries: Codominant configuration. There is no dissection, occlusion or flow-limiting stenosis to the skull base (V1-V3 segments). CTA HEAD FINDINGS POSTERIOR CIRCULATION: Vertebral arteries are normal. No proximal occlusion of the anterior or inferior cerebellar arteries. Basilar artery is normal. Superior cerebellar arteries are normal. Posterior cerebral arteries are normal. ANTERIOR CIRCULATION: Intracranial internal carotid arteries are normal. Anterior cerebral arteries are normal. Middle cerebral arteries are normal. Venous sinuses: As permitted by contrast timing, patent. Anatomic variants: None Review of the MIP images confirms the above findings. IMPRESSION: No emergent large vessel occlusion or high-grade stenosis of the intracranial arteries. Electronically Signed   By: Deatra Robinson M.D.   On: 10/03/2023 21:48   MR BRAIN WO CONTRAST Result Date: 10/03/2023 CLINICAL DATA:  Neuro deficit, acute, stroke suspected. Slurred speech. Memory loss. EXAM: MRI HEAD WITHOUT CONTRAST TECHNIQUE: Multiplanar, multiecho pulse sequences of the brain and surrounding structures were obtained without intravenous contrast. COMPARISON:  Head CT 10/03/2023 FINDINGS: Brain: There is an 8 mm acute infarct peripherally in the posterosuperior left cerebellar hemisphere. Multiple small chronic cerebellar infarcts are present bilaterally. Scattered small T2 hyperintensities in the cerebral white matter bilaterally are nonspecific but compatible with mild chronic small vessel ischemic disease. The ventricles are normal in size. No intracranial hemorrhage, mass, midline shift, or extra-axial fluid collection is identified. Vascular: Major intracranial  vascular flow voids are preserved. Skull and upper cervical spine: Unremarkable bone marrow signal. Sinuses/Orbits: Unremarkable orbits. Right maxillary sinus mucous retention cyst. Trace right mastoid fluid. Other: None. IMPRESSION: 1. Small acute left cerebellar infarct. 2. Chronic bilateral cerebellar infarcts. 3. Mild chronic small vessel ischemic disease in the cerebral white matter. Electronically Signed   By: Sebastian Ache M.D.   On: 10/03/2023 17:01   CT HEAD WO CONTRAST Result Date: 10/03/2023 CLINICAL DATA:  Transient slurred speech and memory loss, concern for TIA EXAM: CT HEAD WITHOUT CONTRAST TECHNIQUE: Contiguous axial images were obtained from the base of the skull through the vertex without intravenous contrast. RADIATION DOSE REDUCTION: This exam was performed according to the departmental dose-optimization program which includes automated exposure control, adjustment of the mA and/or kV according to patient size and/or use of iterative reconstruction technique. COMPARISON:  11/24/2022 FINDINGS: Brain: No evidence of acute infarction, hemorrhage, mass, mass effect, or midline shift. No hydrocephalus or extra-axial fluid collection. Vascular: No hyperdense vessel. Skull: Negative for fracture or focal lesion. Sinuses/Orbits: No acute finding. Other: The mastoid air cells are well aerated. IMPRESSION: No acute intracranial process. Electronically Signed   By: Wiliam Ke M.D.   On: 10/03/2023 16:01    Micro Results    No results found for this or any previous visit (from the past 240 hours).  Today   Subjective    Blima Dessert today has no headache,no chest abdominal pain,no new weakness tingling or numbness, feels much better wants to go home today.    Objective   Blood pressure 134/82, pulse 68, temperature 98 F (36.7 C), temperature source Oral, resp. rate 17, height 5\' 10"  (1.778 m), weight 95.2 kg, SpO2 97%.   Intake/Output Summary (Last 24 hours) at 10/05/2023 1610 Last  data filed at 10/04/2023 2100 Gross per 24 hour  Intake 120 ml  Output --  Net 120 ml    Exam  Awake Alert, No new F.N deficits,    [place].AT,PERRAL Supple Neck,   Symmetrical Chest wall movement, Good air movement bilaterally, CTAB RRR,No Gallops,   +ve  B.Sounds, Abd Soft, Non tender,  No Cyanosis, Clubbing or edema    Data Review   Recent Labs  Lab 10/03/23 1417 10/03/23 1421  WBC 10.5  --   HGB 13.8 14.3  HCT 40.6 42.0  PLT 418*  --   MCV 98.5  --   MCH 33.5  --   MCHC 34.0  --   RDW 13.2  --   LYMPHSABS 2.6  --   MONOABS 0.7  --   EOSABS 0.1  --   BASOSABS 0.1  --     Recent Labs  Lab 10/03/23 1417 10/03/23 1421 10/03/23 2048  NA 139 139  --   K 4.3 4.7  --   CL 104 104  --   CO2 26  --   --   ANIONGAP 9  --   --   GLUCOSE 100* 95  --   BUN 20 24*  --   CREATININE 0.68 0.90  --   AST 37  --   --   ALT 52*  --   --   ALKPHOS 51  --   --   BILITOT 0.6  --   --   ALBUMIN 4.6  --   --   INR 1.0  --   --   HGBA1C  --   --  5.4  CALCIUM 9.7  --   --     Total Time in preparing paper work, data evaluation and todays exam - 35 minutes  Signature  -    Susa Raring M.D on 10/05/2023 at 4:10 PM   -  To page go to [email or web address]

## 2023-10-05 NOTE — Progress Notes (Signed)
VASCULAR LAB    TCD bubble study has been performed.  See CV proc for preliminary results.   Alzena Gerber, RVT 10/05/2023, 3:38 PM

## 2023-10-06 ENCOUNTER — Telehealth: Payer: Self-pay | Admitting: Hematology

## 2023-10-06 ENCOUNTER — Other Ambulatory Visit: Payer: Self-pay

## 2023-10-06 ENCOUNTER — Other Ambulatory Visit (HOSPITAL_COMMUNITY): Payer: Self-pay

## 2023-10-06 ENCOUNTER — Telehealth: Payer: Self-pay

## 2023-10-06 LAB — PROTEIN C, TOTAL: Protein C, Total: 114 % (ref 60–150)

## 2023-10-06 LAB — BETA-2-GLYCOPROTEIN I ABS, IGG/M/A
Beta-2 Glyco I IgG: <9 GPI IgG units (ref 0–20)
Beta-2-Glycoprotein I IgA: <9 GPI IgA units (ref 0–25)
Beta-2-Glycoprotein I IgM: <9 GPI IgM units (ref 0–32)

## 2023-10-06 LAB — PROTEIN S ACTIVITY: Protein S Activity: 63 % (ref 63–140)

## 2023-10-06 LAB — CARDIOLIPIN ANTIBODIES, IGG, IGM, IGA
Anticardiolipin IgA: <9 [APL'U]/mL (ref 0–11)
Anticardiolipin IgG: <9 [GPL'U]/mL (ref 0–14)
Anticardiolipin IgM: <9 [MPL'U]/mL (ref 0–12)

## 2023-10-06 LAB — HOMOCYSTEINE: Homocysteine: 8.6 umol/L (ref 0.0–14.5)

## 2023-10-06 LAB — LUPUS ANTICOAGULANT PANEL
DRVVT: 30.2 s (ref 0.0–47.0)
PTT Lupus Anticoagulant: 27.9 s (ref 0.0–43.5)

## 2023-10-06 LAB — PROTEIN C ACTIVITY: Protein C Activity: 104 % (ref 73–180)

## 2023-10-06 LAB — PROTEIN S, TOTAL: Protein S Ag, Total: 84 % (ref 60–150)

## 2023-10-06 NOTE — Transitions of Care (Post Inpatient/ED Visit) (Signed)
   10/06/2023  Name: Alicia Robinson MRN: [id number] DOB: 03-06-1976  Today's TOC FU Call Status: Today's TOC FU Call Status:: Successful TOC FU Call Completed TOC FU Call Complete Date: 10/06/23 Patient's Name and Date of Birth confirmed.  Transition Care Management Follow-up Telephone Call Date of Discharge: 10/05/23 Discharge Facility: [facility]) Type of Discharge: Inpatient Admission Primary Inpatient Discharge Diagnosis:: stroke How have you been since you were released from the hospital?: Better (She said sometimes she still feels a little dizzy when she gets up.) Any questions or concerns?: No  Items Reviewed: Did you receive and understand the discharge instructions provided?: Yes Medications obtained,verified, and reconciled?: No Medications Not Reviewed Reasons:: Other: (She said she has all of her medications and did not have any questions about the med regime. She said she did not need to review the list because they reviewed it wiht her before she left the hospital) Any new allergies since your discharge?: No Dietary orders reviewed?: Yes Type of Diet Ordered:: heart healthy, low sodium Do you have support at home?: Yes People in Home: alone Name of Support/Comfort Primary Source: her daughter is currently staying with her,  Medications Reviewed Today: Medications Reviewed Today   Medications were not reviewed in this encounter     Home Care and Equipment/Supplies: Were Home Health Services Ordered?: No Any new equipment or medical supplies ordered?: No  Functional Questionnaire: Do you need assistance with bathing/showering or dressing?: No Do you need assistance with meal preparation?: No Do you need assistance with eating?: No Do you have difficulty maintaining continence: No Do you need assistance with getting out of bed/getting out of a chair/moving?: No Do you have difficulty managing or taking your medications?: No  Follow up appointments  reviewed: PCP Follow-up appointment confirmed?: Yes Date of PCP follow-up appointment?: 10/23/23 Follow-up Provider: Rose Phi Clung,PA.  She would like to go back to work and I explained that a provider should see her prior to returning to work There are not any appointments at [facility] prior to 10/23/2023. I explained to her that we have a MMU and she said she has seen it at [facility].  I told her that it is at [employer] center tomorrow but she was just discharged from the hospital yesterday and that is quite soon to be seen for HFU. Next Tues, 10/14/2023 the MMU is at [facility].  I explained their hours and she was very appreciative of the information. Specialist Hospital Follow-up appointment confirmed?: Yes Date of Specialist follow-up appointment?: 11/10/23 Follow-Up Specialty Provider:: oncology.  She will need to schedule her appointment with neurolgy, the phone number is on AVS Do you need transportation to your follow-up appointment?: No Do you understand care options if your condition(s) worsen?: Yes-patient verbalized understanding    SIGNATURE Robyne Peers, RN

## 2023-10-06 NOTE — Telephone Encounter (Signed)
Patient is aware of scheduled appointment times/dates for follow up per staff message on 10/06/2023

## 2023-10-07 ENCOUNTER — Ambulatory Visit: Payer: BC Managed Care – PPO | Admitting: Physician Assistant

## 2023-10-07 ENCOUNTER — Other Ambulatory Visit (HOSPITAL_COMMUNITY): Payer: Self-pay

## 2023-10-07 ENCOUNTER — Encounter: Payer: Self-pay | Admitting: Physician Assistant

## 2023-10-07 ENCOUNTER — Other Ambulatory Visit: Payer: Self-pay

## 2023-10-07 VITALS — BP 131/86 | HR 70 | Ht 70.0 in | Wt 213.0 lb

## 2023-10-07 DIAGNOSIS — Z8673 Personal history of transient ischemic attack (TIA), and cerebral infarction without residual deficits: Secondary | ICD-10-CM

## 2023-10-09 ENCOUNTER — Encounter: Payer: Self-pay | Admitting: Physician Assistant

## 2023-10-09 NOTE — Patient Instructions (Signed)
Emotional Health After Stroke Your emotional health may change after a stroke. You may have fear, anxiety, and other feelings. Some of these changes happen because a stroke can damage your brain and nervous system. You may also have these feelings because coping with a change in your health can feel overwhelming. Depression and other emotional changes can slow your recovery after a stroke. It is important to recognize the symptoms so that you can take steps to strengthen your emotional health. What are some common emotions after a stroke? You may feel: Fear. Anxiety. Anger. Frustration. Sadness. Loss or grief. You may cry or laugh at the wrong time or in the wrong situation (pseudobulbar affect, orPBA). How to recognize symptoms of depression Depression is common after a stroke. It can happen right away, or it can show up later. Symptoms of depression may include: Physical changes, such as: Sleep problems. Weight gain or weight loss. Not having energy or enthusiasm (lethargy). Feeling very tired (fatigue). Emotional changes, such as: Irritability. Crying more than usual. Mood swings. Feeling hopeless. Hating yourself. Having suicidal thoughts. Behavioral changes such as: Avoiding people and activities (social withdrawal). Not being able to concentrate. Changes in eating habits, such as eating too much or too little. What increases my risk of depression? Having a stroke raises the risk of depression. The risk also goes up if you: Are socially isolated. Have a family history of depression. Have a personal history of depression or other mental health problems before the stroke. Are unable to work or do activities that you previously enjoyed. Feel dependent on others for daily activities. Use drugs or alcohol. What are some coping methods I can use? Ask your health care provider for help. Recommend treatments for depression may include: Counseling with a mental health professional  to help with depression and stroke recovery. Therapies performed by a health care provider, such as brain stimulation, acupuncture, and pet therapy. Lifestyle changes, such as eating a healthy diet and avoiding alcohol. Antidepressant medicines. Alternative techniques, such as meditation, listening to music, or using a light box. Other coping strategies may include: Physical therapy or exercises every day to help you with movement. Writing your thoughts in a journal. An example might be keeping track of negative thoughts or keeping a log of things you feel grateful for. Practicing good sleep habits, such as going to bed and getting up at the same time every day. Following a routine each day. Participating in activities that make you laugh. Mindfulness therapy. This may include meditation and other techniques to lower your stress. Joining a support group for people who are recovering from a stroke. These groups provide social interaction and help you feel connected to others. Your health care team can help you find a support group in your area. Where to find more information American Stroke Association: www.stroke.org Contact a health care provider if: You have emotions and feelings that deepen and are cause for concern. Get help right away if: You feel hopeless. You have thoughts of hurting yourself or someone else. You feel suicidal. Get help right away if you feel like you may hurt yourself or others, or have thoughts about taking your own life. Go to your nearest emergency room or: Call 911. Call the National Suicide Prevention Lifeline at 612-847-4599 or 988. This is open 24 hours a day. Text the Crisis Text Line at 7702899583. This information is not intended to replace advice given to you by your health care provider. Make sure you discuss any questions  you have with your health care provider. Document Revised: 07/18/2022 Document Reviewed: 07/18/2022 Elsevier Patient Education  2024  [employer].

## 2023-10-09 NOTE — Progress Notes (Signed)
Established Patient Office Visit  Subjective   Patient ID: Alicia Robinson, female    DOB: 11-10-1975  Age: 48 y.o. MRN: [id number]  Chief Complaint  Patient presents with   Hospitalization Follow-up    Stroke    Discussed the use of AI scribe software for clinical note transcription with the patient, who gave verbal consent to proceed.  History of Present Illness         The patient, recently hospitalized for a stroke,  reports feeling generally well, but expresses concerns about returning to work due to quickly fatigued when using a computer. She also reports intermittent headaches, which she describes as not severe enough to warrant medication.  The patient also inquires about the safety of air travel in her current condition, as she wishes to visit her mother in the near future. She reports feeling generally okay, with blood pressure readings consistently at the upper end of normal. She also mentions that she is always well-hydrated.  The patient's daughter, present during the consultation, asks about the need for constant supervision and the appropriateness of the patient resuming regular activities. The patient affirms that she feels okay and is not pushing herself too hard. She also mentions that she has an upcoming appointment with a neurologist.    Past Medical History:  Diagnosis Date   Anxiety    Depression    Drug reaction 10/22/2022   Hyperlipidemia    Hypertension    Stroke [facility]) 2016   "mild"   Suicidal overdose (HCC)    Social History   Socioeconomic History   Marital status: Single    Spouse name: Not on file   Number of children: 3   Years of education: Not on file   Highest education level: Associate degree: occupational, Scientist, product/process development, or vocational program  Occupational History   Not on file  Tobacco Use   Smoking status: Never   Smokeless tobacco: Never  Vaping Use   Vaping status: Never Used  Substance and Sexual Activity   Alcohol use: Yes     Comment: weekends, socially   Drug use: Not Currently    Types: Marijuana   Sexual activity: Not Currently  Other Topics Concern   Not on file  Social History Narrative   Not on file   Social Drivers of Health   Financial Resource Strain: Low Risk  (09/23/2023)   Overall Financial Resource Strain (CARDIA)    Difficulty of Paying Living Expenses: Not very hard  Food Insecurity: No Food Insecurity (10/04/2023)   Hunger Vital Sign    Worried About Running Out of Food in the Last Year: Never true    Ran Out of Food in the Last Year: Never true  Transportation Needs: No Transportation Needs (10/04/2023)   PRAPARE - Administrator, Civil Service (Medical): No    Lack of Transportation (Non-Medical): No  Physical Activity: Sufficiently Active (09/23/2023)   Exercise Vital Sign    Days of Exercise per Week: 5 days    Minutes of Exercise per Session: 90 min  Stress: No Stress Concern Present (09/23/2023)   [employer] of Occupational Health - Occupational Stress Questionnaire    Feeling of Stress : Not at all  Social Connections: Moderately Integrated (09/23/2023)   Social Connection and Isolation Panel [NHANES]    Frequency of Communication with Friends and Family: Three times a week    Frequency of Social Gatherings with Friends and Family: Once a week    Attends Religious Services:  More than 4 times per year    Active Member of Clubs or Organizations: Yes    Attends Banker Meetings: More than 4 times per year    Marital Status: Never married  Intimate Partner Violence: Not At Risk (10/04/2023)   Humiliation, Afraid, Rape, and Kick questionnaire    Fear of Current or Ex-Partner: No    Emotionally Abused: No    Physically Abused: No    Sexually Abused: No   Family History  Problem Relation Age of Onset   Hypertension Mother    Diabetes Mother    Hypertension Father    Diabetes Father    Diabetes Maternal Grandmother    Hypertension Maternal Grandmother     Colon cancer Neg Hx    Esophageal cancer Neg Hx    Rectal cancer Neg Hx    Stomach cancer Neg Hx    Allergies  Allergen Reactions   Bupropion Other (See Comments)    POLYURIAN   Aspirin     Kidney Damage   Nsaids     Renal insufficiency- no per pt    Review of Systems  Constitutional: Negative.   HENT: Negative.    Eyes: Negative.   Respiratory: Negative.    Cardiovascular: Negative.   Gastrointestinal: Negative.   Genitourinary: Negative.   Musculoskeletal: Negative.   Skin: Negative.   Neurological: Negative.   Endo/Heme/Allergies: Negative.   Psychiatric/Behavioral: Negative.        Objective:     BP 131/86 (BP Location: Left Arm, Patient Position: Sitting, Cuff Size: Large)   Pulse 70   Ht 5\' 10"  (1.778 m)   Wt 213 lb (96.6 kg)   SpO2 98%   BMI 30.56 kg/m  BP Readings from Last 3 Encounters:  10/07/23 131/86  10/05/23 134/82  09/24/23 (!) 130/90   Wt Readings from Last 3 Encounters:  10/07/23 213 lb (96.6 kg)  10/04/23 209 lb 15.8 oz (95.2 kg)  09/24/23 210 lb (95.3 kg)    Physical Exam Vitals and nursing note reviewed.  Constitutional:      Appearance: Normal appearance.  HENT:     Head: Normocephalic and atraumatic.     Right Ear: External ear normal.     Left Ear: External ear normal.     Nose: Nose normal.     Mouth/Throat:     Mouth: Mucous membranes are moist.     Pharynx: Oropharynx is clear.  Eyes:     Extraocular Movements: Extraocular movements intact.     Conjunctiva/sclera: Conjunctivae normal.     Pupils: Pupils are equal, round, and reactive to light.  Cardiovascular:     Rate and Rhythm: Normal rate and regular rhythm.     Pulses: Normal pulses.     Heart sounds: Normal heart sounds.  Pulmonary:     Effort: Pulmonary effort is normal.     Breath sounds: Normal breath sounds.  Musculoskeletal:        General: Normal range of motion.     Cervical back: Normal range of motion and neck supple.  Skin:    General: Skin is  warm and dry.  Neurological:     General: No focal deficit present.     Mental Status: She is alert and oriented to person, place, and time.     Cranial Nerves: No cranial nerve deficit.     Sensory: No sensory deficit.     Motor: No weakness.     Gait: Gait normal.  Psychiatric:  Mood and Affect: Mood normal.        Behavior: Behavior normal.        Thought Content: Thought content normal.        Judgment: Judgment normal.        Assessment & Plan:   Problem List Items Addressed This Visit       Other   History of CVA (cerebrovascular accident) - Primary  1. History of CVA (cerebrovascular accident) (Primary)   Patient is recovering from a recent stroke no physical deficits reported. Patient is advised to rest and increase activity as tolerated.  Continue with hospital follow up with PCP next week.    I have reviewed the patient's medical history (PMH, PSH, Social History, Family History, Medications, and allergies) , and have been updated if relevant. I spent 30 minutes reviewing chart and  face to face time with patient.   Return if symptoms worsen or fail to improve.    Kasandra Knudsen Mayers, PA-C

## 2023-10-10 LAB — FACTOR 5 LEIDEN

## 2023-10-10 LAB — PROTHROMBIN GENE MUTATION

## 2023-10-20 ENCOUNTER — Other Ambulatory Visit (HOSPITAL_COMMUNITY): Payer: Self-pay

## 2023-10-22 NOTE — Progress Notes (Signed)
 Patient ID: Alicia Robinson, female   DOB: 05/25/1976, 48 y.o.   MRN: [id number]    Alicia Robinson, is a 48 y.o. female  [id number]  [id number]  DOB - 03-24-1976  Chief Complaint  Patient presents with   Hospitalization Follow-up       Subjective:   [name] is a 48 y.o. female here today for a follow up visit After hospitalization 1/17-1/19/2025 for short tem memory loss and and confusion.  She feels mostly back to her baseline.  Sometimes has trouble finding words and feels tired along with mild vision changes.  Other than that she is abe to do ADL.  Non smoker.  CXR, CMP, and CBC recommended at follow up on discharge summary.  Has appt scheduled with hematololgy and neurology.  It is not noted how long she she will need to be on PlaviX  and aspirin .  She is compliant with meds.  Exercises, eats right, and drinks a lot of water.    From discharge summary: 48 y.o. female with medical history of thrombocythemia, prior CVA who presents to the hospital with confusion.  Patient is still having short-term memory loss and therefore her daughter gives the history.  Apparently she noted around 10:50 today that the patient was confused.  She did not know where she was and was asking random questions.  In addition she noted that her speech was extremely slow.  This has steadily improved to where she is more oriented and no longer having speech difficulty but it seems that she is still having trouble remembering recent events.  For example she complains of a frontal headache and she cannot remember that she just received Tylenol  and has been asking everyone who comes in the room for some Tylenol .                                                                   Hospital Course    Small acute left cerebellar infarct.  With some elements of transient global amnesia, she has prior infarcts in the cerebellum as well, short term memory issues resolved, f/u neuro w/u and recommendations, Stroke team to  evaluate, currently on DAPT 3 weeks thereafter ASA alone, on statin continue.  EEG unremarkable.  Echo stable.  CTA is stable.  Clinically improved.  Has underlying essential thrombocythemia for which hydroxyurea  will be continued, seen by neurologist Dr. Jerri who has cleared the patient to be discharged home, she will follow-up with PCP, neurology and hematology oncology postdischarge.  Will also seen by oncology while she was here.  Symptoms much improved and almost back to her baseline now.   Rash - resolved   Essential thrombocythemia (HCC)  - cont Hydroxurea, follow with Onc  No problems updated.  ALLERGIES: Allergies  Allergen Reactions   Bupropion  Other (See Comments)    POLYURIAN   Aspirin      Kidney Damage   Nsaids     Renal insufficiency- no per pt    PAST MEDICAL HISTORY: Past Medical History:  Diagnosis Date   Anxiety    Arthritis    Cancer (HCC) Feb26, 2024   Et   Depression    Drug reaction 10/22/2022   GERD (gastroesophageal reflux disease)    Hyperlipidemia    Hypertension  Stroke [facility]) 2016   mild   Suicidal overdose (HCC)     MEDICATIONS AT HOME: Prior to Admission medications   Medication Sig Start Date End Date Taking? Authorizing Provider  ascorbic acid (VITAMIN C) 500 MG tablet Take 500 mg by mouth daily.   Yes [provider]  aspirin  EC 81 MG tablet Take 1 tablet (81 mg total) by mouth daily. Swallow whole. 10/06/23  Yes Dennise Lavada POUR, MD  atorvastatin  (LIPITOR) 40 MG tablet Take 1 tablet (40 mg total) by mouth daily. 10/06/23  Yes Singh, Prashant K, MD  ferrous sulfate  324 MG TBEC Take 1 tablet (324 mg total) by mouth daily with breakfast. 03/26/23  Yes Burton, Lacie K, NP  hydroxyurea  (HYDREA ) 500 MG capsule Take 1 capsule (500 mg total) by mouth daily. 09/16/23  Yes Burton, Lacie K, NP  Multiple Vitamins-Minerals (HAIR SKIN AND NAILS FORMULA) TABS Take 1 tablet by mouth daily.   Yes [provider]  omeprazole  (PRILOSEC) 20  MG capsule Take 1 capsule (20 mg total) by mouth daily. 05/08/23  Yes Brien Belvie BRAVO, MD  traZODone  (DESYREL ) 50 MG tablet Take 1-2 tablets (50-100 mg total) by mouth at bedtime as needed for sleep. 09/05/22 10/23/23 Yes McQuilla, Jai B, MD  Vitamin D , Ergocalciferol , (DRISDOL ) 1.25 MG (50000 UNIT) CAPS capsule Take 1 capsule (50,000 Units total) by mouth every 7 (seven) days. 05/08/23  Yes Brien Belvie BRAVO, MD  azelastine  (ASTELIN ) 0.1 % nasal spray Place 2 sprays into both nostrils 2 (two) times daily. Use in each nostril as directed 01/02/23   Brien Belvie BRAVO, MD  clopidogrel  (PLAVIX ) 75 MG tablet Take 1 tablet (75 mg total) by mouth daily. 10/23/23   Makensey Rego M, PA-C  clotrimazole  (GYNE-LOTRIMIN ) 1 % vaginal cream Place 1 Applicatorful vaginally at bedtime. 09/23/23   Newlin, Enobong, MD  olopatadine  (PATANOL) 0.1 % ophthalmic solution Place 1 drop into both eyes 2 (two) times daily. 01/02/23   Brien Belvie BRAVO, MD  Turmeric (QC TUMERIC COMPLEX) 500 MG CAPS Take 2 tablets by mouth daily.    [provider]    ROS: Neg HEENT Neg resp Neg cardiac Neg GI Neg GU Neg MS Neg psych Neg neuro  Objective:   Vitals:   10/23/23 0910  BP: 122/85  Pulse: 75  Resp: 19  SpO2: 97%  Weight: 209 lb 3.2 oz (94.9 kg)  Height: 5' 10 (1.778 m)   Exam General appearance : Awake, alert, not in any distress. Speech Clear. Not toxic looking HEENT: Atraumatic and Normocephalic, pupils equally reactive to light and accomodation, EOM intact Neck: Supple, no JVD. No cervical lymphadenopathy.  Chest: Good air entry bilaterally, CTAB.  No rales/rhonchi/wheezing CVS: S1 S2 regular, no murmurs.  Extremities: B/L Lower Ext shows no edema, both legs are warm to touch Neurology: Awake alert, and oriented X 3, CN II-XII intact, Non focal Skin: No Rash  Data Review Lab Results  Component Value Date   HGBA1C 5.4 10/03/2023   HGBA1C 5.4 10/22/2022   HGBA1C 5.7 (H) 03/10/2020    Assessment &  Plan   1. History of CVA (cerebrovascular accident) (Primary) Keep heme and neurology.  Reviewed all labs done - Ambulatory referral to Ophthalmology - clopidogrel  (PLAVIX ) 75 MG tablet; Take 1 tablet (75 mg total) by mouth daily.  Dispense: 30 tablet; Refill: 0 - DG Chest 2 View; Future - Comprehensive metabolic panel - TSH  2. Vision changes - Ambulatory referral to Ophthalmology  3. Hypertension, unspecified type  BP is good today - Comprehensive metabolic panel  4. Hyperlipidemia, unspecified hyperlipidemia type On atorvastain - Comprehensive metabolic panel  5. Thrombocytosis On hydroxyurea  - DG Chest 2 View; Future - CBC with Differential  6. Vitamin D  insufficiency - Vitamin D , 25-hydroxy  7. Fatigue, unspecified type - DG Chest 2 View; Future - CBC with Differential - TSH  8. Hospital discharge follow-up    Return in about 2 months (around 12/21/2023) for PCP for chronic conditions-please assign to one of our other PCP.  The patient was given clear instructions to go to ER or return to medical center if symptoms don't improve, worsen or new problems develop. The patient verbalized understanding. The patient was told to call to get lab results if they haven't heard anything in the next week.      Jon Moores, PA-C [facility] [place], [place] [phone]   10/23/2023, 9:35 AM

## 2023-10-23 ENCOUNTER — Other Ambulatory Visit: Payer: Self-pay

## 2023-10-23 ENCOUNTER — Ambulatory Visit: Payer: BC Managed Care – PPO | Attending: Physician Assistant | Admitting: Physician Assistant

## 2023-10-23 ENCOUNTER — Other Ambulatory Visit (HOSPITAL_COMMUNITY): Payer: Self-pay

## 2023-10-23 ENCOUNTER — Encounter: Payer: Self-pay | Admitting: Physician Assistant

## 2023-10-23 VITALS — BP 122/85 | HR 75 | Resp 19 | Ht 70.0 in | Wt 209.2 lb

## 2023-10-23 DIAGNOSIS — E785 Hyperlipidemia, unspecified: Secondary | ICD-10-CM

## 2023-10-23 DIAGNOSIS — Z09 Encounter for follow-up examination after completed treatment for conditions other than malignant neoplasm: Secondary | ICD-10-CM

## 2023-10-23 DIAGNOSIS — E559 Vitamin D deficiency, unspecified: Secondary | ICD-10-CM | POA: Diagnosis not present

## 2023-10-23 DIAGNOSIS — D75839 Thrombocytosis, unspecified: Secondary | ICD-10-CM

## 2023-10-23 DIAGNOSIS — R5383 Other fatigue: Secondary | ICD-10-CM

## 2023-10-23 DIAGNOSIS — I10 Essential (primary) hypertension: Secondary | ICD-10-CM | POA: Diagnosis not present

## 2023-10-23 DIAGNOSIS — Z8673 Personal history of transient ischemic attack (TIA), and cerebral infarction without residual deficits: Secondary | ICD-10-CM

## 2023-10-23 DIAGNOSIS — H539 Unspecified visual disturbance: Secondary | ICD-10-CM | POA: Diagnosis not present

## 2023-10-23 MED ORDER — CLOPIDOGREL BISULFATE 75 MG PO TABS
75.0000 mg | ORAL_TABLET | Freq: Every day | ORAL | 0 refills | Status: DC
  Filled 2023-10-23 (×2): qty 30, 30d supply, fill #0

## 2023-10-24 LAB — CBC WITH DIFFERENTIAL/PLATELET
Basophils Absolute: 0 10*3/uL (ref 0.0–0.2)
Basos: 1 %
EOS (ABSOLUTE): 0.2 10*3/uL (ref 0.0–0.4)
Eos: 4 %
Hematocrit: 41.3 % (ref 34.0–46.6)
Hemoglobin: 13.7 g/dL (ref 11.1–15.9)
Immature Grans (Abs): 0 10*3/uL (ref 0.0–0.1)
Immature Granulocytes: 0 %
Lymphocytes Absolute: 1.5 10*3/uL (ref 0.7–3.1)
Lymphs: 25 %
MCH: 32.7 pg (ref 26.6–33.0)
MCHC: 33.2 g/dL (ref 31.5–35.7)
MCV: 99 fL — ABNORMAL HIGH (ref 79–97)
Monocytes Absolute: 0.6 10*3/uL (ref 0.1–0.9)
Monocytes: 10 %
Neutrophils Absolute: 3.8 10*3/uL (ref 1.4–7.0)
Neutrophils: 60 %
Platelets: 391 10*3/uL (ref 150–450)
RBC: 4.19 x10E6/uL (ref 3.77–5.28)
RDW: 13.4 % (ref 11.7–15.4)
WBC: 6.2 10*3/uL (ref 3.4–10.8)

## 2023-10-24 LAB — COMPREHENSIVE METABOLIC PANEL
ALT: 75 [IU]/L — ABNORMAL HIGH (ref 0–32)
AST: 38 [IU]/L (ref 0–40)
Albumin: 4.1 g/dL (ref 3.9–4.9)
Alkaline Phosphatase: 83 [IU]/L (ref 44–121)
BUN/Creatinine Ratio: 17 (ref 9–23)
BUN: 15 mg/dL (ref 6–24)
Bilirubin Total: 0.4 mg/dL (ref 0.0–1.2)
CO2: 20 mmol/L (ref 20–29)
Calcium: 9.4 mg/dL (ref 8.7–10.2)
Chloride: 101 mmol/L (ref 96–106)
Creatinine, Ser: 0.9 mg/dL (ref 0.57–1.00)
Globulin, Total: 2.3 g/dL (ref 1.5–4.5)
Glucose: 100 mg/dL — ABNORMAL HIGH (ref 70–99)
Potassium: 4.8 mmol/L (ref 3.5–5.2)
Sodium: 140 mmol/L (ref 134–144)
Total Protein: 6.4 g/dL (ref 6.0–8.5)
eGFR: 78 mL/min/{1.73_m2} (ref >59)

## 2023-10-24 LAB — VITAMIN D 25 HYDROXY (VIT D DEFICIENCY, FRACTURES): Vit D, 25-Hydroxy: 62.4 ng/mL (ref 30.0–100.0)

## 2023-10-24 LAB — TSH: TSH: 1.66 u[IU]/mL (ref 0.450–4.500)

## 2023-10-27 ENCOUNTER — Telehealth: Payer: Self-pay

## 2023-10-27 NOTE — Telephone Encounter (Signed)
-----   Message from Dulce Gibbs sent at 10/24/2023 12:40 PM EST ----- Please call patient. Blood work is reassuring. Blood count is normal. Thyroid is normal. Vitamin D  is in normal range. Kidney function and electrolytes normal. 1 liver test is elevated and the others are normal. Avoid drinking alcohol or taking products containing Tylenol . Thanks, Dulce Gibbs, PA-C

## 2023-10-27 NOTE — Telephone Encounter (Signed)
 Pt was called and is aware of results, DOB was confirmed.  ?

## 2023-10-28 ENCOUNTER — Ambulatory Visit
Admission: RE | Admit: 2023-10-28 | Discharge: 2023-10-28 | Disposition: A | Discharge status: Home / Self Care | Payer: BC Managed Care – PPO | Source: Ambulatory Visit | Attending: Physician Assistant | Admitting: Physician Assistant

## 2023-10-28 DIAGNOSIS — R5383 Other fatigue: Secondary | ICD-10-CM

## 2023-10-28 DIAGNOSIS — Z8673 Personal history of transient ischemic attack (TIA), and cerebral infarction without residual deficits: Secondary | ICD-10-CM

## 2023-10-28 DIAGNOSIS — D75839 Thrombocytosis, unspecified: Secondary | ICD-10-CM

## 2023-10-29 ENCOUNTER — Encounter: Payer: Self-pay | Admitting: Physician Assistant

## 2023-10-31 ENCOUNTER — Telehealth: Payer: Self-pay

## 2023-10-31 NOTE — Telephone Encounter (Signed)
Patient viewed results and Doctor comment through [facility]

## 2023-10-31 NOTE — Telephone Encounter (Signed)
-----   Message from Georgian Co sent at 10/29/2023  5:06 PM EST ----- Chest xray was normal.  Thanks, Georgian Co, PA-C

## 2023-11-03 ENCOUNTER — Other Ambulatory Visit: Payer: Self-pay

## 2023-11-04 ENCOUNTER — Other Ambulatory Visit: Payer: Self-pay

## 2023-11-04 ENCOUNTER — Other Ambulatory Visit (HOSPITAL_COMMUNITY): Payer: Self-pay

## 2023-11-04 ENCOUNTER — Other Ambulatory Visit: Payer: Self-pay | Admitting: Nurse Practitioner

## 2023-11-04 MED ORDER — FERROUS SULFATE 324 MG PO TBEC
324.0000 mg | DELAYED_RELEASE_TABLET | Freq: Every day | ORAL | 3 refills | Status: DC
  Filled 2023-11-04: qty 100, 100d supply, fill #0
  Filled 2024-04-13: qty 20, 20d supply, fill #1

## 2023-11-05 ENCOUNTER — Other Ambulatory Visit (HOSPITAL_COMMUNITY): Payer: Self-pay

## 2023-11-05 ENCOUNTER — Inpatient Hospital Stay: Payer: BC Managed Care – PPO | Admitting: Family Medicine

## 2023-11-05 ENCOUNTER — Other Ambulatory Visit: Payer: Self-pay

## 2023-11-10 ENCOUNTER — Encounter: Payer: Self-pay | Admitting: Nurse Practitioner

## 2023-11-10 ENCOUNTER — Inpatient Hospital Stay: Payer: BC Managed Care – PPO | Attending: Nurse Practitioner

## 2023-11-10 ENCOUNTER — Inpatient Hospital Stay (HOSPITAL_BASED_OUTPATIENT_CLINIC_OR_DEPARTMENT_OTHER): Payer: BC Managed Care – PPO | Admitting: Nurse Practitioner

## 2023-11-10 VITALS — BP 129/84 | HR 79 | Temp 98.2°F | Resp 18 | Ht 70.0 in | Wt 207.0 lb

## 2023-11-10 DIAGNOSIS — D75839 Thrombocytosis, unspecified: Secondary | ICD-10-CM | POA: Insufficient documentation

## 2023-11-10 DIAGNOSIS — D473 Essential (hemorrhagic) thrombocythemia: Secondary | ICD-10-CM | POA: Diagnosis present

## 2023-11-10 DIAGNOSIS — Z8673 Personal history of transient ischemic attack (TIA), and cerebral infarction without residual deficits: Secondary | ICD-10-CM | POA: Diagnosis not present

## 2023-11-10 DIAGNOSIS — D72829 Elevated white blood cell count, unspecified: Secondary | ICD-10-CM | POA: Diagnosis not present

## 2023-11-10 LAB — CBC WITH DIFFERENTIAL (CANCER CENTER ONLY)
Abs Immature Granulocytes: 0.02 10*3/uL (ref 0.00–0.07)
Basophils Absolute: 0 10*3/uL (ref 0.0–0.1)
Basophils Relative: 0 %
Eosinophils Absolute: 0.2 10*3/uL (ref 0.0–0.5)
Eosinophils Relative: 2 %
HCT: 38.7 % (ref 36.0–46.0)
Hemoglobin: 13.6 g/dL (ref 12.0–15.0)
Immature Granulocytes: 0 %
Lymphocytes Relative: 26 %
Lymphs Abs: 1.8 10*3/uL (ref 0.7–4.0)
MCH: 33.3 pg (ref 26.0–34.0)
MCHC: 35.1 g/dL (ref 30.0–36.0)
MCV: 94.9 fL (ref 80.0–100.0)
Monocytes Absolute: 0.5 10*3/uL (ref 0.1–1.0)
Monocytes Relative: 8 %
Neutro Abs: 4.5 10*3/uL (ref 1.7–7.7)
Neutrophils Relative %: 64 %
Platelet Count: 369 10*3/uL (ref 150–400)
RBC: 4.08 MIL/uL (ref 3.87–5.11)
RDW: 13.3 % (ref 11.5–15.5)
WBC Count: 7 10*3/uL (ref 4.0–10.5)
nRBC: 0 % (ref 0.0–0.2)

## 2023-11-10 LAB — CMP (CANCER CENTER ONLY)
ALT: 63 U/L — ABNORMAL HIGH (ref 0–44)
AST: 42 U/L — ABNORMAL HIGH (ref 15–41)
Albumin: 4.2 g/dL (ref 3.5–5.0)
Alkaline Phosphatase: 55 U/L (ref 38–126)
Anion gap: 5 (ref 5–15)
BUN: 11 mg/dL (ref 6–20)
CO2: 26 mmol/L (ref 22–32)
Calcium: 9.1 mg/dL (ref 8.9–10.3)
Chloride: 107 mmol/L (ref 98–111)
Creatinine: 0.99 mg/dL (ref 0.44–1.00)
GFR, Estimated: >60 mL/min (ref >60)
Glucose, Bld: 147 mg/dL — ABNORMAL HIGH (ref 70–99)
Potassium: 4.3 mmol/L (ref 3.5–5.1)
Sodium: 138 mmol/L (ref 135–145)
Total Bilirubin: 0.5 mg/dL (ref 0.0–1.2)
Total Protein: 7.1 g/dL (ref 6.5–8.1)

## 2023-11-10 NOTE — Progress Notes (Signed)
 Patient Care Team: [name], MD as PCP - General (Pulmonary Disease) Pollyann Samples, NP as Nurse Practitioner (Hematology and Oncology)   CHIEF COMPLAINT: Follow-up ET  CURRENT THERAPY: Hydrea 500 mg p.o. daily  INTERVAL HISTORY [name] returns for follow-up.  Last seen by Dr. Mosetta Putt in January, unfortunately a week later she became confused, called her daughter, and was brought to the hospital.  Workup showed small acute left cerebellar infarct and chronic bilateral cerebellar infarcts.  Dr. Mosetta Putt saw her in the hospital.  She was discharged on aspirin and Plavix stopped aspirin due to concern for kidney dysfunction.  Her follow-up with neurology was postponed due to the snow.  She has completely recovered but still gets periodic headaches and fatigues easily.  No other residual deficits.  She would like to resume working and exercise.   ROS  All other systems reviewed and negative  Past Medical History:  Diagnosis Date   Anxiety    Arthritis    Cancer (HCC) Feb26, 2024   Et   Depression    Drug reaction 10/22/2022   GERD (gastroesophageal reflux disease)    Hyperlipidemia    Hypertension    Stroke [facility]) 2016   "mild"   Suicidal overdose (HCC)      Past Surgical History:  Procedure Laterality Date   CHOLECYSTECTOMY     SIGMOIDOSCOPY     pt is unsure about this   TUBAL LIGATION     UPPER GASTROINTESTINAL ENDOSCOPY       Outpatient Encounter Medications as of 11/10/2023  Medication Sig Note   atorvastatin (LIPITOR) 40 MG tablet Take 1 tablet (40 mg total) by mouth daily.    clopidogrel (PLAVIX) 75 MG tablet Take 1 tablet (75 mg total) by mouth daily.    ferrous sulfate 324 MG TBEC Take 1 tablet (324 mg total) by mouth daily with breakfast.    hydroxyurea (HYDREA) 500 MG capsule Take 1 capsule (500 mg total) by mouth daily.    Multiple Vitamins-Minerals (HAIR SKIN AND NAILS FORMULA) TABS Take 1 tablet by mouth daily.    omeprazole (PRILOSEC) 20 MG capsule  Take 1 capsule (20 mg total) by mouth daily.    Vitamin D, Ergocalciferol, (DRISDOL) 1.25 MG (50000 UNIT) CAPS capsule Take 1 capsule (50,000 Units total) by mouth every 7 (seven) days.    ascorbic acid (VITAMIN C) 500 MG tablet Take 500 mg by mouth daily.    aspirin EC 81 MG tablet Take 1 tablet (81 mg total) by mouth daily. Swallow whole.    azelastine (ASTELIN) 0.1 % nasal spray Place 2 sprays into both nostrils 2 (two) times daily. Use in each nostril as directed    clotrimazole (GYNE-LOTRIMIN) 1 % vaginal cream Place 1 Applicatorful vaginally at bedtime.    olopatadine (PATANOL) 0.1 % ophthalmic solution Place 1 drop into both eyes 2 (two) times daily.    traZODone (DESYREL) 50 MG tablet Take 1-2 tablets (50-100 mg total) by mouth at bedtime as needed for sleep. 10/23/2023: PRN   Turmeric (QC TUMERIC COMPLEX) 500 MG CAPS Take 2 tablets by mouth daily.    No facility-administered encounter medications on file as of 11/10/2023.     Today's Vitals   11/10/23 1057 11/10/23 1101  BP:  129/84  Pulse:  79  Resp:  18  Temp:  98.2 F (36.8 C)  TempSrc:  Tympanic  SpO2:  100%  Weight:  207 lb (93.9 kg)  Height:  5\' 10"  (1.778 m)  PainSc: 0-No pain    Body mass index is 29.7 kg/m.   PHYSICAL EXAM GENERAL:alert, no distress and comfortable SKIN: no rash  EYES: sclera clear NECK: without mass LYMPH:  no palpable cervical or supraclavicular lymphadenopathy  LUNGS: clear with normal breathing effort HEART: regular rate & rhythm, no lower extremity edema ABDOMEN: abdomen soft, non-tender and normal bowel sounds NEURO: alert & oriented x 3 with fluent speech, no focal motor/sensory deficits   CBC    Component Value Date/Time   WBC 7.0 11/10/2023 1032   WBC 10.5 10/03/2023 1417   RBC 4.08 11/10/2023 1032   HGB 13.6 11/10/2023 1032   HGB 13.7 10/23/2023 0949   HCT 38.7 11/10/2023 1032   HCT 41.3 10/23/2023 0949   PLT 369 11/10/2023 1032   PLT 391 10/23/2023 0949   MCV 94.9  11/10/2023 1032   MCV 99 (H) 10/23/2023 0949   MCH 33.3 11/10/2023 1032   MCHC 35.1 11/10/2023 1032   RDW 13.3 11/10/2023 1032   RDW 13.4 10/23/2023 0949   LYMPHSABS 1.8 11/10/2023 1032   LYMPHSABS 1.5 10/23/2023 0949   MONOABS 0.5 11/10/2023 1032   EOSABS 0.2 11/10/2023 1032   EOSABS 0.2 10/23/2023 0949   BASOSABS 0.0 11/10/2023 1032   BASOSABS 0.0 10/23/2023 0949     CMP     Component Value Date/Time   NA 138 11/10/2023 1032   NA 140 10/23/2023 0949   K 4.3 11/10/2023 1032   CL 107 11/10/2023 1032   CO2 26 11/10/2023 1032   GLUCOSE 147 (H) 11/10/2023 1032   BUN 11 11/10/2023 1032   BUN 15 10/23/2023 0949   CREATININE 0.99 11/10/2023 1032   CALCIUM 9.1 11/10/2023 1032   PROT 7.1 11/10/2023 1032   PROT 6.4 10/23/2023 0949   ALBUMIN 4.2 11/10/2023 1032   ALBUMIN 4.1 10/23/2023 0949   AST 42 (H) 11/10/2023 1032   ALT 63 (H) 11/10/2023 1032   ALKPHOS 55 11/10/2023 1032   BILITOT 0.5 11/10/2023 1032   GFRNONAA >60 11/10/2023 1032   GFRAA >60 06/07/2020 1541     ASSESSMENT & PLAN:48 year old female   MPN/Essential Thrombocythemia (ET), 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 WBC range 11.1 - 16.2, with predominant neutrophils, and platelet range 429 - 611  -10/30/22 iron panel was normal. 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 She has no other abd imaging in our system but reportedly had US done in [place] earlier in 2024; I don't have access and the report is in Spanish which she can't translate at this time. We plan to monitor with repeat ABD [place] in 6 -12 months - due now -She began Hydrea 500 mg starting 11/13/22, currently once daily -PLT normal lately,  tolerating well. Continue   2. Atypical chest pain, h/o CVA -She had a stroke in 2016 at age 48, details are unclear whether hemorrhagic vs ischemic. She was on aspirin for 6 months then stopped. No residual deficits -She reports intermittent left chest pain, non-exertional -She went to ED in 06/2022 for this pain, work up was unremarkable.  -10/30/22 exam is benign -F/up PCP  -We discussed ET may have been the cause of her stroke, given the high risk of thrombosis with  ET -She continues to have intermittent chest pain, seen in ED 11/24/22, work up was unremarkable. I referred her to cardiology -She had second stroke in 09/2023, she has recovered except mild residual headaches. F/up neurology 3/12. She is not taking aspirin and will run out of plavix before the appt, I will cc my note -Not felt to be related to ET and hypercoagulable panel was negative/normal, antiphospholipid syndrome is ruled out   3. Age-appropriate health maintenance -I encouraged her to continue age appropriate health maintenance and cancer screenings, avoid smoking/alcohol, and exercise.  -She is overdue for first screening colonoscopy. Per PCP   PLAN: -Hospital course, hypercoagulable panel (negative), and today's labs reviewed -Continue Hydrea 500 mg p.o. once daily for ET -Not taking aspirin and will run out of Plavix prior to neurology follow-up on 3/12, I will CC my note -Repeat abd [place] to monitor likely benign splenic lesion, will call with results  -Lab q3 months, follow-up in 6 months, or sooner if needed -Case discussed with Dr. Mosetta Putt   Orders Placed This Encounter  Procedures   US Abdomen Complete    Standing Status:   Future    Expected Date:   11/17/2023    Expiration Date:   11/09/2024    Reason for Exam (SYMPTOM  OR DIAGNOSIS REQUIRED):   ET, h/o splenic lesion on [place] 10/2022, transaminitis    Preferred imaging location?:   [facility]      All questions were answered. The patient knows to  call the clinic with any problems, questions or concerns. No barriers to learning were detected. I spent 20 minutes counseling the patient face to face. The total time spent in the appointment was 30 minutes and more than 50% was on counseling, review of test results, and coordination of care.   Santiago Glad, NP-C 11/10/2023

## 2023-11-17 ENCOUNTER — Ambulatory Visit (HOSPITAL_COMMUNITY)
Admission: RE | Admit: 2023-11-17 | Discharge: 2023-11-17 | Disposition: A | Discharge status: Home / Self Care | Payer: BC Managed Care – PPO | Source: Ambulatory Visit | Attending: Nurse Practitioner | Admitting: Nurse Practitioner

## 2023-11-17 ENCOUNTER — Other Ambulatory Visit: Payer: Self-pay

## 2023-11-17 DIAGNOSIS — D473 Essential (hemorrhagic) thrombocythemia: Secondary | ICD-10-CM | POA: Insufficient documentation

## 2023-11-24 ENCOUNTER — Telehealth: Payer: Self-pay

## 2023-11-24 NOTE — Telephone Encounter (Signed)
 Attempted to reach patient via telephone call, per Lacie Burton,NP. LVM stating, small splenic lesion remains stable, likely benign, and to c/b if she had further questions or concerns.

## 2023-11-24 NOTE — Patient Instructions (Incomplete)

## 2023-11-24 NOTE — Progress Notes (Unsigned)
 [facility] [address] [place]. [place] [address] [phone]       HOSPITAL FOLLOW UP NOTE  [name] Date of Birth:  01-24-1976 Medical Record Number:  [id number]   Reason for Referral:  hospital stroke follow up    SUBJECTIVE:   CHIEF COMPLAINT:  No chief complaint on file.   HPI:   Alicia Robinson is a 48 y.o. who  has a past medical history of Anxiety, Arthritis, Cancer (HCC) (Feb26, 2024), Depression, Drug reaction (10/22/2022), GERD (gastroesophageal reflux disease), Hyperlipidemia, Hypertension, Stroke (HCC) (2016), and Suicidal overdose (HCC).  Patient presented on 10/03/2023 with acute onset AMS, confusion and short term memory loss. TGA suspected. MRI showed acute left cerebellar infarct as well as chronic bilateral cerebellar infarcts. 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 EEG unremarkable. Echo stable. Diagnosed with essential thrombocythemia on Hydrea. She was started on DAPT for 3 weeks then asa alone. LDL 72. Atorvastatin was increased to 40mg  daily. A1C 5.4. No therapy needs. She was discharged home 10/05/2023. Personally reviewed hospitalization pertinent progress notes, lab work and imaging.  Evaluated by Dr Roda Shutters.   Since discharge,   BP Atorvastatin memory  Cardiac monitor?  PCP appt scheduled 12/22/2023.  Hematology appt 01/26/2024.    PERTINENT IMAGING/LABS  CT head No acute abnormality.  CTA head & neck LVO or hemodynamically significant stenosis MRI small acute left cerebellar infarct, chronic bilateral cerebellar infarcts and mild chronic small vessel ischemic disease 2D Echo EF 60 to 65%, normal left atrial size, no atrial level shunt TCD bubble study no DVT EEG negative for seizure Hypercoagulable panel negative Recommend 30-day cardiac monitor on discharge   A1C Lab Results  Component Value Date   HGBA1C 5.4 10/03/2023    Lipid Panel     Component Value Date/Time   CHOL 144  10/04/2023 0508   CHOL 159 03/11/2023 1108   TRIG 64 10/04/2023 0508   HDL 59 10/04/2023 0508   HDL 69 03/11/2023 1108   CHOLHDL 2.4 10/04/2023 0508   VLDL 13 10/04/2023 0508   LDLCALC 72 10/04/2023 0508   LDLCALC 79 03/11/2023 1108   LABVLDL 11 03/11/2023 1108      ROS:   14 system review of systems performed and negative with exception of those listed in HPI  PMH:  Past Medical History:  Diagnosis Date   Anxiety    Arthritis    Cancer (HCC) Feb26, 2024   Et   Depression    Drug reaction 10/22/2022   GERD (gastroesophageal reflux disease)    Hyperlipidemia    Hypertension    Stroke (HCC) 2016   "mild"   Suicidal overdose (HCC)     PSH:  Past Surgical History:  Procedure Laterality Date   CHOLECYSTECTOMY     SIGMOIDOSCOPY     pt is unsure about this   TUBAL LIGATION     UPPER GASTROINTESTINAL ENDOSCOPY      Social History:  Social History   Socioeconomic History   Marital status: Single    Spouse name: Not on file   Number of children: 3   Years of education: Not on file   Highest education level: Associate degree: occupational, Scientist, product/process development, or vocational program  Occupational History   Not on file  Tobacco Use   Smoking status: Never   Smokeless tobacco: Never  Vaping Use   Vaping status: Never Used  Substance and Sexual Activity   Alcohol use: Not Currently  Comment: weekends, socially   Drug use: Not Currently    Types: Marijuana   Sexual activity: Not Currently    Birth control/protection: Abstinence  Other Topics Concern   Not on file  Social History Narrative   Not on file   Social Drivers of Health   Financial Resource Strain: Low Risk  (09/23/2023)   Overall Financial Resource Strain (CARDIA)    Difficulty of Paying Living Expenses: Not very hard  Food Insecurity: No Food Insecurity (10/04/2023)   Hunger Vital Sign    Worried About Running Out of Food in the Last Year: Never true    Ran Out of Food in the Last Year: Never true   Transportation Needs: No Transportation Needs (10/04/2023)   PRAPARE - Administrator, Civil Service (Medical): No    Lack of Transportation (Non-Medical): No  Physical Activity: Sufficiently Active (09/23/2023)   Exercise Vital Sign    Days of Exercise per Week: 5 days    Minutes of Exercise per Session: 90 min  Stress: No Stress Concern Present (09/23/2023)   [employer] of Occupational Health - Occupational Stress Questionnaire    Feeling of Stress : Not at all  Social Connections: Moderately Integrated (09/23/2023)   Social Connection and Isolation Panel [NHANES]    Frequency of Communication with Friends and Family: Three times a week    Frequency of Social Gatherings with Friends and Family: Once a week    Attends Religious Services: More than 4 times per year    Active Member of [employer] or Organizations: Yes    Attends Engineer, structural: More than 4 times per year    Marital Status: Never married  Intimate Partner Violence: Not At Risk (10/04/2023)   Humiliation, Afraid, Rape, and Kick questionnaire    Fear of Current or Ex-Partner: No    Emotionally Abused: No    Physically Abused: No    Sexually Abused: No    Family History:  Family History  Problem Relation Age of Onset   Hypertension Mother    Diabetes Mother    Arthritis Mother    Depression Mother    Hypertension Father    Diabetes Father    Alcohol abuse Father    Diabetes Maternal Grandmother    Hypertension Maternal Grandmother    Miscarriages / Stillbirths Sister    Colon cancer Neg Hx    Esophageal cancer Neg Hx    Rectal cancer Neg Hx    Stomach cancer Neg Hx     Medications:   Current Outpatient Medications on File Prior to Visit  Medication Sig Dispense Refill   atorvastatin (LIPITOR) 40 MG tablet Take 1 tablet (40 mg total) by mouth daily. 30 tablet 1   clopidogrel (PLAVIX) 75 MG tablet Take 1 tablet (75 mg total) by mouth daily. 30 tablet 0   ferrous sulfate 324 MG TBEC  Take 1 tablet (324 mg total) by mouth daily with breakfast. 30 tablet 3   hydroxyurea (HYDREA) 500 MG capsule Take 1 capsule (500 mg total) by mouth daily. 30 capsule 2   Multiple Vitamins-Minerals (HAIR SKIN AND NAILS FORMULA) TABS Take 1 tablet by mouth daily.     omeprazole (PRILOSEC) 20 MG capsule Take 1 capsule (20 mg total) by mouth daily. 90 capsule 2   traZODone (DESYREL) 50 MG tablet Take 1-2 tablets (50-100 mg total) by mouth at bedtime as needed for sleep. 60 tablet 3   Vitamin D, Ergocalciferol, (DRISDOL) 1.25 MG (50000 UNIT) CAPS  capsule Take 1 capsule (50,000 Units total) by mouth every 7 (seven) days. 14 capsule 3   No current facility-administered medications on file prior to visit.    Allergies:   Allergies  Allergen Reactions   Bupropion Other (See Comments)    POLYURIAN   Aspirin     Kidney Damage   Nsaids     Renal insufficiency- no per pt      OBJECTIVE:  Physical Exam  There were no vitals filed for this visit. There is no height or weight on file to calculate BMI. No results found.     10/23/2023    8:59 AM  Depression screen PHQ 2/9  Decreased Interest 3  Down, Depressed, Hopeless 1  PHQ - 2 Score 4  Altered sleeping 1  Tired, decreased energy 1  Change in appetite 0  Feeling bad or failure about yourself  0  Trouble concentrating 0  Moving slowly or fidgety/restless 0  Suicidal thoughts 0  PHQ-9 Score 6  Difficult doing work/chores Somewhat difficult     General: well developed, well nourished, seated, in no evident distress Head: head normocephalic and atraumatic.   Neck: supple with no carotid or supraclavicular bruits Cardiovascular: regular rate and rhythm, no murmurs Musculoskeletal: no deformity Skin:  no rash/petichiae Vascular:  Normal pulses all extremities   Neurologic Exam Mental Status: Awake and fully alert.  Fluent speech and language.  Oriented to place and time. Recent and remote memory intact. Attention span,  concentration and fund of knowledge appropriate. Mood and affect appropriate.  Cranial Nerves: Fundoscopic exam reveals sharp disc margins. Pupils equal, briskly reactive to light. Extraocular movements full without nystagmus. Visual fields full to confrontation. Hearing intact. Facial sensation intact. Face, tongue, palate moves normally and symmetrically.  Motor: Normal bulk and tone. Normal strength in all tested extremity muscles Sensory.: intact to touch , pinprick , position and vibratory sensation.  Coordination: Rapid alternating movements normal in all extremities. Finger-to-nose and heel-to-shin performed accurately bilaterally. Gait and Station: Arises from chair without difficulty. Stance is normal. Gait demonstrates normal stride length and balance with ***. Tandem walk and heel toe ***.  Reflexes: 1+ and symmetric.    NIHSS  *** Modified Rankin  ***    ASSESSMENT: Alicia Robinson is a 48 y.o. year old female ***. Vascular risk factors include hx stroke, HLD, obesity, thrombocytopenia.      PLAN:  Acute Ischemic Infarct:  left cerebellar infarct, as well as suspicion for TGA. Etiology: Likely small vessel disease : Residual deficit: ***. Continue aspirin 81 mg daily  and atorvastatin 40mg  daily for secondary stroke prevention.  Discussed secondary stroke prevention measures and importance of close PCP follow up for aggressive stroke risk factor management. I have gone over the pathophysiology of stroke, warning signs and symptoms, risk factors and their management in some detail with instructions to go to the closest emergency room for symptoms of concern. HTN: BP goal <130/90.  Stable on no HTN agents. Continue to monitor per PCP HLD: LDL goal <70. Recent LDL 72. Continue atorvastatin 40mg  daily per PCP.  DMII: A1c goal<7.0. Recent A1c 5.4. Not diabetic. Continue to monitor per PCP.  Essential thrombocythemia: diagnosed 2018. JAK+ 2024, continue hydrea. Continue close follow up  with hematology.  Obesity: BMI continue close follow up with PCP for weight management.    Follow up in *** or call earlier if needed   CC:  [facility] provider: Dr. Pearlean Brownie PCP: [name], MD    I  spent *** minutes of face-to-face and non-face-to-face time with patient.  This included previsit chart review including review of recent hospitalization, lab review, study review, order entry, electronic health record documentation, patient education regarding recent stroke including etiology, secondary stroke prevention measures and importance of managing stroke risk factors, residual deficits and typical recovery time and answered all other questions to patient satisfaction   Shawnie Dapper, [facility]  [facility] [address] [place], [place] [address]  Phone [phone] Fax [phone] Note: This document was prepared with digital dictation and possible smart phrase technology. Any transcriptional errors that result from this process are unintentional.

## 2023-11-26 ENCOUNTER — Ambulatory Visit (INDEPENDENT_AMBULATORY_CARE_PROVIDER_SITE_OTHER): Payer: BC Managed Care – PPO | Admitting: Family Medicine

## 2023-11-26 ENCOUNTER — Encounter: Payer: Self-pay | Admitting: Family Medicine

## 2023-11-26 VITALS — BP 142/87 | HR 80 | Ht 70.0 in | Wt 205.0 lb

## 2023-11-26 DIAGNOSIS — I639 Cerebral infarction, unspecified: Secondary | ICD-10-CM | POA: Diagnosis not present

## 2023-11-26 NOTE — Progress Notes (Signed)
 FMLA Forms completed as requested. Fax transmission confirmation received. Copy of forms mailed to Patient as requested. No other needs or concerns noted at this time.

## 2023-11-28 ENCOUNTER — Other Ambulatory Visit: Payer: Self-pay | Admitting: Physician Assistant

## 2023-11-28 DIAGNOSIS — Z8673 Personal history of transient ischemic attack (TIA), and cerebral infarction without residual deficits: Secondary | ICD-10-CM

## 2023-11-28 MED ORDER — CLOPIDOGREL BISULFATE 75 MG PO TABS
75.0000 mg | ORAL_TABLET | Freq: Every day | ORAL | 1 refills | Status: DC
  Filled 2023-11-28: qty 30, 30d supply, fill #0
  Filled 2023-12-27: qty 30, 30d supply, fill #1

## 2023-11-29 ENCOUNTER — Other Ambulatory Visit (HOSPITAL_COMMUNITY): Payer: Self-pay

## 2023-12-03 ENCOUNTER — Other Ambulatory Visit: Payer: Self-pay

## 2023-12-19 ENCOUNTER — Other Ambulatory Visit (HOSPITAL_COMMUNITY): Payer: Self-pay

## 2023-12-22 ENCOUNTER — Other Ambulatory Visit: Payer: BC Managed Care – PPO

## 2023-12-22 ENCOUNTER — Ambulatory Visit: Payer: BC Managed Care – PPO | Attending: Family Medicine | Admitting: Family Medicine

## 2023-12-22 ENCOUNTER — Encounter: Payer: Self-pay | Admitting: Family Medicine

## 2023-12-22 ENCOUNTER — Ambulatory Visit: Payer: BC Managed Care – PPO | Admitting: Family Medicine

## 2023-12-22 ENCOUNTER — Other Ambulatory Visit (HOSPITAL_COMMUNITY): Payer: Self-pay

## 2023-12-22 VITALS — BP 135/79 | HR 81 | Ht 70.0 in | Wt 204.4 lb

## 2023-12-22 DIAGNOSIS — D75839 Thrombocytosis, unspecified: Secondary | ICD-10-CM

## 2023-12-22 DIAGNOSIS — M25562 Pain in left knee: Secondary | ICD-10-CM

## 2023-12-22 DIAGNOSIS — K219 Gastro-esophageal reflux disease without esophagitis: Secondary | ICD-10-CM

## 2023-12-22 DIAGNOSIS — G8929 Other chronic pain: Secondary | ICD-10-CM

## 2023-12-22 DIAGNOSIS — I10 Essential (primary) hypertension: Secondary | ICD-10-CM

## 2023-12-22 DIAGNOSIS — E785 Hyperlipidemia, unspecified: Secondary | ICD-10-CM

## 2023-12-22 DIAGNOSIS — Z8673 Personal history of transient ischemic attack (TIA), and cerebral infarction without residual deficits: Secondary | ICD-10-CM

## 2023-12-22 MED ORDER — OMEPRAZOLE 20 MG PO CPDR
20.0000 mg | DELAYED_RELEASE_CAPSULE | Freq: Every day | ORAL | 1 refills | Status: AC
  Filled 2023-12-22 – 2023-12-27 (×2): qty 30, 30d supply, fill #0
  Filled 2024-03-21: qty 30, 30d supply, fill #1
  Filled 2024-06-02 – 2024-06-28 (×2): qty 30, 30d supply, fill #2
  Filled 2024-07-25: qty 30, 30d supply, fill #3
  Filled 2024-09-27: qty 30, 30d supply, fill #4

## 2023-12-22 MED ORDER — ATORVASTATIN CALCIUM 40 MG PO TABS
40.0000 mg | ORAL_TABLET | Freq: Every day | ORAL | 1 refills | Status: DC
  Filled 2023-12-22 – 2023-12-27 (×2): qty 30, 30d supply, fill #0

## 2023-12-22 MED ORDER — DICLOFENAC SODIUM 1 % EX GEL
4.0000 g | Freq: Four times a day (QID) | CUTANEOUS | 1 refills | Status: DC
  Filled 2023-12-22: qty 100, 13d supply, fill #0

## 2023-12-22 NOTE — Patient Instructions (Signed)
 VISIT SUMMARY:  Alicia Robinson, you had a routine follow-up appointment today to manage your ongoing health conditions, including type 2 diabetes, hyperlipidemia, hypertension, and essential thrombocythemia. We also discussed your history of stroke and current medications. Additionally, you mentioned experiencing intermittent knee pain.  YOUR PLAN:  -STROKE: You had an acute ischemic stroke in January 2020 but have fully recovered. To prevent another stroke, you should continue taking Plavix 75 mg daily. Avoid using ibuprofen or Aleve for pain relief due to the risk of bleeding; use Tylenol instead.  -HYPERLIPIDEMIA: Hyperlipidemia means you have high levels of fats in your blood, which can increase the risk of stroke. Continue taking atorvastatin 40 mg daily to manage this condition and prevent another stroke. Your prescription for atorvastatin has been refilled.  -ESSENTIAL THROMBOCYTHEMIA: Essential thrombocythemia is a condition where your body produces too many platelets, which can cause blood clots. Continue taking hydroxyurea as prescribed to manage this condition.  -GASTROESOPHAGEAL REFLUX DISEASE (GERD): GERD is a condition where stomach acid frequently flows back into the tube connecting your mouth and stomach. Continue taking omeprazole as prescribed to manage this condition. Your prescription for omeprazole has been refilled.  -KNEE PAIN: You have intermittent pain in your left knee, possibly due to arthritis. Since you cannot take NSAIDs, use Voltaren gel as a topical anti-inflammatory treatment. You may also use a knee brace for support and apply ice to reduce inflammation.  INSTRUCTIONS:  No immediate follow-up appointments or tests are required. Your recent blood work was completed in February. Plan for a follow-up appointment in six months. Your medication refills have been sent to [facility].

## 2023-12-22 NOTE — Progress Notes (Signed)
 Subjective:  Patient ID: Alicia Robinson, female    DOB: 01/31/1976  Age: 48 y.o. MRN: [id number]  CC: Medical Management of Chronic Issues     Discussed the use of AI scribe software for clinical note transcription with the patient, who gave verbal consent to proceed.  History of Present Illness Alicia Robinson, a 48 year old female with a history of hyperlipidemia, essential thrombocythemia, history of ischemic stroke in 09/2023 presents for a routine follow-up. She had an acute ischemic stroke in January 2025 secondary to cerebellar infarcts but has since fully recovered with no residual deficits. She is currently on Plavix for stroke prevention, which she tolerates well. She reports an allergy to aspirin, which causes 'kidney pain'.  She has followed up with neurology since then. She is also on hydroxyurea for her thrombocythemia.  She has been wearing a cardiac event  monitor for the past month, with data being sent directly to her cardiologist.   She also reports intermittent knee pain, which comes and goes. The pain is not associated with changes in weather or specific activities. She has a history of arthritis in her right knee, but currently, it is her left knee that is causing discomfort.    Past Medical History:  Diagnosis Date   Anxiety    Arthritis    Cancer (HCC) Feb26, 2024   Et   Depression    Drug reaction 10/22/2022   GERD (gastroesophageal reflux disease)    Hyperlipidemia    Hypertension    Stroke [facility]) 2016   "mild"   Suicidal overdose (HCC)     Past Surgical History:  Procedure Laterality Date   CHOLECYSTECTOMY     SIGMOIDOSCOPY     pt is unsure about this   TUBAL LIGATION     UPPER GASTROINTESTINAL ENDOSCOPY      Family History  Problem Relation Age of Onset   Hypertension Mother    Diabetes Mother    Arthritis Mother    Depression Mother    Hypertension Father    Diabetes Father    Alcohol abuse Father    Diabetes Maternal Grandmother     Hypertension Maternal Grandmother    Miscarriages / Stillbirths Sister    Colon cancer Neg Hx    Esophageal cancer Neg Hx    Rectal cancer Neg Hx    Stomach cancer Neg Hx     Social History   Socioeconomic History   Marital status: Single    Spouse name: Not on file   Number of children: 3   Years of education: Not on file   Highest education level: Associate degree: occupational, Scientist, product/process development, or vocational program  Occupational History   Not on file  Tobacco Use   Smoking status: Never   Smokeless tobacco: Never  Vaping Use   Vaping status: Never Used  Substance and Sexual Activity   Alcohol use: Not Currently    Comment: weekends, socially   Drug use: Not Currently    Types: Marijuana   Sexual activity: Not Currently    Birth control/protection: Abstinence  Other Topics Concern   Not on file  Social History Narrative   Not on file   Social Drivers of Health   Financial Resource Strain: Low Risk  (09/23/2023)   Overall Financial Resource Strain (CARDIA)    Difficulty of Paying Living Expenses: Not very hard  Food Insecurity: No Food Insecurity (10/04/2023)   Hunger Vital Sign    Worried About Running Out of Food in the Last Year:  Never true    Ran Out of Food in the Last Year: Never true  Transportation Needs: No Transportation Needs (10/04/2023)   PRAPARE - Administrator, Civil Service (Medical): No    Lack of Transportation (Non-Medical): No  Physical Activity: Sufficiently Active (09/23/2023)   Exercise Vital Sign    Days of Exercise per Week: 5 days    Minutes of Exercise per Session: 90 min  Stress: No Stress Concern Present (09/23/2023)   [employer] of Occupational Health - Occupational Stress Questionnaire    Feeling of Stress : Not at all  Social Connections: Moderately Integrated (09/23/2023)   Social Connection and Isolation Panel [NHANES]    Frequency of Communication with Friends and Family: Three times a week    Frequency of Social  Gatherings with Friends and Family: Once a week    Attends Religious Services: More than 4 times per year    Active Member of [employer] or Organizations: Yes    Attends Engineer, structural: More than 4 times per year    Marital Status: Never married    Allergies  Allergen Reactions   Bupropion Other (See Comments)    POLYURIAN   Aspirin     Kidney Damage   Nsaids     Renal insufficiency- no per pt    Outpatient Medications Prior to Visit  Medication Sig Dispense Refill   atorvastatin (LIPITOR) 40 MG tablet Take 1 tablet (40 mg total) by mouth daily. 30 tablet 1   clopidogrel (PLAVIX) 75 MG tablet Take 1 tablet (75 mg total) by mouth daily. 90 tablet 1   ferrous sulfate 324 MG TBEC Take 1 tablet (324 mg total) by mouth daily with breakfast. 30 tablet 3   hydroxyurea (HYDREA) 500 MG capsule Take 1 capsule (500 mg total) by mouth daily. 30 capsule 2   Multiple Vitamins-Minerals (HAIR SKIN AND NAILS FORMULA) TABS Take 1 tablet by mouth daily.     omeprazole (PRILOSEC) 20 MG capsule Take 1 capsule (20 mg total) by mouth daily. 90 capsule 2   Vitamin D, Ergocalciferol, (DRISDOL) 1.25 MG (50000 UNIT) CAPS capsule Take 1 capsule (50,000 Units total) by mouth every 7 (seven) days. 14 capsule 3   traZODone (DESYREL) 50 MG tablet Take 1-2 tablets (50-100 mg total) by mouth at bedtime as needed for sleep. 60 tablet 3   No facility-administered medications prior to visit.     ROS Review of Systems  Constitutional:  Negative for activity change and appetite change.  HENT:  Negative for sinus pressure and sore throat.   Respiratory:  Negative for chest tightness, shortness of breath and wheezing.   Cardiovascular:  Negative for chest pain and palpitations.  Gastrointestinal:  Negative for abdominal distention, abdominal pain and constipation.  Genitourinary: Negative.   Musculoskeletal:        See HPI  Psychiatric/Behavioral:  Negative for behavioral problems and dysphoric mood.      Objective:  BP 135/79   Pulse 81   Ht 5\' 10"  (1.778 m)   Wt 204 lb 6.4 oz (92.7 kg)   LMP 10/24/2023 (Approximate)   SpO2 99%   BMI 29.33 kg/m      12/22/2023    3:58 PM 11/26/2023    8:35 AM 11/10/2023   11:01 AM  BP/Weight  Systolic BP 135 142 129  Diastolic BP 79 87 84  Wt. (Lbs) 204.4 205 207  BMI 29.33 kg/m2 29.41 kg/m2 29.7 kg/m2      Physical Exam  Constitutional:      Appearance: She is well-developed.  Cardiovascular:     Rate and Rhythm: Normal rate.     Heart sounds: Normal heart sounds. No murmur heard. Pulmonary:     Effort: Pulmonary effort is normal.     Breath sounds: Normal breath sounds. No wheezing or rales.  Chest:     Chest wall: No tenderness.  Abdominal:     General: Bowel sounds are normal. There is no distension.     Palpations: Abdomen is soft. There is no mass.     Tenderness: There is no abdominal tenderness.  Musculoskeletal:     Right lower leg: No edema.     Left lower leg: No edema.     Comments: Normal appearance of both knees Slight TTP of medial aspect of left knee and on ROM which is associated with crepitus.  Neurological:     Mental Status: She is alert and oriented to person, place, and time.  Psychiatric:        Mood and Affect: Mood normal.        Latest Ref Rng & Units 11/10/2023   10:32 AM 10/23/2023    9:49 AM 10/03/2023    2:21 PM  CMP  Glucose 70 - 99 mg/dL 161  096  95   BUN 6 - 20 mg/dL 11  15  24    Creatinine 0.44 - 1.00 mg/dL 0.45  4.09  8.11   Sodium 135 - 145 mmol/L 138  140  139   Potassium 3.5 - 5.1 mmol/L 4.3  4.8  4.7   Chloride 98 - 111 mmol/L 107  101  104   CO2 22 - 32 mmol/L 26  20    Calcium 8.9 - 10.3 mg/dL 9.1  9.4    Total Protein 6.5 - 8.1 g/dL 7.1  6.4    Total Bilirubin 0.0 - 1.2 mg/dL 0.5  0.4    Alkaline Phos 38 - 126 U/L 55  83    AST 15 - 41 U/L 42  38    ALT 0 - 44 U/L 63  75      Lipid Panel     Component Value Date/Time   CHOL 144 10/04/2023 0508   CHOL 159 03/11/2023  1108   TRIG 64 10/04/2023 0508   HDL 59 10/04/2023 0508   HDL 69 03/11/2023 1108   CHOLHDL 2.4 10/04/2023 0508   VLDL 13 10/04/2023 0508   LDLCALC 72 10/04/2023 0508   LDLCALC 79 03/11/2023 1108    CBC    Component Value Date/Time   WBC 7.0 11/10/2023 1032   WBC 10.5 10/03/2023 1417   RBC 4.08 11/10/2023 1032   HGB 13.6 11/10/2023 1032   HGB 13.7 10/23/2023 0949   HCT 38.7 11/10/2023 1032   HCT 41.3 10/23/2023 0949   PLT 369 11/10/2023 1032   PLT 391 10/23/2023 0949   MCV 94.9 11/10/2023 1032   MCV 99 (H) 10/23/2023 0949   MCH 33.3 11/10/2023 1032   MCHC 35.1 11/10/2023 1032   RDW 13.3 11/10/2023 1032   RDW 13.4 10/23/2023 0949   LYMPHSABS 1.8 11/10/2023 1032   LYMPHSABS 1.5 10/23/2023 0949   MONOABS 0.5 11/10/2023 1032   EOSABS 0.2 11/10/2023 1032   EOSABS 0.2 10/23/2023 0949   BASOSABS 0.0 11/10/2023 1032   BASOSABS 0.0 10/23/2023 0949    Lab Results  Component Value Date   HGBA1C 5.4 10/03/2023       Assessment & Plan History of Stroke  Acute ischemic stroke in January 2025 with no residual deficits. Plavix necessary due to thrombocytosis and aspirin allergy. Advised against ibuprofen or Aleve due to bleeding risk. - Continue Plavix 75 mg daily. - Avoid ibuprofen and Aleve; use Tylenol for pain.  Hyperlipidemia Managed with atorvastatin 40 mg due to previous stroke. Discussed maintaining moderate to high-intensity statin therapy post-stroke to prevent recurrence. - Continue atorvastatin 40 mg daily. - Refill atorvastatin prescription.  Essential Thrombocythemia Managed with hydroxyurea. -Continue to follow up with Oncology  Gastroesophageal Reflux Disease (GERD) Managed with omeprazole. - Refill omeprazole prescription.  Left  Knee Pain Intermittent left knee pain, previous arthritis in right knee. Pain occurs spontaneously without swelling. Advised non-pharmacological management due to NSAID contraindications. - Recommend Voltaren gel for topical  anti-inflammatory treatment. - Suggest use of a knee brace for support. - Advise application of ice for inflammation.  Follow-up No immediate follow-up appointments or tests required. Recent blood work completed in February. - Plan for follow-up in six months. - Send medication refills to [facility].      No orders of the defined types were placed in this encounter.   Follow-up: No follow-ups on file.       Hoy Register, MD, FAAFP. [facility] [place], [place] [phone]   12/22/2023, 4:06 PM

## 2023-12-23 ENCOUNTER — Other Ambulatory Visit: Payer: Self-pay

## 2023-12-26 ENCOUNTER — Other Ambulatory Visit: Payer: Self-pay

## 2023-12-27 ENCOUNTER — Other Ambulatory Visit (HOSPITAL_COMMUNITY): Payer: Self-pay

## 2023-12-27 ENCOUNTER — Other Ambulatory Visit (HOSPITAL_BASED_OUTPATIENT_CLINIC_OR_DEPARTMENT_OTHER): Payer: Self-pay

## 2024-01-06 ENCOUNTER — Ambulatory Visit: Attending: Family Medicine

## 2024-01-06 DIAGNOSIS — I639 Cerebral infarction, unspecified: Secondary | ICD-10-CM

## 2024-01-07 DIAGNOSIS — I639 Cerebral infarction, unspecified: Secondary | ICD-10-CM

## 2024-01-08 ENCOUNTER — Encounter: Payer: Self-pay | Admitting: Family Medicine

## 2024-01-17 ENCOUNTER — Other Ambulatory Visit (HOSPITAL_COMMUNITY): Payer: Self-pay

## 2024-01-19 ENCOUNTER — Telehealth: Payer: Self-pay | Admitting: Nurse Practitioner

## 2024-01-19 ENCOUNTER — Inpatient Hospital Stay: Attending: Nurse Practitioner

## 2024-01-19 ENCOUNTER — Other Ambulatory Visit: Payer: Self-pay

## 2024-01-19 DIAGNOSIS — D473 Essential (hemorrhagic) thrombocythemia: Secondary | ICD-10-CM | POA: Diagnosis present

## 2024-01-19 DIAGNOSIS — D75839 Thrombocytosis, unspecified: Secondary | ICD-10-CM

## 2024-01-19 LAB — CBC WITH DIFFERENTIAL (CANCER CENTER ONLY)
Abs Immature Granulocytes: 0.03 10*3/uL (ref 0.00–0.07)
Basophils Absolute: 0.1 10*3/uL (ref 0.0–0.1)
Basophils Relative: 0 %
Eosinophils Absolute: 0.2 10*3/uL (ref 0.0–0.5)
Eosinophils Relative: 2 %
HCT: 37.8 % (ref 36.0–46.0)
Hemoglobin: 13.3 g/dL (ref 12.0–15.0)
Immature Granulocytes: 0 %
Lymphocytes Relative: 24 %
Lymphs Abs: 2.8 10*3/uL (ref 0.7–4.0)
MCH: 33.3 pg (ref 26.0–34.0)
MCHC: 35.2 g/dL (ref 30.0–36.0)
MCV: 94.7 fL (ref 80.0–100.0)
Monocytes Absolute: 1.1 10*3/uL — ABNORMAL HIGH (ref 0.1–1.0)
Monocytes Relative: 9 %
Neutro Abs: 7.3 10*3/uL (ref 1.7–7.7)
Neutrophils Relative %: 65 %
Platelet Count: 418 10*3/uL — ABNORMAL HIGH (ref 150–400)
RBC: 3.99 MIL/uL (ref 3.87–5.11)
RDW: 13.2 % (ref 11.5–15.5)
WBC Count: 11.4 10*3/uL — ABNORMAL HIGH (ref 4.0–10.5)
nRBC: 0 % (ref 0.0–0.2)

## 2024-01-19 NOTE — Telephone Encounter (Signed)
 I received a voicemail from Alicia Robinson to reschedule her lab appointment. I called Alicia Robinson back and she requested her lab to be rescheduled as soon as possible. I added her on the schedule for today at 2pm.

## 2024-01-20 ENCOUNTER — Encounter: Payer: Self-pay | Admitting: Nurse Practitioner

## 2024-01-20 ENCOUNTER — Other Ambulatory Visit: Payer: Self-pay

## 2024-01-23 ENCOUNTER — Other Ambulatory Visit: Payer: Self-pay

## 2024-01-26 ENCOUNTER — Inpatient Hospital Stay: Payer: BC Managed Care – PPO

## 2024-02-16 ENCOUNTER — Other Ambulatory Visit: Payer: Self-pay

## 2024-02-16 ENCOUNTER — Other Ambulatory Visit (HOSPITAL_COMMUNITY): Payer: Self-pay

## 2024-02-16 ENCOUNTER — Encounter: Payer: Self-pay | Admitting: Pharmacist

## 2024-02-19 ENCOUNTER — Other Ambulatory Visit: Payer: Self-pay

## 2024-02-24 ENCOUNTER — Other Ambulatory Visit (HOSPITAL_COMMUNITY): Payer: Self-pay

## 2024-02-25 ENCOUNTER — Other Ambulatory Visit (HOSPITAL_COMMUNITY): Payer: Self-pay

## 2024-02-25 ENCOUNTER — Other Ambulatory Visit: Payer: Self-pay | Admitting: Nurse Practitioner

## 2024-02-25 ENCOUNTER — Other Ambulatory Visit: Payer: Self-pay

## 2024-02-25 MED ORDER — HYDROXYUREA 500 MG PO CAPS
500.0000 mg | ORAL_CAPSULE | Freq: Every day | ORAL | 2 refills | Status: DC
  Filled 2024-02-25: qty 30, 30d supply, fill #0
  Filled 2024-03-21: qty 30, 30d supply, fill #1
  Filled 2024-04-13 – 2024-04-14 (×2): qty 30, 30d supply, fill #2

## 2024-03-01 ENCOUNTER — Encounter

## 2024-03-11 ENCOUNTER — Other Ambulatory Visit (HOSPITAL_COMMUNITY): Payer: Self-pay

## 2024-03-22 ENCOUNTER — Other Ambulatory Visit (HOSPITAL_COMMUNITY): Payer: Self-pay

## 2024-03-29 ENCOUNTER — Other Ambulatory Visit: Payer: BC Managed Care – PPO

## 2024-03-29 ENCOUNTER — Ambulatory Visit: Payer: BC Managed Care – PPO | Admitting: Nurse Practitioner

## 2024-04-13 ENCOUNTER — Other Ambulatory Visit: Payer: Self-pay

## 2024-04-14 ENCOUNTER — Other Ambulatory Visit: Payer: Self-pay

## 2024-04-15 ENCOUNTER — Inpatient Hospital Stay (HOSPITAL_BASED_OUTPATIENT_CLINIC_OR_DEPARTMENT_OTHER): Admitting: Nurse Practitioner

## 2024-04-15 ENCOUNTER — Other Ambulatory Visit: Payer: Self-pay

## 2024-04-15 ENCOUNTER — Inpatient Hospital Stay

## 2024-04-15 ENCOUNTER — Ambulatory Visit: Payer: Self-pay | Admitting: Family Medicine

## 2024-04-15 ENCOUNTER — Encounter: Payer: Self-pay | Admitting: Nurse Practitioner

## 2024-04-15 ENCOUNTER — Inpatient Hospital Stay: Attending: Nurse Practitioner

## 2024-04-15 VITALS — BP 118/80 | HR 71 | Temp 97.3°F | Resp 17 | Wt 186.1 lb

## 2024-04-15 DIAGNOSIS — D473 Essential (hemorrhagic) thrombocythemia: Secondary | ICD-10-CM | POA: Insufficient documentation

## 2024-04-15 DIAGNOSIS — R0789 Other chest pain: Secondary | ICD-10-CM | POA: Diagnosis not present

## 2024-04-15 DIAGNOSIS — E559 Vitamin D deficiency, unspecified: Secondary | ICD-10-CM | POA: Diagnosis not present

## 2024-04-15 DIAGNOSIS — Z8673 Personal history of transient ischemic attack (TIA), and cerebral infarction without residual deficits: Secondary | ICD-10-CM | POA: Diagnosis not present

## 2024-04-15 DIAGNOSIS — D471 Chronic myeloproliferative disease: Secondary | ICD-10-CM | POA: Insufficient documentation

## 2024-04-15 DIAGNOSIS — D75839 Thrombocytosis, unspecified: Secondary | ICD-10-CM

## 2024-04-15 DIAGNOSIS — E7800 Pure hypercholesterolemia, unspecified: Secondary | ICD-10-CM | POA: Diagnosis not present

## 2024-04-15 LAB — CMP (CANCER CENTER ONLY)
ALT: 28 U/L (ref 0–44)
AST: 22 U/L (ref 15–41)
Albumin: 4.2 g/dL (ref 3.5–5.0)
Alkaline Phosphatase: 45 U/L (ref 38–126)
Anion gap: 5 (ref 5–15)
BUN: 17 mg/dL (ref 6–20)
CO2: 28 mmol/L (ref 22–32)
Calcium: 9.2 mg/dL (ref 8.9–10.3)
Chloride: 105 mmol/L (ref 98–111)
Creatinine: 0.95 mg/dL (ref 0.44–1.00)
GFR, Estimated: >60 mL/min (ref >60)
Glucose, Bld: 90 mg/dL (ref 70–99)
Potassium: 3.9 mmol/L (ref 3.5–5.1)
Sodium: 138 mmol/L (ref 135–145)
Total Bilirubin: 0.5 mg/dL (ref 0.0–1.2)
Total Protein: 7 g/dL (ref 6.5–8.1)

## 2024-04-15 LAB — CBC WITH DIFFERENTIAL (CANCER CENTER ONLY)
Abs Immature Granulocytes: 0.02 K/uL (ref 0.00–0.07)
Basophils Absolute: 0.1 K/uL (ref 0.0–0.1)
Basophils Relative: 1 %
Eosinophils Absolute: 0.1 K/uL (ref 0.0–0.5)
Eosinophils Relative: 2 %
HCT: 39.4 % (ref 36.0–46.0)
Hemoglobin: 13.7 g/dL (ref 12.0–15.0)
Immature Granulocytes: 0 %
Lymphocytes Relative: 32 %
Lymphs Abs: 2.4 K/uL (ref 0.7–4.0)
MCH: 32.8 pg (ref 26.0–34.0)
MCHC: 34.8 g/dL (ref 30.0–36.0)
MCV: 94.3 fL (ref 80.0–100.0)
Monocytes Absolute: 0.7 K/uL (ref 0.1–1.0)
Monocytes Relative: 9 %
Neutro Abs: 4.2 K/uL (ref 1.7–7.7)
Neutrophils Relative %: 56 %
Platelet Count: 386 K/uL (ref 150–400)
RBC: 4.18 MIL/uL (ref 3.87–5.11)
RDW: 14.1 % (ref 11.5–15.5)
WBC Count: 7.5 K/uL (ref 4.0–10.5)
nRBC: 0 % (ref 0.0–0.2)

## 2024-04-15 LAB — LIPID PANEL
Cholesterol: 273 mg/dL — ABNORMAL HIGH (ref 0–200)
HDL: 78 mg/dL (ref >40)
LDL Cholesterol: 184 mg/dL — ABNORMAL HIGH (ref 0–99)
Total CHOL/HDL Ratio: 3.5 ratio
Triglycerides: 54 mg/dL (ref <150)
VLDL: 11 mg/dL (ref 0–40)

## 2024-04-15 LAB — VITAMIN D 25 HYDROXY (VIT D DEFICIENCY, FRACTURES): Vit D, 25-Hydroxy: 92.16 ng/mL (ref 30–100)

## 2024-04-15 MED ORDER — ATORVASTATIN CALCIUM 40 MG PO TABS
40.0000 mg | ORAL_TABLET | Freq: Every day | ORAL | 1 refills | Status: AC
  Filled 2024-04-15: qty 90, 90d supply, fill #0
  Filled 2024-06-04: qty 30, 30d supply, fill #0
  Filled 2024-06-29: qty 30, 30d supply, fill #1

## 2024-04-15 NOTE — Progress Notes (Signed)
 [facility]   [phone] [phone]    Patient Care Team: Delbert Clam, MD as PCP - General (Family Medicine) Denetria Luevanos K, NP as Nurse Practitioner (Hematology and Oncology)   CHIEF COMPLAINT: Follow up ET  CURRENT THERAPY: Hydrea  500 mg po daily  INTERVAL HISTORY Alicia Robinson returns for follow up as scheduled. Last seen by me 11/10/23. Doing well overall, no significant changes in her health. Completed 6 months plavix  and also stopped lipitor. Denies signs of recurrent stroke/thrombosis. Tolerating Hydrea . Gets a headache at times when exercising, no associated weakness, blurred vision, dizziness, chest pain or other acute changes. Has intermittent left neck pain especially when raising her arm. Notes she lifts weights at the gym.   ROS  All other systems reviewed and negative  Past Medical History:  Diagnosis Date   Anxiety    Arthritis    Cancer (HCC) Feb26, 2024   Et   Depression    Drug reaction 10/22/2022   GERD (gastroesophageal reflux disease)    Hyperlipidemia    Hypertension    Stroke (HCC) 2016   mild   Suicidal overdose (HCC)      Past Surgical History:  Procedure Laterality Date   CHOLECYSTECTOMY     SIGMOIDOSCOPY     pt is unsure about this   TUBAL LIGATION     UPPER GASTROINTESTINAL ENDOSCOPY       Outpatient Encounter Medications as of 04/15/2024  Medication Sig   ferrous sulfate  324 MG TBEC Take 1 tablet (324 mg total) by mouth daily with breakfast.   hydroxyurea  (HYDREA ) 500 MG capsule Take 1 capsule (500 mg total) by mouth daily.   Multiple Vitamins-Minerals (HAIR SKIN AND NAILS FORMULA) TABS Take 1 tablet by mouth daily.   omeprazole  (PRILOSEC) 20 MG capsule Take 1 capsule (20 mg total) by mouth daily.   Vitamin D , Ergocalciferol , (DRISDOL ) 1.25 MG (50000 UNIT) CAPS capsule Take 1 capsule (50,000 Units total) by mouth every 7 (seven) days.   [DISCONTINUED] atorvastatin  (LIPITOR) 40 MG tablet Take 1  tablet (40 mg total) by mouth daily.   [DISCONTINUED] clopidogrel  (PLAVIX ) 75 MG tablet Take 1 tablet (75 mg total) by mouth daily.   [DISCONTINUED] diclofenac  Sodium (VOLTAREN ) 1 % GEL Apply 4 g topically 4 (four) times daily.   No facility-administered encounter medications on file as of 04/15/2024.     Today's Vitals   04/15/24 1244  BP: 118/80  Pulse: 71  Resp: 17  Temp: (!) 97.3 F (36.3 C)  SpO2: 96%  Weight: 186 lb 1.6 oz (84.4 kg)   Body mass index is 26.7 kg/m.   ECOG PERFORMANCE STATUS: 0 - Asymptomatic  PHYSICAL EXAM GENERAL:alert, no distress and comfortable SKIN: no rash  EYES: sclera clear LUNGS: clear with normal breathing effort HEART: regular rate & rhythm, no lower extremity edema ABDOMEN: abdomen soft, non-tender and normal bowel sounds NEURO: alert & oriented x 3 with fluent speech, no focal motor/sensory deficits   CBC    Latest Ref Rng & Units 04/15/2024   12:24 PM 01/19/2024    2:28 PM 11/10/2023   10:32 AM  CBC  WBC 4.0 - 10.5 K/uL 7.5  11.4  7.0   Hemoglobin 12.0 - 15.0 g/dL 86.2  86.6  86.3   Hematocrit 36.0 - 46.0 % 39.4  37.8  38.7   Platelets 150 - 400 K/uL 386  418  369       CMP  Latest Ref Rng & Units 04/15/2024   12:24 PM 11/10/2023   10:32 AM 10/23/2023    9:49 AM  CMP  Glucose 70 - 99 mg/dL 90  852  899   BUN 6 - 20 mg/dL 17  11  15    Creatinine 0.44 - 1.00 mg/dL 9.04  9.00  9.09   Sodium 135 - 145 mmol/L 138  138  140   Potassium 3.5 - 5.1 mmol/L 3.9  4.3  4.8   Chloride 98 - 111 mmol/L 105  107  101   CO2 22 - 32 mmol/L 28  26  20    Calcium  8.9 - 10.3 mg/dL 9.2  9.1  9.4   Total Protein 6.5 - 8.1 g/dL 7.0  7.1  6.4   Total Bilirubin 0.0 - 1.2 mg/dL 0.5  0.5  0.4   Alkaline Phos 38 - 126 U/L 45  55  83   AST 15 - 41 U/L 22  42  38   ALT 0 - 44 U/L 28  63  75       ASSESSMENT & PLAN:48 year old female   MPN/Essential Thrombocythemia (ET), 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 WBC range 11.1 - 16.2, with predominant neutrophils, and platelet range 429 - 611  -10/30/22 iron panel was normal. She is on oral iron for perimenopausal bleeding which is heavy at times -Further work up showed JAK2 Val617Phe, which confirms essential thrombocythemia/MPN -ABD US  showed small lesion on the spleen which is favored benign hemangioma. She has no other abd imaging in our system but reportedly had US  done in [place] earlier in 2024; I don't have access and the report is in Spanish which she can't translate at this time. We plan to monitor with repeat ABD US  in 6 -12 months - due now -She began Hydrea  500 mg starting 11/13/22, currently once daily -[name] appears stable, continues Hydrea , tolerating well with no signs of AEs or thrombosis. CBC/CMP wnl today, continue same day -Lab q3 months, f/up in 6 months    2. Atypical chest pain, h/o CVA -She had a stroke in 2016 at age 62, details are unclear whether hemorrhagic vs ischemic. She was on aspirin  for 6 months then stopped. No residual deficits -She reports intermittent left chest pain, non-exertional -She went to ED in 06/2022 for this pain, work up was unremarkable.  -10/30/22 exam is benign -F/up PCP  -possible that ET may have been the cause of her stroke, given the high risk of thrombosis with ET -She continues to have intermittent chest pain, seen in ED 11/24/22, work up was unremarkable. I previously referred her to cardiology -She had second stroke in 09/2023, she has recovered except mild residual headaches. F/up neurology 3/12. She is not taking aspirin  and will run out of plavix  before the appt, I will cc my note -Not felt to be related to ET and hypercoagulable panel was negative/normal, antiphospholipid syndrome is ruled out -She completed plavix  and stopped lipitor. Checking lipids per her request and will cc PCP   3.  Age-appropriate health maintenance -I encouraged her to continue age appropriate health maintenance and cancer screenings, avoid smoking/alcohol, and exercise.  -She is overdue for first screening colonoscopy. Per PCP -vit D level today per pt request    PLAN: -Labs reviewed, add on vit D and lipid per pt request -Continue Hydrea  500 mg  po daily -Left neck pain likely MSK, symptom management  -Lab q3 months -F/up in 6 months, or sooner if needed   Orders Placed This Encounter  Procedures   Vitamin D  25 hydroxy    Standing Status:   Future    Expiration Date:   04/15/2025   Lipid panel    Standing Status:   Future    Expiration Date:   04/15/2025      All questions were answered. The patient knows to call the clinic with any problems, questions or concerns. No barriers to learning were detected.   Damani Kelemen K Jerian Morais, NP 04/15/2024

## 2024-04-26 ENCOUNTER — Inpatient Hospital Stay: Payer: BC Managed Care – PPO

## 2024-04-26 ENCOUNTER — Inpatient Hospital Stay: Payer: BC Managed Care – PPO | Admitting: Nurse Practitioner

## 2024-06-02 ENCOUNTER — Other Ambulatory Visit: Payer: Self-pay | Admitting: Critical Care Medicine

## 2024-06-02 ENCOUNTER — Other Ambulatory Visit (HOSPITAL_COMMUNITY): Payer: Self-pay

## 2024-06-02 ENCOUNTER — Telehealth: Payer: Self-pay | Admitting: Nurse Practitioner

## 2024-06-02 ENCOUNTER — Other Ambulatory Visit: Payer: Self-pay

## 2024-06-02 ENCOUNTER — Other Ambulatory Visit: Payer: Self-pay | Admitting: Nurse Practitioner

## 2024-06-02 MED ORDER — HYDROXYUREA 500 MG PO CAPS
500.0000 mg | ORAL_CAPSULE | Freq: Every day | ORAL | 2 refills | Status: DC
  Filled 2024-06-02: qty 30, 30d supply, fill #0
  Filled 2024-06-28: qty 30, 30d supply, fill #1
  Filled 2024-07-25: qty 30, 30d supply, fill #2

## 2024-06-02 NOTE — Telephone Encounter (Signed)
 Called pt and scheduled her  appt and she is aware and confirm.

## 2024-06-03 ENCOUNTER — Other Ambulatory Visit (HOSPITAL_COMMUNITY): Payer: Self-pay

## 2024-06-03 ENCOUNTER — Encounter (HOSPITAL_COMMUNITY): Payer: Self-pay

## 2024-06-03 ENCOUNTER — Other Ambulatory Visit: Payer: Self-pay | Admitting: Nurse Practitioner

## 2024-06-03 DIAGNOSIS — D473 Essential (hemorrhagic) thrombocythemia: Secondary | ICD-10-CM

## 2024-06-03 NOTE — Assessment & Plan Note (Addendum)
 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 WBC range 11.1 - 16.2, with predominant neutrophils, and platelet range 429 - 611  -10/30/22 iron panel was normal. She is on oral iron for perimenopausal bleeding which is heavy at times -Further work up showed JAK2 Val617Phe, which confirms essential thrombocythemia/MPN -ABD US  showed small lesion on the spleen which is favored benign hemangioma. She has no other abd imaging in our system but reportedly had US  done in [place] earlier in 2024; I don't have access and the report is in Spanish which she can't translate at this time. We plan to monitor with repeat ABD US  in 6 -12 months -She began Hydrea  500 mg starting 11/13/22, currently once daily -Alicia Robinson appears stable, continues Hydrea , tolerating well with no signs of AEs or thrombosis. CBC/CMP wnl today, continue same day -Lab q3 months, f/up in 6 months

## 2024-06-03 NOTE — Progress Notes (Signed)
 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 WBC range 11.1 - 16.2, with predominant neutrophils, and platelet range 429 - 611  -10/30/22 iron panel was normal. She is on oral iron for perimenopausal bleeding which is heavy at times -Further work up showed JAK2 Val617Phe, which confirms essential thrombocythemia/MPN -ABD US  showed small lesion on the spleen which is favored benign hemangioma. She has no other abd imaging in our system but reportedly had US  done in [place] earlier in 2024; I don't have access and the report is in Spanish which she can't translate at this time. We plan to monitor with repeat ABD US  in 6 -12 months -She began Hydrea  500 mg starting 11/13/22, currently once daily -Ms. Barona appears stable, continues Hydrea , tolerating well with no signs of AEs or thrombosis. CBC/CMP wnl today, continue same day -Lab q3 months, f/up in 6 months    Yeast infection Patient describes recent vaginal discharge. Has a mild odor. Discharge is white and clumpy in consistency. Symptoms started yesterday. Denies itching. States that she was recently in her home [place] and spent time at the beach in wet bathing suit. She is concerned about yeast infection. Will send in prescription for diflucan  150 mg. Advised she take this once. May repeat dose in 3 days for persistent symptoms.   Essential polycythemia Currently on hydrea  500 mg daily. CBC and CMP both within normal limits today. Experiences only mild fatigue with the medication. Continue to take daily. Recheck labs every 3 months and  follow up with labs in 6 months.   Plan Labs reviewed.  -unremarkable CBC and CMP Continue hydrea  500 mg daily Call in diflucan  to treat yeast infection  Repeat labs every 3 months  Follow up with labs in 6 months.  The patient understands the plans discussed today and is in agreement with them.  She knows to contact our office if she develops concerns prior to her next appointment.  I provided 20 minutes of face-to-face time during this encounter and > 50% was spent counseling as documented under my assessment and plan.    Powell FORBES Lessen, NP  [facility] - A DEPT OF [facility] [address] [place] [address] Dept: [phone] Dept Fax: [phone]   No orders of the defined types were placed in this encounter.     CHIEF COMPLAINT:  CC: Essential thrombocythemia  Current Treatment: Hydrea  500 mg daily  INTERVAL HISTORY:  Alicia Robinson is here today for repeat clinical assessment.  She last saw  Lacie, NP, on 04/15/2024.  Has been tolerating Hydrea  well.  Has noted only mild fatigue. Reports some mild neck pain since yesterday. Feels like she may have slept wrong. She denies chest pain, chest pressure, or shortness of breath. She denies headaches or visual disturbances. She denies abdominal pain, nausea, vomiting, or changes in bowel or bladder habits. Patient describes recent vaginal discharge. Has a mild odor. Discharge is white and clumpy in consistency. Symptoms started yesterday. Denies itching. States that she was recently in [place] and spent time at the  beach in wet bathing suit She denies fevers or chills. She denies pain. Her appetite is good. Her weight has decreased 14 pounds over last 5 months.  I have reviewed the past medical history, past surgical history, social history and family history with the patient and they are unchanged from previous note.  ALLERGIES:  is allergic to bupropion ,  aspirin , and nsaids.  MEDICATIONS:  Current Outpatient Medications  Medication Sig Dispense Refill   fluconazole  (DIFLUCAN ) 150 MG tablet Take 1 tablet by mouth once. May repeat the dose in 3 days for persistent symptoms. 3 tablet 0   atorvastatin  (LIPITOR) 40 MG tablet Take 1 tablet (40 mg total) by mouth daily. 90 tablet 1   ferrous sulfate  324 MG TBEC Take 1 tablet (324 mg total) by mouth daily with breakfast. 30 tablet 3   hydroxyurea  (HYDREA ) 500 MG capsule Take 1 capsule (500 mg total) by mouth daily. 30 capsule 2   Multiple Vitamins-Minerals (HAIR SKIN AND NAILS FORMULA) TABS Take 1 tablet by mouth daily.     omeprazole  (PRILOSEC) 20 MG capsule Take 1 capsule (20 mg total) by mouth daily. 90 capsule 1   Vitamin D , Ergocalciferol , (DRISDOL ) 1.25 MG (50000 UNIT) CAPS capsule Take 1 capsule (50,000 Units total) by mouth every 7 (seven) days. 14 capsule 3   No current facility-administered medications for this visit.      REVIEW OF SYSTEMS:   Constitutional: Denies fevers, chills or abnormal weight loss Eyes: Denies blurriness of vision Ears, nose, mouth, throat, and face: Denies mucositis or sore throat Respiratory: Denies cough, dyspnea or wheezes Cardiovascular: Denies palpitation, chest discomfort or lower extremity swelling Gastrointestinal:  Denies nausea, heartburn or change in bowel habits Skin: Denies abnormal skin rashes Lymphatics: Denies new lymphadenopathy or easy bruising Neurological:Denies numbness, tingling or new weaknesses Behavioral/Psych: Mood is stable, no new changes  All other systems were reviewed with the patient and are negative.   VITALS:   Today's Vitals   06/04/24 0852  BP: 126/80  Pulse: 78  Resp: 15  Temp: 98.1 F (36.7 C)  TempSrc: Temporal  SpO2: 98%  Weight: 190 lb (86.2 kg)  Height: 5' 10 (1.778 m)   Body mass index is 27.26 kg/m.   Wt Readings from Last 3 Encounters:  06/04/24 190 lb (86.2 kg)  04/15/24 186 lb 1.6 oz (84.4  kg)  12/22/23 204 lb 6.4 oz (92.7 kg)    Body mass index is 27.26 kg/m.  Performance status (ECOG): 1 - Symptomatic but completely ambulatory  PHYSICAL EXAM:   GENERAL:alert, no distress and comfortable SKIN: skin color, texture, turgor are normal, no rashes or significant lesions EYES: normal, Conjunctiva are pink and non-injected, sclera clear OROPHARYNX:no exudate, no erythema and lips, buccal mucosa, and tongue normal  NECK: supple, thyroid normal size, non-tender, without nodularity LYMPH:  no palpable lymphadenopathy in the cervical, axillary or inguinal LUNGS: clear to auscultation and percussion with normal breathing effort HEART: regular rate & rhythm and no murmurs and no lower extremity edema ABDOMEN:abdomen soft, non-tender and normal bowel sounds Musculoskeletal:no cyanosis of digits and no clubbing  NEURO: alert & oriented x 3 with fluent speech, no focal motor/sensory deficits  LABORATORY DATA:  I have reviewed the data as listed    Component Value Date/Time   NA 138 06/04/2024 0816   NA 140 10/23/2023 0949   K 4.4 06/04/2024 0816   CL 104 06/04/2024 0816   CO2 30 06/04/2024 0816   GLUCOSE 90 06/04/2024 0816   BUN  19 06/04/2024 0816   BUN 15 10/23/2023 0949   CREATININE 1.00 06/04/2024 0816   CALCIUM  9.2 06/04/2024 0816   PROT 7.0 06/04/2024 0816   PROT 6.4 10/23/2023 0949   ALBUMIN 4.2 06/04/2024 0816   ALBUMIN 4.1 10/23/2023 0949   AST 24 06/04/2024 0816   ALT 29 06/04/2024 0816   ALKPHOS 49 06/04/2024 0816   BILITOT 0.6 06/04/2024 0816   GFRNONAA >60 06/04/2024 0816   GFRAA >60 06/07/2020 1541    Lab Results  Component Value Date   WBC 6.4 06/04/2024   NEUTROABS 3.2 06/04/2024   HGB 14.1 06/04/2024   HCT 40.4 06/04/2024   MCV 95.3 06/04/2024   PLT 341 06/04/2024

## 2024-06-04 ENCOUNTER — Other Ambulatory Visit: Payer: Self-pay

## 2024-06-04 ENCOUNTER — Other Ambulatory Visit (HOSPITAL_COMMUNITY): Payer: Self-pay

## 2024-06-04 ENCOUNTER — Other Ambulatory Visit: Attending: Nurse Practitioner

## 2024-06-04 ENCOUNTER — Inpatient Hospital Stay (HOSPITAL_BASED_OUTPATIENT_CLINIC_OR_DEPARTMENT_OTHER): Admitting: Nurse Practitioner

## 2024-06-04 VITALS — BP 126/80 | HR 78 | Temp 98.1°F | Resp 15 | Ht 70.0 in | Wt 190.0 lb

## 2024-06-04 DIAGNOSIS — D473 Essential (hemorrhagic) thrombocythemia: Secondary | ICD-10-CM

## 2024-06-04 DIAGNOSIS — D751 Secondary polycythemia: Secondary | ICD-10-CM | POA: Insufficient documentation

## 2024-06-04 DIAGNOSIS — Z7964 Long term (current) use of myelosuppressive agent: Secondary | ICD-10-CM | POA: Insufficient documentation

## 2024-06-04 DIAGNOSIS — D75839 Thrombocytosis, unspecified: Secondary | ICD-10-CM | POA: Diagnosis not present

## 2024-06-04 DIAGNOSIS — D1800 Hemangioma unspecified site: Secondary | ICD-10-CM | POA: Diagnosis not present

## 2024-06-04 DIAGNOSIS — B379 Candidiasis, unspecified: Secondary | ICD-10-CM | POA: Insufficient documentation

## 2024-06-04 LAB — CBC WITH DIFFERENTIAL (CANCER CENTER ONLY)
Abs Immature Granulocytes: 0.03 K/uL (ref 0.00–0.07)
Basophils Absolute: 0.1 K/uL (ref 0.0–0.1)
Basophils Relative: 1 %
Eosinophils Absolute: 0.3 K/uL (ref 0.0–0.5)
Eosinophils Relative: 4 %
HCT: 40.4 % (ref 36.0–46.0)
Hemoglobin: 14.1 g/dL (ref 12.0–15.0)
Immature Granulocytes: 1 %
Lymphocytes Relative: 32 %
Lymphs Abs: 2 K/uL (ref 0.7–4.0)
MCH: 33.3 pg (ref 26.0–34.0)
MCHC: 34.9 g/dL (ref 30.0–36.0)
MCV: 95.3 fL (ref 80.0–100.0)
Monocytes Absolute: 0.8 K/uL (ref 0.1–1.0)
Monocytes Relative: 13 %
Neutro Abs: 3.2 K/uL (ref 1.7–7.7)
Neutrophils Relative %: 49 %
Platelet Count: 341 K/uL (ref 150–400)
RBC: 4.24 MIL/uL (ref 3.87–5.11)
RDW: 13.7 % (ref 11.5–15.5)
WBC Count: 6.4 K/uL (ref 4.0–10.5)
nRBC: 0 % (ref 0.0–0.2)

## 2024-06-04 LAB — CMP (CANCER CENTER ONLY)
ALT: 29 U/L (ref 0–44)
AST: 24 U/L (ref 15–41)
Albumin: 4.2 g/dL (ref 3.5–5.0)
Alkaline Phosphatase: 49 U/L (ref 38–126)
Anion gap: 4 — ABNORMAL LOW (ref 5–15)
BUN: 19 mg/dL (ref 6–20)
CO2: 30 mmol/L (ref 22–32)
Calcium: 9.2 mg/dL (ref 8.9–10.3)
Chloride: 104 mmol/L (ref 98–111)
Creatinine: 1 mg/dL (ref 0.44–1.00)
GFR, Estimated: >60 mL/min (ref >60)
Glucose, Bld: 90 mg/dL (ref 70–99)
Potassium: 4.4 mmol/L (ref 3.5–5.1)
Sodium: 138 mmol/L (ref 135–145)
Total Bilirubin: 0.6 mg/dL (ref 0.0–1.2)
Total Protein: 7 g/dL (ref 6.5–8.1)

## 2024-06-04 MED ORDER — FLUCONAZOLE 150 MG PO TABS
ORAL_TABLET | ORAL | 0 refills | Status: DC
  Filled 2024-06-04: qty 3, 9d supply, fill #0

## 2024-06-12 ENCOUNTER — Other Ambulatory Visit (HOSPITAL_COMMUNITY): Payer: Self-pay

## 2024-06-15 ENCOUNTER — Encounter: Payer: Self-pay | Admitting: Nurse Practitioner

## 2024-06-22 ENCOUNTER — Ambulatory Visit: Admitting: Family Medicine

## 2024-06-23 ENCOUNTER — Other Ambulatory Visit: Payer: Self-pay

## 2024-06-24 ENCOUNTER — Telehealth: Payer: Self-pay

## 2024-06-24 NOTE — Telephone Encounter (Signed)
 Notified the pt in regard to her FMLA forms being completed,faxed, and confirmation received. Pt copy will e mailed to her . Pt verbalized understanding.

## 2024-06-28 ENCOUNTER — Other Ambulatory Visit (HOSPITAL_COMMUNITY): Payer: Self-pay

## 2024-06-29 ENCOUNTER — Other Ambulatory Visit (HOSPITAL_COMMUNITY): Payer: Self-pay

## 2024-07-09 ENCOUNTER — Encounter: Payer: Self-pay | Admitting: Nurse Practitioner

## 2024-07-09 ENCOUNTER — Other Ambulatory Visit: Payer: Self-pay

## 2024-07-09 ENCOUNTER — Ambulatory Visit: Attending: Nurse Practitioner | Admitting: Nurse Practitioner

## 2024-07-09 VITALS — BP 118/79 | HR 63 | Ht 70.0 in | Wt 189.4 lb

## 2024-07-09 DIAGNOSIS — E7800 Pure hypercholesterolemia, unspecified: Secondary | ICD-10-CM

## 2024-07-09 DIAGNOSIS — R928 Other abnormal and inconclusive findings on diagnostic imaging of breast: Secondary | ICD-10-CM | POA: Diagnosis not present

## 2024-07-09 MED ORDER — FERROUS SULFATE 324 MG PO TBEC
324.0000 mg | DELAYED_RELEASE_TABLET | Freq: Every day | ORAL | 3 refills | Status: DC
  Filled 2024-07-09: qty 30, 30d supply, fill #0

## 2024-07-09 NOTE — Progress Notes (Signed)
 Assessment & Plan:  Alicia Robinson was seen today for hyperlipidemia.  Diagnoses and all orders for this visit:  Hypercholesterolemia Continue statin 40 mg daily as prescribed INSTRUCTIONS: Work on a low fat, heart healthy diet and participate in regular aerobic exercise program by working out at least 150 minutes per week; 5 days a week-30 minutes per day. Avoid red meat/beef/steak,  fried foods. junk foods, sodas, sugary drinks, unhealthy snacking, alcohol and smoking.  Drink at least 80 oz of water per day and monitor your carbohydrate intake daily.    Abnormal mammogram of right breast -     MM 3D DIAGNOSTIC MAMMOGRAM UNILATERAL RIGHT BREAST; Future     Patient has been counseled on age-appropriate routine health concerns for screening and prevention. These are reviewed and up-to-date. Referrals have been placed accordingly. Immunizations are up-to-date or declined.    Subjective:   Chief Complaint  Patient presents with   Hyperlipidemia    Alicia Robinson 48 y.o. female presents to office today for follow-up to hypercholesterolemia She is a patient of Dr. Delbert  Blood pressure is well-controlled.  She has hypertension in the past requiring antihypertensives which she was taken off due to normotensive readings without blood pressure medication BP Readings from Last 3 Encounters:  07/09/24 118/79  06/04/24 126/80  04/15/24 118/80     LDL not at goal with high intensity statin.   Lab Results  Component Value Date   LDLCALC 184 (H) 04/15/2024     She is overdue for diagnostic mammogram of the right breast.  We were able to get this scheduled for her for next month. Review of Systems  Constitutional:  Negative for fever, malaise/fatigue and weight loss.  HENT: Negative.  Negative for nosebleeds.   Eyes: Negative.  Negative for blurred vision, double vision and photophobia.  Respiratory: Negative.  Negative for cough and shortness of breath.   Cardiovascular: Negative.  Negative  for chest pain, palpitations and leg swelling.  Gastrointestinal: Negative.  Negative for heartburn, nausea and vomiting.  Musculoskeletal: Negative.  Negative for myalgias.  Neurological: Negative.  Negative for dizziness, focal weakness, seizures and headaches.  Psychiatric/Behavioral: Negative.  Negative for suicidal ideas.     Past Medical History:  Diagnosis Date   Anxiety    Arthritis    Cancer (HCC) Feb26, 2024   Et   Depression    Drug reaction 10/22/2022   GERD (gastroesophageal reflux disease)    Hyperlipidemia    Hypertension    Stroke (HCC) 2016   mild   Suicidal overdose (HCC)     Past Surgical History:  Procedure Laterality Date   CHOLECYSTECTOMY     SIGMOIDOSCOPY     pt is unsure about this   TUBAL LIGATION     UPPER GASTROINTESTINAL ENDOSCOPY      Family History  Problem Relation Age of Onset   Hypertension Mother    Diabetes Mother    Arthritis Mother    Depression Mother    Hypertension Father    Diabetes Father    Alcohol abuse Father    Diabetes Maternal Grandmother    Hypertension Maternal Grandmother    Miscarriages / Stillbirths Sister    Colon cancer Neg Hx    Esophageal cancer Neg Hx    Rectal cancer Neg Hx    Stomach cancer Neg Hx     Social History Reviewed with no changes to be made today.   Outpatient Medications Prior to Visit  Medication Sig Dispense Refill  atorvastatin  (LIPITOR) 40 MG tablet Take 1 tablet (40 mg total) by mouth daily. 90 tablet 1   hydroxyurea  (HYDREA ) 500 MG capsule Take 1 capsule (500 mg total) by mouth daily. 30 capsule 2   Multiple Vitamins-Minerals (HAIR SKIN AND NAILS FORMULA) TABS Take 1 tablet by mouth daily.     omeprazole  (PRILOSEC) 20 MG capsule Take 1 capsule (20 mg total) by mouth daily. 90 capsule 1   ferrous sulfate  324 MG TBEC Take 1 tablet (324 mg total) by mouth daily with breakfast. (Patient not taking: Reported on 07/09/2024) 30 tablet 3   Vitamin D , Ergocalciferol , (DRISDOL ) 1.25 MG  (50000 UNIT) CAPS capsule Take 1 capsule (50,000 Units total) by mouth every 7 (seven) days. (Patient not taking: Reported on 07/09/2024) 14 capsule 3   No facility-administered medications prior to visit.    Allergies  Allergen Reactions   Bupropion  Other (See Comments)    POLYURIAN   Aspirin      Kidney Damage   Nsaids     Renal insufficiency- no per pt       Objective:    BP 118/79 (BP Location: Left Arm, Patient Position: Sitting, Cuff Size: Normal)   Pulse 63   Ht 5' 10 (1.778 m)   Wt 189 lb 6.4 oz (85.9 kg)   SpO2 100%   BMI 27.18 kg/m  Wt Readings from Last 3 Encounters:  07/09/24 189 lb 6.4 oz (85.9 kg)  06/04/24 190 lb (86.2 kg)  04/15/24 186 lb 1.6 oz (84.4 kg)    Physical Exam Vitals and nursing note reviewed.  Constitutional:      Appearance: She is well-developed.  HENT:     Head: Normocephalic and atraumatic.  Cardiovascular:     Rate and Rhythm: Normal rate and regular rhythm.     Heart sounds: Normal heart sounds. No murmur heard.    No friction rub. No gallop.  Pulmonary:     Effort: Pulmonary effort is normal. No tachypnea or respiratory distress.     Breath sounds: Normal breath sounds. No decreased breath sounds, wheezing, rhonchi or rales.  Chest:     Chest wall: No tenderness.  Musculoskeletal:        General: Normal range of motion.     Cervical back: Normal range of motion.  Skin:    General: Skin is warm and dry.  Neurological:     Mental Status: She is alert and oriented to person, place, and time.     Coordination: Coordination normal.  Psychiatric:        Behavior: Behavior normal. Behavior is cooperative.        Thought Content: Thought content normal.        Judgment: Judgment normal.          Patient has been counseled extensively about nutrition and exercise as well as the importance of adherence with medications and regular follow-up. The patient was given clear instructions to go to ER or return to medical center if  symptoms don't improve, worsen or new problems develop. The patient verbalized understanding.   Follow-up: Return in about 4 months (around 11/09/2024).   Alicia LELON Servant, FNP-BC [facility] [place], [place] [phone]   07/09/2024, 10:21 AM

## 2024-07-21 ENCOUNTER — Encounter

## 2024-07-26 ENCOUNTER — Other Ambulatory Visit (HOSPITAL_COMMUNITY): Payer: Self-pay

## 2024-08-30 ENCOUNTER — Other Ambulatory Visit: Payer: Self-pay

## 2024-08-30 ENCOUNTER — Other Ambulatory Visit (HOSPITAL_COMMUNITY): Payer: Self-pay

## 2024-08-30 ENCOUNTER — Other Ambulatory Visit: Payer: Self-pay | Admitting: Nurse Practitioner

## 2024-08-30 ENCOUNTER — Inpatient Hospital Stay: Attending: Nurse Practitioner

## 2024-08-30 MED ORDER — HYDROXYUREA 500 MG PO CAPS
500.0000 mg | ORAL_CAPSULE | Freq: Every day | ORAL | 2 refills | Status: AC
  Filled 2024-08-30: qty 30, 30d supply, fill #0
  Filled 2024-09-27: qty 30, 30d supply, fill #1

## 2024-09-27 ENCOUNTER — Other Ambulatory Visit: Payer: Self-pay

## 2024-09-28 ENCOUNTER — Ambulatory Visit

## 2024-09-28 ENCOUNTER — Other Ambulatory Visit (HOSPITAL_COMMUNITY): Payer: Self-pay

## 2024-09-28 ENCOUNTER — Ambulatory Visit: Payer: Self-pay

## 2024-09-28 VITALS — BP 137/88 | HR 76 | Temp 98.5°F | Resp 16 | Ht 70.0 in | Wt 184.0 lb

## 2024-09-28 DIAGNOSIS — M542 Cervicalgia: Secondary | ICD-10-CM | POA: Diagnosis not present

## 2024-09-28 MED ORDER — METHOCARBAMOL 500 MG PO TABS
500.0000 mg | ORAL_TABLET | Freq: Four times a day (QID) | ORAL | 0 refills | Status: AC
  Filled 2024-09-28 (×2): qty 120, 30d supply, fill #0

## 2024-09-28 NOTE — Telephone Encounter (Signed)
 FYI Only or Action Required?: FYI only for provider: appointment scheduled on 1/13.  Patient was last seen in primary care on 07/09/2024 by Theotis Haze ORN, NP.  Called Nurse Triage reporting Numbness.  Symptoms began several days ago.  Interventions attempted: Rest, hydration, or home remedies.  Symptoms are: gradually worsening.  Triage Disposition: See HCP Within 4 Hours (Or PCP Triage)  Patient/caregiver understands and will follow disposition?: Yes  Copied from CRM [id number]. Topic: Clinical - Red Word Triage >> Sep 28, 2024 11:21 AM Wess RAMAN wrote: Red Word that prompted transfer to Nurse Triage: Right thumb goes numb. Numbness goes up right arm. Started out as pain. Reason for Disposition  [1] Numbness (i.e., loss of sensation) of the face, arm / hand, or leg / foot on one side of the body AND [2] gradual onset (e.g., days to weeks) AND [3] present now  Answer Assessment - Initial Assessment Questions Starting in September- Sleeping on diff bed for one month, Cramp on neck, Changed pillows, Shooting cramp down back and right arm Last 2 weeks has had tingling of the right thumb. The last 2 days she has had the tingling intermittently to the right arm and to the right pointer finger- denies sensation in the remaining 3 fingers. She does get pain intermittently as well to the right arm from shoulder to elbow.  Denies weakness, coordination changes, vision or speech changes, SOB, CP, Dizziness, or fever, or swelling, redness to the area.   January 2025- lost memory all day and when she came to they told her she had a stroke.   Acute visit to assess this afternoon with alternative office. Patient understands ED precautions    1. SYMPTOM: What is the main symptom you are concerned about? (e.g., weakness, numbness)     Tingling  2. ONSET: When did this start? (e.g., minutes, hours, days; while sleeping)     2 weeks 3. LAST NORMAL: When was the last time you (the patient)  were normal (no symptoms)?     2 weeks 4. PATTERN Does this come and go, or has it been constant since it started?  Is it present now?     Thumb tingling stays but the arm and other finger comes and goes  5. CARDIAC SYMPTOMS: Have you had any of the following symptoms: chest pain, difficulty breathing, palpitations?     Denies  6. NEUROLOGIC SYMPTOMS: Have you had any of the following symptoms: headache, dizziness, vision loss, double vision, changes in speech, unsteady on your feet?     Denies  7. OTHER SYMPTOMS: Do you have any other symptoms?     denies  Protocols used: Neurologic Deficit-A-AH

## 2024-09-28 NOTE — Telephone Encounter (Signed)
 Patient has appointment to discuss.

## 2024-09-28 NOTE — Progress Notes (Unsigned)
" ° ° ° °  Patient ID: Alicia Robinson, female    DOB: 12-04-1975  MRN: [id number]  CC: Medical Management of Chronic Issues (Patient c/o right side numbness and tingling that has been present since 09/08/2024,  4/10 pain)   Subjective: Alicia Robinson is a 49 y.o. female with past medical history of *** who presents to clinic for  Sept , pain in neck, back then arm. Dec 25th numb, right arm.   Allergies[1]  ROS: Review of Systems Negative except as stated above  PHYSICAL EXAM: BP 137/88   Pulse 76   Temp 98.5 F (36.9 C) (Oral)   Resp 16   Ht 5' 10 (1.778 m)   Wt 184 lb (83.5 kg)   SpO2 98%   BMI 26.40 kg/m   Physical Exam  General: well-appearing, no acute distress Skin: no jaundice, rashes, or lesions Cardiovascular: regular heart rate and rhythm, normal S1/S2, no murmurs, gallops, or rubs, peripheral pulses 2+ bilaterally Chest: no skeletal deformity, lungs clear to auscultation bilaterally, equal breath sounds bilaterally Abdomen: soft, non-distended, non-tender to palpation, no hepatomegaly, no splenomegaly, normoactive bowel sounds Musculoskeletal: normal gait Extremities: no peripheral edema  ASSESSMENT AND PLAN:  1. Neck pain (Primary) ***    Patient was given the opportunity to ask questions.  Patient verbalized understanding of the plan and was able to repeat key elements of the plan.    No orders of the defined types were placed in this encounter.    Requested Prescriptions    No prescriptions requested or ordered in this encounter    No follow-ups on file.  Sula Cower Jarah Pember, PA-C     [1]  Allergies Allergen Reactions   Bupropion  Other (See Comments)    POLYURIAN   Aspirin      Kidney Damage   Nsaids     Renal insufficiency- no per pt   "

## 2024-10-19 ENCOUNTER — Ambulatory Visit: Payer: Self-pay

## 2024-11-09 ENCOUNTER — Ambulatory Visit: Admitting: Family Medicine

## 2024-11-29 ENCOUNTER — Other Ambulatory Visit

## 2024-11-29 ENCOUNTER — Ambulatory Visit: Admitting: Hematology

## 2024-11-29 ENCOUNTER — Ambulatory Visit
